# Patient Record
Sex: Female | Born: 1970 | Race: White | Hispanic: No | Marital: Married | Smoking: Never smoker
Health system: Southern US, Community
[De-identification: ages and names within clinical notes are randomized; demographics above are authoritative.]

## PROBLEM LIST (undated history)

## (undated) DIAGNOSIS — G629 Polyneuropathy, unspecified: Secondary | ICD-10-CM

## (undated) DIAGNOSIS — M719 Bursopathy, unspecified: Secondary | ICD-10-CM

## (undated) DIAGNOSIS — F329 Major depressive disorder, single episode, unspecified: Secondary | ICD-10-CM

## (undated) DIAGNOSIS — H3581 Retinal edema: Secondary | ICD-10-CM

## (undated) DIAGNOSIS — M479 Spondylosis, unspecified: Secondary | ICD-10-CM

## (undated) DIAGNOSIS — Z9109 Other allergy status, other than to drugs and biological substances: Secondary | ICD-10-CM

## (undated) DIAGNOSIS — R569 Unspecified convulsions: Secondary | ICD-10-CM

## (undated) DIAGNOSIS — M1990 Unspecified osteoarthritis, unspecified site: Secondary | ICD-10-CM

## (undated) DIAGNOSIS — M779 Enthesopathy, unspecified: Secondary | ICD-10-CM

## (undated) DIAGNOSIS — M51369 Other intervertebral disc degeneration, lumbar region without mention of lumbar back pain or lower extremity pain: Secondary | ICD-10-CM

## (undated) DIAGNOSIS — E7800 Pure hypercholesterolemia, unspecified: Secondary | ICD-10-CM

## (undated) DIAGNOSIS — E119 Type 2 diabetes mellitus without complications: Secondary | ICD-10-CM

## (undated) DIAGNOSIS — Z86018 Personal history of other benign neoplasm: Secondary | ICD-10-CM

## (undated) DIAGNOSIS — M5136 Other intervertebral disc degeneration, lumbar region: Secondary | ICD-10-CM

## (undated) DIAGNOSIS — J40 Bronchitis, not specified as acute or chronic: Secondary | ICD-10-CM

## (undated) DIAGNOSIS — M7580 Other shoulder lesions, unspecified shoulder: Secondary | ICD-10-CM

## (undated) DIAGNOSIS — G43909 Migraine, unspecified, not intractable, without status migrainosus: Secondary | ICD-10-CM

## (undated) DIAGNOSIS — I10 Essential (primary) hypertension: Secondary | ICD-10-CM

## (undated) DIAGNOSIS — M797 Fibromyalgia: Secondary | ICD-10-CM

## (undated) DIAGNOSIS — M5126 Other intervertebral disc displacement, lumbar region: Secondary | ICD-10-CM

## (undated) DIAGNOSIS — R000 Tachycardia, unspecified: Secondary | ICD-10-CM

## (undated) DIAGNOSIS — Z8742 Personal history of other diseases of the female genital tract: Secondary | ICD-10-CM

## (undated) DIAGNOSIS — H539 Unspecified visual disturbance: Secondary | ICD-10-CM

## (undated) DIAGNOSIS — F32A Depression, unspecified: Secondary | ICD-10-CM

## (undated) DIAGNOSIS — F419 Anxiety disorder, unspecified: Secondary | ICD-10-CM

## (undated) DIAGNOSIS — T8859XA Other complications of anesthesia, initial encounter: Secondary | ICD-10-CM

## (undated) DIAGNOSIS — I509 Heart failure, unspecified: Secondary | ICD-10-CM

## (undated) DIAGNOSIS — G589 Mononeuropathy, unspecified: Secondary | ICD-10-CM

## (undated) DIAGNOSIS — J189 Pneumonia, unspecified organism: Secondary | ICD-10-CM

## (undated) DIAGNOSIS — D649 Anemia, unspecified: Secondary | ICD-10-CM

## (undated) HISTORY — DX: Major depressive disorder, single episode, unspecified: F32.9

## (undated) HISTORY — PX: TUBAL LIGATION: SHX77

## (undated) HISTORY — DX: Migraine, unspecified, not intractable, without status migrainosus: G43.909

## (undated) HISTORY — DX: Anxiety disorder, unspecified: F41.9

## (undated) HISTORY — PX: NASAL SINUS SURGERY: SHX719

## (undated) HISTORY — DX: Retinal edema: H35.81

## (undated) HISTORY — DX: Essential (primary) hypertension: I10

## (undated) HISTORY — DX: Polyneuropathy, unspecified: G62.9

## (undated) HISTORY — PX: VAGINAL HYSTERECTOMY: SUR661

## (undated) HISTORY — DX: Other shoulder lesions, unspecified shoulder: M75.80

## (undated) HISTORY — DX: Type 2 diabetes mellitus without complications: E11.9

## (undated) HISTORY — DX: Heart failure, unspecified: I50.9

## (undated) HISTORY — DX: Depression, unspecified: F32.A

## (undated) HISTORY — DX: Pure hypercholesterolemia, unspecified: E78.00

---

## 1999-01-16 ENCOUNTER — Emergency Department (HOSPITAL_COMMUNITY): Admission: EM | Admit: 1999-01-16 | Discharge: 1999-01-16 | Payer: Self-pay | Admitting: Emergency Medicine

## 1999-01-16 ENCOUNTER — Encounter: Payer: Self-pay | Admitting: Emergency Medicine

## 2001-06-18 ENCOUNTER — Encounter: Payer: Self-pay | Admitting: Emergency Medicine

## 2001-06-18 ENCOUNTER — Emergency Department (HOSPITAL_COMMUNITY): Admission: EM | Admit: 2001-06-18 | Discharge: 2001-06-18 | Payer: Self-pay

## 2001-07-05 ENCOUNTER — Emergency Department (HOSPITAL_COMMUNITY): Admission: EM | Admit: 2001-07-05 | Discharge: 2001-07-05 | Payer: Self-pay | Admitting: Emergency Medicine

## 2001-07-05 ENCOUNTER — Encounter: Payer: Self-pay | Admitting: Emergency Medicine

## 2001-07-06 ENCOUNTER — Encounter: Payer: Self-pay | Admitting: Emergency Medicine

## 2001-11-19 ENCOUNTER — Emergency Department (HOSPITAL_COMMUNITY): Admission: EM | Admit: 2001-11-19 | Discharge: 2001-11-19 | Payer: Self-pay | Admitting: Emergency Medicine

## 2001-11-19 ENCOUNTER — Encounter: Payer: Self-pay | Admitting: Emergency Medicine

## 2002-02-24 ENCOUNTER — Emergency Department (HOSPITAL_COMMUNITY): Admission: EM | Admit: 2002-02-24 | Discharge: 2002-02-24 | Payer: Self-pay | Admitting: Emergency Medicine

## 2002-09-24 ENCOUNTER — Encounter: Payer: Self-pay | Admitting: Emergency Medicine

## 2002-09-24 ENCOUNTER — Emergency Department (HOSPITAL_COMMUNITY): Admission: EM | Admit: 2002-09-24 | Discharge: 2002-09-24 | Payer: Self-pay | Admitting: Emergency Medicine

## 2003-01-10 ENCOUNTER — Encounter
Admission: RE | Admit: 2003-01-10 | Discharge: 2003-04-10 | Payer: Self-pay | Admitting: Physical Medicine & Rehabilitation

## 2003-04-05 ENCOUNTER — Encounter
Admission: RE | Admit: 2003-04-05 | Discharge: 2003-07-04 | Payer: Self-pay | Admitting: Physical Medicine & Rehabilitation

## 2003-08-08 ENCOUNTER — Encounter
Admission: RE | Admit: 2003-08-08 | Discharge: 2003-11-06 | Payer: Self-pay | Admitting: Physical Medicine & Rehabilitation

## 2013-03-21 ENCOUNTER — Ambulatory Visit (INDEPENDENT_AMBULATORY_CARE_PROVIDER_SITE_OTHER): Payer: BC Managed Care – PPO | Admitting: Endocrinology

## 2013-03-21 ENCOUNTER — Ambulatory Visit: Payer: BC Managed Care – PPO | Admitting: Endocrinology

## 2013-03-21 VITALS — BP 130/70 | HR 78 | Ht 65.0 in | Wt 157.0 lb

## 2013-03-21 DIAGNOSIS — M545 Low back pain, unspecified: Secondary | ICD-10-CM

## 2013-03-21 DIAGNOSIS — G609 Hereditary and idiopathic neuropathy, unspecified: Secondary | ICD-10-CM

## 2013-03-21 DIAGNOSIS — E109 Type 1 diabetes mellitus without complications: Secondary | ICD-10-CM | POA: Insufficient documentation

## 2013-03-21 DIAGNOSIS — E785 Hyperlipidemia, unspecified: Secondary | ICD-10-CM

## 2013-03-21 DIAGNOSIS — M159 Polyosteoarthritis, unspecified: Secondary | ICD-10-CM

## 2013-03-21 DIAGNOSIS — D751 Secondary polycythemia: Secondary | ICD-10-CM

## 2013-03-21 DIAGNOSIS — E1039 Type 1 diabetes mellitus with other diabetic ophthalmic complication: Secondary | ICD-10-CM

## 2013-03-21 DIAGNOSIS — I10 Essential (primary) hypertension: Secondary | ICD-10-CM

## 2013-03-21 DIAGNOSIS — R809 Proteinuria, unspecified: Secondary | ICD-10-CM

## 2013-03-21 DIAGNOSIS — H3552 Pigmentary retinal dystrophy: Secondary | ICD-10-CM

## 2013-03-21 MED ORDER — INSULIN ASPART 100 UNIT/ML FLEXPEN: PEN_INJECTOR | SUBCUTANEOUS | Status: DC

## 2013-03-21 MED ORDER — INSULIN DETEMIR 100 UNIT/ML FLEXPEN: 40.0000 [IU] | PEN_INJECTOR | Freq: Every day | SUBCUTANEOUS | Status: DC

## 2013-03-21 NOTE — Patient Instructions (Addendum)
good diet and exercise habits significanly improve the control of your diabetes.  please let me know if you wish to be referred to a dietician.  high blood sugar is very risky to your health.  you should see an eye doctor every year.  You are at higher than average risk for pneumonia and hepatitis-B.  You should be vaccinated against both.   controlling your blood pressure and cholesterol drastically reduces the damage diabetes does to your body.  this also applies to quitting smoking.  please discuss these with your doctor.  check your blood sugar twice a day.  vary the time of day when you check, between before the 3 meals, and at bedtime.  also check if you have symptoms of your blood sugar being too high or too low.  please keep a record of the readings and bring it to your next appointment here.  please call us sooner if your blood sugar goes below 70, or if you have a lot of readings over 200.   Please stop the victoza. Please take the insulin numbers noted below.   Please come back for a follow-up appointment in 2 weeks.

## 2013-03-21 NOTE — Progress Notes (Signed)
  Subjective:    Patient ID: Amanda Frederick, female    DOB: [birth year], 42 y.o.   MRN: [id number]  HPI pt states 26 years h/o dm.  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  she has been on insulin x 22 years.  pt says her diet is good, but exercise is poor.  She developed a rash after starting lantus and humalog.  It resolved when she stopped these.  However, these are preferred on her insurance.  She says she has intermittent mild hypoglycemia after taking novolog 15 units with breakfast or lunch.  It is highest at hs.  No past medical history on file.  No past surgical history on file.  History   Social History  . Marital Status: Divorced    Spouse Name: N/A    Number of Children: N/A  . Years of Education: N/A   Occupational History  . Not on file.   Social History Main Topics  . Smoking status: Not on file  . Smokeless tobacco: Not on file  . Alcohol Use: Not on file  . Drug Use: Not on file  . Sexual Activity: Not on file   Other Topics Concern  . Not on file   Social History Narrative  . No narrative on file    No current outpatient prescriptions on file prior to visit.   No current facility-administered medications on file prior to visit.    Not on File  No family history on file. DM: mother BP 130/70  Pulse 78  Ht 5\' 5"  (1.651 m)  Wt 157 lb (71.215 kg)  BMI 26.13 kg/m2  SpO2 97%  Review of Systems denies weight loss, sob, n/v, memory loss, menopausal sxs, rhinorrhea, and easy bruising.  Her vision is poor.  She has headache, excessive diaphoresis, muscle cramps, depression, and urinary frequency.  Dr Tomasa Blase recently evaluated her for chest pain, and determined it to be noncardiogenic.  She has decreased appetite since on victoza (last week).    Objective:   Physical Exam VS: see vs page GEN: no distress HEAD: head: no deformity eyes: no periorbital swelling, no proptosis external nose and ears are  normal mouth: no lesion seen NECK: supple, thyroid is not enlarged CHEST WALL: no deformity LUNGS:  Clear to auscultation CV: reg rate and rhythm, no murmur ABD: abdomen is soft, nontender.  no hepatosplenomegaly.  not distended.  no hernia MUSCULOSKELETAL: muscle bulk and strength are grossly normal.  no obvious joint swelling.  gait is normal and steady PULSES: no carotid bruit NEURO:  cn 2-12 grossly intact.   readily moves all 4's.   SKIN:  Normal texture and temperature.  No rash or suspicious lesion is visible.   NODES:  None palpable at the neck.  PSYCH: alert, oriented x3.  Does not appear anxious nor depressed.   outside test results are reviewed: A1c=12%    Assessment & Plan:  DM: i agree with dr Tomasa Blase that she has a very high risk to her health from this a1c. Rash: this is usually a coincidence, as it is a rare side effect of insulin.  We discussed retrial of lantus+humalog, but pt says she wants to stay with levemir+novolog for now.  Decreased appetite.  Pt says she wants to go off victoza, so that is ok with me.   Visual loss.  This limits the rx of DM.

## 2013-03-24 ENCOUNTER — Telehealth: Payer: Self-pay | Admitting: Endocrinology

## 2013-03-24 MED ORDER — INSULIN LISPRO 100 UNIT/ML ~~LOC~~ SOLN: SUBCUTANEOUS | Status: DC

## 2013-03-24 NOTE — Telephone Encounter (Signed)
i have changed novolog pens to humalog vials, per insurance. i need prior auth form for levemir.

## 2013-03-25 ENCOUNTER — Other Ambulatory Visit: Payer: Self-pay

## 2013-03-25 NOTE — Telephone Encounter (Signed)
Form on your desk  

## 2013-04-04 ENCOUNTER — Ambulatory Visit (INDEPENDENT_AMBULATORY_CARE_PROVIDER_SITE_OTHER): Payer: BC Managed Care – PPO | Admitting: Endocrinology

## 2013-04-04 VITALS — BP 134/80 | HR 110 | Ht 65.0 in | Wt 160.0 lb

## 2013-04-04 DIAGNOSIS — G619 Inflammatory polyneuropathy, unspecified: Secondary | ICD-10-CM | POA: Insufficient documentation

## 2013-04-04 DIAGNOSIS — E1039 Type 1 diabetes mellitus with other diabetic ophthalmic complication: Secondary | ICD-10-CM

## 2013-04-04 DIAGNOSIS — G622 Polyneuropathy due to other toxic agents: Secondary | ICD-10-CM | POA: Insufficient documentation

## 2013-04-04 NOTE — Patient Instructions (Addendum)
check your blood sugar 4 times a day.  vary the time of day when you check, between before the 3 meals, and at bedtime.  also check if you have symptoms of your blood sugar being too high or too low.  please keep a record of the readings and bring it to your next appointment here.  please call us sooner if your blood sugar goes below 70, or if you have a lot of readings over 200.   Please come back for a follow-up appointment in 2-3 weeks.   Please reduce the levemir to 35 units at bedtime, and: Change the novolog to 3 times a day (just before each meal) 15-15-20 units.   Dr Tomasa Blase or i would be happy to request for you a special kind of x-ray, to see if you have "gastroparesis."   Refer to a neurology specialist.  you will receive a phone call, about a day and time for an appointment.

## 2013-04-04 NOTE — Progress Notes (Signed)
  Subjective:    Patient ID: Amanda Frederick, female    DOB: Jan 11, 1971, 42 y.o.   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  she has been on insulin since 1992.  She developed a rash after starting lantus and humalog.  It resolved when she stopped these.  However, these are preferred on her insurance. she brings a record of her cbg's which i have reviewed today.  It varies from 70-300.   It is in general lowest at hs, and in am.  pt states she feels well in general. No past medical history on file.  No past surgical history on file.  History   Social History  . Marital Status: Divorced    Spouse Name: N/A    Number of Children: N/A  . Years of Education: N/A   Occupational History  . Not on file.   Social History Main Topics  . Smoking status: Not on file  . Smokeless tobacco: Not on file  . Alcohol Use: Not on file  . Drug Use: Not on file  . Sexual Activity: Not on file   Other Topics Concern  . Not on file   Social History Narrative  . No narrative on file    Current Outpatient Prescriptions on File Prior to Visit  Medication Sig Dispense Refill  . fish oil-omega-3 fatty acids 1000 MG capsule Take 2 g by mouth daily.      Marland Kitchen lisinopril (PRINIVIL,ZESTRIL) 10 MG tablet Take 10 mg by mouth daily.       No current facility-administered medications on file prior to visit.   Not on File  No family history on file.  BP 134/80  Pulse 110  Ht 5\' 5"  (1.651 m)  Wt 160 lb (72.576 kg)  BMI 26.63 kg/m2  SpO2 98%  Review of Systems Denies LOC and weight change.     Objective:   Physical Exam VITAL SIGNS:  See vs page GENERAL: no distress SKIN:  Insulin injection sites at the anterior abdomen are normal, except for a few ecchymoses.       Assessment & Plan:  DM: Based on the pattern of her cbg's, she needs some adjustment in her therapy Nausea: possibly due  to gastroparesis Peripheral painful neuropathy: this limits exercise of DM.

## 2013-04-18 ENCOUNTER — Ambulatory Visit (INDEPENDENT_AMBULATORY_CARE_PROVIDER_SITE_OTHER): Payer: BC Managed Care – PPO | Admitting: Endocrinology

## 2013-04-18 VITALS — BP 126/74 | HR 78 | Wt 163.0 lb

## 2013-04-18 DIAGNOSIS — E1039 Type 1 diabetes mellitus with other diabetic ophthalmic complication: Secondary | ICD-10-CM

## 2013-04-18 NOTE — Patient Instructions (Addendum)
check your blood sugar 4 times a day.  vary the time of day when you check, between before the 3 meals, and at bedtime.  also check if you have symptoms of your blood sugar being too high or too low.  please keep a record of the readings and bring it to your next appointment here.  please call us sooner if your blood sugar goes below 70, or if you have a lot of readings over 200.   Please come back for a follow-up appointment in 6 weeks.  Please continue the levemir, 35 units at bedtime, and:  Change the novolog to 3 times a day (just before each meal) 15-20-25 units.  Also, take 5 extra units whenever your blood sugar is over 250.

## 2013-04-18 NOTE — Progress Notes (Signed)
  Subjective:    Patient ID: Amanda Frederick, female    DOB: 09-Sep-1970, 42 y.o.   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 she brings a record of her cbg's which i have reviewed today.  It varies from 180-300.  It is in general higher as the day goes on.  pt states she feels well in general. No past medical history on file.  No past surgical history on file.  History   Social History  . Marital Status: Divorced    Spouse Name: N/A    Number of Children: N/A  . Years of Education: N/A   Occupational History  . Not on file.   Social History Main Topics  . Smoking status: Not on file  . Smokeless tobacco: Not on file  . Alcohol Use: Not on file  . Drug Use: Not on file  . Sexual Activity: Not on file   Other Topics Concern  . Not on file   Social History Narrative  . No narrative on file    Current Outpatient Prescriptions on File Prior to Visit  Medication Sig Dispense Refill  . fenofibrate 160 MG tablet Take 160 mg by mouth daily.      . fish oil-omega-3 fatty acids 1000 MG capsule Take 2 g by mouth daily.      . Insulin Detemir 100 UNIT/ML SOPN Inject 35 Units into the skin at bedtime.      Marland Kitchen lisinopril (PRINIVIL,ZESTRIL) 10 MG tablet Take 10 mg by mouth daily.       No current facility-administered medications on file prior to visit.    Not on File  No family history on file.  BP 126/74  Pulse 78  Wt 163 lb (73.936 kg)  BMI 27.12 kg/m2  SpO2 98%  Review of Systems denies hypoglycemia and weight change.      Objective:   Physical Exam VITAL SIGNS:  See vs page.   GENERAL: no distress. PSYCH: Alert and oriented x 3.  Does not appear anxious nor depressed.      Assessment & Plan:  DM:  Based on the pattern of her cbg's, she needs some adjustment in her therapy.  This insulin regimen was chosen from multiple options, as it best matches her insulin to her changing requirements throughout the day.  The benefits of glycemic control must be weighed against the risks of hypoglycemia.  She may need a simpler insulin regimen.   Rash: this limits her insulin options. Painful neuropathy: this limits exercise rx of DM.

## 2013-04-21 ENCOUNTER — Other Ambulatory Visit: Payer: Self-pay | Admitting: Endocrinology

## 2013-04-21 DIAGNOSIS — E1039 Type 1 diabetes mellitus with other diabetic ophthalmic complication: Secondary | ICD-10-CM

## 2013-04-22 ENCOUNTER — Other Ambulatory Visit: Payer: Self-pay | Admitting: Endocrinology

## 2013-04-22 ENCOUNTER — Telehealth: Payer: Self-pay | Admitting: Endocrinology

## 2013-04-22 ENCOUNTER — Ambulatory Visit (INDEPENDENT_AMBULATORY_CARE_PROVIDER_SITE_OTHER): Payer: BC Managed Care – PPO | Admitting: Neurology

## 2013-04-22 ENCOUNTER — Encounter: Payer: Self-pay | Admitting: Neurology

## 2013-04-22 VITALS — BP 124/84 | HR 106 | Ht 66.0 in | Wt 160.0 lb

## 2013-04-22 DIAGNOSIS — F32A Depression, unspecified: Secondary | ICD-10-CM | POA: Insufficient documentation

## 2013-04-22 DIAGNOSIS — F3289 Other specified depressive episodes: Secondary | ICD-10-CM

## 2013-04-22 DIAGNOSIS — M542 Cervicalgia: Secondary | ICD-10-CM

## 2013-04-22 DIAGNOSIS — G619 Inflammatory polyneuropathy, unspecified: Secondary | ICD-10-CM

## 2013-04-22 DIAGNOSIS — F329 Major depressive disorder, single episode, unspecified: Secondary | ICD-10-CM

## 2013-04-22 DIAGNOSIS — E1039 Type 1 diabetes mellitus with other diabetic ophthalmic complication: Secondary | ICD-10-CM

## 2013-04-22 DIAGNOSIS — E119 Type 2 diabetes mellitus without complications: Secondary | ICD-10-CM

## 2013-04-22 DIAGNOSIS — G609 Hereditary and idiopathic neuropathy, unspecified: Secondary | ICD-10-CM

## 2013-04-22 DIAGNOSIS — I10 Essential (primary) hypertension: Secondary | ICD-10-CM

## 2013-04-22 DIAGNOSIS — E7800 Pure hypercholesterolemia, unspecified: Secondary | ICD-10-CM

## 2013-04-22 DIAGNOSIS — G622 Polyneuropathy due to other toxic agents: Secondary | ICD-10-CM

## 2013-04-22 MED ORDER — DULOXETINE HCL 60 MG PO CPEP: 60.0000 mg | ORAL_CAPSULE | Freq: Every day | ORAL | Status: DC

## 2013-04-22 NOTE — Telephone Encounter (Signed)
please call patient: i have received the form for pump.   We need fasting blood tests for this. Please come in to the office here to have drawn.

## 2013-04-22 NOTE — Progress Notes (Signed)
[facility]  PATIENT: Amanda Frederick DOB: 10-16-1970  HISTORICAL  Amanda Frederick is a 42 years old right-handed Caucasian female, accompanied by her mother, referred by her primary care physician Amanda Frederick for evaluation of peripheral neuropathy, chronic neck pain  She had past medical history of insulin-dependent diabetes since age 14, also had a history of hypertension, gastroparesis, diabetic peripheral neuropathy  She suffered a fall in 2002, fell backwards, landed on her occipital area, with transient loss of consciousness, broke her right hip, ever since the accident, she has constant neck pain, radiating pain to bilateral shoulder, frequent headaches, starting from upper cervical region, spreading forward vortex  In 2011, she had worsening above symptoms, was evaluated by Amanda Frederick neurologic, reported abnormal MRI of cervical, had epidural injections failed to respond, physical therapy ws only minimum helpful, she was offered surgery, but with potential guarded outcome,, and complications, she elected not to have surgery  Over the past 3 years, she has worsening neck pain, radiating pain to bilateral shoulder, upper extremities, especially middle fingers, numbness, difficulty raising left arm overhead, gait difficulty, bilateral feet burning discomfort, difficulty sleeping  She was put on Lyrica 100 mg twice a day about a week ago, with mild improvement of her symptoms, she complains fatigue sleepiness with the medication,   REVIEW OF SYSTEMS: Full 14 system review of systems performed and notable only for  fever, chills, fatigue, chest pain, spinning sensation, blurred vision, double vision, diarrhea, constipation, feeling hot, increased thirst, flushing, joint pain, sweating, achy muscles, energy, frequent infection, headache, numbness, weakness, dizziness, depression, decreased energy, insomnia, sleepiness, restless leg, decreased energy, disinterested in activities,  racing thoughts,  ALLERGIES: No Known Allergies  HOME MEDICATIONS: Outpatient Prescriptions Prior to Visit  Medication Sig Dispense Refill  . cholecalciferol (VITAMIN D) 1000 UNITS tablet Take 4,000 Units by mouth daily.      . fenofibrate 160 MG tablet Take 160 mg by mouth daily.      . fish oil-omega-3 fatty acids 1000 MG capsule Take 2 g by mouth daily.      . Insulin Aspart (NOVOLOG FLEXPEN Amanda Frederick) Inject into the skin. 3 times a day (just before each meal) 15-20-25 units, and syringes 4/day      . Insulin Detemir 100 UNIT/ML SOPN Inject 35 Units into the skin at bedtime.      Marland Kitchen lisinopril (PRINIVIL,ZESTRIL) 10 MG tablet Take 10 mg by mouth daily.      Marland Kitchen lubiprostone (AMITIZA) 8 MCG capsule Take 8 mcg by mouth 2 (two) times daily with a meal.      . pregabalin (LYRICA) 100 MG capsule Take 100 mg by mouth 2 (two) times daily.       No facility-administered medications prior to visit.    PAST MEDICAL HISTORY: Past Medical History  Diagnosis Date  . High blood pressure   . Diabetes   . High cholesterol   . Neuropathy     eyes , hands and feet   . Migraines   . Anxiety   . Depression   . AC (acromioclavicular) joint bone spurs     PAST SURGICAL HISTORY: Past Surgical History  Procedure Laterality Date  . Vaginal hysterectomy    . Tubal ligation    . Nasal sinus surgery      FAMILY HISTORY: Family History  Problem Relation Age of Onset  . Anemia Mother   . High Cholesterol Mother   . Diabetes Mother   . Arthritis Mother     SOCIAL HISTORY:  History   Social History  . Marital Status: Divorced    Spouse Name: Amanda Frederick    Number of Children: 3  . Years of Education: college   Occupational History  .      Home maker   Social History Main Topics  . Smoking status: Never Smoker   . Smokeless tobacco: Never Used  . Alcohol Use: No  . Drug Use: No  . Sexual Activity: Not on file   Other Topics Concern  . Not on file   Social History Narrative   Patient  lives at home with her husband Amanda Frederick. 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 Patient drinks 4-6 caffeine drinks daily.   Right handed.           PHYSICAL EXAM   Filed Vitals:   04/22/13 1451  BP: 124/84  Pulse: 106  Height: 5\' 6"  (1.676 m)  Weight: 160 lb (72.576 kg)     Body mass index is 25.84 kg/(m^2).   Generalized: In no acute distress  Neck: Supple, no carotid bruits   Cardiac: Regular rate rhythm  Pulmonary: Clear to auscultation bilaterally  Musculoskeletal: No deformity  Neurological examination  Mentation: Alert oriented to time, place, history taking, and causual conversation  Cranial nerve II-XII: Pupils were equal round reactive to light extraocular movements were full, visual field were full on confrontational test. facial sensation and strength were normal. hearing was intact to finger rubbing bilaterally. Uvula tongue midline.  head turning and shoulder shrug and were normal and symmetric.Tongue protrusion into cheek strength was normal.  Motor: normal tone, bulk and strength, with exception of mild left shoulder abduction, external rotation weakness.  Sensory: length dependent decreased to fine touch, pinprick at ankle level, preserved vibratory sensation, and proprioception at toes.  Coordination: Normal finger to nose, heel-to-shin bilaterally there was no truncal ataxia  Gait: Rising up from seated position without assistance, cautious mild ataglic   Romberg signs: Negative  Deep tendon reflexes: Brachioradialis 2/2, biceps 2/2, triceps 2/2, patellar 2/2, Achilles 1/1, plantar responses were flexor bilaterally.   DIAGNOSTIC DATA (LABS, IMAGING, TESTING) - I reviewed patient records, labs, notes, testing and imaging myself where available.     ASSESSMENT AND PLAN   42 years old Caucasian female, with a long-standing history of type I insulin-dependent diabetes, previous concussion, chronic neck pain, radiating pain to  bilateral shoulder, upper extremity,  1. differentiation diagnosis, cervical radiculopathy, peripheral neuropathy, need to rule out cervical spondylitic myelopathy 2 complete evaluation with MRI of cervical 3 EMG nerve conduction study.   4. Add on Cymbalta 60 mg every night    Levert Feinstein, M.D. Ph.D.  [facility] [address] [place], [place] [address] [phone]

## 2013-04-22 NOTE — Telephone Encounter (Signed)
Left message

## 2013-04-25 ENCOUNTER — Other Ambulatory Visit: Payer: BC Managed Care – PPO

## 2013-04-25 ENCOUNTER — Telehealth: Payer: Self-pay

## 2013-04-25 NOTE — Telephone Encounter (Signed)
Patient called requesting Xanax for the MRI she has scheduled.  Okay to dispense?  Please advise.  Thank you.

## 2013-04-26 ENCOUNTER — Other Ambulatory Visit: Payer: BC Managed Care – PPO

## 2013-04-26 NOTE — Telephone Encounter (Signed)
Per Andrey Campanile, Dr Lorrin Goodell Xanax.  [facility] dispensed Alprazolam 0.5mg  tabs #3 Lot [id number] Exp 04/2014

## 2013-04-27 ENCOUNTER — Ambulatory Visit (INDEPENDENT_AMBULATORY_CARE_PROVIDER_SITE_OTHER): Payer: BC Managed Care – PPO

## 2013-04-27 DIAGNOSIS — M542 Cervicalgia: Secondary | ICD-10-CM

## 2013-04-27 DIAGNOSIS — E119 Type 2 diabetes mellitus without complications: Secondary | ICD-10-CM

## 2013-04-27 DIAGNOSIS — E1039 Type 1 diabetes mellitus with other diabetic ophthalmic complication: Secondary | ICD-10-CM

## 2013-04-27 DIAGNOSIS — F32A Depression, unspecified: Secondary | ICD-10-CM

## 2013-04-27 DIAGNOSIS — F329 Major depressive disorder, single episode, unspecified: Secondary | ICD-10-CM

## 2013-04-27 DIAGNOSIS — E7800 Pure hypercholesterolemia, unspecified: Secondary | ICD-10-CM

## 2013-04-27 DIAGNOSIS — I10 Essential (primary) hypertension: Secondary | ICD-10-CM

## 2013-04-27 DIAGNOSIS — G609 Hereditary and idiopathic neuropathy, unspecified: Secondary | ICD-10-CM

## 2013-04-28 ENCOUNTER — Telehealth: Payer: Self-pay | Admitting: *Deleted

## 2013-04-28 NOTE — Telephone Encounter (Signed)
Wilkie Aye from [employer] called requesting the [employer] forms that were faxed in for pt. Return call to #[phone], ext. [phone].

## 2013-04-29 NOTE — Progress Notes (Signed)
Quick Note:  MRI cervical showed multiple degenerative disc disease, will review film on her EMG visit in Oct 13th 2014. ______

## 2013-04-29 NOTE — Telephone Encounter (Signed)
Left message to refax form for pt

## 2013-05-05 ENCOUNTER — Telehealth: Payer: Self-pay | Admitting: Endocrinology

## 2013-05-05 ENCOUNTER — Telehealth: Payer: Self-pay | Admitting: Neurology

## 2013-05-05 NOTE — Telephone Encounter (Signed)
Pt advised.

## 2013-05-05 NOTE — Telephone Encounter (Signed)
please call patient: Please come in for fasting labs, so i can complete form

## 2013-05-06 ENCOUNTER — Telehealth: Payer: Self-pay | Admitting: Neurology

## 2013-05-06 NOTE — Telephone Encounter (Signed)
Pt calling for MRI results

## 2013-05-06 NOTE — Telephone Encounter (Signed)
I have called Amanda Frederick,   MRI cervical spine (without) demonstrating:  1. At C5-6: disc bulging with mild spinal stenosis and severe biforaminal foraminal stenosis  2. At C3-4: right uncovertebral joint hypertrophy, right disc bulging, with severe right foraminal stenosis.  3. C5 vertebral body has decreased T1 signal, increased STIR signal, may represent marrow edema, due to degenerative or post-traumatic etiologies. No loss of vertebral body height.   She has EMG/NCS in Oct 13th, will go over test result with her then.

## 2013-05-09 ENCOUNTER — Encounter (INDEPENDENT_AMBULATORY_CARE_PROVIDER_SITE_OTHER): Payer: Self-pay

## 2013-05-09 ENCOUNTER — Ambulatory Visit (INDEPENDENT_AMBULATORY_CARE_PROVIDER_SITE_OTHER): Payer: BC Managed Care – PPO | Admitting: Neurology

## 2013-05-09 DIAGNOSIS — F329 Major depressive disorder, single episode, unspecified: Secondary | ICD-10-CM

## 2013-05-09 DIAGNOSIS — G609 Hereditary and idiopathic neuropathy, unspecified: Secondary | ICD-10-CM

## 2013-05-09 DIAGNOSIS — E1039 Type 1 diabetes mellitus with other diabetic ophthalmic complication: Secondary | ICD-10-CM

## 2013-05-09 DIAGNOSIS — E785 Hyperlipidemia, unspecified: Secondary | ICD-10-CM

## 2013-05-09 DIAGNOSIS — I10 Essential (primary) hypertension: Secondary | ICD-10-CM

## 2013-05-09 DIAGNOSIS — M542 Cervicalgia: Secondary | ICD-10-CM

## 2013-05-09 DIAGNOSIS — E119 Type 2 diabetes mellitus without complications: Secondary | ICD-10-CM

## 2013-05-09 DIAGNOSIS — F32A Depression, unspecified: Secondary | ICD-10-CM

## 2013-05-09 DIAGNOSIS — E7800 Pure hypercholesterolemia, unspecified: Secondary | ICD-10-CM

## 2013-05-09 DIAGNOSIS — G622 Polyneuropathy due to other toxic agents: Secondary | ICD-10-CM

## 2013-05-09 DIAGNOSIS — G619 Inflammatory polyneuropathy, unspecified: Secondary | ICD-10-CM

## 2013-05-09 DIAGNOSIS — Z0289 Encounter for other administrative examinations: Secondary | ICD-10-CM

## 2013-05-09 MED ORDER — MELOXICAM 7.5 MG PO TABS: 7.5000 mg | ORAL_TABLET | Freq: Every day | ORAL | Status: DC

## 2013-05-09 NOTE — Addendum Note (Signed)
Addended by: Levert Feinstein on: 05/09/2013 03:29 PM   Modules accepted: Level of Service

## 2013-05-09 NOTE — Procedures (Signed)
    [facility]  NCS (NERVE CONDUCTION STUDY) WITH EMG (ELECTROMYOGRAPHY) REPORT   STUDY DATE:  05/09/2013 PATIENT NAME: Amanda Frederick DOB: Dec 25, 1970 MRN: [id number]    TECHNOLOGIST: Gearldine Shown ELECTROMYOGRAPHER: Levert Feinstein M.D.  CLINICAL INFORMATION:  42 years old Caucasian female, with neck pain, radiating pain to bilateral shoulder, left worse than right, subjective left-sided weakness,  On examination: She has left shoulder tenderness upon deep palpation, deep tendon reflexes were brisk and symmetric.   FINDINGS: NERVE CONDUCTION STUDY: Bilateral median, ulnar sensory and motor responses were normal  NEEDLE ELECTROMYOGRAPHY: Selected needle examination was performed at left upper extremity muscles,  and the left cervical paraspinal muscles.  Needle examination of left first dorsal interossei, pronator teres, extensor digitorum communis, biceps, triceps, deltoid was normal.  There was no spontaneous activity at the left cervical paraspinal muscles, left C5, C6, C7.  IMPRESSION:   This is a normal study. There is no electrodiagnostic evidence of left upper extremity neuropathy, or left cervical radiculopathy.    INTERPRETING PHYSICIAN:   Levert Feinstein M.D. Ph.D. [facility] [address] [place], [place] [address] [phone]

## 2013-05-09 NOTE — Progress Notes (Addendum)
[facility]  PATIENT: Amanda Frederick DOB: 05-02-1971  HISTORICAL  Arlet is a 42 years old right-handed Caucasian female, accompanied by her mother, referred by her primary care physician Dr.Shults for evaluation of peripheral neuropathy, chronic neck pain  She had past medical history of insulin-dependent diabetes since age 40, also had a history of hypertension, gastroparesis, diabetic peripheral neuropathy  She suffered a fall in 2002, fell backwards, landed on her occipital area, with transient loss of consciousness, broke her right hip, ever since the accident, she has constant neck pain, radiating pain to bilateral shoulder, frequent headaches, starting from upper cervical region, spreading forward vortex  In 2011, she had worsening above symptoms, was evaluated by [facility] neurologic, reported abnormal MRI of cervical, had epidural injections failed to respond, physical therapy ws only minimum helpful, she was offered surgery, but with potential guarded outcome,, and complications, she elected not to have surgery  Over the past 3 years, she has worsening neck pain, radiating pain to bilateral shoulder, upper extremities, especially middle fingers, numbness, difficulty raising left arm overhead, gait difficulty, bilateral feet burning discomfort, difficulty sleeping  She was put on Lyrica 100 mg twice a day about a week ago, with mild improvement of her symptoms, she complains fatigue sleepiness with the medication  UPDATE Oct 13th 2014: Patient's return to electrodiagnostic study today, which was essentially normal, there was no evidence of left upper extremity neuropathy, left cervical radiculopathy I have reviewed MRI cervical spine with her,  MRI cervical spine (without) demonstrating: C5-6: disc bulging with mild spinal stenosis and severe biforaminal foraminal stenosis. C3-4: right uncovertebral joint hypertrophy, right disc bulging, with severe right  foraminal stenosis. C5 vertebral body has decreased T1 signal, increased STIR signal, may represent marrow edema, due to degenerative or post-traumatic etiologies. No loss of vertebral body height.   She continues to complain neck pain, radiating pain to bilateral shoulder, she has no significant upper extremity weakness, she failed to response to Lyrica and Cymbalta treatment,    REVIEW OF SYSTEMS: Full 14 system review of systems performed and notable only for  fever, chills, fatigue, chest pain, spinning sensation, blurred vision, double vision, diarrhea, constipation, feeling hot, increased thirst, flushing, joint pain, sweating, achy muscles, energy, frequent infection, headache, numbness, weakness, dizziness, depression, decreased energy, insomnia, sleepiness, restless leg, decreased energy, disinterested in activities, racing thoughts,  ALLERGIES: No Known Allergies  HOME MEDICATIONS: Outpatient Prescriptions Prior to Visit  Medication Sig Dispense Refill  . cholecalciferol (VITAMIN D) 1000 UNITS tablet Take 4,000 Units by mouth daily.      . DULoxetine (CYMBALTA) 60 MG capsule Take 1 capsule (60 mg total) by mouth daily.  30 capsule  12  . fenofibrate 160 MG tablet Take 160 mg by mouth daily.      . fish oil-omega-3 fatty acids 1000 MG capsule Take 2 g by mouth daily.      . Insulin Aspart (NOVOLOG FLEXPEN [place]) Inject into the skin. 3 times a day (just before each meal) 15-20-25 units, and syringes 4/day      . Insulin Detemir 100 UNIT/ML SOPN Inject 35 Units into the skin at bedtime.      Marland Kitchen lisinopril (PRINIVIL,ZESTRIL) 10 MG tablet Take 10 mg by mouth daily.      Marland Kitchen lubiprostone (AMITIZA) 8 MCG capsule Take 8 mcg by mouth 2 (two) times daily with a meal.      . pregabalin (LYRICA) 100 MG capsule Take 100 mg by mouth 2 (two) times daily.  No facility-administered medications prior to visit.    PAST MEDICAL HISTORY: Past Medical History  Diagnosis Date  . High blood pressure    . Diabetes   . High cholesterol   . Neuropathy     eyes , hands and feet   . Migraines   . Anxiety   . Depression   . AC (acromioclavicular) joint bone spurs     PAST SURGICAL HISTORY: Past Surgical History  Procedure Laterality Date  . Vaginal hysterectomy    . Tubal ligation    . Nasal sinus surgery      FAMILY HISTORY: Family History  Problem Relation Age of Onset  . Anemia Mother   . High Cholesterol Mother   . Diabetes Mother   . Arthritis Mother     SOCIAL HISTORY:  History   Social History  . Marital Status: Divorced    Spouse Name: Leory Plowman    Number of Children: 3  . Years of Education: college   Occupational History  .      Home maker   Social History Main Topics  . Smoking status: Never Smoker   . Smokeless tobacco: Never Used  . Alcohol Use: No  . Drug Use: No  . Sexual Activity: Not on file   Other Topics Concern  . Not on file   Social History Narrative   Patient lives at home with her husband Leory Plowman. 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 Patient drinks 4-6 caffeine drinks daily.   Right handed.           PHYSICAL EXAM  Generalized: In no acute distress  Neck: Supple, no carotid bruits   Cardiac: Regular rate rhythm  Pulmonary: Clear to auscultation bilaterally  Musculoskeletal: No deformity  Neurological examination  Mentation: Alert oriented to time, place, history taking, and causual conversation  Cranial nerve II-XII: Pupils were equal round reactive to light extraocular movements were full, visual field were full on confrontational test. facial sensation and strength were normal. hearing was intact to finger rubbing bilaterally. Uvula tongue midline.  head turning and shoulder shrug and were normal and symmetric.Tongue protrusion into cheek strength was normal.  Motor: normal tone, bulk and strength, with exception of mild left shoulder abduction, external rotation weakness.  Sensory: length dependent  decreased to fine touch, pinprick at ankle level, preserved vibratory sensation, and proprioception at toes.  Coordination: Normal finger to nose, heel-to-shin bilaterally there was no truncal ataxia  Gait: Rising up from seated position without assistance, cautious mild ataglic   Romberg signs: Negative  Deep tendon reflexes: Brachioradialis 2/2, biceps 2/2, triceps 2/2, patellar 2/2, Achilles 1/1, plantar responses were flexor bilaterally.   DIAGNOSTIC DATA (LABS, IMAGING, TESTING) - I reviewed patient records, labs, notes, testing and imaging myself where available.     ASSESSMENT AND PLAN   42 years old Caucasian female, with a long-standing history of type I insulin-dependent diabetes, previous concussion, chronic neck pain, radiating pain to bilateral shoulder, upper extremity,  1. There is no electrodiagnostic evidence of active cervical radiculopathy, 2. However there is evidence of cervical spondylitic disease, we will proceed with pain management at [facility], I will also add on mobic 7.5 mg as needed  3. RTC in 6 months     Levert Feinstein, M.D. Ph.D.  [facility] [address], [address] [place], [place] [address] [phone]

## 2013-05-10 ENCOUNTER — Telehealth: Payer: Self-pay | Admitting: Neurology

## 2013-05-12 ENCOUNTER — Other Ambulatory Visit: Payer: Self-pay

## 2013-05-12 ENCOUNTER — Other Ambulatory Visit (INDEPENDENT_AMBULATORY_CARE_PROVIDER_SITE_OTHER): Payer: BC Managed Care – PPO

## 2013-05-12 DIAGNOSIS — E1059 Type 1 diabetes mellitus with other circulatory complications: Secondary | ICD-10-CM

## 2013-05-12 LAB — BASIC METABOLIC PANEL
BUN: 15 mg/dL (ref 6–23)
CO2: 26 mEq/L (ref 19–32)
Calcium: 9 mg/dL (ref 8.4–10.5)
Chloride: 104 mEq/L (ref 96–112)
Creatinine, Ser: 0.7 mg/dL (ref 0.4–1.2)
GFR: 92.54 mL/min (ref >60.00)
Glucose, Bld: 182 mg/dL — ABNORMAL HIGH (ref 70–99)
Potassium: 4.1 mEq/L (ref 3.5–5.1)
Sodium: 140 mEq/L (ref 135–145)

## 2013-05-13 LAB — C-PEPTIDE: C-Peptide: 1.07 ng/mL (ref 0.80–3.90)

## 2013-05-17 LAB — ANTI-ISLET CELL ANTIBODY: Pancreatic Islet Cell Antibody: <5 JDF Units (ref <5)

## 2013-05-30 ENCOUNTER — Encounter: Payer: Self-pay | Admitting: Nutrition

## 2013-05-30 ENCOUNTER — Encounter: Payer: Self-pay | Admitting: Endocrinology

## 2013-05-30 ENCOUNTER — Ambulatory Visit (INDEPENDENT_AMBULATORY_CARE_PROVIDER_SITE_OTHER): Payer: BC Managed Care – PPO | Admitting: Endocrinology

## 2013-05-30 ENCOUNTER — Encounter: Payer: BC Managed Care – PPO | Attending: Endocrinology | Admitting: Nutrition

## 2013-05-30 VITALS — BP 136/92 | HR 97 | Temp 98.6°F | Resp 12 | Ht 66.0 in | Wt 166.8 lb

## 2013-05-30 DIAGNOSIS — E1039 Type 1 diabetes mellitus with other diabetic ophthalmic complication: Secondary | ICD-10-CM

## 2013-05-30 DIAGNOSIS — E119 Type 2 diabetes mellitus without complications: Secondary | ICD-10-CM

## 2013-05-30 LAB — HEMOGLOBIN A1C: Hgb A1c MFr Bld: 7.2 % — ABNORMAL HIGH (ref 4.6–6.5)

## 2013-05-30 NOTE — Progress Notes (Signed)
Subjective:    Patient ID: Amanda Frederick, female    DOB: July 11, 1971, 42 y.o.   MRN: [id number]  HPI pt returns for f/u of type 1 DM (dx'ed 1988, on a routine blood test; she has moderate sensory neuropathy of the lower extremities, and associated retinopathy; she has been on insulin since 1992; she developed a rash after starting lantus and humalog (preferred on her insurance) ; it resolved when she stopped these).  no cbg record, but states cbg's vary widely (32 to 300).  There is no trend throughout the day.  She has received the pump by mail.  She has an appointment a DM educator today.  Past Medical History  Diagnosis Date  . High blood pressure   . Diabetes   . High cholesterol   . Neuropathy     eyes , hands and feet   . Migraines   . Anxiety   . Depression   . AC (acromioclavicular) joint bone spurs     Past Surgical History  Procedure Laterality Date  . Vaginal hysterectomy    . Tubal ligation    . Nasal sinus surgery      History   Social History  . Marital Status: Divorced    Spouse Name: Leory Plowman    Number of Children: 3  . Years of Education: college   Occupational History  .      Home maker   Social History Main Topics  . Smoking status: Never Smoker   . Smokeless tobacco: Never Used  . Alcohol Use: No  . Drug Use: No  . Sexual Activity: Not on file   Other Topics Concern  . Not on file   Social History Narrative   Patient lives at home with her husband Leory Plowman. 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 Patient drinks 4-6 caffeine drinks daily.   Right handed.          Current Outpatient Prescriptions on File Prior to Visit  Medication Sig Dispense Refill  . cholecalciferol (VITAMIN D) 1000 UNITS tablet Take 4,000 Units by mouth daily.      . DULoxetine (CYMBALTA) 60 MG capsule Take 1 capsule (60 mg total) by mouth daily.  30 capsule  12  . fenofibrate 160 MG tablet Take 160 mg by mouth daily.      . fish oil-omega-3 fatty acids 1000 MG  capsule Take 2 g by mouth daily.      . Insulin Aspart (NOVOLOG FLEXPEN [place]) Inject into the skin. 3 times a day (just before each meal) 15-20-25 units, and syringes 4/day      . Insulin Detemir 100 UNIT/ML SOPN Inject 35 Units into the skin at bedtime.      Marland Kitchen lisinopril (PRINIVIL,ZESTRIL) 10 MG tablet Take 10 mg by mouth daily.      Marland Kitchen lubiprostone (AMITIZA) 8 MCG capsule Take 8 mcg by mouth 2 (two) times daily with a meal.      . meloxicam (MOBIC) 7.5 MG tablet Take 1 tablet (7.5 mg total) by mouth daily.  60 tablet  12  . pregabalin (LYRICA) 100 MG capsule Take 100 mg by mouth 2 (two) times daily.       No current facility-administered medications on file prior to visit.   No Known Allergies  Family History  Problem Relation Age of Onset  . Anemia Mother   . High Cholesterol Mother   . Diabetes Mother   . Arthritis Mother     BP 136/92  Pulse 97  Temp(Src) 98.6 F (37 C)  Resp 12  Ht 5\' 6"  (1.676 m)  Wt 166 lb 12.8 oz (75.66 kg)  BMI 26.94 kg/m2  SpO2 98%  Review of Systems Denies LOC and weight change.      Objective:   Physical Exam VITAL SIGNS:  See vs page. GENERAL: no distress.  Lab Results  Component Value Date   HGBA1C 7.2* 05/30/2013      Assessment & Plan:  DM: Based on the pattern of her cbg's, she needs some adjustment in her therapy.  This insulin pump regimen was chosen from multiple options, as it best matches her insulin to her changing requirements throughout the day.  The benefits of glycemic control must be weighed against the risks of hypoglycemia.  I have discussed with Cristy Folks, CDE.  She says this patient is a good pump candidate.  Painful neuropathy: this limits exercise rx of DM.

## 2013-05-30 NOTE — Progress Notes (Signed)
The patient was here with her mother.  We reviewed how the insulin pump works and the two ways it delivers insulin.  She reported good understanding of this.  She reports having done 1/2 of the education sessions on line.  She had the date and time entered correctly into the pump, and the meter connected to the pump. She put in her basal rates with little assistance from me.  The basal rate was set at 1.8u/hr, but was set to start at 7:30 PM tonight, because she took her Lantus last night at 9:30PM. Her bolus wizard calculations were put in by her with little assistance from me.:  I/C ratio: 5, Sensitivity: 20, Target: 100-120, timing: 5 hours.   She filled her cartridge and inserted her 6mm Quik Set into her abdominal area.  She reported good understanding of how to do this.   We reviewed how to treat a low blood sugar and she reported that she uses 1/2 cup of juice.  We reviewed the need to retest blood sugar in 15 min., and she reported good understanding of this.   She re demonstrated how to give a bolus correctly, using the bolus wizard.  She says that she is seeing a gastroenterologist, because he thinks she has gastroparesis.  If that is the case, I will instruct her on how to give the boluses over two hours.  She promised to let me know this.   We discussed the need to test blood sugars before meals, 2hr. pc meals, hs and 3 AM, for the next 5 days.  She agreed to do this.  She redemonstrated how to use the Contour Next meter correctly, and was shown how to enter blood sugars into her pump if she uses another meter.  She reported good understanding of this with no questions. She re demonstrated how to disconnect her infusion set correctly, and we discussed ways to wear the pump, and sleep with it.  She had no final questions.  I will call her tonight to see how she did giving her pre lunch and supper boluses, and her blood sugar readings.    She has made an appt. To see Dr. Everardo All on Wednesday, and I  will finish training her on high blood sugar protocols, alarms, and temp. basal rates, and duel and square wave boluses at that time.   She was encouraged to go on line to review all topics discussed today.  She agreed to do this.

## 2013-05-30 NOTE — Patient Instructions (Addendum)
check your blood sugar 4 times a day.  vary the time of day when you check, between before the 3 meals, and at bedtime.  also check if you have symptoms of your blood sugar being too high or too low.  please keep a record of the readings and bring it to your next appointment here.  please call us sooner if your blood sugar goes below 70, or if you have a lot of readings over 200.   The next step is for you to see the educator, to start the pump.   Please come back here a few days after starting the pump.    Addendum: take basal rate of 1.8 units/hr. take mealtime bolus of 1 unit/ 5 grams carbohydrate.  take correction bolus (which some people call "sensitivity," or "insulin sensitivity ratio," or just "isr") of 1 unit for each 20 by which your glucose exceeds 120.

## 2013-05-31 MED ORDER — INSULIN ASPART 100 UNIT/ML ~~LOC~~ SOLN: SUBCUTANEOUS | Status: DC

## 2013-05-31 NOTE — Patient Instructions (Signed)
Review work book and on [employer] of topics discussed today. Test blood sugars before meals, 2hr. pc meals, HS, and 3AM Call blood sugar readings in the AM

## 2013-05-31 NOTE — Telephone Encounter (Signed)
sent 

## 2013-05-31 NOTE — Telephone Encounter (Signed)
Left message rx has been sent. 

## 2013-06-01 ENCOUNTER — Encounter: Payer: BC Managed Care – PPO | Admitting: Nutrition

## 2013-06-01 ENCOUNTER — Encounter: Payer: Self-pay | Admitting: Nutrition

## 2013-06-01 ENCOUNTER — Encounter: Payer: Self-pay | Admitting: Endocrinology

## 2013-06-01 ENCOUNTER — Ambulatory Visit (INDEPENDENT_AMBULATORY_CARE_PROVIDER_SITE_OTHER): Payer: BC Managed Care – PPO | Admitting: Endocrinology

## 2013-06-01 VITALS — BP 122/80 | HR 112 | Wt 168.0 lb

## 2013-06-01 DIAGNOSIS — E119 Type 2 diabetes mellitus without complications: Secondary | ICD-10-CM

## 2013-06-01 DIAGNOSIS — E1039 Type 1 diabetes mellitus with other diabetic ophthalmic complication: Secondary | ICD-10-CM

## 2013-06-01 NOTE — Patient Instructions (Signed)
Continue to test blood sugars before meals and at bedtime.   Change cartridge and infusion sets every 3 days Review on line education sessions on pump therapy.

## 2013-06-01 NOTE — Progress Notes (Signed)
Subjective:    Patient ID: Amanda Frederick, female    DOB: [birth year], 42 y.o.   MRN: [id number]  HPI pt returns for f/u of type 1 DM (dx'ed 1988, on a routine blood test; she has moderate sensory neuropathy of the lower extremities, and associated retinopathy; she has been on insulin since 1992; she developed a rash after starting lantus and humalog (preferred on her insurance) ; it resolved when she stopped these).  She started pump rx 2 days ago.  no cbg record, but states cbg's are well-controlled.  pt states she feels well in general. Past Medical History  Diagnosis Date  . High blood pressure   . Diabetes   . High cholesterol   . Neuropathy     eyes , hands and feet   . Migraines   . Anxiety   . Depression   . AC (acromioclavicular) joint bone spurs     Past Surgical History  Procedure Laterality Date  . Vaginal hysterectomy    . Tubal ligation    . Nasal sinus surgery      History   Social History  . Marital Status: Divorced    Spouse Name: Leory Plowman    Number of Children: 3  . Years of Education: college   Occupational History  .      Home maker   Social History Main Topics  . Smoking status: Never Smoker   . Smokeless tobacco: Never Used  . Alcohol Use: No  . Drug Use: No  . Sexual Activity: Not on file   Other Topics Concern  . Not on file   Social History Narrative   Patient lives at home with her husband Leory Plowman. 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 Patient drinks 4-6 caffeine drinks daily.   Right handed.          Current Outpatient Prescriptions on File Prior to Visit  Medication Sig Dispense Refill  . cholecalciferol (VITAMIN D) 1000 UNITS tablet Take 4,000 Units by mouth daily.      Marland Kitchen dicyclomine (BENTYL) 10 MG capsule Take 10 mg by mouth 4 (four) times daily -  before meals and at bedtime.      . DULoxetine (CYMBALTA) 60 MG capsule Take 1 capsule (60 mg total) by mouth daily.  30 capsule  12  . fenofibrate 160 MG tablet Take 160 mg  by mouth daily.      . fish oil-omega-3 fatty acids 1000 MG capsule Take 2 g by mouth daily.      . insulin aspart (NOVOLOG) 100 UNIT/ML injection For use in pump, total of 120 units per day  4 vial  12  . lisinopril (PRINIVIL,ZESTRIL) 10 MG tablet Take 10 mg by mouth daily.      Marland Kitchen lubiprostone (AMITIZA) 8 MCG capsule Take 8 mcg by mouth 2 (two) times daily with a meal.      . meloxicam (MOBIC) 7.5 MG tablet Take 1 tablet (7.5 mg total) by mouth daily.  60 tablet  12  . omeprazole (PRILOSEC) 20 MG capsule Take 20 mg by mouth daily.      . pregabalin (LYRICA) 100 MG capsule Take 100 mg by mouth 2 (two) times daily.       No current facility-administered medications on file prior to visit.   No Known Allergies  Family History  Problem Relation Age of Onset  . Anemia Mother   . High Cholesterol Mother   . Diabetes Mother   . Arthritis Mother  BP 122/80  Pulse 112  Wt 168 lb (76.204 kg)  SpO2 98%  Review of Systems denies hypoglycemia and infusion-site pain.      Objective:   Physical Exam VITAL SIGNS:  See vs page GENERAL: no distress SKIN:  Insulin infusion sites at the anterior abdomen are normal.       Assessment & Plan:  DM: This insulin pump regimen was chosen from multiple options, as it best matches his insulin to his changing requirements throughout the day.  The benefits of glycemic control must be weighed against the risks of hypoglycemia.  Glucose is well-controlled. Neuropathy: this limits exercise rx of DM Depression: this often complicates the rx of DM.

## 2013-06-01 NOTE — Patient Instructions (Signed)
Please continue the same pump settings: basal rate of 1.8 units/hr.  mealtime bolus of 1 unit/ 5 grams carbohydrate.  correction bolus (which some people call "sensitivity," or "insulin sensitivity ratio," or just "isr") of 1 unit for each 20 by which your glucose exceeds 120. Please come back for a follow-up appointment in 1 month.

## 2013-06-01 NOTE — Progress Notes (Signed)
Patient reports good understanding of all topics discussed on Monday.  She reported no difficulty giving self boluses, using the bolus wizard, and using the new meter.  She also reports no difficulties wearing or sleeping with it.  Blood sugars look very good.  Will see Dr. Everardo All for review of this. She did another cartridge and infusion set change with no assistance from me.  We reviewed site rotations and the need to move the site at least 2 inches from the previous one. She agreed to do this.   We also reviewed the high blood sugar protocol, sick day guidelines, alarms, temp.basal rates, and duel and square wave bolusing.  She re demonstarted how to give a duel wave and square wave bolus correctly, and when to do this.   She had no final questions. Pt. Is very please with the blood sugar results, and wishes to continue with this therapy.

## 2013-06-02 ENCOUNTER — Other Ambulatory Visit: Payer: Self-pay

## 2013-06-06 ENCOUNTER — Telehealth: Payer: Self-pay | Admitting: Endocrinology

## 2013-06-06 MED ORDER — GLUCOSE BLOOD VI STRP: ORAL_STRIP | Status: DC

## 2013-06-06 MED ORDER — INSULIN ASPART 100 UNIT/ML ~~LOC~~ SOLN: SUBCUTANEOUS | Status: DC

## 2013-06-06 NOTE — Telephone Encounter (Signed)
Patient needs novalog and test strips. Resent rx

## 2013-06-09 ENCOUNTER — Telehealth: Payer: Self-pay

## 2013-06-09 NOTE — Telephone Encounter (Signed)
The patient called and is hoping to her prior auth approved for her insulin.  She states she has tried multiple times to pick up her insulin from [facility] and they are not releasing her medication. The patient is concerned because she has been having to borrow insulin due to this problem.  Please call the pt back as soon as possible, thanks!!!    Callback - [phone]

## 2013-06-13 ENCOUNTER — Other Ambulatory Visit: Payer: Self-pay | Admitting: *Deleted

## 2013-06-13 MED ORDER — GLUCOSE BLOOD VI STRP: ORAL_STRIP | Status: DC

## 2013-06-13 NOTE — Telephone Encounter (Signed)
Pt notified of prior auth approval and test strip count corrected & sent

## 2013-06-14 ENCOUNTER — Other Ambulatory Visit: Payer: Self-pay | Admitting: *Deleted

## 2013-06-14 MED ORDER — GLUCOSE BLOOD VI STRP: ORAL_STRIP | Status: DC

## 2013-06-14 MED ORDER — INSULIN ASPART 100 UNIT/ML ~~LOC~~ SOLN: SUBCUTANEOUS | Status: DC

## 2013-06-14 NOTE — Telephone Encounter (Signed)
Pt called & notified.  She will call if further assistance needed.

## 2013-07-01 ENCOUNTER — Ambulatory Visit: Payer: BC Managed Care – PPO | Admitting: Endocrinology

## 2013-10-06 ENCOUNTER — Encounter (INDEPENDENT_AMBULATORY_CARE_PROVIDER_SITE_OTHER): Payer: BC Managed Care – PPO | Admitting: Ophthalmology

## 2013-10-06 DIAGNOSIS — H2510 Age-related nuclear cataract, unspecified eye: Secondary | ICD-10-CM

## 2013-10-06 DIAGNOSIS — E1165 Type 2 diabetes mellitus with hyperglycemia: Secondary | ICD-10-CM

## 2013-10-06 DIAGNOSIS — H43819 Vitreous degeneration, unspecified eye: Secondary | ICD-10-CM

## 2013-10-06 DIAGNOSIS — E1139 Type 2 diabetes mellitus with other diabetic ophthalmic complication: Secondary | ICD-10-CM

## 2013-10-06 DIAGNOSIS — I10 Essential (primary) hypertension: Secondary | ICD-10-CM

## 2013-10-06 DIAGNOSIS — H3581 Retinal edema: Secondary | ICD-10-CM

## 2013-10-06 DIAGNOSIS — H35039 Hypertensive retinopathy, unspecified eye: Secondary | ICD-10-CM

## 2013-10-06 DIAGNOSIS — E11319 Type 2 diabetes mellitus with unspecified diabetic retinopathy without macular edema: Secondary | ICD-10-CM

## 2013-11-07 ENCOUNTER — Encounter: Payer: Self-pay | Admitting: Nurse Practitioner

## 2013-11-07 ENCOUNTER — Telehealth: Payer: Self-pay | Admitting: Nurse Practitioner

## 2013-11-07 ENCOUNTER — Ambulatory Visit (INDEPENDENT_AMBULATORY_CARE_PROVIDER_SITE_OTHER): Payer: BC Managed Care – PPO | Admitting: Nurse Practitioner

## 2013-11-07 VITALS — BP 125/85 | HR 102 | Ht 65.0 in | Wt 171.0 lb

## 2013-11-07 DIAGNOSIS — M542 Cervicalgia: Secondary | ICD-10-CM

## 2013-11-07 NOTE — Telephone Encounter (Signed)
Pt called stating she was advised by NP/CM that if she wanted to be referred over for Pain Management Injections and PT. Pt states she does want to be referred over. Please call pt concerning this matter. Thanks

## 2013-11-07 NOTE — Progress Notes (Signed)
[facility]  PATIENT: Amanda Frederick DOB: 10/19/1970   REASON FOR VISIT: Followup for neck pain    HISTORY OF PRESENT ILLNESS: [name], 43 year old female returns for followup. She was initially evaluated by Dr. Terrace ArabiaYan 04/22/2013 for differential diagnosis of cervical radiculopathy versus peripheral neuropathy or cervical myelopathy. Her MRI of the cervical C5-C6 bulging with mild spinal stenosis and severe formal stenosis C3-C4. C5 vertebral body has decreased T1 signal. She has continued to have neck pain. She also had EMG nerve conduction and there was no electrodiagnostic cervical radiculopathy however she did have evidence of cervical spondylitic disease and she was referred to [facility] for  pain management. She is already taking Mobic, Cymbalta and Lyrica. She has not had any physical therapy which may also be beneficial. 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 She reports her most recent A1c was 6 several weeks ago. She has also been placed on a dose unknown of Topamax for migraines and Lunesta for sleep. She returns for reevaluation   HISTORY: evaluation for peripheral neuropathy, chronic neck pain  By Dr. Terrace ArabiaYan 04/22/13.  She had past medical history of insulin-dependent diabetes since age 43, also had a history of hypertension, gastroparesis, diabetic peripheral neuropathy  She suffered a fall in 2002, fell backwards, landed on her occipital area, with transient loss of consciousness, broke her right hip, ever since the accident, she has constant neck pain, radiating pain to bilateral shoulder, frequent headaches, starting from upper cervical region, spreading forward vortex  In 2011, she had worsening above symptoms, [facility] reported abnormal MRI of cervical, had epidural injections failed to respond, physical therapy ws only minimum helpful, she was offered surgery, but with potential guarded outcome,, and  complications, she elected not to have surgery  Over the past 3 years, she has worsening neck pain, radiating pain to bilateral shoulder, upper extremities, especially middle fingers, numbness, difficulty raising left arm overhead, gait difficulty, bilateral feet burning discomfort, difficulty sleeping  She was put on Lyrica 100 mg twice a day about a week ago, with mild improvement of her symptoms, she complains fatigue sleepiness with the medication,  REVIEW OF SYSTEMS: Full 14 system review of systems performed and notable only for those listed, all others are neg:  Constitutional: Fatigue  Cardiovascular: N/A  Ear/Nose/Throat: N/A  Skin: N/A  Eyes: Light sensitivity, blurred vision , recent laser surgery on the left for retinal bleeding Respiratory: N/A  Gastroitestinal: N/A  Hematology/Lymphatic: N/A  Endocrine:  intolerance to Cold and heat Musculoskeletal:Joint pain, neck pain, neck stiffness, achy muscles  Allergy/Immunology: N/A  Neurological:  migraine weakness  Psychiatric: Anxiety    ALLERGIES: No Known Allergies  HOME MEDICATIONS: Outpatient Prescriptions Prior to Visit  Medication Sig Dispense Refill  . cholecalciferol (VITAMIN D) 1000 UNITS tablet Take 4,000 Units by mouth daily.      . Continuous Glucose Monitor Sup (ENLITE GLUCOSE SENSOR) MISC 1 Device by Does not apply route once a week.      . dicyclomine (BENTYL) 10 MG capsule Take 10 mg by mouth 4 (four) times daily -  before meals and at bedtime.      . DULoxetine (CYMBALTA) 60 MG capsule Take 1 capsule (60 mg total) by mouth daily.  30 capsule  12  . fenofibrate 160 MG tablet Take 160 mg by mouth daily.      . fish oil-omega-3 fatty acids 1000 MG capsule Take 2 g by mouth daily.      .Marland Kitchen  glucose blood (BAYER CONTOUR NEXT TEST) test strip Test 8-10 times per day 8-10 lancets/day  300 each  11  . insulin aspart (NOVOLOG) 100 UNIT/ML injection For use in pump, total of 120 units per day  5 vial  12  . Insulin  Infusion Pump Supplies (PARADIGM QUICK-SET 32" ) MISC 1 Device by Does not apply route every 3 (three) days.      . Insulin Infusion Pump Supplies (PARADIGM RESERVOIR ) MISC 1 Device by Does not apply route every 3 (three) days.      Marland Kitchen lisinopril (PRINIVIL,ZESTRIL) 10 MG tablet Take 20 mg by mouth daily.       Marland Kitchen lubiprostone (AMITIZA) 8 MCG capsule Take 8 mcg by mouth 2 (two) times daily with a meal.      . omeprazole (PRILOSEC) 20 MG capsule Take 20 mg by mouth daily.      . pregabalin (LYRICA) 100 MG capsule Take 300 mg by mouth 2 (two) times daily.       . meloxicam (MOBIC) 7.5 MG tablet Take 1 tablet (7.5 mg total) by mouth daily.  60 tablet  12   No facility-administered medications prior to visit.    PAST MEDICAL HISTORY: Past Medical History  Diagnosis Date  . High blood pressure   . Diabetes   . High cholesterol   . Neuropathy     eyes , hands and feet   . Migraines   . Anxiety   . Depression   . AC (acromioclavicular) joint bone spurs     PAST SURGICAL HISTORY: Past Surgical History  Procedure Laterality Date  . Vaginal hysterectomy    . Tubal ligation    . Nasal sinus surgery    Recent laser surgery for retinal bleeding on the left   FAMILY HISTORY: Family History  Problem Relation Age of Onset  . Anemia Mother   . High Cholesterol Mother   . Diabetes Mother   . Arthritis Mother     SOCIAL HISTORY: History   Social History  . Marital Status: Divorced    Spouse Name: Leory Plowman    Number of Children: 3  . Years of Education: college   Occupational History  .      Home maker   Social History Main Topics  . Smoking status: Never Smoker   . Smokeless tobacco: Never Used  . Alcohol Use: No  . Drug Use: No  . Sexual Activity: Not on file   Other Topics Concern  . Not on file   Social History Narrative   Patient lives at home with her husband Leory Plowman. 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 Patient drinks 4-6 caffeine drinks daily.    Right handed.           PHYSICAL EXAM  Filed Vitals:   11/07/13 1109  BP: 125/85  Pulse: 102  Height: 5\' 5"  (1.651 m)  Weight: 171 lb (77.565 kg)   Body mass index is 28.46 kg/(m^2).  Generalized: Well developed, in no acute distress  Head: normocephalic and atraumatic,. Oropharynx benign  Neck: Supple, no carotid bruits, with restriction to range of motion right and left   Cardiac: Regular rate rhythm, no murmur  Musculoskeletal: No deformity   Neurological examination   Mentation: Alert oriented to time, place, history taking. Follows all commands speech and language fluent  Cranial nerve II-XII: Pupils were equal round reactive to light extraocular movements were full, visual field were full on confrontational test. Facial sensation and strength were  normal. hearing was intact to finger rubbing bilaterally. Uvula tongue midline.Shoulder shrug was normal and symmetric.Tongue protrusion into cheek strength was normal. Motor: normal bulk and tone, full strength in the BUE, BLE, with the exception of mild left shoulder abduction external rotation weakness.  Sensory: normal and symmetric to light touch, pinprick, and  vibration  Coordination: finger-nose-finger, heel-to-shin bilaterally, no dysmetria Reflexes: Brachioradialis 2/2, biceps 2/2, triceps 2/2, patellar 2/2, Achilles 1/1, plantar responses were flexor bilaterally. Gait and Station: Rising up from seated position without assistance, normal stance,  moderate stride, good arm swing, smooth turning, able to perform tiptoe, and heel walking without difficulty. Tandem gait is steady  DIAGNOSTIC DATA (LABS, IMAGING, TESTING) - I reviewed patient records, labs, notes, testing and imaging myself where available.      Component Value Date/Time   NA 140 05/12/2013 1104   K 4.1 05/12/2013 1104   CL 104 05/12/2013 1104   CO2 26 05/12/2013 1104   GLUCOSE 182* 05/12/2013 1104   BUN 15 05/12/2013 1104   CREATININE 0.7  05/12/2013 1104   CALCIUM 9.0 05/12/2013 1104    Lab Results  Component Value Date   HGBA1C 7.2* 05/30/2013     EMG nerve conduction the cervical area without cervical radiculopathy or upper extremity neuropathy   ASSESSMENT AND PLAN  43 y.o. year old female  has a past medical history of High blood pressure; Diabetes; High cholesterol; Neuropathy; Migraines; Anxiety; Depression; and AC (acromioclavicular) joint bone spurs. here to followup. She had been referred to [facility] for pain management by Dr. Terrace Arabia however she talked over the phone but never made  an appointment .   Continue Mobic, Lyrica and, Cymbalta for neck pain EMG nerve conduction without evidence of active cervical radiculopathy There is evidence however of this severe cervical degenerative disease Recommend physical therapy in your area as well as pain management injections, patient lives 40 minutes from here and  does not drive. She will ask her primary care about management closer to her area Followup yearly Nilda Riggs, [facility], [facility], APRN  [facility] [address] [place], [place] [address] [phone]

## 2013-11-07 NOTE — Patient Instructions (Signed)
Continue Mobic, Lyrica and, Cymbalta for neck pain EMG nerve conduction without evidence of active cervical radiculopathy There is evidence however of this severe cervical degenerative disease Recommend physical therapy in your area as well as pain management injections Followup yearly

## 2013-11-08 NOTE — Telephone Encounter (Signed)
Pt lives in [place] she was going to call me with a pain management facility and PT provider in [place] or closer to her home. She does not drive so coming to [place] is not going to work. She can get the name of providers from PCP.

## 2013-11-21 ENCOUNTER — Telehealth: Payer: Self-pay | Admitting: *Deleted

## 2013-11-21 DIAGNOSIS — M542 Cervicalgia: Secondary | ICD-10-CM

## 2013-11-21 NOTE — Telephone Encounter (Signed)
Pt calling stating that she would rather have referral for Pain Management injections and PT sent to somewhere in [place], wherever Eber Jones, NP recommends. Pt was calling back base on # note on 11/07/13. Please advise

## 2013-11-21 NOTE — Telephone Encounter (Signed)
Patient stated she would rather have referral for Pain Management injections and PT done here in [place] instead of close to her home.  Wanting to have referred sent to wherever CM recommends.  Please call patient and advise.  Thanks

## 2013-11-22 NOTE — Telephone Encounter (Signed)
Please let patient know I have made referral to neuro rehab and Dr. Thyra Breed for pain management

## 2013-11-22 NOTE — Telephone Encounter (Signed)
Called pt to inform her that Eber Jones, NP has put in a referral to neuro rehab and Dr. Thyra Breed for pain management. I advised the pt the if she has any other problems, questions or concerns to call the office. Pt verbalized understanding.

## 2014-02-06 ENCOUNTER — Ambulatory Visit (INDEPENDENT_AMBULATORY_CARE_PROVIDER_SITE_OTHER): Payer: BC Managed Care – PPO | Admitting: Ophthalmology

## 2014-02-06 DIAGNOSIS — H35039 Hypertensive retinopathy, unspecified eye: Secondary | ICD-10-CM

## 2014-02-06 DIAGNOSIS — E1165 Type 2 diabetes mellitus with hyperglycemia: Secondary | ICD-10-CM

## 2014-02-06 DIAGNOSIS — H538 Other visual disturbances: Secondary | ICD-10-CM

## 2014-02-06 DIAGNOSIS — H2510 Age-related nuclear cataract, unspecified eye: Secondary | ICD-10-CM

## 2014-02-06 DIAGNOSIS — E11319 Type 2 diabetes mellitus with unspecified diabetic retinopathy without macular edema: Secondary | ICD-10-CM

## 2014-02-06 DIAGNOSIS — I10 Essential (primary) hypertension: Secondary | ICD-10-CM

## 2014-02-06 DIAGNOSIS — E1139 Type 2 diabetes mellitus with other diabetic ophthalmic complication: Secondary | ICD-10-CM

## 2014-02-06 DIAGNOSIS — H43819 Vitreous degeneration, unspecified eye: Secondary | ICD-10-CM

## 2014-02-23 ENCOUNTER — Telehealth: Payer: Self-pay

## 2014-02-23 NOTE — Telephone Encounter (Signed)
Diabetic Bundle. Lvom for pt to call back and schedule appointment with Dr. Ellison.  

## 2014-03-02 NOTE — Telephone Encounter (Signed)
Noted  

## 2014-05-30 ENCOUNTER — Telehealth: Payer: Self-pay

## 2014-05-30 NOTE — Telephone Encounter (Signed)
Lvom for pt to call back and schedule office visit with Dr. Everardo All.

## 2014-11-08 ENCOUNTER — Ambulatory Visit: Payer: BC Managed Care – PPO | Admitting: Nurse Practitioner

## 2014-11-13 DIAGNOSIS — Z0289 Encounter for other administrative examinations: Secondary | ICD-10-CM

## 2015-01-18 LAB — HM DIABETES EYE EXAM

## 2015-02-07 ENCOUNTER — Ambulatory Visit (INDEPENDENT_AMBULATORY_CARE_PROVIDER_SITE_OTHER): Payer: BC Managed Care – PPO | Admitting: Ophthalmology

## 2020-01-26 DIAGNOSIS — Z8616 Personal history of COVID-19: Secondary | ICD-10-CM

## 2020-01-26 HISTORY — DX: Personal history of COVID-19: Z86.16

## 2020-03-02 ENCOUNTER — Other Ambulatory Visit: Payer: Self-pay

## 2020-03-02 ENCOUNTER — Emergency Department (HOSPITAL_COMMUNITY): Payer: 59

## 2020-03-02 ENCOUNTER — Inpatient Hospital Stay (HOSPITAL_COMMUNITY)
Admission: EM | Admit: 2020-03-02 | Discharge: 2020-03-07 | DRG: 177 | Disposition: A | Discharge status: Home / Self Care | Payer: 59 | Attending: Internal Medicine | Admitting: Internal Medicine

## 2020-03-02 ENCOUNTER — Ambulatory Visit: Payer: Self-pay

## 2020-03-02 ENCOUNTER — Encounter (HOSPITAL_COMMUNITY): Payer: Self-pay

## 2020-03-02 DIAGNOSIS — E785 Hyperlipidemia, unspecified: Secondary | ICD-10-CM | POA: Diagnosis present

## 2020-03-02 DIAGNOSIS — E1140 Type 2 diabetes mellitus with diabetic neuropathy, unspecified: Secondary | ICD-10-CM | POA: Diagnosis present

## 2020-03-02 DIAGNOSIS — E876 Hypokalemia: Secondary | ICD-10-CM | POA: Diagnosis present

## 2020-03-02 DIAGNOSIS — Z9641 Presence of insulin pump (external) (internal): Secondary | ICD-10-CM | POA: Diagnosis present

## 2020-03-02 DIAGNOSIS — K529 Noninfective gastroenteritis and colitis, unspecified: Secondary | ICD-10-CM | POA: Diagnosis present

## 2020-03-02 DIAGNOSIS — Z83438 Family history of other disorder of lipoprotein metabolism and other lipidemia: Secondary | ICD-10-CM

## 2020-03-02 DIAGNOSIS — Z8261 Family history of arthritis: Secondary | ICD-10-CM

## 2020-03-02 DIAGNOSIS — E7800 Pure hypercholesterolemia, unspecified: Secondary | ICD-10-CM | POA: Diagnosis present

## 2020-03-02 DIAGNOSIS — E139 Other specified diabetes mellitus without complications: Secondary | ICD-10-CM

## 2020-03-02 DIAGNOSIS — I10 Essential (primary) hypertension: Secondary | ICD-10-CM | POA: Diagnosis present

## 2020-03-02 DIAGNOSIS — E119 Type 2 diabetes mellitus without complications: Secondary | ICD-10-CM

## 2020-03-02 DIAGNOSIS — J1282 Pneumonia due to coronavirus disease 2019: Secondary | ICD-10-CM | POA: Diagnosis present

## 2020-03-02 DIAGNOSIS — E1165 Type 2 diabetes mellitus with hyperglycemia: Secondary | ICD-10-CM | POA: Diagnosis present

## 2020-03-02 DIAGNOSIS — Z833 Family history of diabetes mellitus: Secondary | ICD-10-CM

## 2020-03-02 DIAGNOSIS — F32A Depression, unspecified: Secondary | ICD-10-CM | POA: Diagnosis present

## 2020-03-02 DIAGNOSIS — J9601 Acute respiratory failure with hypoxia: Secondary | ICD-10-CM | POA: Diagnosis present

## 2020-03-02 DIAGNOSIS — U071 COVID-19: Secondary | ICD-10-CM | POA: Diagnosis not present

## 2020-03-02 DIAGNOSIS — Z794 Long term (current) use of insulin: Secondary | ICD-10-CM

## 2020-03-02 DIAGNOSIS — IMO0002 Reserved for concepts with insufficient information to code with codable children: Secondary | ICD-10-CM | POA: Diagnosis present

## 2020-03-02 DIAGNOSIS — F419 Anxiety disorder, unspecified: Secondary | ICD-10-CM | POA: Diagnosis present

## 2020-03-02 DIAGNOSIS — F329 Major depressive disorder, single episode, unspecified: Secondary | ICD-10-CM | POA: Diagnosis present

## 2020-03-02 LAB — CBC WITH DIFFERENTIAL/PLATELET
Abs Immature Granulocytes: 0.07 10*3/uL (ref 0.00–0.07)
Basophils Absolute: 0 10*3/uL (ref 0.0–0.1)
Basophils Relative: 0 %
Eosinophils Absolute: 0 10*3/uL (ref 0.0–0.5)
Eosinophils Relative: 0 %
HCT: 44.1 % (ref 36.0–46.0)
Hemoglobin: 14.5 g/dL (ref 12.0–15.0)
Immature Granulocytes: 1 %
Lymphocytes Relative: 15 %
Lymphs Abs: 1.6 10*3/uL (ref 0.7–4.0)
MCH: 27.5 pg (ref 26.0–34.0)
MCHC: 32.9 g/dL (ref 30.0–36.0)
MCV: 83.7 fL (ref 80.0–100.0)
Monocytes Absolute: 0.4 10*3/uL (ref 0.1–1.0)
Monocytes Relative: 4 %
Neutro Abs: 8.4 10*3/uL — ABNORMAL HIGH (ref 1.7–7.7)
Neutrophils Relative %: 80 %
Platelets: 245 10*3/uL (ref 150–400)
RBC: 5.27 MIL/uL — ABNORMAL HIGH (ref 3.87–5.11)
RDW: 12 % (ref 11.5–15.5)
WBC: 10.4 10*3/uL (ref 4.0–10.5)
nRBC: 0 % (ref 0.0–0.2)

## 2020-03-02 LAB — COMPREHENSIVE METABOLIC PANEL
ALT: 18 U/L (ref 0–44)
AST: 23 U/L (ref 15–41)
Albumin: 3.1 g/dL — ABNORMAL LOW (ref 3.5–5.0)
Alkaline Phosphatase: 65 U/L (ref 38–126)
Anion gap: 13 (ref 5–15)
BUN: 12 mg/dL (ref 6–20)
CO2: 23 mmol/L (ref 22–32)
Calcium: 8.5 mg/dL — ABNORMAL LOW (ref 8.9–10.3)
Chloride: 104 mmol/L (ref 98–111)
Creatinine, Ser: 0.7 mg/dL (ref 0.44–1.00)
GFR calc Af Amer: >60 mL/min (ref >60)
GFR calc non Af Amer: >60 mL/min (ref >60)
Glucose, Bld: 153 mg/dL — ABNORMAL HIGH (ref 70–99)
Potassium: 3 mmol/L — ABNORMAL LOW (ref 3.5–5.1)
Sodium: 140 mmol/L (ref 135–145)
Total Bilirubin: 0.9 mg/dL (ref 0.3–1.2)
Total Protein: 6.7 g/dL (ref 6.5–8.1)

## 2020-03-02 LAB — PROCALCITONIN: Procalcitonin: 0.1 ng/mL

## 2020-03-02 LAB — TRIGLYCERIDES: Triglycerides: 297 mg/dL — ABNORMAL HIGH (ref <150)

## 2020-03-02 LAB — SARS CORONAVIRUS 2 BY RT PCR (HOSPITAL ORDER, PERFORMED IN ~~LOC~~ HOSPITAL LAB): SARS Coronavirus 2: POSITIVE — AB

## 2020-03-02 LAB — HEMOGLOBIN A1C
Hgb A1c MFr Bld: 9.7 % — ABNORMAL HIGH (ref 4.8–5.6)
Mean Plasma Glucose: 231.69 mg/dL

## 2020-03-02 LAB — C-REACTIVE PROTEIN: CRP: 4.9 mg/dL — ABNORMAL HIGH (ref <1.0)

## 2020-03-02 LAB — D-DIMER, QUANTITATIVE: D-Dimer, Quant: 0.75 ug/mL-FEU — ABNORMAL HIGH (ref 0.00–0.50)

## 2020-03-02 LAB — ABO/RH: ABO/RH(D): AB POS

## 2020-03-02 LAB — FERRITIN: Ferritin: 434 ng/mL — ABNORMAL HIGH (ref 11–307)

## 2020-03-02 LAB — CBG MONITORING, ED: Glucose-Capillary: 179 mg/dL — ABNORMAL HIGH (ref 70–99)

## 2020-03-02 LAB — LACTATE DEHYDROGENASE: LDH: 324 U/L — ABNORMAL HIGH (ref 98–192)

## 2020-03-02 LAB — LACTIC ACID, PLASMA: Lactic Acid, Venous: 1.5 mmol/L (ref 0.5–1.9)

## 2020-03-02 LAB — I-STAT BETA HCG BLOOD, ED (MC, WL, AP ONLY): I-stat hCG, quantitative: <5 m[IU]/mL (ref <5)

## 2020-03-02 LAB — HIV ANTIBODY (ROUTINE TESTING W REFLEX): HIV Screen 4th Generation wRfx: NONREACTIVE

## 2020-03-02 LAB — FIBRINOGEN: Fibrinogen: 550 mg/dL — ABNORMAL HIGH (ref 210–475)

## 2020-03-02 MED ORDER — ONDANSETRON HCL 4 MG PO TABS
4.0000 mg | ORAL_TABLET | Freq: Four times a day (QID) | ORAL | Status: DC | PRN
  Filled 2020-03-02 (×2): qty 1

## 2020-03-02 MED ORDER — INSULIN ASPART 100 UNIT/ML ~~LOC~~ SOLN
0.0000 [IU] | SUBCUTANEOUS | Status: DC
  Administered 2020-03-02: 2 [IU] via SUBCUTANEOUS
  Administered 2020-03-03: 1 [IU] via SUBCUTANEOUS
  Administered 2020-03-03: 3 [IU] via SUBCUTANEOUS
  Administered 2020-03-04: 2 [IU] via SUBCUTANEOUS
  Administered 2020-03-04: 1 [IU] via SUBCUTANEOUS
  Administered 2020-03-04: 9 [IU] via SUBCUTANEOUS
  Administered 2020-03-04: 2 [IU] via SUBCUTANEOUS
  Administered 2020-03-05 (×2): 3 [IU] via SUBCUTANEOUS
  Administered 2020-03-05: 9 [IU] via SUBCUTANEOUS
  Administered 2020-03-05: 2 [IU] via SUBCUTANEOUS
  Administered 2020-03-05: 7 [IU] via SUBCUTANEOUS
  Administered 2020-03-05: 3 [IU] via SUBCUTANEOUS
  Administered 2020-03-06: 1 [IU] via SUBCUTANEOUS
  Administered 2020-03-06: 3 [IU] via SUBCUTANEOUS
  Administered 2020-03-06: 2 [IU] via SUBCUTANEOUS
  Administered 2020-03-06 (×2): 9 [IU] via SUBCUTANEOUS
  Administered 2020-03-06 – 2020-03-07 (×2): 2 [IU] via SUBCUTANEOUS
  Administered 2020-03-07 (×3): 5 [IU] via SUBCUTANEOUS

## 2020-03-02 MED ORDER — INSULIN ASPART 100 UNIT/ML ~~LOC~~ SOLN: 0.0000 [IU] | SUBCUTANEOUS | Status: DC

## 2020-03-02 MED ORDER — SODIUM CHLORIDE 0.9% FLUSH
3.0000 mL | Freq: Two times a day (BID) | INTRAVENOUS | Status: DC
  Administered 2020-03-02 – 2020-03-07 (×8): 3 mL via INTRAVENOUS

## 2020-03-02 MED ORDER — INSULIN NPH (HUMAN) (ISOPHANE) 100 UNIT/ML ~~LOC~~ SUSP
20.0000 [IU] | Freq: Two times a day (BID) | SUBCUTANEOUS | Status: DC
  Administered 2020-03-02 – 2020-03-03 (×2): 20 [IU] via SUBCUTANEOUS
  Filled 2020-03-02: qty 10

## 2020-03-02 MED ORDER — SODIUM CHLORIDE 0.9 % IV SOLN
200.0000 mg | Freq: Once | INTRAVENOUS | Status: AC
  Administered 2020-03-02: 200 mg via INTRAVENOUS
  Filled 2020-03-02: qty 40

## 2020-03-02 MED ORDER — POTASSIUM CHLORIDE CRYS ER 20 MEQ PO TBCR
60.0000 meq | EXTENDED_RELEASE_TABLET | Freq: Once | ORAL | Status: AC
  Administered 2020-03-02: 60 meq via ORAL
  Filled 2020-03-02: qty 3

## 2020-03-02 MED ORDER — SODIUM CHLORIDE 0.9 % IV SOLN
100.0000 mg | Freq: Every day | INTRAVENOUS | Status: AC
  Administered 2020-03-03 – 2020-03-06 (×4): 100 mg via INTRAVENOUS
  Filled 2020-03-02 (×4): qty 20

## 2020-03-02 MED ORDER — ACETAMINOPHEN 325 MG PO TABS
650.0000 mg | ORAL_TABLET | Freq: Four times a day (QID) | ORAL | Status: DC | PRN
  Administered 2020-03-03 – 2020-03-07 (×6): 650 mg via ORAL
  Filled 2020-03-02 (×6): qty 2

## 2020-03-02 MED ORDER — ONDANSETRON HCL 4 MG/2ML IJ SOLN
4.0000 mg | Freq: Once | INTRAMUSCULAR | Status: AC
  Administered 2020-03-02: 4 mg via INTRAVENOUS
  Filled 2020-03-02: qty 2

## 2020-03-02 MED ORDER — HYDROCOD POLST-CPM POLST ER 10-8 MG/5ML PO SUER
5.0000 mL | Freq: Two times a day (BID) | ORAL | Status: DC | PRN
  Administered 2020-03-03 – 2020-03-07 (×5): 5 mL via ORAL
  Filled 2020-03-02 (×4): qty 5

## 2020-03-02 MED ORDER — ENOXAPARIN SODIUM 40 MG/0.4ML ~~LOC~~ SOLN
40.0000 mg | SUBCUTANEOUS | Status: DC
  Administered 2020-03-03 – 2020-03-07 (×5): 40 mg via SUBCUTANEOUS
  Filled 2020-03-02 (×5): qty 0.4

## 2020-03-02 MED ORDER — GUAIFENESIN-DM 100-10 MG/5ML PO SYRP
10.0000 mL | ORAL_SOLUTION | ORAL | Status: DC | PRN
  Administered 2020-03-03: 10 mL via ORAL

## 2020-03-02 MED ORDER — SODIUM CHLORIDE 0.9 % IV SOLN: 250.0000 mL | INTRAVENOUS | Status: DC | PRN

## 2020-03-02 MED ORDER — SODIUM CHLORIDE 0.9% FLUSH: 3.0000 mL | INTRAVENOUS | Status: DC | PRN

## 2020-03-02 MED ORDER — ACETAMINOPHEN 500 MG PO TABS
1000.0000 mg | ORAL_TABLET | Freq: Once | ORAL | Status: AC
  Administered 2020-03-02: 1000 mg via ORAL
  Filled 2020-03-02: qty 2

## 2020-03-02 MED ORDER — ONDANSETRON HCL 4 MG/2ML IJ SOLN
4.0000 mg | Freq: Four times a day (QID) | INTRAMUSCULAR | Status: DC | PRN
  Administered 2020-03-03: 4 mg via INTRAVENOUS
  Filled 2020-03-02: qty 2

## 2020-03-02 NOTE — ED Triage Notes (Signed)
Pt arrives to ED w/ c/o covid symptoms. Pt tested positive for covid on weds. Pt endorses fever, cough, body aches, vomiting.

## 2020-03-02 NOTE — Telephone Encounter (Signed)
Patient's daughter Hampton Abbot called, patient gave verbal permission to speak to her. She says her mother was released from the hospital on Wednesday, she's COVID positive, and she's been still vomiting. She says she's not able to keep anything down, no medicines, no foods, no liquids. She says she's been sipping on one cup of tea all day. The patient reports abdominal pain and diarrhea, little urination. I advised to take her back to the ED, she verbalized understanding.  Reason for Disposition . [1] SEVERE vomiting (e.g., 6 or more times/day) AND [2] present > 8 hours (Exception: patient sounds well, is drinking liquids, does not sound dehydrated, and vomiting has lasted less than 24 hours)  Answer Assessment - Initial Assessment Questions 1. VOMITING SEVERITY: "How many times have you vomited in the past 24 hours?"     - MILD:  1 - 2 times/day    - MODERATE: 3 - 5 times/day, decreased oral intake without significant weight loss or symptoms of dehydration    - SEVERE: 6 or more times/day, vomits everything or nearly everything, with significant weight loss, symptoms of dehydration      Severe 2. ONSET: "When did the vomiting begin?"      More than a week 3. FLUIDS: "What fluids or food have you vomited up today?" "Have you been able to keep any fluids down?"     Sipping on tea one cup all day; vomited today 4. ABDOMINAL PAIN: "Are your having any abdominal pain?" If yes : "How bad is it and what does it feel like?" (e.g., crampy, dull, intermittent, constant)      Yes 5. DIARRHEA: "Is there any diarrhea?" If Yes, ask: "How many times today?"      Yes 6. CONTACTS: "Is there anyone else in the family with the same symptoms?"      COVID positive 7. CAUSE: "What do you think is causing your vomiting?"     She has COVID 8. HYDRATION STATUS: "Any signs of dehydration?" (e.g., dry mouth [not only dry lips], too weak to stand) "When did you last urinate?"      Yes 9. OTHER SYMPTOMS: "Do you have any  other symptoms?" (e.g., fever, headache, vertigo, vomiting blood or coffee grounds, recent head injury)     Fever 10. PREGNANCY: "Is there any chance you are pregnant?" "When was your last menstrual period?"      N/A  Protocols used: [facility]

## 2020-03-02 NOTE — ED Notes (Signed)
Primary RN and provider made aware of positive COVID

## 2020-03-02 NOTE — ED Provider Notes (Signed)
[facility] EMERGENCY DEPARTMENT Provider Note   CSN: [id number] Arrival date & time: 03/02/20  1813     History Chief Complaint  Patient presents with  . Fever    Amanda Frederick is a 49 y.o. female.  HPI    49 year old female with nausea, vomiting, body aches, fever and dyspnea.  She reports that she began feeling poorly this past weekend.  Tested positive for Covid through [facility] on Wednesday.  Symptoms have progressed since then.  Minimal cough.  Does feel mildly short of breath.  Diabetic but she reports that her glucose has been under pretty good control.  Past Medical History:  Diagnosis Date  . AC (acromioclavicular) joint bone spurs   . Anxiety   . Depression   . Diabetes (HCC)   . High blood pressure   . High cholesterol   . Migraines   . Neuropathy    eyes , hands and feet     Patient Active Problem List   Diagnosis Date Noted  . Neck pain 04/22/2013  . Diabetes (HCC)   . High cholesterol   . Depression   . 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 Unspecified hereditary and idiopathic peripheral neuropathy 03/21/2013  . 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 Unspecified essential hypertension 03/21/2013  . Low back pain 03/21/2013  . Polycythemia, secondary 03/21/2013  . Dyslipidemia 03/21/2013    Past Surgical History:  Procedure Laterality Date  . NASAL SINUS SURGERY    . TUBAL LIGATION    . VAGINAL HYSTERECTOMY       OB History   No obstetric history on file.     Family History  Problem Relation Age of Onset  . Anemia Mother   . High Cholesterol Mother   . Diabetes Mother   . Arthritis Mother     Social History   Tobacco Use  . Smoking status: Never Smoker  . Smokeless tobacco: Never Used  Substance Use Topics  . Alcohol use: No  . Drug  use: No    Home Medications Prior to Admission medications   Medication Sig Start Date End Date Taking? Authorizing Provider  cholecalciferol (VITAMIN D) 1000 UNITS tablet Take 4,000 Units by mouth daily.    [provider]  Continuous Glucose Monitor Sup (ENLITE GLUCOSE SENSOR) MISC 1 Device by Does not apply route once a week.    [provider]  dicyclomine (BENTYL) 10 MG capsule Take 10 mg by mouth 4 (four) times daily -  before meals and at bedtime.    [provider]  DULoxetine (CYMBALTA) 60 MG capsule Take 1 capsule (60 mg total) by mouth daily. 04/22/13   Levert Feinstein, MD  Eszopiclone (ESZOPICLONE) 3 MG TABS Take 3 mg by mouth at bedtime. Take immediately before bedtime    [provider]  fenofibrate 160 MG tablet Take 160 mg by mouth daily.    [provider]  fish oil-omega-3 fatty acids 1000 MG capsule Take 2 g by mouth daily.    [provider]  glucose blood (BAYER CONTOUR NEXT TEST) test strip Test 8-10 times per day 8-10 lancets/day 06/14/13   Romero Belling, MD  insulin aspart (NOVOLOG) 100 UNIT/ML injection For use in pump, total of 120 units per day 06/14/13   Romero Belling, MD  Insulin Infusion Pump Supplies (PARADIGM QUICK-SET 32" ) MISC 1 Device  by Does not apply route every 3 (three) days.    [provider]  Insulin Infusion Pump Supplies (PARADIGM RESERVOIR ) MISC 1 Device by Does not apply route every 3 (three) days.    [provider]  lisinopril (PRINIVIL,ZESTRIL) 10 MG tablet Take 20 mg by mouth daily.     [provider]  lubiprostone (AMITIZA) 8 MCG capsule Take 8 mcg by mouth 2 (two) times daily with a meal.    [provider]  meloxicam (MOBIC) 7.5 MG tablet Take 15 mg by mouth daily. 05/09/13   Levert Feinstein, MD  omeprazole (PRILOSEC) 20 MG capsule Take 20 mg by mouth daily.    [provider]  pregabalin (LYRICA) 100 MG capsule Take 300 mg by mouth 2 (two) times  daily.     [provider]  topiramate (TOPAMAX) 100 MG tablet 100 mg at bedtime. 10/02/13   [provider]    Allergies    Patient has no known allergies.  Review of Systems   Review of Systems All systems reviewed and negative, other than as noted in HPI.  Physical Exam Updated Vital Signs BP 131/83   Pulse 97   Temp 100.1 F (37.8 C) (Oral)   Resp (!) 23   SpO2 94%   Physical Exam Vitals and nursing note reviewed.  Constitutional:      Appearance: She is well-developed.     Comments: Appears tired but not distressed.   HENT:     Head: Normocephalic and atraumatic.  Eyes:     General:        Right eye: No discharge.        Left eye: No discharge.     Conjunctiva/sclera: Conjunctivae normal.  Cardiovascular:     Rate and Rhythm: Regular rhythm. Tachycardia present.     Heart sounds: Normal heart sounds. No murmur heard.  No friction rub. No gallop.   Pulmonary:     Effort: Pulmonary effort is normal. No respiratory distress.     Breath sounds: Normal breath sounds.  Abdominal:     General: There is no distension.     Palpations: Abdomen is soft.     Tenderness: There is no abdominal tenderness.  Musculoskeletal:        General: No tenderness.     Cervical back: Neck supple.  Skin:    General: Skin is warm and dry.  Neurological:     Mental Status: She is alert.  Psychiatric:        Behavior: Behavior normal.        Thought Content: Thought content normal.     ED Results / Procedures / Treatments   Labs (all labs ordered are listed, but only abnormal results are displayed) Labs Reviewed  CBC WITH DIFFERENTIAL/PLATELET - Abnormal; Notable for the following components:      Result Value   RBC 5.27 (*)    Neutro Abs 8.4 (*)    All other components within normal limits  COMPREHENSIVE METABOLIC PANEL - Abnormal; Notable for the following components:   Potassium 3.0 (*)    Glucose, Bld 153 (*)    Calcium 8.5 (*)    Albumin 3.1 (*)     All other components within normal limits  LACTATE DEHYDROGENASE - Abnormal; Notable for the following components:   LDH 324 (*)    All other components within normal limits  TRIGLYCERIDES - Abnormal; Notable for the following components:   Triglycerides 297 (*)    All other  components within normal limits  D-DIMER, QUANTITATIVE (NOT AT [facility]) - Abnormal; Notable for the following components:   D-Dimer, Quant 0.75 (*)    All other components within normal limits  FIBRINOGEN - Abnormal; Notable for the following components:   Fibrinogen 550 (*)    All other components within normal limits  CULTURE, BLOOD (ROUTINE X 2)  CULTURE, BLOOD (ROUTINE X 2)  SARS CORONAVIRUS 2 BY RT PCR (HOSPITAL ORDER, PERFORMED IN [place] [facility])  LACTIC ACID, PLASMA  PROCALCITONIN  C-REACTIVE PROTEIN  FERRITIN  I-STAT BETA HCG BLOOD, ED ([facility], [facility], [facility] ONLY)    EKG EKG Interpretation  Date/Time:  Friday March 02 2020 19:10:55 EDT Ventricular Rate:  104 PR Interval:    QRS Duration: 71 QT Interval:  345 QTC Calculation: 454 R Axis:   70 Text Interpretation: Sinus tachycardia Probable left atrial enlargement Since last tracing Rate faster Confirmed by Susy Frizzle [phone]) on 03/03/2020 10:24:54 AM   Radiology DG Chest Port 1 View  Result Date: 03/02/2020 CLINICAL DATA:  Hypoxemia, COVID positive EXAM: PORTABLE CHEST 1 VIEW COMPARISON:  February 29, 2020. FINDINGS: The heart size and mediastinal contours are within normal limits. Again noted is a hazy patchy airspace opacity at the periphery of the right lung base with slight interval worsening. There is also a hazy airspace opacity at the periphery of the left lung base. IMPRESSION: Slight interval worsening in the airspace opacities which could be due to multifocal pneumonia. Electronically Signed   By: Jonna Clark M.D.   On: 03/02/2020 20:06    Procedures Procedures (including critical care time)  Medications Ordered in ED Medications    sodium chloride flush (NS) 0.9 % injection 3 mL (has no administration in time range)  sodium chloride flush (NS) 0.9 % injection 3 mL (has no administration in time range)  0.9 %  sodium chloride infusion (has no administration in time range)  ondansetron (ZOFRAN) injection 4 mg (has no administration in time range)  acetaminophen (TYLENOL) tablet 1,000 mg (1,000 mg Oral Given 03/02/20 1846)    ED Course  I have reviewed the triage vital signs and the nursing notes.  Pertinent labs & imaging results that were available during my care of the patient were reviewed by me and considered in my medical decision making (see chart for details).    MDM Rules/Calculators/A&P                          48 year old female with Covid.  Persistent nausea/vomiting.  Borderline oxygen saturations.  Admission. Final Clinical Impression(s) / ED Diagnoses Final diagnoses:  COVID-19 virus infection    Rx / DC Orders ED Discharge Orders    None       Raeford Razor, MD 03/05/20 0021

## 2020-03-02 NOTE — H&P (Signed)
History and Physical    Amanda Frederick:[id number] DOB: [birth year] DOA: 03/02/2020  PCP: Paulina Fusi, MD  Patient coming from: Home  I have personally briefly reviewed patient's old medical records in [facility]  Chief Complaint: COVID, N/V  HPI: Amanda Frederick is a 49 y.o. female with medical history significant of IDDM.  Pt did not get COVID vaccine.  Pt began having symptoms of N/V/D, fevers, chills, cough.  Onset 02/19/20.  Symptoms progressively worsened.  Pt diagnosed with COVID-19 at [facility] a couple of days ago.  Symptoms have continued to worsen now with SOB.  Pt presents to ED due to worsening symptoms.    ED Course: COVID positive.  No O2 requirement at this time, satting 95% on RA currently at rest.  CXR: Slight interval worsening in the airspace opacities which could be due to multifocal pneumonia.  WBC nl, procalcitonin neg, CRP 4.9, D.Dimer 0.75.  Hospitalist asked to admit due to worsening COVID symptoms.   Review of Systems: As per HPI, otherwise all review of systems negative.  Past Medical History:  Diagnosis Date  . AC (acromioclavicular) joint bone spurs   . Anxiety   . Depression   . Diabetes (HCC)   . High blood pressure   . High cholesterol   . Migraines   . Neuropathy    eyes , hands and feet     Past Surgical History:  Procedure Laterality Date  . NASAL SINUS SURGERY    . TUBAL LIGATION    . VAGINAL HYSTERECTOMY       reports that she has never smoked. She has never used smokeless tobacco. She reports that she does not drink alcohol and does not use drugs.  Allergies  Allergen Reactions  . Other Hives    Antibiotic    Family History  Problem Relation Age of Onset  . Anemia Mother   . High Cholesterol Mother   . Diabetes Mother   . Arthritis Mother      Prior to Admission medications   Medication Sig Start Date End Date Taking? Authorizing Provider  albuterol (VENTOLIN HFA) 108 (90 Base) MCG/ACT inhaler Inhale 1 puff  into the lungs every 6 (six) hours as needed for wheezing or shortness of breath.   Yes [provider]  cholecalciferol (VITAMIN D) 1000 UNITS tablet Take 4,000 Units by mouth daily.   Yes [provider]  fish oil-omega-3 fatty acids 1000 MG capsule Take 2 g by mouth daily.   Yes [provider]  lisinopril (PRINIVIL,ZESTRIL) 10 MG tablet Take 20 mg by mouth daily.    Yes [provider]  NOVOLIN N RELION 100 UNIT/ML injection Inject 60 Units into the skin in the morning and at bedtime.  02/21/20  Yes [provider]  NOVOLIN R RELION 100 UNIT/ML injection Inject 20 Units into the skin 3 (three) times daily before meals.  12/15/19  Yes [provider]  dicyclomine (BENTYL) 10 MG capsule Take 10 mg by mouth 4 (four) times daily -  before meals and at bedtime. Patient not taking: Reported on 03/02/2020    [provider]  DULoxetine (CYMBALTA) 60 MG capsule Take 1 capsule (60 mg total) by mouth daily. Patient not taking: Reported on 03/02/2020 04/22/13   Levert Feinstein, MD  glucose blood (BAYER CONTOUR NEXT TEST) test strip Test 8-10 times per day 8-10 lancets/day Patient not taking: Reported on 03/02/2020 06/14/13   Romero Belling, MD  insulin aspart (NOVOLOG) 100 UNIT/ML injection For  use in pump, total of 120 units per day Patient not taking: Reported on 03/02/2020 06/14/13   Romero Belling, MD  Insulin Infusion Pump Supplies (PARADIGM QUICK-SET 32" ) MISC 1 Device by Does not apply route every 3 (three) days. Patient not taking: Reported on 03/02/2020    [provider]    Physical Exam: Vitals:   03/02/20 1936 03/02/20 1938 03/02/20 2015 03/02/20 2100  BP:   131/83 (!) 138/93  Pulse: 98  97 97  Resp: (!) 21  (!) 23 (!) 21  Temp:  100.1 F (37.8 C)    TempSrc:  Oral    SpO2: 96%  94% 95%    Constitutional: NAD, calm, comfortable Eyes: PERRL, lids and conjunctivae normal ENMT: Mucous membranes are moist. Posterior pharynx  clear of any exudate or lesions.Normal dentition.  Neck: normal, supple, no masses, no thyromegaly Respiratory: clear to auscultation bilaterally, no wheezing, no crackles. Normal respiratory effort. No accessory muscle use.  Cardiovascular: Regular rate and rhythm, no murmurs / rubs / gallops. No extremity edema. 2+ pedal pulses. No carotid bruits.  Abdomen: no tenderness, no masses palpated. No hepatosplenomegaly. Bowel sounds positive.  Musculoskeletal: no clubbing / cyanosis. No joint deformity upper and lower extremities. Good ROM, no contractures. Normal muscle tone.  Skin: no rashes, lesions, ulcers. No induration Neurologic: CN 2-12 grossly intact. Sensation intact, DTR normal. Strength 5/5 in all 4.  Psychiatric: Normal judgment and insight. Alert and oriented x 3. Normal mood.    Labs on Admission: I have personally reviewed following labs and imaging studies  CBC: Recent Labs  Lab 03/02/20 1855  WBC 10.4  NEUTROABS 8.4*  HGB 14.5  HCT 44.1  MCV 83.7  PLT 245   Basic Metabolic Panel: Recent Labs  Lab 03/02/20 1855  NA 140  K 3.0*  CL 104  CO2 23  GLUCOSE 153*  BUN 12  CREATININE 0.70  CALCIUM 8.5*   GFR: CrCl cannot be calculated (Unknown ideal weight.). Liver Function Tests: Recent Labs  Lab 03/02/20 1855  AST 23  ALT 18  ALKPHOS 65  BILITOT 0.9  PROT 6.7  ALBUMIN 3.1*   No results for input(s): LIPASE, AMYLASE in the last 168 hours. No results for input(s): AMMONIA in the last 168 hours. Coagulation Profile: No results for input(s): INR, PROTIME in the last 168 hours. Cardiac Enzymes: No results for input(s): CKTOTAL, CKMB, CKMBINDEX, TROPONINI in the last 168 hours. BNP (last 3 results) No results for input(s): PROBNP in the last 8760 hours. HbA1C: No results for input(s): HGBA1C in the last 72 hours. CBG: No results for input(s): GLUCAP in the last 168 hours. Lipid Profile: Recent Labs    03/02/20 1855  TRIG 297*   Thyroid Function  Tests: No results for input(s): TSH, T4TOTAL, FREET4, T3FREE, THYROIDAB in the last 72 hours. Anemia Panel: Recent Labs    03/02/20 1933  FERRITIN 434*   Urine analysis: No results found for: COLORURINE, APPEARANCEUR, LABSPEC, PHURINE, GLUCOSEU, HGBUR, BILIRUBINUR, KETONESUR, PROTEINUR, UROBILINOGEN, NITRITE, LEUKOCYTESUR  Radiological Exams on Admission: DG Chest Port 1 View  Result Date: 03/02/2020 CLINICAL DATA:  Hypoxemia, COVID positive EXAM: PORTABLE CHEST 1 VIEW COMPARISON:  February 29, 2020. FINDINGS: The heart size and mediastinal contours are within normal limits. Again noted is a hazy patchy airspace opacity at the periphery of the right lung base with slight interval worsening. There is also a hazy airspace opacity at the periphery of the left lung base. IMPRESSION: Slight interval worsening in the airspace  opacities which could be due to multifocal pneumonia. Electronically Signed   By: Jonna Clark M.D.   On: 03/02/2020 20:06    EKG: Independently reviewed.  Assessment/Plan Principal Problem:   COVID-19 virus infection Active Problems:   Diabetes (HCC)    1. COVID-19 1. Out of window for MAB treatment (12 days of symptoms now). 2. COVID pathway 3. Remdesivir 4. No O2 requirement so will hold off on Decadron for now 5. Cont pulse ox 6. Zofran PRN 7. Cough suppression 8. Clear liquid diet 9. Repeat labs in AM 2. DM - 1. Takes Novolin N 60u BID at baseline, only took 1 dose yesterday she says due to N/V 2. Will put on NPH 20u BID for the moment 3. Sensitive scale SSI Q4H  DVT prophylaxis: Lovenox Code Status: Full Family Communication: No family in room Disposition Plan: Home after N/V controlled Consults called: None Admission status: Place in 66   Meilyn Heindl M. DO [facility]  How to contact the [facility] Attending or Consulting provider 7A - 7P or covering provider during after hours 7P -7A, for this patient?  1. Check the care team in [facility] and  look for a) attending/consulting TR[facility] provider listed and b) the [facility] team listed 2. Log into [email or web address]  Amion Physician Scheduling and messaging for groups and whole hospitals  On call and physician scheduling software for group practices, residents, hospitalists and other medical providers for call, clinic, rotation and shift schedules. OnCall [employer] is a hospital-wide system for scheduling doctors and paging doctors on call. EasyPlot is for scientific plotting and data analysis.  [email or web address]  and use [facility]'s universal password to access. If you do not have the password, please contact the hospital operator.  3. Locate the [facility] provider you are looking for under [facility] and page to a number that you can be directly reached. 4. If you still have difficulty reaching the provider, please page the [facility] (Director on Call) for the Hospitalists listed on amion for assistance.  03/02/2020, 9:18 PM

## 2020-03-03 ENCOUNTER — Inpatient Hospital Stay (HOSPITAL_COMMUNITY): Payer: 59

## 2020-03-03 ENCOUNTER — Other Ambulatory Visit: Payer: Self-pay

## 2020-03-03 DIAGNOSIS — E118 Type 2 diabetes mellitus with unspecified complications: Secondary | ICD-10-CM

## 2020-03-03 DIAGNOSIS — Z8261 Family history of arthritis: Secondary | ICD-10-CM | POA: Diagnosis not present

## 2020-03-03 DIAGNOSIS — F419 Anxiety disorder, unspecified: Secondary | ICD-10-CM | POA: Diagnosis present

## 2020-03-03 DIAGNOSIS — IMO0002 Reserved for concepts with insufficient information to code with codable children: Secondary | ICD-10-CM | POA: Diagnosis present

## 2020-03-03 DIAGNOSIS — E876 Hypokalemia: Secondary | ICD-10-CM | POA: Diagnosis present

## 2020-03-03 DIAGNOSIS — J9601 Acute respiratory failure with hypoxia: Secondary | ICD-10-CM | POA: Diagnosis present

## 2020-03-03 DIAGNOSIS — E1140 Type 2 diabetes mellitus with diabetic neuropathy, unspecified: Secondary | ICD-10-CM | POA: Diagnosis present

## 2020-03-03 DIAGNOSIS — E785 Hyperlipidemia, unspecified: Secondary | ICD-10-CM | POA: Diagnosis present

## 2020-03-03 DIAGNOSIS — F320 Major depressive disorder, single episode, mild: Secondary | ICD-10-CM | POA: Diagnosis not present

## 2020-03-03 DIAGNOSIS — E1165 Type 2 diabetes mellitus with hyperglycemia: Secondary | ICD-10-CM

## 2020-03-03 DIAGNOSIS — Z794 Long term (current) use of insulin: Secondary | ICD-10-CM | POA: Diagnosis not present

## 2020-03-03 DIAGNOSIS — J1282 Pneumonia due to coronavirus disease 2019: Secondary | ICD-10-CM | POA: Diagnosis present

## 2020-03-03 DIAGNOSIS — Z83438 Family history of other disorder of lipoprotein metabolism and other lipidemia: Secondary | ICD-10-CM | POA: Diagnosis not present

## 2020-03-03 DIAGNOSIS — E0841 Diabetes mellitus due to underlying condition with diabetic mononeuropathy: Secondary | ICD-10-CM

## 2020-03-03 DIAGNOSIS — I10 Essential (primary) hypertension: Secondary | ICD-10-CM

## 2020-03-03 DIAGNOSIS — E7800 Pure hypercholesterolemia, unspecified: Secondary | ICD-10-CM

## 2020-03-03 DIAGNOSIS — U071 COVID-19: Secondary | ICD-10-CM | POA: Diagnosis present

## 2020-03-03 DIAGNOSIS — Z9641 Presence of insulin pump (external) (internal): Secondary | ICD-10-CM | POA: Diagnosis present

## 2020-03-03 DIAGNOSIS — F329 Major depressive disorder, single episode, unspecified: Secondary | ICD-10-CM | POA: Diagnosis present

## 2020-03-03 DIAGNOSIS — E139 Other specified diabetes mellitus without complications: Secondary | ICD-10-CM | POA: Diagnosis not present

## 2020-03-03 DIAGNOSIS — K529 Noninfective gastroenteritis and colitis, unspecified: Secondary | ICD-10-CM | POA: Diagnosis present

## 2020-03-03 DIAGNOSIS — Z833 Family history of diabetes mellitus: Secondary | ICD-10-CM | POA: Diagnosis not present

## 2020-03-03 LAB — CBC WITH DIFFERENTIAL/PLATELET
Abs Immature Granulocytes: 0.05 10*3/uL (ref 0.00–0.07)
Basophils Absolute: 0 10*3/uL (ref 0.0–0.1)
Basophils Relative: 0 %
Eosinophils Absolute: 0 10*3/uL (ref 0.0–0.5)
Eosinophils Relative: 0 %
HCT: 42 % (ref 36.0–46.0)
Hemoglobin: 13.7 g/dL (ref 12.0–15.0)
Immature Granulocytes: 1 %
Lymphocytes Relative: 23 %
Lymphs Abs: 1.6 10*3/uL (ref 0.7–4.0)
MCH: 28 pg (ref 26.0–34.0)
MCHC: 32.6 g/dL (ref 30.0–36.0)
MCV: 85.9 fL (ref 80.0–100.0)
Monocytes Absolute: 0.4 10*3/uL (ref 0.1–1.0)
Monocytes Relative: 5 %
Neutro Abs: 4.8 10*3/uL (ref 1.7–7.7)
Neutrophils Relative %: 71 %
Platelets: 199 10*3/uL (ref 150–400)
RBC: 4.89 MIL/uL (ref 3.87–5.11)
RDW: 12.1 % (ref 11.5–15.5)
WBC: 6.8 10*3/uL (ref 4.0–10.5)
nRBC: 0 % (ref 0.0–0.2)

## 2020-03-03 LAB — COMPREHENSIVE METABOLIC PANEL
ALT: 15 U/L (ref 0–44)
AST: 22 U/L (ref 15–41)
Albumin: 2.7 g/dL — ABNORMAL LOW (ref 3.5–5.0)
Alkaline Phosphatase: 60 U/L (ref 38–126)
Anion gap: 8 (ref 5–15)
BUN: 16 mg/dL (ref 6–20)
CO2: 27 mmol/L (ref 22–32)
Calcium: 8.1 mg/dL — ABNORMAL LOW (ref 8.9–10.3)
Chloride: 105 mmol/L (ref 98–111)
Creatinine, Ser: 0.82 mg/dL (ref 0.44–1.00)
GFR calc Af Amer: >60 mL/min (ref >60)
GFR calc non Af Amer: >60 mL/min (ref >60)
Glucose, Bld: 143 mg/dL — ABNORMAL HIGH (ref 70–99)
Potassium: 3.5 mmol/L (ref 3.5–5.1)
Sodium: 140 mmol/L (ref 135–145)
Total Bilirubin: 0.6 mg/dL (ref 0.3–1.2)
Total Protein: 6 g/dL — ABNORMAL LOW (ref 6.5–8.1)

## 2020-03-03 LAB — CBG MONITORING, ED
Glucose-Capillary: 101 mg/dL — ABNORMAL HIGH (ref 70–99)
Glucose-Capillary: 134 mg/dL — ABNORMAL HIGH (ref 70–99)
Glucose-Capillary: 215 mg/dL — ABNORMAL HIGH (ref 70–99)
Glucose-Capillary: 71 mg/dL (ref 70–99)
Glucose-Capillary: 74 mg/dL (ref 70–99)
Glucose-Capillary: 77 mg/dL (ref 70–99)
Glucose-Capillary: 88 mg/dL (ref 70–99)
Glucose-Capillary: 96 mg/dL (ref 70–99)

## 2020-03-03 LAB — C-REACTIVE PROTEIN: CRP: 5.2 mg/dL — ABNORMAL HIGH (ref <1.0)

## 2020-03-03 LAB — D-DIMER, QUANTITATIVE: D-Dimer, Quant: 0.86 ug/mL-FEU — ABNORMAL HIGH (ref 0.00–0.50)

## 2020-03-03 MED ORDER — SODIUM CHLORIDE 0.9 % IV SOLN: INTRAVENOUS | Status: DC

## 2020-03-03 MED ORDER — IOHEXOL 300 MG/ML  SOLN
100.0000 mL | Freq: Once | INTRAMUSCULAR | Status: AC | PRN
  Administered 2020-03-03: 100 mL via INTRAVENOUS

## 2020-03-03 MED ORDER — DULOXETINE HCL 60 MG PO CPEP
60.0000 mg | ORAL_CAPSULE | Freq: Every day | ORAL | Status: DC
  Administered 2020-03-04: 60 mg via ORAL
  Filled 2020-03-03 (×4): qty 1

## 2020-03-03 NOTE — ED Notes (Addendum)
Admitting MD paged x1 @ [phone].

## 2020-03-03 NOTE — Progress Notes (Signed)
PROGRESS NOTE    Amanda Frederick  NWG:[id number] DOB: 04-21-1971 DOA: 03/02/2020 PCP: Nicoletta Dress, MD     Brief Narrative:  49 y.o. WF PMHx Depression, Anxiety, DM type II uncontrolled with complication, DM neuropathy, HTN, HLD,.  Migraines,  Pt began having symptoms of N/V/D, fevers, chills, cough.  Onset 02/19/20.  Symptoms progressively worsened.  Pt diagnosed with COVID-19 at [facility] a couple of days ago.  Symptoms have continued to worsen now with SOB.  Pt presents to ED due to worsening symptoms.    ED Course: COVID positive.  No O2 requirement at this time, satting 95% on RA currently at rest.  CXR: Slight interval worsening in the airspace opacities which could be due to multifocal pneumonia.  WBC nl, procalcitonin neg, CRP 4.9, D.Dimer 0.75.  Hospitalist asked to admit due to worsening COVID symptoms.    Subjective: T-max overnight 39.1 C.  So x4, positive nausea positive vomiting positive abdominal pain positive diarrhea.  Patient balled up in fetal position.   Assessment & Plan: Covid vaccination; no vaccination   Principal Problem:   COVID-19 virus infection Active Problems:   Essential hypertension   Diabetes ([place])   High cholesterol   Depression   Diabetes mellitus type 2, uncontrolled, with complications ([place])   Diabetic neuropathy ([place])   Anxiety   HLD (hyperlipidemia)   Acute respiratory failure with hypoxia (HCC)  SIRS -Upon admission met criteria temp> 30 C, HR> 90,RR> 20 -Lactic acid= 1.5; and procalcitonin= 0.10, therefore does not meet criteria for sepsis -We will monitor closely   Acute respiratory failure with hypoxia/Covid 19 infection/ COVID-19 Labs  Recent Labs    03/02/20 1855 03/02/20 1933 03/03/20 0430  DDIMER  --  0.75* 0.86*  FERRITIN  --  434*  --   LDH 324*  --   --   CRP  --  4.9* 5.2*    Lab Results  Component Value Date   SARSCOV2NAA POSITIVE (A) 03/02/2020  -Remdesivir -8/7 patient with exquisitely tender  abdomen to palpation.  Obtain KUB to rule out SBO,vs ileus. -8/7 also obtain CT abdomen pelvis ordered Covid gut. -Findings on CT consistent with Covid pneumonitis, no acute GI findings.  Severe abdominal pain/CovidGut -Morphine 1 mg q 3 hours PRN -N.p.o., -Hydration normal saline 16m/hr  Essential HTN -Most likely secondary to pain. -See abdominal pain  HLD  Depression/Anxiety -Cymbalta 60 mg daily  DM type II uncontrolled with complication/DM neuropathy -8/6 hemoglobin A1c= 9.7 -Sensitive SSI     DVT prophylaxis: Lovenox Code Status: Full Family Communication:  Status is: Inpatient    Dispo: The patient is from: Home              Anticipated d/c is to: Home              Anticipated d/c date is: 8/9              Patient currently       Consultants:    Procedures/Significant Events:  8/7 CT abdomen and pelvis W contrast;-peripheral ground-glass opacities in the lower lobes consistent with COVID viral pneumonia. -No clear acute findings in the abdomen pelvis. -Small gas collection in the subcutaneous tissue of the RIGHT abdominal wall. Recommend clinical correlation with subcutaneous injection. 8/7 KUB; negative   I have personally reviewed and interpreted all radiology studies and my findings are as above.   VENTILATOR SETTINGS: Room air 8/7 SPO2; 96%   Cultures 8/6 SARS coronavirus positive 8/6 HIV screen negative 8/6 blood  x2  Antimicrobials: Anti-infectives (From admission, onward)   Start     Ordered Stop   03/03/20 1000  remdesivir 100 mg in sodium chloride 0.9 % 100 mL IVPB     Discontinue    "Followed by" Linked Group Details   03/02/20 2101 03/07/20 0959   03/02/20 2200  remdesivir 200 mg in sodium chloride 0.9% 250 mL IVPB       "Followed by" Linked Group Details   03/02/20 2101 03/02/20 2302       Devices    LINES / TUBES:      Continuous Infusions: . sodium chloride    . remdesivir 100 mg in NS 100 mL        Objective: Vitals:   03/03/20 0045 03/03/20 0100 03/03/20 0215 03/03/20 0430  BP: 112/81 119/88 118/84 (!) 143/92  Pulse: 80 81 78 88  Resp: 20 19 18  (!) 21  Temp:      TempSrc:      SpO2: 94% 94% 94% 91%   No intake or output data in the 24 hours ending 03/03/20 0757 There were no vitals filed for this visit.  Examination:  General: A/O x4, No acute respiratory distress Eyes: negative scleral hemorrhage, negative anisocoria, negative icterus ENT: Negative Runny nose, negative gingival bleeding, Neck:  Negative scars, masses, torticollis, lymphadenopathy, JVD Lungs: tachypneic clear to auscultation bilaterally without wheezes or crackles Cardiovascular: Tachycardic without murmur gallop or rub normal S1 and S2 Abdomen: Moderate to severe abdominal pain to palpation RUQ/LUQ>>> Remainder of abdomen, nondistended, decreased, bowel sounds, no rebound, no ascites, no appreciable mass Extremities: No significant cyanosis, clubbing, or edema bilateral lower extremities Skin: Negative rashes, lesions, ulcers Psychiatric:  Negative depression, negative anxiety, negative fatigue, negative mania  Central nervous system:  Cranial nerves II through XII intact, tongue/uvula midline, all extremities muscle strength 5/5, sensation intact throughout, negative dysarthria, negative expressive aphasia, negative receptive aphasia.  .     Data Reviewed: Care during the described time interval was provided by me .  I have reviewed this patient's available data, including medical history, events of note, physical examination, and all test results as part of my evaluation.  CBC: Recent Labs  Lab 03/02/20 1855 03/03/20 0430  WBC 10.4 6.8  NEUTROABS 8.4* 4.8  HGB 14.5 13.7  HCT 44.1 42.0  MCV 83.7 85.9  PLT 245 166   Basic Metabolic Panel: Recent Labs  Lab 03/02/20 1855 03/03/20 0430  NA 140 140  K 3.0* 3.5  CL 104 105  CO2 23 27  GLUCOSE 153* 143*  BUN 12 16  CREATININE 0.70  0.82  CALCIUM 8.5* 8.1*   GFR: CrCl cannot be calculated (Unknown ideal weight.). Liver Function Tests: Recent Labs  Lab 03/02/20 1855 03/03/20 0430  AST 23 22  ALT 18 15  ALKPHOS 65 60  BILITOT 0.9 0.6  PROT 6.7 6.0*  ALBUMIN 3.1* 2.7*   No results for input(s): LIPASE, AMYLASE in the last 168 hours. No results for input(s): AMMONIA in the last 168 hours. Coagulation Profile: No results for input(s): INR, PROTIME in the last 168 hours. Cardiac Enzymes: No results for input(s): CKTOTAL, CKMB, CKMBINDEX, TROPONINI in the last 168 hours. BNP (last 3 results) No results for input(s): PROBNP in the last 8760 hours. HbA1C: Recent Labs    03/02/20 2126  HGBA1C 9.7*   CBG: Recent Labs  Lab 03/02/20 2134 03/03/20 0006 03/03/20 0418  GLUCAP 179* 215* 134*   Lipid Profile: Recent Labs    03/02/20  1855  TRIG 297*   Thyroid Function Tests: No results for input(s): TSH, T4TOTAL, FREET4, T3FREE, THYROIDAB in the last 72 hours. Anemia Panel: Recent Labs    03/02/20 1933  FERRITIN 434*   Sepsis Labs: Recent Labs  Lab 03/02/20 1855 03/02/20 1933  PROCALCITON 0.10  --   LATICACIDVEN  --  1.5    Recent Results (from the past 240 hour(s))  SARS Coronavirus 2 by RT PCR (hospital order, performed in [facility] hospital lab) Nasopharyngeal Nasopharyngeal Swab     Status: Abnormal   Collection Time: 03/02/20  7:35 PM   Specimen: Nasopharyngeal Swab  Result Value Ref Range Status   SARS Coronavirus 2 POSITIVE (A) NEGATIVE Final    Comment: RESULT CALLED TO, READ BACK BY AND VERIFIED WITH: RESULT CALLED TO, READ BACK BY AND VERIFIED WITH: J GLOOSTER RN 03/02/20 2048 JDW (NOTE) SARS-CoV-2 target nucleic acids are DETECTED  SARS-CoV-2 RNA is generally detectable in upper respiratory specimens  during the acute phase of infection.  Positive results are indicative  of the presence of the identified virus, but do not rule out bacterial infection or co-infection with other  pathogens not detected by the test.  Clinical correlation with patient history and  other diagnostic information is necessary to determine patient infection status.  The expected result is negative.  Fact Sheet for Patients:   StrictlyIdeas.no   Fact Sheet for Healthcare Providers:   BankingDealers.co.za    This test is not yet approved or cleared by the [place] FDA and  has been authorized for detection and/or diagnosis of SARS-CoV-2 by FDA under an Emergency Use Authorization (EUA).  Th is EUA will remain in effect (meaning this test can be used) for the duration of  the COVID-19 declaration under Section 564(b)(1) of the Act, 21 U.S.C. section 360-bbb-3(b)(1), unless the authorization is terminated or revoked sooner.  Performed at [facility], [place] [address] [place], [place] [address]          Radiology Studies: DG Chest Port 1 View  Result Date: 03/02/2020 CLINICAL DATA:  Hypoxemia, COVID positive EXAM: PORTABLE CHEST 1 VIEW COMPARISON:  February 29, 2020. FINDINGS: The heart size and mediastinal contours are within normal limits. Again noted is a hazy patchy airspace opacity at the periphery of the right lung base with slight interval worsening. There is also a hazy airspace opacity at the periphery of the left lung base. IMPRESSION: Slight interval worsening in the airspace opacities which could be due to multifocal pneumonia. Electronically Signed   By: Prudencio Pair M.D.   On: 03/02/2020 20:06        Scheduled Meds: . enoxaparin (LOVENOX) injection  40 mg Subcutaneous Q24H  . insulin aspart  0-9 Units Subcutaneous Q4H  . insulin NPH Human  20 Units Subcutaneous BID AC & HS  . sodium chloride flush  3 mL Intravenous Q12H   Continuous Infusions: . sodium chloride    . remdesivir 100 mg in NS 100 mL       LOS: 0 days    Time spent:40 min   Kinze Labo, Geraldo Docker, MD [facility] Pager  [phone]  If 7PM-7AM, please contact night-coverage [email or web address] Password [facility] 03/03/2020, 7:57 AM

## 2020-03-03 NOTE — ED Notes (Signed)
Pt given OJ for low cbg.

## 2020-03-04 ENCOUNTER — Encounter (HOSPITAL_COMMUNITY): Payer: Self-pay | Admitting: Internal Medicine

## 2020-03-04 LAB — COMPREHENSIVE METABOLIC PANEL
ALT: 14 U/L (ref 0–44)
AST: 19 U/L (ref 15–41)
Albumin: 2.5 g/dL — ABNORMAL LOW (ref 3.5–5.0)
Alkaline Phosphatase: 56 U/L (ref 38–126)
Anion gap: 11 (ref 5–15)
BUN: 12 mg/dL (ref 6–20)
CO2: 24 mmol/L (ref 22–32)
Calcium: 7.7 mg/dL — ABNORMAL LOW (ref 8.9–10.3)
Chloride: 106 mmol/L (ref 98–111)
Creatinine, Ser: 0.58 mg/dL (ref 0.44–1.00)
GFR calc Af Amer: >60 mL/min (ref >60)
GFR calc non Af Amer: >60 mL/min (ref >60)
Glucose, Bld: 115 mg/dL — ABNORMAL HIGH (ref 70–99)
Potassium: 3.2 mmol/L — ABNORMAL LOW (ref 3.5–5.1)
Sodium: 141 mmol/L (ref 135–145)
Total Bilirubin: 0.5 mg/dL (ref 0.3–1.2)
Total Protein: 5.6 g/dL — ABNORMAL LOW (ref 6.5–8.1)

## 2020-03-04 LAB — MAGNESIUM: Magnesium: 1.6 mg/dL — ABNORMAL LOW (ref 1.7–2.4)

## 2020-03-04 LAB — FERRITIN: Ferritin: 351 ng/mL — ABNORMAL HIGH (ref 11–307)

## 2020-03-04 LAB — CBC WITH DIFFERENTIAL/PLATELET
Abs Immature Granulocytes: 0.06 10*3/uL (ref 0.00–0.07)
Basophils Absolute: 0 10*3/uL (ref 0.0–0.1)
Basophils Relative: 0 %
Eosinophils Absolute: 0 10*3/uL (ref 0.0–0.5)
Eosinophils Relative: 0 %
HCT: 42.7 % (ref 36.0–46.0)
Hemoglobin: 14.1 g/dL (ref 12.0–15.0)
Immature Granulocytes: 1 %
Lymphocytes Relative: 13 %
Lymphs Abs: 1.1 10*3/uL (ref 0.7–4.0)
MCH: 27.6 pg (ref 26.0–34.0)
MCHC: 33 g/dL (ref 30.0–36.0)
MCV: 83.7 fL (ref 80.0–100.0)
Monocytes Absolute: 0.5 10*3/uL (ref 0.1–1.0)
Monocytes Relative: 6 %
Neutro Abs: 6.7 10*3/uL (ref 1.7–7.7)
Neutrophils Relative %: 80 %
Platelets: 249 10*3/uL (ref 150–400)
RBC: 5.1 MIL/uL (ref 3.87–5.11)
RDW: 11.8 % (ref 11.5–15.5)
WBC: 8.4 10*3/uL (ref 4.0–10.5)
nRBC: 0 % (ref 0.0–0.2)

## 2020-03-04 LAB — CBG MONITORING, ED
Glucose-Capillary: 102 mg/dL — ABNORMAL HIGH (ref 70–99)
Glucose-Capillary: 113 mg/dL — ABNORMAL HIGH (ref 70–99)
Glucose-Capillary: 152 mg/dL — ABNORMAL HIGH (ref 70–99)
Glucose-Capillary: 188 mg/dL — ABNORMAL HIGH (ref 70–99)

## 2020-03-04 LAB — GLUCOSE, CAPILLARY
Glucose-Capillary: 190 mg/dL — ABNORMAL HIGH (ref 70–99)
Glucose-Capillary: 250 mg/dL — ABNORMAL HIGH (ref 70–99)
Glucose-Capillary: 351 mg/dL — ABNORMAL HIGH (ref 70–99)

## 2020-03-04 LAB — D-DIMER, QUANTITATIVE: D-Dimer, Quant: 0.56 ug/mL-FEU — ABNORMAL HIGH (ref 0.00–0.50)

## 2020-03-04 LAB — LACTATE DEHYDROGENASE: LDH: 298 U/L — ABNORMAL HIGH (ref 98–192)

## 2020-03-04 LAB — PHOSPHORUS: Phosphorus: 2.7 mg/dL (ref 2.5–4.6)

## 2020-03-04 LAB — C-REACTIVE PROTEIN: CRP: 5.4 mg/dL — ABNORMAL HIGH (ref <1.0)

## 2020-03-04 MED ORDER — BUTALBITAL-APAP-CAFFEINE 50-325-40 MG PO TABS
1.0000 | ORAL_TABLET | Freq: Once | ORAL | Status: AC
  Administered 2020-03-04: 1 via ORAL
  Filled 2020-03-04: qty 1

## 2020-03-04 MED ORDER — METHYLPREDNISOLONE SODIUM SUCC 125 MG IJ SOLR
60.0000 mg | INTRAMUSCULAR | Status: DC
  Administered 2020-03-04 – 2020-03-07 (×4): 60 mg via INTRAVENOUS
  Filled 2020-03-04 (×4): qty 2

## 2020-03-04 MED ORDER — PNEUMOCOCCAL VAC POLYVALENT 25 MCG/0.5ML IJ INJ: 0.5000 mL | INJECTION | INTRAMUSCULAR | Status: DC

## 2020-03-04 MED ORDER — ORAL CARE MOUTH RINSE
15.0000 mL | Freq: Two times a day (BID) | OROMUCOSAL | Status: DC
  Administered 2020-03-05 – 2020-03-07 (×5): 15 mL via OROMUCOSAL

## 2020-03-04 MED ORDER — DEXTROSE 50 % IV SOLN: 1.0000 | Freq: Once | INTRAVENOUS | Status: DC | PRN

## 2020-03-04 NOTE — Progress Notes (Signed)
PROGRESS NOTE    Amanda Frederick  HYI:[id number] DOB: [birth year] DOA: 03/02/2020 PCP: Nicoletta Dress, MD     Brief Narrative:  49 y.o. WF PMHx Depression, Anxiety, DM type II uncontrolled with complication, DM neuropathy, HTN, HLD,.  Migraines,  Pt began having symptoms of N/V/D, fevers, chills, cough.  Onset 02/19/20.  Symptoms progressively worsened.  Pt diagnosed with COVID-19 at [facility] a couple of days ago.  Symptoms have continued to worsen now with SOB.  Pt presents to ED due to worsening symptoms.    ED Course: COVID positive.  No O2 requirement at this time, satting 95% on RA currently at rest.  CXR: Slight interval worsening in the airspace opacities which could be due to multifocal pneumonia.  WBC nl, procalcitonin neg, CRP 4.9, D.Dimer 0.75.  Hospitalist asked to admit due to worsening COVID symptoms.    Subjective: 8/8 afebrile overnight A/O x4, states nausea has eased but still some mild abdominal pain.  Negative diarrhea.  Patient still appears uncomfortable but no longer balled up in fetal position.   Assessment & Plan: Covid vaccination; no vaccination   Principal Problem:   COVID-19 virus infection Active Problems:   Essential hypertension   Diabetes ([place])   High cholesterol   Depression   Diabetes mellitus type 2, uncontrolled, with complications ([place])   Diabetic neuropathy ([place])   Anxiety   HLD (hyperlipidemia)   Acute respiratory failure with hypoxia (HCC)  SIRS -Upon admission met criteria temp> 30 C, HR> 90,RR> 20 -Lactic acid= 1.5; and procalcitonin= 0.10, therefore does not meet criteria for sepsis -We will monitor closely   Acute respiratory failure with hypoxia/Covid 19 infection/ COVID-19 Labs  Recent Labs    03/02/20 1855 03/02/20 1933 03/03/20 0430 03/04/20 0747  DDIMER  --  0.75* 0.86* 0.56*  FERRITIN  --  434*  --  351*  LDH 324*  --   --  298*  CRP  --  4.9* 5.2* 5.4*    Lab Results  Component Value Date    SARSCOV2NAA POSITIVE (A) 03/02/2020  -Remdesivir -8/7 Solu-Medrol 60 mg daily -8/7 also obtain CT abdomen pelvis ordered Covid gut. -Findings on CT consistent with Covid pneumonitis, no acute GI findings.  Severe abdominal pain/CovidGut -Morphine 1 mg q 3 hours PRN -Hydration normal saline 39m/hr -8/7 we will try clear liquid diet  Essential HTN -Most likely secondary to pain. -See abdominal pain  HLD  Depression/Anxiety -Cymbalta 60 mg daily  DM type II uncontrolled with complication/DM neuropathy -8/6 hemoglobin A1c= 9.7 -Sensitive SSI     DVT prophylaxis: Lovenox Code Status: Full Family Communication:  Status is: Inpatient    Dispo: The patient is from: Home              Anticipated d/c is to: Home              Anticipated d/c date is: 8/9              Patient currently       Consultants:    Procedures/Significant Events:  8/7 CT abdomen and pelvis W contrast;-peripheral ground-glass opacities in the lower lobes consistent with COVID viral pneumonia. -No clear acute findings in the abdomen pelvis. -Small gas collection in the subcutaneous tissue of the RIGHT abdominal wall. Recommend clinical correlation with subcutaneous injection. 8/7 KUB; negative   I have personally reviewed and interpreted all radiology studies and my findings are as above.   VENTILATOR SETTINGS: Room air 8/8 SPO2; 94%   Cultures 8/6  SARS coronavirus positive 8/6 HIV screen negative 8/6 blood x2  Antimicrobials: Anti-infectives (From admission, onward)   Start     Ordered Stop   03/03/20 1000  remdesivir 100 mg in sodium chloride 0.9 % 100 mL IVPB     Discontinue    "Followed by" Linked Group Details   03/02/20 2101 03/07/20 0959   03/02/20 2200  remdesivir 200 mg in sodium chloride 0.9% 250 mL IVPB       "Followed by" Linked Group Details   03/02/20 2101 03/02/20 2302       Devices    LINES / TUBES:      Continuous Infusions: . sodium chloride    .  sodium chloride 75 mL/hr at 03/03/20 1910  . remdesivir 100 mg in NS 100 mL 100 mg (03/04/20 0925)     Objective: Vitals:   03/03/20 2203 03/03/20 2252 03/03/20 2302 03/04/20 0745  BP:   (!) 156/93 (!) 154/95  Pulse:   96 91  Resp: 20 14  (!) 21  Temp:      TempSrc:      SpO2:   90% 92%  Weight:      Height:        Intake/Output Summary (Last 24 hours) at 03/04/2020 0932 Last data filed at 03/03/2020 1147 Gross per 24 hour  Intake 99.89 ml  Output --  Net 99.89 ml   Filed Weights   03/03/20 2015  Weight: 77.1 kg   Physical Exam:  General: A/O x4, No acute respiratory distress Eyes: negative scleral hemorrhage, negative anisocoria, negative icterus ENT: Negative Runny nose, negative gingival bleeding, Neck:  Negative scars, masses, torticollis, lymphadenopathy, JVD Lungs: Clear to auscultation bilaterally without wheezes or crackles Cardiovascular: Regular rate and rhythm without murmur gallop or rub normal S1 and S2 Abdomen: Positive mild generalized abdominal pain, nondistended, hypoactive bowel sounds, no rebound, no ascites, no appreciable mass Extremities: No significant cyanosis, clubbing, or edema bilateral lower extremities Skin: Negative rashes, lesions, ulcers Psychiatric:  Negative depression, negative anxiety, negative fatigue, negative mania  Central nervous system:  Cranial nerves II through XII intact, tongue/uvula midline, all extremities muscle strength 5/5, sensation intact throughout, negative dysarthria, negative expressive aphasia, negative receptive aphasia.   .     Data Reviewed: Care during the described time interval was provided by me .  I have reviewed this patient's available data, including medical history, events of note, physical examination, and all test results as part of my evaluation.  CBC: Recent Labs  Lab 03/02/20 1855 03/03/20 0430 03/04/20 0747  WBC 10.4 6.8 8.4  NEUTROABS 8.4* 4.8 6.7  HGB 14.5 13.7 14.1  HCT 44.1 42.0 42.7   MCV 83.7 85.9 83.7  PLT 245 199 038   Basic Metabolic Panel: Recent Labs  Lab 03/02/20 1855 03/03/20 0430 03/04/20 0747  NA 140 140 141  K 3.0* 3.5 3.2*  CL 104 105 106  CO2 23 27 24   GLUCOSE 153* 143* 115*  BUN 12 16 12   CREATININE 0.70 0.82 0.58  CALCIUM 8.5* 8.1* 7.7*  MG  --   --  1.6*  PHOS  --   --  2.7   GFR: Estimated Creatinine Clearance: 89.2 mL/min (by C-G formula based on SCr of 0.58 mg/dL). Liver Function Tests: Recent Labs  Lab 03/02/20 1855 03/03/20 0430 03/04/20 0747  AST 23 22 19   ALT 18 15 14   ALKPHOS 65 60 56  BILITOT 0.9 0.6 0.5  PROT 6.7 6.0* 5.6*  ALBUMIN 3.1* 2.7*  2.5*   No results for input(s): LIPASE, AMYLASE in the last 168 hours. No results for input(s): AMMONIA in the last 168 hours. Coagulation Profile: No results for input(s): INR, PROTIME in the last 168 hours. Cardiac Enzymes: No results for input(s): CKTOTAL, CKMB, CKMBINDEX, TROPONINI in the last 168 hours. BNP (last 3 results) No results for input(s): PROBNP in the last 8760 hours. HbA1C: Recent Labs    03/02/20 2126  HGBA1C 9.7*   CBG: Recent Labs  Lab 03/03/20 1833 03/03/20 2045 03/03/20 2231 03/04/20 0154 03/04/20 0717  GLUCAP 96 88 77 102* 113*   Lipid Profile: Recent Labs    03/02/20 1855  TRIG 297*   Thyroid Function Tests: No results for input(s): TSH, T4TOTAL, FREET4, T3FREE, THYROIDAB in the last 72 hours. Anemia Panel: Recent Labs    03/02/20 1933 03/04/20 0747  FERRITIN 434* 351*   Sepsis Labs: Recent Labs  Lab 03/02/20 1855 03/02/20 1933  PROCALCITON 0.10  --   LATICACIDVEN  --  1.5    Recent Results (from the past 240 hour(s))  Blood Culture (routine x 2)     Status: None (Preliminary result)   Collection Time: 03/02/20  7:32 PM   Specimen: BLOOD  Result Value Ref Range Status   Specimen Description BLOOD RIGHT ANTECUBITAL  Final   Special Requests   Final    BOTTLES DRAWN AEROBIC AND ANAEROBIC Blood Culture adequate volume    Culture   Final    NO GROWTH < 12 HOURS Performed at [facility], [place] [address], [place], [place] [address]    Report Status PENDING  Incomplete  Blood Culture (routine x 2)     Status: None (Preliminary result)   Collection Time: 03/02/20  7:35 PM   Specimen: BLOOD  Result Value Ref Range Status   Specimen Description BLOOD LEFT ANTECUBITAL  Final   Special Requests   Final    BOTTLES DRAWN AEROBIC AND ANAEROBIC Blood Culture adequate volume   Culture   Final    NO GROWTH < 12 HOURS Performed at [facility], [place] [address] [place], [place] [address]    Report Status PENDING  Incomplete  SARS Coronavirus 2 by RT PCR (hospital order, performed in [facility] hospital lab) Nasopharyngeal Nasopharyngeal Swab     Status: Abnormal   Collection Time: 03/02/20  7:35 PM   Specimen: Nasopharyngeal Swab  Result Value Ref Range Status   SARS Coronavirus 2 POSITIVE (A) NEGATIVE Final    Comment: RESULT CALLED TO, READ BACK BY AND VERIFIED WITH: RESULT CALLED TO, READ BACK BY AND VERIFIED WITH: J GLOOSTER RN 03/02/20 2048 JDW (NOTE) SARS-CoV-2 target nucleic acids are DETECTED  SARS-CoV-2 RNA is generally detectable in upper respiratory specimens  during the acute phase of infection.  Positive results are indicative  of the presence of the identified virus, but do not rule out bacterial infection or co-infection with other pathogens not detected by the test.  Clinical correlation with patient history and  other diagnostic information is necessary to determine patient infection status.  The expected result is negative.  Fact Sheet for Patients:   StrictlyIdeas.no   Fact Sheet for Healthcare Providers:   BankingDealers.co.za    This test is not yet approved or cleared by the [place] FDA and  has been authorized for detection and/or diagnosis of SARS-CoV-2 by FDA under an Emergency Use Authorization (EUA).  Th is EUA  will remain in effect (meaning this test can be used)  for the duration of  the COVID-19 declaration under Section 564(b)(1) of the Act, 21 U.S.C. section 360-bbb-3(b)(1), unless the authorization is terminated or revoked sooner.  Performed at [facility], [place] [address] [place], [place] [address]          Radiology Studies: DG Abd 1 View  Result Date: 03/03/2020 CLINICAL DATA:  Nausea, vomiting, fevers, chills, cough. New diagnosis of [facility] a couple days ago. Worsening shortness of breath. EXAM: ABDOMEN - 1 VIEW COMPARISON:  None. FINDINGS: The bowel gas pattern is normal. No radio-opaque calculi or other significant radiographic abnormality are seen. IMPRESSION: Negative. Electronically Signed   By: Nolon Nations M.D.   On: 03/03/2020 13:15   CT ABDOMEN PELVIS W CONTRAST  Result Date: 03/03/2020 CLINICAL DATA:  Intra-abdominal peritonitis.  Positive COVID test. EXAM: CT ABDOMEN AND PELVIS WITH CONTRAST TECHNIQUE: Multidetector CT imaging of the abdomen and pelvis was performed using the standard protocol following bolus administration of intravenous contrast. CONTRAST:  145m OMNIPAQUE IOHEXOL 300 MG/ML  SOLN COMPARISON:  Chest radiograph 03/02/2020 FINDINGS: Lower chest: Peripheral ground-glass opacities in the LEFT RIGHT lower lobe consistent with COVID viral pneumonia. Hepatobiliary: No focal hepatic lesion. No biliary duct dilatation. Common bile duct is normal. Pancreas: Pancreas is normal. No ductal dilatation. No pancreatic inflammation. Spleen: Normal spleen Adrenals/urinary tract: Adrenal glands, kidneys, ureters and bladder normal. Stomach/Bowel: Stomach, small bowel, appendix, and cecum are normal. The colon and rectosigmoid colon are normal. Vascular/Lymphatic: Abdominal aorta is normal caliber. No periportal or retroperitoneal adenopathy. No pelvic adenopathy. Reproductive: Post hysterectomy.  Adnexa unremarkable Other: Small gas collection in  the RIGHT abdominal wall above the umbilicus measuring 1.4 by 1.5 cm on image 43/4. Musculoskeletal: No aggressive osseous lesion. IMPRESSION: 1. Peripheral ground-glass opacities in the lower lobes consistent with COVID viral pneumonia. 2. No clear acute findings in the abdomen pelvis. 3. Small gas collection in the subcutaneous tissue of the RIGHT abdominal wall. Recommend clinical correlation with subcutaneous injection. Electronically Signed   By: SSuzy BouchardM.D.   On: 03/03/2020 14:01   DG Chest Port 1 View  Result Date: 03/02/2020 CLINICAL DATA:  Hypoxemia, COVID positive EXAM: PORTABLE CHEST 1 VIEW COMPARISON:  February 29, 2020. FINDINGS: The heart size and mediastinal contours are within normal limits. Again noted is a hazy patchy airspace opacity at the periphery of the right lung base with slight interval worsening. There is also a hazy airspace opacity at the periphery of the left lung base. IMPRESSION: Slight interval worsening in the airspace opacities which could be due to multifocal pneumonia. Electronically Signed   By: BPrudencio PairM.D.   On: 03/02/2020 20:06        Scheduled Meds: . DULoxetine  60 mg Oral Daily  . enoxaparin (LOVENOX) injection  40 mg Subcutaneous Q24H  . insulin aspart  0-9 Units Subcutaneous Q4H  . sodium chloride flush  3 mL Intravenous Q12H   Continuous Infusions: . sodium chloride    . sodium chloride 75 mL/hr at 03/03/20 1910  . remdesivir 100 mg in NS 100 mL 100 mg (03/04/20 0925)     LOS: 1 day    Time spent:40 min   Herta Hink, CGeraldo Docker MD [facility] Pager [phone] If 7PM-7AM, please contact night-coverage [email or web address] Password [facility]8/02/2020, 9:32 AM

## 2020-03-04 NOTE — ED Notes (Addendum)
Pt ambulated in exam room on pulse ox. O2 saturation was at 93% upon standing, dropping to 86% while ambulating. Gait slow and steady. Pt complained of experiencing waves of dizziness that comes and goes. O2 saturation went back to 92% when pt got back in stretcher. RN, Raynelle Fanning, notified.

## 2020-03-04 NOTE — ED Notes (Signed)
Pt placed on 2L [place] due to oxygen saturations remaining in the upper 80's.

## 2020-03-04 NOTE — ED Notes (Signed)
Lunch tray delivered.

## 2020-03-04 NOTE — ED Notes (Addendum)
This tech went into pt's room to see if she was willing to get out of bed to walk around the room for a moment. Pt refused. RN, Raynelle Fanning, notified.

## 2020-03-05 LAB — CBC WITH DIFFERENTIAL/PLATELET
Abs Immature Granulocytes: 0.05 10*3/uL (ref 0.00–0.07)
Basophils Absolute: 0 10*3/uL (ref 0.0–0.1)
Basophils Relative: 0 %
Eosinophils Absolute: 0 10*3/uL (ref 0.0–0.5)
Eosinophils Relative: 0 %
HCT: 40.4 % (ref 36.0–46.0)
Hemoglobin: 13.7 g/dL (ref 12.0–15.0)
Immature Granulocytes: 1 %
Lymphocytes Relative: 6 %
Lymphs Abs: 0.4 10*3/uL — ABNORMAL LOW (ref 0.7–4.0)
MCH: 27.8 pg (ref 26.0–34.0)
MCHC: 33.9 g/dL (ref 30.0–36.0)
MCV: 81.9 fL (ref 80.0–100.0)
Monocytes Absolute: 0.2 10*3/uL (ref 0.1–1.0)
Monocytes Relative: 4 %
Neutro Abs: 5.8 10*3/uL (ref 1.7–7.7)
Neutrophils Relative %: 89 %
Platelets: 271 10*3/uL (ref 150–400)
RBC: 4.93 MIL/uL (ref 3.87–5.11)
RDW: 11.6 % (ref 11.5–15.5)
WBC: 6.4 10*3/uL (ref 4.0–10.5)
nRBC: 0 % (ref 0.0–0.2)

## 2020-03-05 LAB — GLUCOSE, CAPILLARY
Glucose-Capillary: 238 mg/dL — ABNORMAL HIGH (ref 70–99)
Glucose-Capillary: 200 mg/dL — ABNORMAL HIGH (ref 70–99)
Glucose-Capillary: 215 mg/dL — ABNORMAL HIGH (ref 70–99)
Glucose-Capillary: 321 mg/dL — ABNORMAL HIGH (ref 70–99)
Glucose-Capillary: 375 mg/dL — ABNORMAL HIGH (ref 70–99)

## 2020-03-05 LAB — COMPREHENSIVE METABOLIC PANEL
ALT: 13 U/L (ref 0–44)
AST: 13 U/L — ABNORMAL LOW (ref 15–41)
Albumin: 2.4 g/dL — ABNORMAL LOW (ref 3.5–5.0)
Alkaline Phosphatase: 59 U/L (ref 38–126)
Anion gap: 11 (ref 5–15)
BUN: 13 mg/dL (ref 6–20)
CO2: 23 mmol/L (ref 22–32)
Calcium: 7.9 mg/dL — ABNORMAL LOW (ref 8.9–10.3)
Chloride: 104 mmol/L (ref 98–111)
Creatinine, Ser: 0.52 mg/dL (ref 0.44–1.00)
GFR calc Af Amer: >60 mL/min (ref >60)
GFR calc non Af Amer: >60 mL/min (ref >60)
Glucose, Bld: 275 mg/dL — ABNORMAL HIGH (ref 70–99)
Potassium: 3.4 mmol/L — ABNORMAL LOW (ref 3.5–5.1)
Sodium: 138 mmol/L (ref 135–145)
Total Bilirubin: 0.4 mg/dL (ref 0.3–1.2)
Total Protein: 5.4 g/dL — ABNORMAL LOW (ref 6.5–8.1)

## 2020-03-05 LAB — FERRITIN: Ferritin: 418 ng/mL — ABNORMAL HIGH (ref 11–307)

## 2020-03-05 LAB — C-REACTIVE PROTEIN: CRP: 6.8 mg/dL — ABNORMAL HIGH (ref <1.0)

## 2020-03-05 LAB — PHOSPHORUS: Phosphorus: 2.6 mg/dL (ref 2.5–4.6)

## 2020-03-05 LAB — LACTATE DEHYDROGENASE: LDH: 273 U/L — ABNORMAL HIGH (ref 98–192)

## 2020-03-05 LAB — D-DIMER, QUANTITATIVE: D-Dimer, Quant: 0.58 ug/mL-FEU — ABNORMAL HIGH (ref 0.00–0.50)

## 2020-03-05 LAB — MAGNESIUM: Magnesium: 1.9 mg/dL (ref 1.7–2.4)

## 2020-03-05 MED ORDER — POTASSIUM CHLORIDE 10 MEQ/100ML IV SOLN
10.0000 meq | INTRAVENOUS | Status: AC
  Administered 2020-03-05 (×5): 10 meq via INTRAVENOUS
  Filled 2020-03-05 (×5): qty 100

## 2020-03-05 NOTE — Plan of Care (Signed)

## 2020-03-05 NOTE — Progress Notes (Signed)
PROGRESS NOTE    Amanda Frederick  ZOX:[id number] DOB: December 29, 1970 DOA: 03/02/2020 PCP: Nicoletta Dress, MD     Brief Narrative:  49 y.o. WF PMHx Depression, Anxiety, DM type II uncontrolled with complication, DM neuropathy, HTN, HLD,.  Migraines,  Pt began having symptoms of N/V/D, fevers, chills, cough.  Onset 02/19/20.  Symptoms progressively worsened.  Pt diagnosed with COVID-19 at [facility] a couple of days ago.  Symptoms have continued to worsen now with SOB.  Pt presents to ED due to worsening symptoms.    ED Course: COVID positive.  No O2 requirement at this time, satting 95% on RA currently at rest.  CXR: Slight interval worsening in the airspace opacities which could be due to multifocal pneumonia.  WBC nl, procalcitonin neg, CRP 4.9, D.Dimer 0.75.  Hospitalist asked to admit due to worsening COVID symptoms.    Subjective: 8/9 afebrile overnight, A/O x4, abdominal pain has decreased.  Abdominal pain mostly RUQ.  Negative nausea, negative vomiting.  Would like to try clear liquid diet.    Assessment & Plan: Covid vaccination; no vaccination   Principal Problem:   COVID-19 virus infection Active Problems:   Essential hypertension   Diabetes ([place])   High cholesterol   Depression   Diabetes mellitus type 2, uncontrolled, with complications ([place])   Diabetic neuropathy ([place])   Anxiety   HLD (hyperlipidemia)   Acute respiratory failure with hypoxia (HCC)  SIRS -Upon admission met criteria temp> 30 C, HR> 90,RR> 20 -Lactic acid= 1.5; and procalcitonin= 0.10, therefore does not meet criteria for sepsis -We will monitor closely   Acute respiratory failure with hypoxia/Covid 19 infection/ COVID-19 Labs  Recent Labs    03/02/20 1855 03/02/20 1933 03/02/20 1933 03/03/20 0430 03/04/20 0747 03/05/20 0248  DDIMER  --  0.75*   < > 0.86* 0.56* 0.58*  FERRITIN  --  434*  --   --  351* 418*  LDH 324*  --   --   --  298* 273*  CRP  --  4.9*   < > 5.2* 5.4* 6.8*    < > = values in this interval not displayed.    Lab Results  Component Value Date   SARSCOV2NAA POSITIVE (A) 03/02/2020  -Remdesivir -8/7 Solu-Medrol 60 mg daily -8/7 also obtain CT abdomen pelvis ordered Covid gut. -Findings on CT consistent with Covid pneumonitis, no acute GI findings.  Severe abdominal pain/Covid Gastroenteritis  -Morphine 1 mg q 3 hours PRN -Hydration normal saline 57m/hr -8/9 we will try clear liquid diet  Essential HTN -Most likely secondary to pain. -See abdominal pain  HLD  Depression/Anxiety -Cymbalta 60 mg daily  DM type II uncontrolled with complication/DM neuropathy -8/6 hemoglobin A1c= 9.7 -Sensitive SSI  Hypokalemia -Potassium goal> 4 -Potassium IV 50 mEq   DVT prophylaxis: Lovenox Code Status: Full Family Communication:  Status is: Inpatient    Dispo: The patient is from: Home              Anticipated d/c is to: Home              Anticipated d/c date is: 8/9              Patient currently       Consultants:    Procedures/Significant Events:  8/7 CT abdomen and pelvis W contrast;-peripheral ground-glass opacities in the lower lobes consistent with COVID viral pneumonia. -No clear acute findings in the abdomen pelvis. -Small gas collection in the subcutaneous tissue of the RIGHT abdominal wall.  Recommend clinical correlation with subcutaneous injection. 8/7 KUB; negative   I have personally reviewed and interpreted all radiology studies and my findings are as above.   VENTILATOR SETTINGS: Room air 8/9 SPO2; 94%   Cultures 8/6 SARS coronavirus positive 8/6 HIV screen negative 8/6 blood x2  Antimicrobials: Anti-infectives (From admission, onward)   Start     Ordered Stop   03/03/20 1000  remdesivir 100 mg in sodium chloride 0.9 % 100 mL IVPB     Discontinue    "Followed by" Linked Group Details   03/02/20 2101 03/07/20 0959   03/02/20 2200  remdesivir 200 mg in sodium chloride 0.9% 250 mL IVPB       "Followed  by" Linked Group Details   03/02/20 2101 03/02/20 2302       Devices    LINES / TUBES:      Continuous Infusions: . sodium chloride    . sodium chloride 75 mL/hr at 03/05/20 0624  . remdesivir 100 mg in NS 100 mL Stopped (03/04/20 1046)     Objective: Vitals:   03/04/20 1700 03/04/20 1951 03/05/20 0047 03/05/20 0433  BP: (!) 146/93 134/89 119/66 140/90  Pulse: 89 87  92  Resp: 20 (!) 22  18  Temp: 98.9 F (37.2 C) 99 F (37.2 C) 99.2 F (37.3 C) 97.7 F (36.5 C)  TempSrc: Oral Oral Oral Oral  SpO2: 94% 94%  95%  Weight:      Height:        Intake/Output Summary (Last 24 hours) at 03/05/2020 0703 Last data filed at 03/04/2020 1046 Gross per 24 hour  Intake 100.11 ml  Output --  Net 100.11 ml   Filed Weights   03/03/20 2015  Weight: 77.1 kg   Physical Exam:  General: A/O x4, No acute respiratory distress Eyes: negative scleral hemorrhage, negative anisocoria, negative icterus ENT: Negative Runny nose, negative gingival bleeding, Neck:  Negative scars, masses, torticollis, lymphadenopathy, JVD Lungs: Clear to auscultation bilaterally without wheezes or crackles Cardiovascular: Regular rate and rhythm without murmur gallop or rub normal S1 and S2 Abdomen: Positive mild generalized abdominal pain, nondistended, hypoactive bowel sounds, no rebound, no ascites, no appreciable mass Extremities: No significant cyanosis, clubbing, or edema bilateral lower extremities Skin: Negative rashes, lesions, ulcers Psychiatric:  Negative depression, negative anxiety, negative fatigue, negative mania  Central nervous system:  Cranial nerves II through XII intact, tongue/uvula midline, all extremities muscle strength 5/5, sensation intact throughout, negative dysarthria, negative expressive aphasia, negative receptive aphasia.   .     Data Reviewed: Care during the described time interval was provided by me .  I have reviewed this patient's available data, including medical  history, events of note, physical examination, and all test results as part of my evaluation.  CBC: Recent Labs  Lab 03/02/20 1855 03/03/20 0430 03/04/20 0747 03/05/20 0248  WBC 10.4 6.8 8.4 6.4  NEUTROABS 8.4* 4.8 6.7 5.8  HGB 14.5 13.7 14.1 13.7  HCT 44.1 42.0 42.7 40.4  MCV 83.7 85.9 83.7 81.9  PLT 245 199 249 010   Basic Metabolic Panel: Recent Labs  Lab 03/02/20 1855 03/03/20 0430 03/04/20 0747 03/05/20 0248  NA 140 140 141 138  K 3.0* 3.5 3.2* 3.4*  CL 104 105 106 104  CO2 _0 GLUCOSE 153* 143* 115* 275*  BUN _1 CREATININE 0.70 0.82 0.58 0.52  CALCIUM 8.5* 8.1* 7.7* 7.9*  MG  --   --  1.6* 1.9  PHOS  --   --  2.7 2.6   GFR: Estimated Creatinine Clearance: 89.2 mL/min (by C-G formula based on SCr of 0.52 mg/dL). Liver Function Tests: Recent Labs  Lab 03/02/20 1855 03/03/20 0430 03/04/20 0747 03/05/20 0248  AST _0 13*  ALT _1 ALKPHOS 65 60 56 59  BILITOT 0.9 0.6 0.5 0.4  PROT 6.7 6.0* 5.6* 5.4*  ALBUMIN 3.1* 2.7* 2.5* 2.4*   No results for input(s): LIPASE, AMYLASE in the last 168 hours. No results for input(s): AMMONIA in the last 168 hours. Coagulation Profile: No results for input(s): INR, PROTIME in the last 168 hours. Cardiac Enzymes: No results for input(s): CKTOTAL, CKMB, CKMBINDEX, TROPONINI in the last 168 hours. BNP (last 3 results) No results for input(s): PROBNP in the last 8760 hours. HbA1C: Recent Labs    03/02/20 2126  HGBA1C 9.7*   CBG: Recent Labs  Lab 03/04/20 1619 03/04/20 1716 03/04/20 1953 03/04/20 2356 03/05/20 0424  GLUCAP 188* 190* 351* 250* 238*   Lipid Profile: Recent Labs    03/02/20 1855  TRIG 297*   Thyroid Function Tests: No results for input(s): TSH, T4TOTAL, FREET4, T3FREE, THYROIDAB in the last 72 hours. Anemia Panel: Recent Labs    03/04/20 0747 03/05/20 0248  FERRITIN 351* 418*   Sepsis Labs: Recent Labs  Lab 03/02/20 1855 03/02/20 1933  PROCALCITON  0.10  --   LATICACIDVEN  --  1.5    Recent Results (from the past 240 hour(s))  Blood Culture (routine x 2)     Status: None (Preliminary result)   Collection Time: 03/02/20  7:32 PM   Specimen: BLOOD  Result Value Ref Range Status   Specimen Description BLOOD RIGHT ANTECUBITAL  Final   Special Requests   Final    BOTTLES DRAWN AEROBIC AND ANAEROBIC Blood Culture adequate volume   Culture   Final    NO GROWTH 2 DAYS Performed at [facility], [place] [address], [place], [place] [address]    Report Status PENDING  Incomplete  Blood Culture (routine x 2)     Status: None (Preliminary result)   Collection Time: 03/02/20  7:35 PM   Specimen: BLOOD  Result Value Ref Range Status   Specimen Description BLOOD LEFT ANTECUBITAL  Final   Special Requests   Final    BOTTLES DRAWN AEROBIC AND ANAEROBIC Blood Culture adequate volume   Culture   Final    NO GROWTH 2 DAYS Performed at [facility], [place] [address] [place], [place] [address]    Report Status PENDING  Incomplete  SARS Coronavirus 2 by RT PCR (hospital order, performed in [facility] hospital lab) Nasopharyngeal Nasopharyngeal Swab     Status: Abnormal   Collection Time: 03/02/20  7:35 PM   Specimen: Nasopharyngeal Swab  Result Value Ref Range Status   SARS Coronavirus 2 POSITIVE (A) NEGATIVE Final    Comment: RESULT CALLED TO, READ BACK BY AND VERIFIED WITH: RESULT CALLED TO, READ BACK BY AND VERIFIED WITH: J GLOOSTER RN 03/02/20 2048 JDW (NOTE) SARS-CoV-2 target nucleic acids are DETECTED  SARS-CoV-2 RNA is generally detectable in upper respiratory specimens  during the acute phase of infection.  Positive results are indicative  of the presence of the identified virus, but do not rule out bacterial infection or co-infection with other pathogens not detected by the test.  Clinical correlation with patient history and  other diagnostic information is necessary to determine patient infection status.  The expected  result is negative.  Fact Sheet for Patients:   StrictlyIdeas.no   Fact Sheet for Healthcare Providers:   BankingDealers.co.za    This test is not yet approved or cleared by the [place] FDA and  has been authorized for detection and/or diagnosis of SARS-CoV-2 by FDA under an Emergency Use Authorization (EUA).  Th is EUA will remain in effect (meaning this test can be used) for the duration of  the COVID-19 declaration under Section 564(b)(1) of the Act, 21 U.S.C. section 360-bbb-3(b)(1), unless the authorization is terminated or revoked sooner.  Performed at [facility], [place] [address] [place], [place] [address]          Radiology Studies: DG Abd 1 View  Result Date: 03/03/2020 CLINICAL DATA:  Nausea, vomiting, fevers, chills, cough. New diagnosis of [facility] a couple days ago. Worsening shortness of breath. EXAM: ABDOMEN - 1 VIEW COMPARISON:  None. FINDINGS: The bowel gas pattern is normal. No radio-opaque calculi or other significant radiographic abnormality are seen. IMPRESSION: Negative. Electronically Signed   By: Nolon Nations M.D.   On: 03/03/2020 13:15   CT ABDOMEN PELVIS W CONTRAST  Result Date: 03/03/2020 CLINICAL DATA:  Intra-abdominal peritonitis.  Positive COVID test. EXAM: CT ABDOMEN AND PELVIS WITH CONTRAST TECHNIQUE: Multidetector CT imaging of the abdomen and pelvis was performed using the standard protocol following bolus administration of intravenous contrast. CONTRAST:  150m OMNIPAQUE IOHEXOL 300 MG/ML  SOLN COMPARISON:  Chest radiograph 03/02/2020 FINDINGS: Lower chest: Peripheral ground-glass opacities in the LEFT RIGHT lower lobe consistent with COVID viral pneumonia. Hepatobiliary: No focal hepatic lesion. No biliary duct dilatation. Common bile duct is normal. Pancreas: Pancreas is normal. No ductal dilatation. No pancreatic inflammation. Spleen: Normal spleen Adrenals/urinary  tract: Adrenal glands, kidneys, ureters and bladder normal. Stomach/Bowel: Stomach, small bowel, appendix, and cecum are normal. The colon and rectosigmoid colon are normal. Vascular/Lymphatic: Abdominal aorta is normal caliber. No periportal or retroperitoneal adenopathy. No pelvic adenopathy. Reproductive: Post hysterectomy.  Adnexa unremarkable Other: Small gas collection in the RIGHT abdominal wall above the umbilicus measuring 1.4 by 1.5 cm on image 43/4. Musculoskeletal: No aggressive osseous lesion. IMPRESSION: 1. Peripheral ground-glass opacities in the lower lobes consistent with COVID viral pneumonia. 2. No clear acute findings in the abdomen pelvis. 3. Small gas collection in the subcutaneous tissue of the RIGHT abdominal wall. Recommend clinical correlation with subcutaneous injection. Electronically Signed   By: SSuzy BouchardM.D.   On: 03/03/2020 14:01        Scheduled Meds: . DULoxetine  60 mg Oral Daily  . enoxaparin (LOVENOX) injection  40 mg Subcutaneous Q24H  . insulin aspart  0-9 Units Subcutaneous Q4H  . mouth rinse  15 mL Mouth Rinse BID  . methylPREDNISolone (SOLU-MEDROL) injection  60 mg Intravenous Q24H  . sodium chloride flush  3 mL Intravenous Q12H   Continuous Infusions: . sodium chloride    . sodium chloride 75 mL/hr at 03/05/20 0624  . remdesivir 100 mg in NS 100 mL Stopped (03/04/20 1046)     LOS: 2 days    Time spent:40 min   WOODS, CGeraldo Docker MD [facility] Pager [phone] If 7PM-7AM, please contact night-coverage [email or web address] Password [email or web address] 03/05/2020, 7:03 AM

## 2020-03-05 NOTE — Progress Notes (Signed)
Inpatient Diabetes Program Recommendations  AACE/ADA: New Consensus Statement on Inpatient Glycemic Control (2015)  Target Ranges:  Prepandial:   less than 140 mg/dL      Peak postprandial:   less than 180 mg/dL (1-2 hours)      Critically ill patients:  140 - 180 mg/dL   Lab Results  Component Value Date   GLUCAP 215 (H) 03/05/2020   HGBA1C 9.7 (H) 03/02/2020    Review of Glycemic Control Results for Amanda Frederick, Amanda Frederick (MRN [id number]) as of 03/05/2020 13:00  Ref. Range 03/04/2020 11:39 03/04/2020 16:19 03/04/2020 17:16 03/04/2020 19:53 03/04/2020 23:56 03/05/2020 04:24 03/05/2020 08:00 03/05/2020 11:57  Glucose-Capillary Latest Ref Range: 70 - 99 mg/dL 177 (H) 116 (H) 579 (H) 351 (H) 250 (H) 238 (H) 200 (H) 215 (H)   Diabetes history: DM2 Outpatient Diabetes medications: Novolin RN 20 units tid + Novolin N 60 units bid Current orders for Inpatient glycemic control: Novolog sensitive correction q 4 hrs.  Inpatient Diabetes Program Recommendations:   -Levemir 5-7 units bid (0.2 units/kg x 77.1 kg = 15 units)  Will follow during hospitalization. Thank you, Amanda Frederick. Amanda Stakes, RN, MSN, CDE  Diabetes Coordinator Inpatient Glycemic Control Team Team Pager [phone] (8am-5pm) 03/05/2020 1:03 PM

## 2020-03-05 NOTE — Progress Notes (Signed)
   03/05/20 2023  Assess: MEWS Score  Temp 98.3 F (36.8 C)  BP 130/76  Pulse Rate 94  ECG Heart Rate 92  Resp 15  Level of Consciousness Alert  SpO2 92 %  O2 Device Room Air  Patient Activity (if Appropriate) In bed  Assess: MEWS Score  MEWS Temp 0  MEWS Systolic 0  MEWS Pulse 0  MEWS RR 0  MEWS LOC 0  MEWS Score 0  MEWS Score Color Green  Assess: if the MEWS score is Yellow or Red  Were vital signs taken at a resting state? Yes  Focused Assessment No change from prior assessment  Early Detection of Sepsis Score *See Row Information* Low  MEWS guidelines implemented *See Row Information* No, other (Comment) (no acute changes)  Treat  Pain Scale 0-10  Pain Score 4  Pain Type Acute pain  Pain Location Head  Pain Descriptors / Indicators Headache  Pain Frequency Occasional  Pain Onset Gradual  Patients Stated Pain Goal 0  Pain Intervention(s) Medication (See eMAR)  Multiple Pain Sites Yes  2nd Pain Site  Pain Score 4  Pain Type Acute pain  Pain Location Back  Pain Orientation Lower  Pain Descriptors / Indicators Aching;Discomfort  Pain Frequency Occasional  Pain Onset Gradual  Patient's Stated Pain Goal 0  Pain Intervention(s) Medication (See eMAR)  Document  Progress note created (see row info) Yes

## 2020-03-06 LAB — COMPREHENSIVE METABOLIC PANEL
ALT: 12 U/L (ref 0–44)
AST: 13 U/L — ABNORMAL LOW (ref 15–41)
Albumin: 2.2 g/dL — ABNORMAL LOW (ref 3.5–5.0)
Alkaline Phosphatase: 53 U/L (ref 38–126)
Anion gap: 10 (ref 5–15)
BUN: 11 mg/dL (ref 6–20)
CO2: 24 mmol/L (ref 22–32)
Calcium: 8 mg/dL — ABNORMAL LOW (ref 8.9–10.3)
Chloride: 107 mmol/L (ref 98–111)
Creatinine, Ser: 0.46 mg/dL (ref 0.44–1.00)
GFR calc Af Amer: >60 mL/min (ref >60)
GFR calc non Af Amer: >60 mL/min (ref >60)
Glucose, Bld: 152 mg/dL — ABNORMAL HIGH (ref 70–99)
Potassium: 3.4 mmol/L — ABNORMAL LOW (ref 3.5–5.1)
Sodium: 141 mmol/L (ref 135–145)
Total Bilirubin: 0.5 mg/dL (ref 0.3–1.2)
Total Protein: 5 g/dL — ABNORMAL LOW (ref 6.5–8.1)

## 2020-03-06 LAB — CBC WITH DIFFERENTIAL/PLATELET
Abs Immature Granulocytes: 0.08 10*3/uL — ABNORMAL HIGH (ref 0.00–0.07)
Basophils Absolute: 0 10*3/uL (ref 0.0–0.1)
Basophils Relative: 0 %
Eosinophils Absolute: 0 10*3/uL (ref 0.0–0.5)
Eosinophils Relative: 0 %
HCT: 37.3 % (ref 36.0–46.0)
Hemoglobin: 12.8 g/dL (ref 12.0–15.0)
Immature Granulocytes: 1 %
Lymphocytes Relative: 10 %
Lymphs Abs: 0.9 10*3/uL (ref 0.7–4.0)
MCH: 28.4 pg (ref 26.0–34.0)
MCHC: 34.3 g/dL (ref 30.0–36.0)
MCV: 82.7 fL (ref 80.0–100.0)
Monocytes Absolute: 0.6 10*3/uL (ref 0.1–1.0)
Monocytes Relative: 8 %
Neutro Abs: 6.6 10*3/uL (ref 1.7–7.7)
Neutrophils Relative %: 81 %
Platelets: 322 10*3/uL (ref 150–400)
RBC: 4.51 MIL/uL (ref 3.87–5.11)
RDW: 11.6 % (ref 11.5–15.5)
WBC: 8.2 10*3/uL (ref 4.0–10.5)
nRBC: 0 % (ref 0.0–0.2)

## 2020-03-06 LAB — LACTATE DEHYDROGENASE: LDH: 232 U/L — ABNORMAL HIGH (ref 98–192)

## 2020-03-06 LAB — GLUCOSE, CAPILLARY
Glucose-Capillary: 133 mg/dL — ABNORMAL HIGH (ref 70–99)
Glucose-Capillary: 171 mg/dL — ABNORMAL HIGH (ref 70–99)
Glucose-Capillary: 182 mg/dL — ABNORMAL HIGH (ref 70–99)
Glucose-Capillary: 244 mg/dL — ABNORMAL HIGH (ref 70–99)
Glucose-Capillary: 352 mg/dL — ABNORMAL HIGH (ref 70–99)
Glucose-Capillary: 383 mg/dL — ABNORMAL HIGH (ref 70–99)

## 2020-03-06 LAB — C-REACTIVE PROTEIN: CRP: 1.8 mg/dL — ABNORMAL HIGH (ref <1.0)

## 2020-03-06 LAB — FERRITIN: Ferritin: 365 ng/mL — ABNORMAL HIGH (ref 11–307)

## 2020-03-06 LAB — D-DIMER, QUANTITATIVE: D-Dimer, Quant: 0.44 ug/mL-FEU (ref 0.00–0.50)

## 2020-03-06 LAB — PHOSPHORUS: Phosphorus: 2 mg/dL — ABNORMAL LOW (ref 2.5–4.6)

## 2020-03-06 LAB — MAGNESIUM: Magnesium: 1.9 mg/dL (ref 1.7–2.4)

## 2020-03-06 MED ORDER — POTASSIUM PHOSPHATES 15 MMOLE/5ML IV SOLN
30.0000 mmol | Freq: Once | INTRAVENOUS | Status: AC
  Administered 2020-03-06: 30 mmol via INTRAVENOUS
  Filled 2020-03-06: qty 10

## 2020-03-06 NOTE — Progress Notes (Signed)
PROGRESS NOTE    Amanda Frederick  MCE:[id number] DOB: 07-21-1971 DOA: 03/02/2020 PCP: Nicoletta Dress, MD     Brief Narrative:  49 y.o. WF PMHx Depression, Anxiety, DM type II uncontrolled with complication, DM neuropathy, HTN, HLD,.  Migraines,  Pt began having symptoms of N/V/D, fevers, chills, cough.  Onset 02/19/20.  Symptoms progressively worsened.  Pt diagnosed with COVID-19 at [facility] a couple of days ago.  Symptoms have continued to worsen now with SOB.  Pt presents to ED due to worsening symptoms.    ED Course: COVID positive.  No O2 requirement at this time, satting 95% on RA currently at rest.  CXR: Slight interval worsening in the airspace opacities which could be due to multifocal pneumonia.  WBC nl, procalcitonin neg, CRP 4.9, D.Dimer 0.75.  Hospitalist asked to admit due to worsening COVID symptoms.    Subjective: 8/10 afebrile overnight, A/O x4, states tolerated clear liquid diet.  Negative nausea, negative vomiting.  Negative diarrhea.   Assessment & Plan: Covid vaccination; no vaccination   Principal Problem:   COVID-19 virus infection Active Problems:   Essential hypertension   Diabetes ([place])   High cholesterol   Depression   Diabetes mellitus type 2, uncontrolled, with complications ([place])   Diabetic neuropathy ([place])   Anxiety   HLD (hyperlipidemia)   Acute respiratory failure with hypoxia (HCC)  SIRS -Upon admission met criteria temp> 30 C, HR> 90,RR> 20 -Lactic acid= 1.5; and procalcitonin= 0.10, therefore does not meet criteria for sepsis -We will monitor closely   Acute respiratory failure with hypoxia/Covid 19 infection/ COVID-19 Labs  Recent Labs    03/04/20 0747 03/05/20 0248 03/06/20 0500  DDIMER 0.56* 0.58* 0.44  FERRITIN 351* 418* 365*  LDH 298* 273* 232*  CRP 5.4* 6.8* 1.8*    Lab Results  Component Value Date   SARSCOV2NAA POSITIVE (A) 03/02/2020  -Remdesivir -8/7 Solu-Medrol 60 mg daily -8/7 also obtain CT abdomen  pelvis ordered Covid gut. -Findings on CT consistent with Covid pneumonitis, no acute GI findings.  Severe abdominal pain/Covid Gastroenteritis  -Morphine 1 mg q 3 hours PRN -Hydration normal saline 50m/hr -8/10 advance diet full liquid -Advance diet on 8/11 if patient tolerates  Essential HTN -Controlled without medication  HLD  Depression/Anxiety -Cymbalta 60 mg daily  DM type II uncontrolled with complication/DM neuropathy -8/6 hemoglobin A1c= 9.7 -Sensitive SSI  Hypokalemia -Potassium goal> 4 -K-Phos 30 mmol  Hypophosphatemia -See hypokalemia   DVT prophylaxis: Lovenox Code Status: Full Family Communication:  Status is: Inpatient    Dispo: The patient is from: Home              Anticipated d/c is to: Home              Anticipated d/c date is: 8/12              Patient currently       Consultants:    Procedures/Significant Events:  8/7 CT abdomen and pelvis W contrast;-peripheral ground-glass opacities in the lower lobes consistent with COVID viral pneumonia. -No clear acute findings in the abdomen pelvis. -Small gas collection in the subcutaneous tissue of the RIGHT abdominal wall. Recommend clinical correlation with subcutaneous injection. 8/7 KUB; negative   I have personally reviewed and interpreted all radiology studies and my findings are as above.   VENTILATOR SETTINGS: Room air 8/10  SPO2; 93%   Cultures 8/6 SARS coronavirus positive 8/6 HIV screen negative 8/6 blood x2  Antimicrobials: Anti-infectives (From admission, onward)  Start     Ordered Stop   03/03/20 1000  remdesivir 100 mg in sodium chloride 0.9 % 100 mL IVPB     Discontinue    "Followed by" Linked Group Details   03/02/20 2101 03/07/20 0959   03/02/20 2200  remdesivir 200 mg in sodium chloride 0.9% 250 mL IVPB       "Followed by" Linked Group Details   03/02/20 2101 03/02/20 2302       Devices    LINES / TUBES:      Continuous Infusions: . sodium  chloride    . sodium chloride 75 mL/hr at 03/06/20 0636     Objective: Vitals:   03/06/20 0027 03/06/20 0348 03/06/20 0815 03/06/20 1335  BP: 128/77 132/82 135/87 132/82  Pulse: 82 85 85 89  Resp: 16 12 16 17   Temp: (!) 97.5 F (36.4 C) 98.1 F (36.7 C) 98.1 F (36.7 C) 98.4 F (36.9 C)  TempSrc: Oral Oral Oral Oral  SpO2: 93% 94% 92% 92%  Weight:      Height:        Intake/Output Summary (Last 24 hours) at 03/06/2020 1729 Last data filed at 03/06/2020 6433 Gross per 24 hour  Intake 2176.4 ml  Output --  Net 2176.4 ml   Filed Weights   03/03/20 2015  Weight: 77.1 kg   Physical Exam:  General: A/O x4 No acute respiratory distress Eyes: negative scleral hemorrhage, negative anisocoria, negative icterus ENT: Negative Runny nose, negative gingival bleeding, Neck:  Negative scars, masses, torticollis, lymphadenopathy, JVD Lungs: Clear to auscultation bilaterally without wheezes or crackles Cardiovascular: Regular rate and rhythm without murmur gallop or rub normal S1 and S2 Abdomen: negative abdominal pain, nondistended, positive soft, bowel sounds, no rebound, no ascites, no appreciable mass Extremities: No significant cyanosis, clubbing, or edema bilateral lower extremities Skin: Negative rashes, lesions, ulcers Psychiatric:  Negative depression, negative anxiety, negative fatigue, negative mania  Central nervous system:  Cranial nerves II through XII intact, tongue/uvula midline, all extremities muscle strength 5/5, sensation intact throughout,  negative dysarthria, negative expressive aphasia, negative receptive aphasia.  .     Data Reviewed: Care during the described time interval was provided by me .  I have reviewed this patient's available data, including medical history, events of note, physical examination, and all test results as part of my evaluation.  CBC: Recent Labs  Lab 03/02/20 1855 03/03/20 0430 03/04/20 0747 03/05/20 0248 03/06/20 0500  WBC  10.4 6.8 8.4 6.4 8.2  NEUTROABS 8.4* 4.8 6.7 5.8 6.6  HGB 14.5 13.7 14.1 13.7 12.8  HCT 44.1 42.0 42.7 40.4 37.3  MCV 83.7 85.9 83.7 81.9 82.7  PLT 245 199 249 271 295   Basic Metabolic Panel: Recent Labs  Lab 03/02/20 1855 03/03/20 0430 03/04/20 0747 03/05/20 0248 03/06/20 0500  NA 140 140 141 138 141  K 3.0* 3.5 3.2* 3.4* 3.4*  CL 104 105 106 104 107  CO2 23 27 24 23 24   GLUCOSE 153* 143* 115* 275* 152*  BUN 12 16 12 13 11   CREATININE 0.70 0.82 0.58 0.52 0.46  CALCIUM 8.5* 8.1* 7.7* 7.9* 8.0*  MG  --   --  1.6* 1.9 1.9  PHOS  --   --  2.7 2.6 2.0*   GFR: Estimated Creatinine Clearance: 89.2 mL/min (by C-G formula based on SCr of 0.46 mg/dL). Liver Function Tests: Recent Labs  Lab 03/02/20 1855 03/03/20 0430 03/04/20 0747 03/05/20 0248 03/06/20 0500  AST 23 22 19  13* 13*  ALT 18 15 14 13 12   ALKPHOS 65 60 56 59 53  BILITOT 0.9 0.6 0.5 0.4 0.5  PROT 6.7 6.0* 5.6* 5.4* 5.0*  ALBUMIN 3.1* 2.7* 2.5* 2.4* 2.2*   No results for input(s): LIPASE, AMYLASE in the last 168 hours. No results for input(s): AMMONIA in the last 168 hours. Coagulation Profile: No results for input(s): INR, PROTIME in the last 168 hours. Cardiac Enzymes: No results for input(s): CKTOTAL, CKMB, CKMBINDEX, TROPONINI in the last 168 hours. BNP (last 3 results) No results for input(s): PROBNP in the last 8760 hours. HbA1C: No results for input(s): HGBA1C in the last 72 hours. CBG: Recent Labs  Lab 03/06/20 0025 03/06/20 0346 03/06/20 0800 03/06/20 1122 03/06/20 1550  GLUCAP 244* 182* 133* 171* 383*   Lipid Profile: No results for input(s): CHOL, HDL, LDLCALC, TRIG, CHOLHDL, LDLDIRECT in the last 72 hours. Thyroid Function Tests: No results for input(s): TSH, T4TOTAL, FREET4, T3FREE, THYROIDAB in the last 72 hours. Anemia Panel: Recent Labs    03/05/20 0248 03/06/20 0500  FERRITIN 418* 365*   Sepsis Labs: Recent Labs  Lab 03/02/20 1855 03/02/20 1933  PROCALCITON 0.10  --    LATICACIDVEN  --  1.5    Recent Results (from the past 240 hour(s))  Blood Culture (routine x 2)     Status: None (Preliminary result)   Collection Time: 03/02/20  7:32 PM   Specimen: BLOOD  Result Value Ref Range Status   Specimen Description BLOOD RIGHT ANTECUBITAL  Final   Special Requests   Final    BOTTLES DRAWN AEROBIC AND ANAEROBIC Blood Culture adequate volume   Culture   Final    NO GROWTH 4 DAYS Performed at [facility], [place] [address] [place], [place] [address]    Report Status PENDING  Incomplete  Blood Culture (routine x 2)     Status: None (Preliminary result)   Collection Time: 03/02/20  7:35 PM   Specimen: BLOOD  Result Value Ref Range Status   Specimen Description BLOOD LEFT ANTECUBITAL  Final   Special Requests   Final    BOTTLES DRAWN AEROBIC AND ANAEROBIC Blood Culture adequate volume   Culture   Final    NO GROWTH 4 DAYS Performed at [facility], [place] [address], [place], [place] [address]    Report Status PENDING  Incomplete  SARS Coronavirus 2 by RT PCR (hospital order, performed in [facility] hospital lab) Nasopharyngeal Nasopharyngeal Swab     Status: Abnormal   Collection Time: 03/02/20  7:35 PM   Specimen: Nasopharyngeal Swab  Result Value Ref Range Status   SARS Coronavirus 2 POSITIVE (A) NEGATIVE Final    Comment: RESULT CALLED TO, READ BACK BY AND VERIFIED WITH: RESULT CALLED TO, READ BACK BY AND VERIFIED WITH: J GLOOSTER RN 03/02/20 2048 JDW (NOTE) SARS-CoV-2 target nucleic acids are DETECTED  SARS-CoV-2 RNA is generally detectable in upper respiratory specimens  during the acute phase of infection.  Positive results are indicative  of the presence of the identified virus, but do not rule out bacterial infection or co-infection with other pathogens not detected by the test.  Clinical correlation with patient history and  other diagnostic information is necessary to determine patient infection status.  The expected result is  negative.  Fact Sheet for Patients:   StrictlyIdeas.no   Fact Sheet for Healthcare Providers:   BankingDealers.co.za    This test is not yet approved or cleared by the [place] FDA and  has been authorized for detection and/or diagnosis of SARS-CoV-2 by FDA under an Emergency Use Authorization (EUA).  Th is EUA will remain in effect (meaning this test can be used) for the duration of  the COVID-19 declaration under Section 564(b)(1) of the Act, 21 U.S.C. section 360-bbb-3(b)(1), unless the authorization is terminated or revoked sooner.  Performed at [facility], [place] [address], [place], [place] [address]          Radiology Studies: No results found.      Scheduled Meds: . DULoxetine  60 mg Oral Daily  . enoxaparin (LOVENOX) injection  40 mg Subcutaneous Q24H  . insulin aspart  0-9 Units Subcutaneous Q4H  . mouth rinse  15 mL Mouth Rinse BID  . methylPREDNISolone (SOLU-MEDROL) injection  60 mg Intravenous Q24H  . sodium chloride flush  3 mL Intravenous Q12H   Continuous Infusions: . sodium chloride    . sodium chloride 75 mL/hr at 03/06/20 0636     LOS: 3 days    Time spent:40 min   Leona Pressly, Geraldo Docker, MD [facility] Pager [phone]  If 7PM-7AM, please contact night-coverage [email or web address] Password [facility] 03/06/2020, 5:29 PM

## 2020-03-07 ENCOUNTER — Encounter (HOSPITAL_COMMUNITY): Payer: Self-pay | Admitting: Internal Medicine

## 2020-03-07 LAB — COMPREHENSIVE METABOLIC PANEL
ALT: 13 U/L (ref 0–44)
AST: 20 U/L (ref 15–41)
Albumin: 2.4 g/dL — ABNORMAL LOW (ref 3.5–5.0)
Alkaline Phosphatase: 76 U/L (ref 38–126)
Anion gap: 10 (ref 5–15)
BUN: 8 mg/dL (ref 6–20)
CO2: 24 mmol/L (ref 22–32)
Calcium: 8.2 mg/dL — ABNORMAL LOW (ref 8.9–10.3)
Chloride: 103 mmol/L (ref 98–111)
Creatinine, Ser: 0.53 mg/dL (ref 0.44–1.00)
GFR calc Af Amer: >60 mL/min (ref >60)
GFR calc non Af Amer: >60 mL/min (ref >60)
Glucose, Bld: 293 mg/dL — ABNORMAL HIGH (ref 70–99)
Potassium: 4.2 mmol/L (ref 3.5–5.1)
Sodium: 137 mmol/L (ref 135–145)
Total Bilirubin: 0.5 mg/dL (ref 0.3–1.2)
Total Protein: 5.2 g/dL — ABNORMAL LOW (ref 6.5–8.1)

## 2020-03-07 LAB — GLUCOSE, CAPILLARY
Glucose-Capillary: 189 mg/dL — ABNORMAL HIGH (ref 70–99)
Glucose-Capillary: 261 mg/dL — ABNORMAL HIGH (ref 70–99)
Glucose-Capillary: 278 mg/dL — ABNORMAL HIGH (ref 70–99)
Glucose-Capillary: 297 mg/dL — ABNORMAL HIGH (ref 70–99)

## 2020-03-07 LAB — CBC WITH DIFFERENTIAL/PLATELET
Abs Immature Granulocytes: 0.19 10*3/uL — ABNORMAL HIGH (ref 0.00–0.07)
Basophils Absolute: 0 10*3/uL (ref 0.0–0.1)
Basophils Relative: 0 %
Eosinophils Absolute: 0 10*3/uL (ref 0.0–0.5)
Eosinophils Relative: 0 %
HCT: 37.1 % (ref 36.0–46.0)
Hemoglobin: 12.6 g/dL (ref 12.0–15.0)
Immature Granulocytes: 2 %
Lymphocytes Relative: 6 %
Lymphs Abs: 0.6 10*3/uL — ABNORMAL LOW (ref 0.7–4.0)
MCH: 28.2 pg (ref 26.0–34.0)
MCHC: 34 g/dL (ref 30.0–36.0)
MCV: 83 fL (ref 80.0–100.0)
Monocytes Absolute: 0.7 10*3/uL (ref 0.1–1.0)
Monocytes Relative: 7 %
Neutro Abs: 8 10*3/uL — ABNORMAL HIGH (ref 1.7–7.7)
Neutrophils Relative %: 85 %
Platelets: 373 10*3/uL (ref 150–400)
RBC: 4.47 MIL/uL (ref 3.87–5.11)
RDW: 11.7 % (ref 11.5–15.5)
WBC: 9.5 10*3/uL (ref 4.0–10.5)
nRBC: 0 % (ref 0.0–0.2)

## 2020-03-07 LAB — FERRITIN: Ferritin: 355 ng/mL — ABNORMAL HIGH (ref 11–307)

## 2020-03-07 LAB — MAGNESIUM: Magnesium: 1.9 mg/dL (ref 1.7–2.4)

## 2020-03-07 LAB — CULTURE, BLOOD (ROUTINE X 2)
Culture: NO GROWTH
Culture: NO GROWTH
Special Requests: ADEQUATE
Special Requests: ADEQUATE

## 2020-03-07 LAB — LACTATE DEHYDROGENASE: LDH: 330 U/L — ABNORMAL HIGH (ref 98–192)

## 2020-03-07 LAB — C-REACTIVE PROTEIN: CRP: 2.2 mg/dL — ABNORMAL HIGH (ref <1.0)

## 2020-03-07 LAB — D-DIMER, QUANTITATIVE: D-Dimer, Quant: 0.36 ug/mL-FEU (ref 0.00–0.50)

## 2020-03-07 LAB — PHOSPHORUS: Phosphorus: 3.5 mg/dL (ref 2.5–4.6)

## 2020-03-07 MED ORDER — DEXAMETHASONE 6 MG PO TABS: 6.0000 mg | ORAL_TABLET | Freq: Every day | ORAL | 0 refills | Status: DC

## 2020-03-07 MED ORDER — PANTOPRAZOLE SODIUM 40 MG PO TBEC: 40.0000 mg | DELAYED_RELEASE_TABLET | Freq: Every day | ORAL | 0 refills | Status: DC

## 2020-03-07 NOTE — Progress Notes (Signed)
Inpatient Diabetes Program Recommendations  AACE/ADA: New Consensus Statement on Inpatient Glycemic Control (2015)  Target Ranges:  Prepandial:   less than 140 mg/dL      Peak postprandial:   less than 180 mg/dL (1-2 hours)      Critically ill patients:  140 - 180 mg/dL   Lab Results  Component Value Date   GLUCAP 278 (H) 03/07/2020   HGBA1C 9.7 (H) 03/02/2020    Review of Glycemic Control Results for DANAHI, REDDISH (MRN [id number]) as of 03/07/2020 14:03  Ref. Range 03/06/2020 15:50 03/06/2020 21:04 03/07/2020 00:12 03/07/2020 04:09 03/07/2020 07:42 03/07/2020 11:38  Glucose-Capillary Latest Ref Range: 70 - 99 mg/dL 163 (H) 846 (H) 659 (H) 261 (H) 189 (H) 278 (H)   Diabetes history: DM2 Outpatient Diabetes medications: Novolin N Relion 60 units bid Current orders for Inpatient glycemic control: Novolog sensitive correction q 4 hrs.  Inpatient Diabetes Program Recommendations:   -Levemir 7 units bid (0.2 units/kg x 77.1 kg = 15 units) -Novolog 4 units tid meal coverage if eats 50% meals -Change Novolog sensitive correction to tid + hs 0-5 units  Thank you, Darel Hong E. Trig Mcbryar, RN, MSN, CDE  Diabetes Coordinator Inpatient Glycemic Control Team Team Pager [phone] (8am-5pm) 03/07/2020 2:01 PM

## 2020-03-07 NOTE — Discharge Summary (Signed)
Amanda Frederick, is a 49 y.o. female  DOB [birth year]  MRN [id number].  Admission date:  03/02/2020  Admitting Physician  Drema Dallas, MD  Discharge Date:  03/07/2020   Primary MD  Paulina Fusi, MD  Recommendations for primary care physician for things to follow:  -Please check CBC, CMP during next visit.   Admission Diagnosis  COVID-19 virus infection [U07.1]   Discharge Diagnosis  COVID-19 virus infection [U07.1]    Principal Problem:   COVID-19 virus infection Active Problems:   Essential hypertension   Diabetes (HCC)   High cholesterol   Depression   Diabetes mellitus type 2, uncontrolled, with complications (HCC)   Diabetic neuropathy (HCC)   Anxiety   HLD (hyperlipidemia)   Acute respiratory failure with hypoxia (HCC)      Past Medical History:  Diagnosis Date  . AC (acromioclavicular) joint bone spurs   . Anxiety   . Depression   . Diabetes (HCC)   . High blood pressure   . High cholesterol   . Migraines   . Neuropathy    eyes , hands and feet     Past Surgical History:  Procedure Laterality Date  . NASAL SINUS SURGERY    . TUBAL LIGATION    . VAGINAL HYSTERECTOMY         History of present illness and  Hospital Course:     Kindly see H&P for history of present illness and admission details, please review complete Labs, Consult reports and Test reports for all details in brief  HPI  from the history and physical done on the day of admission 03/02/2020   HPI: Amanda Frederick is a 49 y.o. female with medical history significant of IDDM.  Pt did not get COVID vaccine.  Pt began having symptoms of N/V/D, fevers, chills, cough.  Onset 02/19/20.  Symptoms progressively worsened.  Pt diagnosed with COVID-19 at [facility] a couple of days ago.  Symptoms have continued to worsen now with SOB.  Pt presents to ED due to worsening symptoms.    ED Course: COVID positive.  No  O2 requirement at this time, satting 95% on RA currently at rest.  CXR: Slight interval worsening in the airspace opacities which could be due to multifocal pneumonia.  WBC nl, procalcitonin neg, CRP 4.9, D.Dimer 0.75.  Hospitalist asked to admit due to worsening COVID symptoms.    Hospital Course   COVID-19 pneumonia/gastroenteritis -Patient main presentation was due to GI symptoms, will she had evidence of multifocal opacity on chest x-ray, she had no hypoxia, main symptoms where GI symptoms, she was treated with IV fluids, kept initially on full liquid diet, this has been advanced to regular diet today which she has tolerated very well, she finished total 5 days of remdesivir, she was on IV steroids during hospital stay, she will be discharged on oral Decadron to finish another 5 days for total of 10 days therapy. -Patient was instructed to come back to ED if she develops any dyspnea /difficulty breathing.  Hypertension -  Continue home medications  Depression/anxiety -Continue with home dose Cymbalta  Hypophosphatemia/hypokalemia -Repleted  Type 2 diabetes mellitus, uncontrolled with A1c of 9.7 -Resume insulin pump on discharge  Discharge Condition:  stable  Follow UP   Follow-up Information    Paulina Fusi, MD Follow up in 3 week(s).   Specialty: Internal Medicine Contact information: [address] [place] [address] [phone]                 Discharge Instructions  and  Discharge Medications     Discharge Instructions    Discharge instructions   Complete by: As directed    Follow with Primary MD Paulina Fusi, MD   Get CBC, CMP, checked  by Primary MD next visit.    Activity: As tolerated with Full fall precautions use walker/cane & assistance as needed   Disposition Home    Diet: Heart Healthy /Carb modified , with feeding assistance and aspiration precautions.  On your next visit with your primary care  physician please Get Medicines reviewed and adjusted.   Please request your Prim.MD to go over all Hospital Tests and Procedure/Radiological results at the follow up, please get all Hospital records sent to your Prim MD by signing hospital release before you go home.   If you experience worsening of your admission symptoms, develop shortness of breath, life threatening emergency, suicidal or homicidal thoughts you must seek medical attention immediately by calling 911 or calling your MD immediately  if symptoms less severe.  You Must read complete instructions/literature along with all the possible adverse reactions/side effects for all the Medicines you take and that have been prescribed to you. Take any new Medicines after you have completely understood and accpet all the possible adverse reactions/side effects.   Do not drive, operating heavy machinery, perform activities at heights, swimming or participation in water activities or provide baby sitting services if your were admitted for syncope or siezures until you have seen by Primary MD or a Neurologist and advised to do so again.  Do not drive when taking Pain medications.    Do not take more than prescribed Pain, Sleep and Anxiety Medications  Special Instructions: If you have smoked or chewed Tobacco  in the last 2 yrs please stop smoking, stop any regular Alcohol  and or any Recreational drug use.  Wear Seat belts while driving.   Please note  You were cared for by a hospitalist during your hospital stay. If you have any questions about your discharge medications or the care you received while you were in the hospital after you are discharged, you can call the unit and asked to speak with the hospitalist on call if the hospitalist that took care of you is not available. Once you are discharged, your primary care physician will handle any further medical issues. Please note that NO REFILLS for any discharge medications will be  authorized once you are discharged, as it is imperative that you return to your primary care physician (or establish a relationship with a primary care physician if you do not have one) for your aftercare needs so that they can reassess your need for medications and monitor your lab values.   Increase activity slowly   Complete by: As directed      Allergies as of 03/07/2020      Reactions   Other Hives   Antibiotic      Medication List    STOP taking these medications  dicyclomine 10 MG capsule Commonly known as: BENTYL     TAKE these medications   albuterol 108 (90 Base) MCG/ACT inhaler Commonly known as: VENTOLIN HFA Inhale 1 puff into the lungs every 6 (six) hours as needed for wheezing or shortness of breath.   cholecalciferol 1000 units tablet Commonly known as: VITAMIN D Take 4,000 Units by mouth daily.   dexamethasone 6 MG tablet Commonly known as: Decadron Take 1 tablet (6 mg total) by mouth daily. Start taking on: March 08, 2020   DULoxetine 60 MG capsule Commonly known as: Cymbalta Take 1 capsule (60 mg total) by mouth daily.   fish oil-omega-3 fatty acids 1000 MG capsule Take 2 g by mouth daily.   glucose blood test strip Commonly known as: Banker Next Test Test 8-10 times per day 8-10 lancets/day   insulin aspart 100 UNIT/ML injection Commonly known as: NovoLOG For use in pump, total of 120 units per day   lisinopril 20 MG tablet Commonly known as: ZESTRIL Take 20 mg by mouth daily. What changed: Another medication with the same name was removed. Continue taking this medication, and follow the directions you see here.   NovoLIN N ReliOn 100 UNIT/ML injection Generic drug: insulin NPH Human Inject 60 Units into the skin in the morning and at bedtime.   pantoprazole 40 MG tablet Commonly known as: Protonix Take 1 tablet (40 mg total) by mouth daily for 30 doses.   Paradigm Quick-set 32" 56mm Misc 1 Device by Does not apply route every 3  (three) days.   pravastatin 20 MG tablet Commonly known as: PRAVACHOL Take 20 mg by mouth daily.         Diet and Activity recommendation: See Discharge Instructions above   Consults obtained - none   Major procedures and Radiology Reports - PLEASE review detailed and final reports for all details, in brief -     DG Abd 1 View  Result Date: 03/03/2020 CLINICAL DATA:  Nausea, vomiting, fevers, chills, cough. New diagnosis of Covid-19 [facility] a couple days ago. Worsening shortness of breath. EXAM: ABDOMEN - 1 VIEW COMPARISON:  None. FINDINGS: The bowel gas pattern is normal. No radio-opaque calculi or other significant radiographic abnormality are seen. IMPRESSION: Negative. Electronically Signed   By: Norva Pavlov M.D.   On: 03/03/2020 13:15   CT ABDOMEN PELVIS W CONTRAST  Result Date: 03/03/2020 CLINICAL DATA:  Intra-abdominal peritonitis.  Positive COVID test. EXAM: CT ABDOMEN AND PELVIS WITH CONTRAST TECHNIQUE: Multidetector CT imaging of the abdomen and pelvis was performed using the standard protocol following bolus administration of intravenous contrast. CONTRAST:  OMNIPAQUE IOHEXOL 300 MG/ML  SOLN COMPARISON:  Chest radiograph 03/02/2020 FINDINGS: Lower chest: Peripheral ground-glass opacities in the LEFT RIGHT lower lobe consistent with COVID viral pneumonia. Hepatobiliary: No focal hepatic lesion. No biliary duct dilatation. Common bile duct is normal. Pancreas: Pancreas is normal. No ductal dilatation. No pancreatic inflammation. Spleen: Normal spleen Adrenals/urinary tract: Adrenal glands, kidneys, ureters and bladder normal. Stomach/Bowel: Stomach, small bowel, appendix, and cecum are normal. The colon and rectosigmoid colon are normal. Vascular/Lymphatic: Abdominal aorta is normal caliber. No periportal or retroperitoneal adenopathy. No pelvic adenopathy. Reproductive: Post hysterectomy.  Adnexa unremarkable Other: Small gas collection in the RIGHT  abdominal wall above the umbilicus measuring 1.4 by 1.5 cm on image 43/4. Musculoskeletal: No aggressive osseous lesion. IMPRESSION: 1. Peripheral ground-glass opacities in the lower lobes consistent with COVID viral pneumonia. 2. No clear acute findings in the abdomen pelvis.  3. Small gas collection in the subcutaneous tissue of the RIGHT abdominal wall. Recommend clinical correlation with subcutaneous injection. Electronically Signed   By: Genevive Bi M.D.   On: 03/03/2020 14:01   DG Chest Port 1 View  Result Date: 03/02/2020 CLINICAL DATA:  Hypoxemia, COVID positive EXAM: PORTABLE CHEST 1 VIEW COMPARISON:  February 29, 2020. FINDINGS: The heart size and mediastinal contours are within normal limits. Again noted is a hazy patchy airspace opacity at the periphery of the right lung base with slight interval worsening. There is also a hazy airspace opacity at the periphery of the left lung base. IMPRESSION: Slight interval worsening in the airspace opacities which could be due to multifocal pneumonia. Electronically Signed   By: Jonna Clark M.D.   On: 03/02/2020 20:06    Micro Results    Recent Results (from the past 240 hour(s))  Blood Culture (routine x 2)     Status: None   Collection Time: 03/02/20  7:32 PM   Specimen: BLOOD  Result Value Ref Range Status   Specimen Description BLOOD RIGHT ANTECUBITAL  Final   Special Requests   Final    BOTTLES DRAWN AEROBIC AND ANAEROBIC Blood Culture adequate volume   Culture   Final    NO GROWTH 5 DAYS Performed at [facility], [address], [place], [place] [address]    Report Status 03/07/2020 FINAL  Final  Blood Culture (routine x 2)     Status: None   Collection Time: 03/02/20  7:35 PM   Specimen: BLOOD  Result Value Ref Range Status   Specimen Description BLOOD LEFT ANTECUBITAL  Final   Special Requests   Final    BOTTLES DRAWN AEROBIC AND ANAEROBIC Blood Culture adequate volume   Culture   Final    NO GROWTH 5 DAYS Performed at  [facility], [address], [place], [place] [address]    Report Status 03/07/2020 FINAL  Final  SARS Coronavirus 2 by RT PCR (hospital order, performed in [facility] hospital lab) Nasopharyngeal Nasopharyngeal Swab     Status: Abnormal   Collection Time: 03/02/20  7:35 PM   Specimen: Nasopharyngeal Swab  Result Value Ref Range Status   SARS Coronavirus 2 POSITIVE (A) NEGATIVE Final    Comment: RESULT CALLED TO, READ BACK BY AND VERIFIED WITH: RESULT CALLED TO, READ BACK BY AND VERIFIED WITH: J GLOOSTER RN 03/02/20 2048 JDW (NOTE) SARS-CoV-2 target nucleic acids are DETECTED  SARS-CoV-2 RNA is generally detectable in upper respiratory specimens  during the acute phase of infection.  Positive results are indicative  of the presence of the identified virus, but do not rule out bacterial infection or co-infection with other pathogens not detected by the test.  Clinical correlation with patient history and  other diagnostic information is necessary to determine patient infection status.  The expected result is negative.  Fact Sheet for Patients:   BoilerBrush.com.cy   Fact Sheet for Healthcare Providers:   https://pope.com/    This test is not yet approved or cleared by the [place] FDA and  has been authorized for detection and/or diagnosis of SARS-CoV-2 by FDA under an Emergency Use Authorization (EUA).  Th is EUA will remain in effect (meaning this test can be used) for the duration of  the COVID-19 declaration under Section 564(b)(1) of the Act, 21 U.S.C. section 360-bbb-3(b)(1), unless the authorization is terminated or revoked sooner.  Performed at [facility], [address] [place],  [place] [address]        Today   Subjective:   Thailyn Khalid today has no headache,no chest abdominal pain,no new weakness tingling or numbness, feels much better today, she did tolerate good breakfast and lunch as  discussed with staff, as well she did ambulate in the hallway with no hypoxia on room air.  Objective:   Blood pressure (!) 143/101, pulse 90, temperature 97.6 F (36.4 C), temperature source Oral, resp. rate 13, height  (1.676 m), weight 77.1 kg, SpO2 94 %.   Intake/Output Summary (Last 24 hours) at 03/07/2020 1546 Last data filed at 03/07/2020 1349 Gross per 24 hour  Intake 2058.17 ml  Output --  Net 2058.17 ml    Exam Awake Alert, Oriented x 3, No new F.N deficits, Normal affect Symmetrical Chest wall movement, Good air movement bilaterally, CTAB RRR,No Gallops,Rubs or new Murmurs, No Parasternal Heave +ve B.Sounds, Abd Soft, Non tender, No organomegaly appriciated, No rebound -guarding or rigidity. No Cyanosis, Clubbing or edema, No new Rash or bruise  Data Review   CBC w Diff:  Lab Results  Component Value Date   WBC 9.5 03/07/2020   HGB 12.6 03/07/2020   HCT 37.1 03/07/2020   PLT 373 03/07/2020   LYMPHOPCT 6 03/07/2020   MONOPCT 7 03/07/2020   EOSPCT 0 03/07/2020   BASOPCT 0 03/07/2020    CMP:  Lab Results  Component Value Date   NA 137 03/07/2020   K 4.2 03/07/2020   CL 103 03/07/2020   CO2 24 03/07/2020   BUN 8 03/07/2020   CREATININE 0.53 03/07/2020   PROT 5.2 (L) 03/07/2020   ALBUMIN 2.4 (L) 03/07/2020   BILITOT 0.5 03/07/2020   ALKPHOS 76 03/07/2020   AST 20 03/07/2020   ALT 13 03/07/2020  .   Total Time in preparing paper work, data evaluation and todays exam - 35 minutes  Huey Bienenstock M.D on 03/07/2020 at 3:46 PM  [facility]   Office  [phone]

## 2020-03-07 NOTE — Discharge Instructions (Signed)
Person Under Monitoring Name: Amanda Frederick  Location: [address] [place] [address]   Infection Prevention Recommendations for Individuals Confirmed to have, or Being Evaluated for, 2019 Novel Coronavirus (COVID-19) Infection Who Receive Care at Home  Individuals who are confirmed to have, or are being evaluated for, COVID-19 should follow the prevention steps below until a healthcare provider or local or state health department says they can return to normal activities.  Stay home except to get medical care You should restrict activities outside your home, except for getting medical care. Do not go to work, school, or public areas, and do not use public transportation or taxis.  Call ahead before visiting your doctor Before your medical appointment, call the healthcare provider and tell them that you have, or are being evaluated for, COVID-19 infection. This will help the healthcare provider's office take steps to keep other people from getting infected. Ask your healthcare provider to call the local or state health department.  Monitor your symptoms Seek prompt medical attention if your illness is worsening (e.g., difficulty breathing). Before going to your medical appointment, call the healthcare provider and tell them that you have, or are being evaluated for, COVID-19 infection. Ask your healthcare provider to call the local or state health department.  Wear a facemask You should wear a facemask that covers your nose and mouth when you are in the same room with other people and when you visit a healthcare provider. People who live with or visit you should also wear a facemask while they are in the same room with you.  Separate yourself from other people in your home As much as possible, you should stay in a different room from other people in your home. Also, you should use a separate bathroom, if available.  Avoid sharing household items You should not  share dishes, drinking glasses, cups, eating utensils, towels, bedding, or other items with other people in your home. After using these items, you should wash them thoroughly with soap and water.  Cover your coughs and sneezes Cover your mouth and nose with a tissue when you cough or sneeze, or you can cough or sneeze into your sleeve. Throw used tissues in a lined trash can, and immediately wash your hands with soap and water for at least 20 seconds or use an alcohol-based hand rub.  Wash your [employer] your hands often and thoroughly with soap and water for at least 20 seconds. You can use an alcohol-based hand sanitizer if soap and water are not available and if your hands are not visibly dirty. Avoid touching your eyes, nose, and mouth with unwashed hands.   Prevention Steps for Caregivers and Household Members of Individuals Confirmed to have, or Being Evaluated for, COVID-19 Infection Being Cared for in the Home  If you live with, or provide care at home for, a person confirmed to have, or being evaluated for, COVID-19 infection please follow these guidelines to prevent infection:  Follow healthcare provider's instructions Make sure that you understand and can help the patient follow any healthcare provider instructions for all care.  Provide for the patient's basic needs You should help the patient with basic needs in the home and provide support for getting groceries, prescriptions, and other personal needs.  Monitor the patient's symptoms If they are getting sicker, call his or her medical provider and tell them that the patient has, or is being evaluated for, COVID-19 infection. This will help the healthcare provider's office  take steps to keep other people from getting infected. Ask the healthcare provider to call the local or state health department.  Limit the number of people who have contact with the patient  If possible, have only one caregiver for the  patient.  Other household members should stay in another home or place of residence. If this is not possible, they should stay  in another room, or be separated from the patient as much as possible. Use a separate bathroom, if available.  Restrict visitors who do not have an essential need to be in the home.  Keep older adults, very young children, and other sick people away from the patient Keep older adults, very young children, and those who have compromised immune systems or chronic health conditions away from the patient. This includes people with chronic heart, lung, or kidney conditions, diabetes, and cancer.  Ensure good ventilation Make sure that shared spaces in the home have good air flow, such as from an air conditioner or an opened window, weather permitting.  Wash your hands often  Wash your hands often and thoroughly with soap and water for at least 20 seconds. You can use an alcohol based hand sanitizer if soap and water are not available and if your hands are not visibly dirty.  Avoid touching your eyes, nose, and mouth with unwashed hands.  Use disposable paper towels to dry your hands. If not available, use dedicated cloth towels and replace them when they become wet.  Wear a facemask and gloves  Wear a disposable facemask at all times in the room and gloves when you touch or have contact with the patient's blood, body fluids, and/or secretions or excretions, such as sweat, saliva, sputum, nasal mucus, vomit, urine, or feces.  Ensure the mask fits over your nose and mouth tightly, and do not touch it during use.  Throw out disposable facemasks and gloves after using them. Do not reuse.  Wash your hands immediately after removing your facemask and gloves.  If your personal clothing becomes contaminated, carefully remove clothing and launder. Wash your hands after handling contaminated clothing.  Place all used disposable facemasks, gloves, and other waste in a lined  container before disposing them with other household waste.  Remove gloves and wash your hands immediately after handling these items.  Do not share dishes, glasses, or other household items with the patient  Avoid sharing household items. You should not share dishes, drinking glasses, cups, eating utensils, towels, bedding, or other items with a patient who is confirmed to have, or being evaluated for, COVID-19 infection.  After the person uses these items, you should wash them thoroughly with soap and water.  Wash laundry thoroughly  Immediately remove and wash clothes or bedding that have blood, body fluids, and/or secretions or excretions, such as sweat, saliva, sputum, nasal mucus, vomit, urine, or feces, on them.  Wear gloves when handling laundry from the patient.  Read and follow directions on labels of laundry or clothing items and detergent. In general, wash and dry with the warmest temperatures recommended on the label.  Clean all areas the individual has used often  Clean all touchable surfaces, such as counters, tabletops, doorknobs, bathroom fixtures, toilets, phones, keyboards, tablets, and bedside tables, every day. Also, clean any surfaces that may have blood, body fluids, and/or secretions or excretions on them.  Wear gloves when cleaning surfaces the patient has come in contact with.  Use a diluted bleach solution (e.g., dilute bleach with 1 part  bleach and 10 parts water) or a household disinfectant with a label that says EPA-registered for coronaviruses. To make a bleach solution at home, add 1 tablespoon of bleach to 1 quart (4 cups) of water. For a larger supply, add  cup of bleach to 1 gallon (16 cups) of water.  Read labels of cleaning products and follow recommendations provided on product labels. Labels contain instructions for safe and effective use of the cleaning product including precautions you should take when applying the product, such as wearing gloves or  eye protection and making sure you have good ventilation during use of the product.  Remove gloves and wash hands immediately after cleaning.  Monitor yourself for signs and symptoms of illness Caregivers and household members are considered close contacts, should monitor their health, and will be asked to limit movement outside of the home to the extent possible. Follow the monitoring steps for close contacts listed on the symptom monitoring form.   ? If you have additional questions, contact your local health department or call the epidemiologist on call at [phone] (available 24/7). ? This guidance is subject to change. For the most up-to-date guidance from [facility], please refer to their website: YouBlogs.pl

## 2020-03-07 NOTE — Progress Notes (Signed)
SATURATION QUALIFICATIONS: (This note is used to comply with regulatory documentation for home oxygen)  Patient Saturations on Room Air at Rest = 93%  Patient Saturations on Room Air while Ambulating = 94%  Patient Saturations on 0 Liters of oxygen while Ambulating = 94%  Please briefly explain why patient needs home oxygen: Pt not requiring oxygen at this time

## 2020-03-07 NOTE — Progress Notes (Signed)
Amanda Frederick to be D/C'd Home per MD order.  Discussed with the patient and all questions fully answered.  VSS, Skin clean, dry and intact without evidence of skin break down, no evidence of skin tears noted. IV catheter discontinued intact. Site without signs and symptoms of complications. Dressing and pressure applied.  An After Visit Summary was printed and given to the patient. Patient received prescription.  D/c education completed with patient including follow up instructions, medication list, d/c activities limitations if indicated, with other d/c instructions as indicated by MD - patient able to verbalize understanding, all questions fully answered.   Patient instructed to return to ED, call 911, or call MD for any changes in condition.   Patient escorted via WC, and D/C home via private auto.  Quincy Carnes 03/07/2020 4:28 PM

## 2020-03-08 ENCOUNTER — Other Ambulatory Visit: Payer: Self-pay

## 2020-03-08 DIAGNOSIS — E1169 Type 2 diabetes mellitus with other specified complication: Secondary | ICD-10-CM

## 2020-03-08 DIAGNOSIS — U071 COVID-19: Secondary | ICD-10-CM

## 2020-03-08 NOTE — Consult Note (Signed)
   [facility] CM Inpatient Consult   03/08/2020  Amanda Frederick [birth year] [id number]   Late entry: for 03/07/20  [facility] [[facility]]  Accountable Care Organization [ACO] Patient: [facility]  Patient screened for complex disease management and admitted with COVID -19.  Home to self care.  Diabetes - Hemoglobin A1C 9.7 noted.  Patient transitioned yesterday evening.  Plan:  Assign to [facility] RN Care Management for post hospital follow up and support today.  Charlesetta Shanks, RN BSN CCM [facility]  [phone] business mobile phone Toll free office [phone]  Fax number: [phone] [email or web address]

## 2020-03-09 ENCOUNTER — Other Ambulatory Visit: Payer: 59 | Admitting: *Deleted

## 2020-03-09 NOTE — Patient Outreach (Signed)
[facility]) Care Management  03/09/2020  Bobbijo Holst January 16, 1971 [id number]   Telephone Assessment-Unsuccessful   RN attempted outreach call to pt however unsuccessful. RN able to leave a HIPAA approved voice message requesting a call back.  Pan: Will attempt another outreach call next week.  Elliot Cousin, RN Care Management Coordinator [facility] Main Office [phone]

## 2020-03-15 ENCOUNTER — Other Ambulatory Visit: Payer: Self-pay | Admitting: *Deleted

## 2020-03-15 NOTE — Patient Outreach (Signed)
[facility]) Care Management  03/15/2020  Lynleigh Kovack 05/13/1971 [id number]   Telephone  Assessment-Unsuccessful Outreach #2  RN attempted outreach call to pt today however unsuccessful. RN able to leave a HIPAA approved voice message requesting a call back. Will further engage at that time.  Plan: Will follow up with another outreach call next week.  Elliot Cousin, RN Care Management Coordinator [facility] Main Office [phone]

## 2020-03-21 ENCOUNTER — Other Ambulatory Visit: Payer: Self-pay | Admitting: *Deleted

## 2020-03-21 NOTE — Patient Outreach (Signed)
[facility]) Care Management  03/21/2020  Niasia Lanphear 06/13/1971 [id number]  Telephone Assessment-Unsuccessful  RN unsuccessful on the 3rd outreach attempt and left a HIPAA approved voice message requesting a call back.   Plan: Will continue outreach calls according to protocol and follow up once again next month.  Elliot Cousin, RN Care Management Coordinator [facility] Main Office [phone]

## 2020-04-19 ENCOUNTER — Other Ambulatory Visit: Payer: Self-pay | Admitting: *Deleted

## 2020-04-19 NOTE — Patient Outreach (Signed)
[facility]) Care Management  04/19/2020  Boots Mcglown 04-15-1971 [id number]   Case Closure  Several outreach attempts and outreach letter all unsuccessful with no return calls. RN attempted another outreach today however only able to leave a HIPAA approved voice message requesting a call back for pending services.  Plan: Will close based upon no response from this pt and RN will alert the provider on pt's disposition with [facility]. Note Epic indicates no provider visit since 2015.  Elliot Cousin, RN Care Management Coordinator [facility] Main Office [phone]

## 2020-07-03 ENCOUNTER — Encounter: Payer: Self-pay | Admitting: Internal Medicine

## 2020-10-10 ENCOUNTER — Encounter: Payer: Self-pay | Admitting: Endocrinology

## 2020-12-25 ENCOUNTER — Emergency Department (HOSPITAL_COMMUNITY): Payer: 59

## 2020-12-25 ENCOUNTER — Encounter (HOSPITAL_COMMUNITY): Payer: Self-pay | Admitting: Emergency Medicine

## 2020-12-25 ENCOUNTER — Other Ambulatory Visit: Payer: Self-pay

## 2020-12-25 ENCOUNTER — Emergency Department (HOSPITAL_COMMUNITY)
Admission: EM | Admit: 2020-12-25 | Discharge: 2020-12-26 | Disposition: A | Discharge status: Home / Self Care | Payer: 59 | Attending: Emergency Medicine | Admitting: Emergency Medicine

## 2020-12-25 DIAGNOSIS — M542 Cervicalgia: Secondary | ICD-10-CM | POA: Diagnosis not present

## 2020-12-25 DIAGNOSIS — I10 Essential (primary) hypertension: Secondary | ICD-10-CM | POA: Insufficient documentation

## 2020-12-25 DIAGNOSIS — W010XXA Fall on same level from slipping, tripping and stumbling without subsequent striking against object, initial encounter: Secondary | ICD-10-CM | POA: Diagnosis not present

## 2020-12-25 DIAGNOSIS — E1140 Type 2 diabetes mellitus with diabetic neuropathy, unspecified: Secondary | ICD-10-CM | POA: Insufficient documentation

## 2020-12-25 DIAGNOSIS — S99912A Unspecified injury of left ankle, initial encounter: Secondary | ICD-10-CM | POA: Diagnosis present

## 2020-12-25 DIAGNOSIS — Z8616 Personal history of COVID-19: Secondary | ICD-10-CM | POA: Diagnosis not present

## 2020-12-25 DIAGNOSIS — S82842A Displaced bimalleolar fracture of left lower leg, initial encounter for closed fracture: Secondary | ICD-10-CM | POA: Insufficient documentation

## 2020-12-25 DIAGNOSIS — Z794 Long term (current) use of insulin: Secondary | ICD-10-CM | POA: Insufficient documentation

## 2020-12-25 DIAGNOSIS — Z79899 Other long term (current) drug therapy: Secondary | ICD-10-CM | POA: Diagnosis not present

## 2020-12-25 MED ORDER — HYDROMORPHONE HCL 1 MG/ML IJ SOLN
1.0000 mg | Freq: Once | INTRAMUSCULAR | Status: AC
  Administered 2020-12-25: 1 mg via INTRAVENOUS
  Filled 2020-12-25: qty 1

## 2020-12-25 NOTE — Progress Notes (Signed)
Orthopedic Tech Progress Note Patient Details:  Amanda Frederick [birth year] [id number]  Ortho Devices Type of Ortho Device: Post (short leg) splint,Stirrup splint,Crutches Splint Material: Fiberglass Ortho Device/Splint Location: Left Lower Extremity Ortho Device/Splint Interventions: Ordered,Application   Post Interventions Patient Tolerated: Well Instructions Provided: Care of device,Poper ambulation with device   Ashia Dehner P Harle Stanford 12/25/2020, 11:21 PM

## 2020-12-25 NOTE — ED Provider Notes (Signed)
[facility] EMERGENCY DEPARTMENT Provider Note   CSN: [id number] Arrival date & time: 12/25/20  2131     History Chief Complaint  Patient presents with  . Fall    Amanda Frederick is a 50 y.o. female.  HPI Patient presents after fall.  Tripped leaving her house.  Complaining of pain in her neck in particular left ankle.  Initially had reportedly told some right ankle pain but patient tells me there is no pain.  No loss conscious.  Not on anticoagulation.  States she does have chronic neck pain due to his bulging disks.  No chest or abdominal pain.  No numbness or weakness.    Past Medical History:  Diagnosis Date  . AC (acromioclavicular) joint bone spurs   . Anxiety   . Depression   . Diabetes (HCC)   . High blood pressure   . High cholesterol   . Migraines   . Neuropathy    eyes , hands and feet     Patient Active Problem List   Diagnosis Date Noted  . Diabetes mellitus type 2, uncontrolled, with complications (HCC) 03/03/2020  . Diabetic neuropathy (HCC) 03/03/2020  . Anxiety 03/03/2020  . HLD (hyperlipidemia) 03/03/2020  . Acute respiratory failure with hypoxia (HCC) 03/03/2020  . COVID-19 virus infection 03/02/2020  . Neck pain 04/22/2013  . Diabetes (HCC)   . High cholesterol   . Depression   . 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 Unspecified hereditary and idiopathic peripheral neuropathy 03/21/2013  . 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 Essential hypertension 03/21/2013  . Low back pain 03/21/2013  . Polycythemia, secondary 03/21/2013  . Dyslipidemia 03/21/2013    Past Surgical History:  Procedure Laterality Date  . NASAL SINUS SURGERY    . TUBAL LIGATION    . VAGINAL HYSTERECTOMY       OB History   No obstetric history on file.     Family History   Problem Relation Age of Onset  . Anemia Mother   . High Cholesterol Mother   . Diabetes Mother   . Arthritis Mother     Social History   Tobacco Use  . Smoking status: Never Smoker  . Smokeless tobacco: Never Used  Substance Use Topics  . Alcohol use: No  . Drug use: No    Home Medications Prior to Admission medications   Medication Sig Start Date End Date Taking? Authorizing Provider  methocarbamol (ROBAXIN) 500 MG tablet Take 1 tablet (500 mg total) by mouth every 8 (eight) hours as needed for muscle spasms. 12/26/20  Yes Benjiman Core, MD  oxyCODONE-acetaminophen (PERCOCET/ROXICET) 5-325 MG tablet Take 1-2 tablets by mouth every 8 (eight) hours as needed for severe pain. 12/26/20  Yes Benjiman Core, MD  albuterol (VENTOLIN HFA) 108 (90 Base) MCG/ACT inhaler Inhale 1 puff into the lungs every 6 (six) hours as needed for wheezing or shortness of breath.    [provider]  cholecalciferol (VITAMIN D) 1000 UNITS tablet Take 4,000 Units by mouth daily.    [provider]  dexamethasone (DECADRON) 6 MG tablet Take 1 tablet (6 mg total) by mouth daily. 03/08/20   Elgergawy, Leana Roe, MD  DULoxetine (CYMBALTA) 60 MG capsule Take 1 capsule (60 mg total) by mouth daily. Patient not taking: Reported on 03/02/2020 04/22/13   Levert Feinstein, MD  fish oil-omega-3 fatty acids 1000 MG  capsule Take 2 g by mouth daily.    [provider]  glucose blood (BAYER CONTOUR NEXT TEST) test strip Test 8-10 times per day 8-10 lancets/day Patient not taking: Reported on 03/02/2020 06/14/13   Romero Belling, MD  insulin aspart (NOVOLOG) 100 UNIT/ML injection For use in pump, total of 120 units per day Patient not taking: Reported on 03/02/2020 06/14/13   Romero Belling, MD  Insulin Infusion Pump Supplies (PARADIGM QUICK-SET 32" ) MISC 1 Device by Does not apply route every 3 (three) days. Patient not taking: Reported on 03/02/2020    [provider]  lisinopril (ZESTRIL) 20 MG  tablet Take 20 mg by mouth daily.    [provider]  NOVOLIN N RELION 100 UNIT/ML injection Inject 60 Units into the skin in the morning and at bedtime.  02/21/20   [provider]  pantoprazole (PROTONIX) 40 MG tablet Take 1 tablet (40 mg total) by mouth daily for 30 doses. 03/07/20 04/06/20  Elgergawy, Leana Roe, MD  pravastatin (PRAVACHOL) 20 MG tablet Take 20 mg by mouth daily.    [provider]    Allergies    Other  Review of Systems   Review of Systems  Constitutional: Negative for appetite change.  HENT: Negative for congestion.   Respiratory: Negative for shortness of breath.   Gastrointestinal: Negative for abdominal pain.  Genitourinary: Negative for flank pain.  Musculoskeletal: Positive for neck pain. Negative for back pain.  Skin: Negative for wound.  Neurological: Negative for weakness.  Psychiatric/Behavioral: Negative for confusion.    Physical Exam Updated Vital Signs BP 136/89 (BP Location: Right Arm)   Pulse (!) 103   Temp 98.1 F (36.7 C) (Oral)   Resp 16   SpO2 98%   Physical Exam Vitals reviewed.  HENT:     Head: Atraumatic.  Eyes:     Pupils: Pupils are equal, round, and reactive to light.  Neck:     Comments: Mild pain with movement.  Patient states this is her baseline.  No deformity.  No midline tenderness. Cardiovascular:     Rate and Rhythm: Normal rate.  Chest:     Chest wall: No tenderness.  Abdominal:     Tenderness: There is no abdominal tenderness.  Musculoskeletal:        General: Tenderness present.     Cervical back: Neck supple.     Comments: Tenderness over left ankle and proximal left fibula.  Some swelling in foot is mildly rotated.  Skin intact.  Sensation intact over foot.  Skin:    Capillary Refill: Capillary refill takes less than 2 seconds.  Neurological:     Mental Status: She is alert and oriented to person, place, and time.     ED Results / Procedures / Treatments   Labs (all labs  ordered are listed, but only abnormal results are displayed) Labs Reviewed - No data to display  EKG None  Radiology DG Tibia/Fibula Left  Result Date: 12/25/2020 CLINICAL DATA:  Fall EXAM: LEFT TIBIA AND FIBULA - 2 VIEW COMPARISON:  None. FINDINGS: Acute mildly displaced medial and lateral malleolar fractures. Slight widening of the anterior joint space. Proximal tibia and fibula appear intact. IMPRESSION: Acute mildly displaced medial and lateral malleolar fractures. Electronically Signed   By: Jasmine Pang M.D.   On: 12/25/2020 22:11   DG Ankle Complete Left  Result Date: 12/25/2020 CLINICAL DATA:  Fall EXAM: LEFT ANKLE COMPLETE - 3+ VIEW COMPARISON:  None. FINDINGS: Acute mildly displaced medial  malleolar fracture. Acute distal fibular fracture with about 1/3 shaft diameter lateral displacement of distal fracture fragment. Possible posterior malleolar fracture of the tibia. Ankle mortise grossly symmetric on mortise view. Diffuse soft tissue swelling. No dislocation evident IMPRESSION: Acute mildly displaced bimalleolar fracture with possible mild fracture deformity of the posterior malleolus. Electronically Signed   By: Jasmine Pang M.D.   On: 12/25/2020 22:10   CT Cervical Spine Wo Contrast  Result Date: 12/26/2020 CLINICAL DATA:  Fall with ankle deformity EXAM: CT CERVICAL SPINE WITHOUT CONTRAST TECHNIQUE: Multidetector CT imaging of the cervical spine was performed without intravenous contrast. Multiplanar CT image reconstructions were also generated. COMPARISON:  MRI 11/24/2008 FINDINGS: Alignment: Straightening of the cervical spine. No subluxation. Facet alignment within normal limits Skull base and vertebrae: No acute fracture. No primary bone lesion or focal pathologic process. Soft tissues and spinal canal: No prevertebral fluid or swelling. No visible canal hematoma. Disc levels: Multilevel degenerative change with advanced disease at C5-C6 and C6-C7. Facet degenerative changes at  multiple levels with multiple level foraminal stenosis. Upper chest: Negative. Other: None IMPRESSION: Straightening of the cervical spine with degenerative changes. No definite acute osseous abnormality Electronically Signed   By: Jasmine Pang M.D.   On: 12/26/2020 00:16    Procedures Procedures   Medications Ordered in ED Medications  HYDROmorphone (DILAUDID) injection 1 mg (1 mg Intravenous Given 12/25/20 2151)  oxyCODONE-acetaminophen (PERCOCET/ROXICET) 5-325 MG per tablet 1 tablet (1 tablet Oral Given 12/26/20 0022)  ondansetron (ZOFRAN-ODT) disintegrating tablet 4 mg (4 mg Oral Given 12/26/20 0037)    ED Course  I have reviewed the triage vital signs and the nursing notes.  Pertinent labs & imaging results that were available during my care of the patient were reviewed by me and considered in my medical decision making (see chart for details).    MDM Rules/Calculators/A&P                          Patient with fall.  Pain in left ankle and shows bimalleolar fracture with potential trimalleolar fracture.  Only minimal displacement however.  Immobilized.  Discussed with Dr. Magnus Ivan and can see in follow-up.  Also will get cervical spine imaging due to some mild pain with movement.  Will discharge home with pain medicines.  No other apparent injury.  Not on anticoagulation. Final Clinical Impression(s) / ED Diagnoses Final diagnoses:  Closed bimalleolar fracture of left ankle, initial encounter    Rx / DC Orders ED Discharge Orders         Ordered    oxyCODONE-acetaminophen (PERCOCET/ROXICET) 5-325 MG tablet  Every 8 hours PRN        12/26/20 0013    methocarbamol (ROBAXIN) 500 MG tablet  Every 8 hours PRN        12/26/20 Doreen Beam, MD 12/26/20 2328

## 2020-12-25 NOTE — ED Triage Notes (Signed)
Pt BIB [facility] from home, pt c/o bilateral ankle pain and deformity. Denies hitting her head/LOC. Given fentanyl pta.

## 2020-12-26 MED ORDER — ONDANSETRON 4 MG PO TBDP
4.0000 mg | ORAL_TABLET | Freq: Once | ORAL | Status: AC
  Administered 2020-12-26: 4 mg via ORAL
  Filled 2020-12-26: qty 1

## 2020-12-26 MED ORDER — OXYCODONE-ACETAMINOPHEN 5-325 MG PO TABS
1.0000 | ORAL_TABLET | Freq: Once | ORAL | Status: AC
  Administered 2020-12-26: 1 via ORAL
  Filled 2020-12-26: qty 1

## 2020-12-26 MED ORDER — METHOCARBAMOL 500 MG PO TABS: 500.0000 mg | ORAL_TABLET | Freq: Three times a day (TID) | ORAL | 0 refills | Status: DC | PRN

## 2020-12-26 MED ORDER — OXYCODONE-ACETAMINOPHEN 5-325 MG PO TABS: 1.0000 | ORAL_TABLET | Freq: Three times a day (TID) | ORAL | 0 refills | Status: DC | PRN

## 2021-01-02 ENCOUNTER — Encounter: Payer: Self-pay | Admitting: Orthopaedic Surgery

## 2021-01-02 ENCOUNTER — Other Ambulatory Visit: Payer: Self-pay

## 2021-01-02 ENCOUNTER — Ambulatory Visit (INDEPENDENT_AMBULATORY_CARE_PROVIDER_SITE_OTHER): Payer: 59 | Admitting: Orthopaedic Surgery

## 2021-01-02 DIAGNOSIS — S82842A Displaced bimalleolar fracture of left lower leg, initial encounter for closed fracture: Secondary | ICD-10-CM | POA: Diagnosis not present

## 2021-01-02 MED ORDER — METHOCARBAMOL 500 MG PO TABS: 500.0000 mg | ORAL_TABLET | Freq: Four times a day (QID) | ORAL | 1 refills | Status: DC | PRN

## 2021-01-02 MED ORDER — OXYCODONE-ACETAMINOPHEN 5-325 MG PO TABS: 1.0000 | ORAL_TABLET | Freq: Four times a day (QID) | ORAL | 0 refills | Status: DC | PRN

## 2021-01-02 NOTE — Progress Notes (Signed)
Office Visit Note   Patient: Amanda Frederick           Date of Birth: 1971-07-22           MRN: [id number] Visit Date: 01/02/2021              Requested by: Amanda Fusi, MD [address] [place],  [place] [address] PCP: Amanda Fusi, MD   Assessment & Plan: Visit Diagnoses:  1. Displaced bimalleolar fracture of left lower leg, initial encounter for closed fracture     Plan: Given the nature of this unstable left ankle fracture, I have recommended open reduction/internal fixation.  I explained how the surgery occurs and how it is performed.  I showed her ankle model and went over her x-rays in detail.  The highest risk for this type of surgery is infection as well as posttraumatic arthritis.  I would have her in the hospital overnight for antibiotics as well and potentially would send her out on oral antibiotics after surgery given her poor diabetic control.  She will need to be nonweightbearing for the next 2 to 4 weeks following surgery.  All questions and concerns were answered and addressed.  I will refill her Percocet as well.  We would then see her back in 2 weeks postoperative for suture removal and repeat 3 views of the left operative ankle.  Follow-Up Instructions: Return for 2 weeks post-op.   Orders:  No orders of the defined types were placed in this encounter.  Meds ordered this encounter  Medications  . oxyCODONE-acetaminophen (PERCOCET/ROXICET) 5-325 MG tablet    Sig: Take 1-2 tablets by mouth every 6 (six) hours as needed for severe pain.    Dispense:  30 tablet    Refill:  0      Procedures: No procedures performed   Clinical Data: No additional findings.   Subjective: Chief Complaint  Patient presents with  . Right Ankle - Fracture, Pain  The patient comes in today for evaluation treatment of a left bimalleolar unstable ankle fracture.  This happened after a mechanical fall just over a week ago.  She was referred from the  emergency room.  She was placed appropriate in a splint.  She is having a similar pain without left ankle which is appropriate given the nature of this fracture.  She is a diabetic and has reported high blood glucose levels.  Her husband is with her today as well.  HPI  Review of Systems There is currently listed no headache, chest pain, shortness of breath, fever, chills, nausea, vomiting  Objective: Vital Signs: There were no vitals taken for this visit.  Physical Exam She is alert and oriented x3 and in no acute distress Ortho Exam The left ankle is splinted.  I will have my assistant to adjust the splint and loosening it at least to make it more comfortable.  Her foot is well-perfused. Specialty Comments:  No specialty comments available.  Imaging: No results found. X-rays on the canopy system were reviewed with the patient and independently reviewed.  She has a unstable bimalleolar ankle fracture.  Fortunately the ankle joint is congruent.  PMFS History: Patient Active Problem List   Diagnosis Date Noted  . Displaced bimalleolar fracture of left lower leg, initial encounter for closed fracture 01/02/2021  . Diabetes mellitus type 2, uncontrolled, with complications (HCC) 03/03/2020  . Diabetic neuropathy (HCC) 03/03/2020  . Anxiety 03/03/2020  . HLD (hyperlipidemia) 03/03/2020  . Acute respiratory  failure with hypoxia (HCC) 03/03/2020  . COVID-19 virus infection 03/02/2020  . Neck pain 04/22/2013  . Diabetes (HCC)   . High cholesterol   . Depression   . 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 Unspecified hereditary and idiopathic peripheral neuropathy 03/21/2013  . 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 Essential hypertension 03/21/2013  . Low back pain 03/21/2013  . Polycythemia, secondary 03/21/2013  .  Dyslipidemia 03/21/2013   Past Medical History:  Diagnosis Date  . AC (acromioclavicular) joint bone spurs   . Anxiety   . Depression   . Diabetes (HCC)   . High blood pressure   . High cholesterol   . Migraines   . Neuropathy    eyes , hands and feet     Family History  Problem Relation Age of Onset  . Anemia Mother   . High Cholesterol Mother   . Diabetes Mother   . Arthritis Mother     Past Surgical History:  Procedure Laterality Date  . NASAL SINUS SURGERY    . TUBAL LIGATION    . VAGINAL HYSTERECTOMY     Social History   Occupational History    Comment: Home maker  Tobacco Use  . Smoking status: Never Smoker  . Smokeless tobacco: Never Used  Substance and Sexual Activity  . Alcohol use: No  . Drug use: No  . Sexual activity: Not on file

## 2021-01-02 NOTE — Progress Notes (Signed)
Please enter orders for surgery 01-04-21.

## 2021-01-02 NOTE — Progress Notes (Signed)
COVID Vaccine Completed: Date COVID Vaccine completed: Has received booster: COVID vaccine manufacturer: Pfizer    [employer]   Date of COVID positive in last 90 days:  PCP - Barney Drain, MD Cardiologist -   Chest x-ray - 03-02-20 Epic EKG - 03-05-20 Epic Stress Test -  ECHO -  Cardiac Cath -  Pacemaker/ICD device last checked: Spinal Cord Stimulator:  Sleep Study -  CPAP -   Fasting Blood Sugar -  Checks Blood Sugar _____ times a day  Blood Thinner Instructions: Aspirin Instructions: Last Dose:  Activity level:  Can go up a flight of stairs and perform activities of daily living without stopping and without symptoms of chest pain or shortness of breath.   Able to exercise without symptoms  Unable to go up a flight of stairs without symptoms of      Anesthesia review:   Patient denies shortness of breath, fever, cough and chest pain at PAT appointment   Patient verbalized understanding of instructions that were given to them at the PAT appointment. Patient was also instructed that they will need to review over the PAT instructions again at home before surgery.

## 2021-01-03 ENCOUNTER — Other Ambulatory Visit: Payer: Self-pay

## 2021-01-03 ENCOUNTER — Encounter (HOSPITAL_COMMUNITY): Payer: Self-pay | Admitting: Orthopaedic Surgery

## 2021-01-03 NOTE — Progress Notes (Addendum)
Anesthesia Chart Review:  Amanda Frederick is a same day work up   Case: [id number] Date/Time: 01/04/21 1501   Procedure: OPEN REDUCTION INTERNAL FIXATION (ORIF) LEFT BIMALLEOLAR ANKLE FRACTURE (Left: Ankle)   Anesthesia type: General   Pre-op diagnosis: Left Bimalleolar Ankle Fracture   Location: [facility] ROOM 09 / [facility]   Surgeons: Kathryne Hitch, MD       DISCUSSION: Amanda Frederick is 50 years old with hx HTN, DM, anemia  Hospitalized 8/6-11/21 for COVID-19 virus infection, acute respiratory failure with hypoxia.   Amanda Frederick reported to pre-surgical nurse via telephone she had "died 3 times under anesthesia" during tubal ligation and turbinate surgery. Reports surgeries were at [facility] and [facility] respectively. PCP notes document sinus surgery was in 1990's and tubal ligation was in 1995.  Amanda Frederick does not know further details of anesthesia issues. I will attempt to obtain records from [facility] but given the time since surgery, I suspect I will not be able to get them.    PROVIDERS: - PCP is Paulina Fusi, MD   LABS: Will be obtained day of surgery    IMAGES: CT abd/pelvis 03/03/20:  1. Peripheral ground-glass opacities in the lower lobes consistent with COVID viral pneumonia. 2. No clear acute findings in the abdomen pelvis. 3. Small gas collection in the subcutaneous tissue of the RIGHT abdominal wall. Recommend clinical correlation with subcutaneous injection.  1 view CXR 03/02/20:  - Slight interval worsening in the airspace opacities which could be due to multifocal pneumonia.   EKG 03/02/20: Sinus tachycardia. Probable left atrial enlargement   CV: N/A  Past Medical History:  Diagnosis Date   AC (acromioclavicular) joint bone spurs    Anemia    Anxiety    Arthritis    Bronchitis    Bulging lumbar disc    Bursitis    Complication of anesthesia    "has died 3 separate times under anesthesia"   Depression    Diabetes (HCC)    Environmental allergies    Fibromyalgia    High  blood pressure    High cholesterol    History of COVID-19 01/2020   was hospitalized   History of endometriosis    History of uterine fibroid    Migraines    Neuropathy    eyes , hands and feet    Pinched nerve    Pneumonia    Seizure (HCC)    after eye test only   Spondylosis    Tachycardia    after COVID   Tendonitis    Vision abnormalities     Past Surgical History:  Procedure Laterality Date   NASAL SINUS SURGERY     TUBAL LIGATION     VAGINAL HYSTERECTOMY      MEDICATIONS: No current facility-administered medications for this encounter.    albuterol (VENTOLIN HFA) 108 (90 Base) MCG/ACT inhaler   cholecalciferol (VITAMIN D) 1000 UNITS tablet   fish oil-omega-3 fatty acids 1000 MG capsule   insulin aspart (NOVOLOG) 100 UNIT/ML injection   Insulin Infusion Pump Supplies (PARADIGM QUICK-SET 32" ) MISC   lisinopril (ZESTRIL) 20 MG tablet   methocarbamol (ROBAXIN) 500 MG tablet   oxyCODONE-acetaminophen (PERCOCET/ROXICET) 5-325 MG tablet   dexamethasone (DECADRON) 6 MG tablet   DULoxetine (CYMBALTA) 60 MG capsule   glucose blood (BAYER CONTOUR NEXT TEST) test strip   pantoprazole (PROTONIX) 40 MG tablet   Amanda Frederick will need further evaluation by assigned anesthesiologist day of surgery.   Rica Mast, PhD, FNP-BC [facility]/Anesthesiology Phone: [phone] 01/03/2021 3:27 PM

## 2021-01-03 NOTE — Progress Notes (Addendum)
COVID Vaccine Completed:No Date COVID Vaccine completed: N/A Has received booster: N/A COVID vaccine manufacturer: N/A Date of COVID positive in last 90 days: No   PCP - Barney Drain, MD Cardiologist -N/A Endocrinology-N/A   Chest x-ray - 03-02-20 Epic EKG - 03-05-20 Epic Stress Test -N/A ECHO -N/A Cardiac Cath -N/A Pacemaker/ICD device last checked: N/A Spinal Cord Stimulator: N/A   Sleep Study -N/A CPAP -N/A   Fasting Blood Sugar - 90-120 Checks Blood Sugar continuous monitor   Blood Thinner Instructions: N/A Aspirin Instructions: N/A Last Dose: N/A   Activity level:   perform activities of daily living without stopping and without symptoms of chest pain or shortness of breath.                          Anesthesia review:Patient stated she has died 3 separate times during anesthesia during her tubal ligation and turbinates surgery. Asked for details and that was all she knew. Stated tubal was at [facility] I dont see anesthesia note in epic. Turbinates was at [facility] regional no anesthesia records in epic.   Patient denies shortness of breath, fever, cough and chest pain at PAT appointment     Patient verbalized understanding of instructions that were given to them at the PAT appointment. Patient was also instructed that they will need to review over the PAT instructions again at home before surgery.

## 2021-01-03 NOTE — H&P (Signed)
Amanda Frederick is an 50 y.o. female.   Chief Complaint: Left ankle pain with known unstable bimalleolar ankle fracture HPI: The patient is a 50 year old female who unfortunately sustained a significant mechanical fall recently and was seen in the emergency room.  She was found to have an unstable bimalleolar ankle fracture.  It has been recommended that she undergo open reduction/internal fixation of this unstable fracture.  Past Medical History:  Diagnosis Date   AC (acromioclavicular) joint bone spurs    Anemia    Anxiety    Arthritis    Bronchitis    Bulging lumbar disc    Bursitis    Complication of anesthesia    "has died 3 separate times under anesthesia"   Depression    Diabetes (HCC)    Environmental allergies    Fibromyalgia    High blood pressure    High cholesterol    History of COVID-19 01/2020   was hospitalized   History of endometriosis    History of uterine fibroid    Migraines    Neuropathy    eyes , hands and feet    Pinched nerve    Pneumonia    Seizure (HCC)    after eye test only   Spondylosis    Tachycardia    after COVID   Tendonitis    Vision abnormalities     Past Surgical History:  Procedure Laterality Date   NASAL SINUS SURGERY     TUBAL LIGATION     VAGINAL HYSTERECTOMY      Family History  Problem Relation Age of Onset   Anemia Mother    High Cholesterol Mother    Diabetes Mother    Arthritis Mother    Social History:  reports that she has never smoked. She has never used smokeless tobacco. She reports previous alcohol use. She reports that she does not use drugs.  Allergies:  Allergies  Allergen Reactions   Other Hives    Antibiotic specific one unsure but has tolerated antibiotic recently    No medications prior to admission.    No results found for this or any previous visit (from the past 48 hour(s)). No results found.  Review of Systems  All other systems reviewed and are negative.  Height 5\' 6"  (1.676 m), weight  77.1 kg. Physical Exam Vitals reviewed.  Constitutional:      Appearance: Normal appearance.  HENT:     Head: Normocephalic and atraumatic.  Eyes:     Extraocular Movements: Extraocular movements intact.     Pupils: Pupils are equal, round, and reactive to light.  Cardiovascular:     Rate and Rhythm: Normal rate and regular rhythm.     Pulses: Normal pulses.  Pulmonary:     Effort: Pulmonary effort is normal.     Breath sounds: Normal breath sounds.  Abdominal:     Palpations: Abdomen is soft.  Musculoskeletal:     Cervical back: Normal range of motion and neck supple.     Left ankle: Swelling, deformity and ecchymosis present. Tenderness present over the lateral malleolus and medial malleolus. Decreased range of motion.  Neurological:     Mental Status: She is alert and oriented to person, place, and time.  Psychiatric:        Behavior: Behavior normal.     Assessment/Plan Left unstable bimalleolar ankle fracture  The plan is to proceed to surgery for open reduction/internal fixation of this left unstable ankle fracture.  The risks and benefits of the  surgery have been explained in detail and she understands why it is necessary to recommend and proceed with surgery for this type of ankle injury.  Kathryne Hitch, MD 01/03/2021, 10:03 PM

## 2021-01-03 NOTE — Anesthesia Preprocedure Evaluation (Addendum)
Anesthesia Evaluation  Patient identified by MRN, date of birth, ID band Patient awake    Reviewed: Allergy & Precautions, NPO status , Patient's Chart, lab work & pertinent test results  History of Anesthesia Complications Negative for: history of anesthetic complications  Airway Mallampati: II  TM Distance: >3 FB Neck ROM: Full    Dental no notable dental hx. (+) Dental Advisory Given   Pulmonary neg pulmonary ROS,    Pulmonary exam normal        Cardiovascular hypertension, Pt. on medications Normal cardiovascular exam     Neuro/Psych  Headaches, Seizures -,  PSYCHIATRIC DISORDERS Anxiety Depression    GI/Hepatic negative GI ROS, Neg liver ROS,   Endo/Other  diabetes, Type 1, Insulin Dependent  Renal/GU negative Renal ROS  negative genitourinary   Musculoskeletal negative musculoskeletal ROS (+)   Abdominal   Peds  Hematology negative hematology ROS (+)   Anesthesia Other Findings Day of surgery medications reviewed with patient.  Reproductive/Obstetrics negative OB ROS                           Anesthesia Physical Anesthesia Plan  ASA: 2  Anesthesia Plan: MAC and Regional   Post-op Pain Management:    Induction:   PONV Risk Score and Plan: 3 and Treatment may vary due to age or medical condition, Ondansetron, Dexamethasone and Midazolam  Airway Management Planned: Natural Airway, Simple Face Mask and Nasal Cannula  Additional Equipment:   Intra-op Plan:   Post-operative Plan: Extubation in OR  Informed Consent: I have reviewed the patients History and Physical, chart, labs and discussed the procedure including the risks, benefits and alternatives for the proposed anesthesia with the patient or authorized representative who has indicated his/her understanding and acceptance.     Dental advisory given  Plan Discussed with: Anesthesiologist  Anesthesia Plan Comments:  (See APP note by Durel Salts, FNP )      Anesthesia Quick Evaluation

## 2021-01-04 ENCOUNTER — Ambulatory Visit (HOSPITAL_COMMUNITY): Payer: 59 | Admitting: Emergency Medicine

## 2021-01-04 ENCOUNTER — Observation Stay (HOSPITAL_COMMUNITY)
Admission: RE | Admit: 2021-01-04 | Discharge: 2021-01-05 | Disposition: A | Discharge status: Home / Self Care | Payer: 59 | Attending: Orthopaedic Surgery | Admitting: Orthopaedic Surgery

## 2021-01-04 ENCOUNTER — Encounter (HOSPITAL_COMMUNITY): Payer: Self-pay | Admitting: Orthopaedic Surgery

## 2021-01-04 ENCOUNTER — Encounter (HOSPITAL_COMMUNITY)
Admission: RE | Disposition: A | Discharge status: Home / Self Care | Payer: Self-pay | Source: Home / Self Care | Attending: Orthopaedic Surgery

## 2021-01-04 ENCOUNTER — Ambulatory Visit (HOSPITAL_COMMUNITY): Payer: 59

## 2021-01-04 DIAGNOSIS — E119 Type 2 diabetes mellitus without complications: Secondary | ICD-10-CM | POA: Insufficient documentation

## 2021-01-04 DIAGNOSIS — S82842A Displaced bimalleolar fracture of left lower leg, initial encounter for closed fracture: Secondary | ICD-10-CM | POA: Diagnosis present

## 2021-01-04 DIAGNOSIS — W19XXXA Unspecified fall, initial encounter: Secondary | ICD-10-CM | POA: Insufficient documentation

## 2021-01-04 DIAGNOSIS — Z419 Encounter for procedure for purposes other than remedying health state, unspecified: Secondary | ICD-10-CM

## 2021-01-04 DIAGNOSIS — Z8616 Personal history of COVID-19: Secondary | ICD-10-CM | POA: Diagnosis not present

## 2021-01-04 DIAGNOSIS — Z20822 Contact with and (suspected) exposure to covid-19: Secondary | ICD-10-CM | POA: Insufficient documentation

## 2021-01-04 HISTORY — DX: Anemia, unspecified: D64.9

## 2021-01-04 HISTORY — PX: ORIF ANKLE FRACTURE: SHX5408

## 2021-01-04 HISTORY — DX: Other allergy status, other than to drugs and biological substances: Z91.09

## 2021-01-04 HISTORY — DX: Other complications of anesthesia, initial encounter: T88.59XA

## 2021-01-04 HISTORY — DX: Mononeuropathy, unspecified: G58.9

## 2021-01-04 HISTORY — DX: Spondylosis, unspecified: M47.9

## 2021-01-04 HISTORY — DX: Unspecified osteoarthritis, unspecified site: M19.90

## 2021-01-04 HISTORY — DX: Fibromyalgia: M79.7

## 2021-01-04 HISTORY — DX: Unspecified convulsions: R56.9

## 2021-01-04 HISTORY — DX: Personal history of other benign neoplasm: Z86.018

## 2021-01-04 HISTORY — DX: Pneumonia, unspecified organism: J18.9

## 2021-01-04 HISTORY — DX: Other intervertebral disc displacement, lumbar region: M51.26

## 2021-01-04 HISTORY — DX: Enthesopathy, unspecified: M77.9

## 2021-01-04 HISTORY — DX: Bronchitis, not specified as acute or chronic: J40

## 2021-01-04 HISTORY — DX: Tachycardia, unspecified: R00.0

## 2021-01-04 HISTORY — DX: Other intervertebral disc degeneration, lumbar region: M51.36

## 2021-01-04 HISTORY — DX: Unspecified visual disturbance: H53.9

## 2021-01-04 HISTORY — DX: Bursopathy, unspecified: M71.9

## 2021-01-04 HISTORY — DX: Personal history of other diseases of the female genital tract: Z87.42

## 2021-01-04 HISTORY — DX: Other intervertebral disc degeneration, lumbar region without mention of lumbar back pain or lower extremity pain: M51.369

## 2021-01-04 LAB — CBC
HCT: 41 % (ref 36.0–46.0)
Hemoglobin: 13.5 g/dL (ref 12.0–15.0)
MCH: 28.3 pg (ref 26.0–34.0)
MCHC: 32.9 g/dL (ref 30.0–36.0)
MCV: 86 fL (ref 80.0–100.0)
Platelets: 305 10*3/uL (ref 150–400)
RBC: 4.77 MIL/uL (ref 3.87–5.11)
RDW: 12.4 % (ref 11.5–15.5)
WBC: 6.7 10*3/uL (ref 4.0–10.5)
nRBC: 0 % (ref 0.0–0.2)

## 2021-01-04 LAB — BASIC METABOLIC PANEL
Anion gap: 9 (ref 5–15)
BUN: 15 mg/dL (ref 6–20)
CO2: 21 mmol/L — ABNORMAL LOW (ref 22–32)
Calcium: 8.8 mg/dL — ABNORMAL LOW (ref 8.9–10.3)
Chloride: 109 mmol/L (ref 98–111)
Creatinine, Ser: 0.7 mg/dL (ref 0.44–1.00)
GFR, Estimated: >60 mL/min (ref >60)
Glucose, Bld: 116 mg/dL — ABNORMAL HIGH (ref 70–99)
Potassium: 4.5 mmol/L (ref 3.5–5.1)
Sodium: 139 mmol/L (ref 135–145)

## 2021-01-04 LAB — GLUCOSE, CAPILLARY
Glucose-Capillary: 114 mg/dL — ABNORMAL HIGH (ref 70–99)
Glucose-Capillary: 148 mg/dL — ABNORMAL HIGH (ref 70–99)
Glucose-Capillary: 61 mg/dL — ABNORMAL LOW (ref 70–99)
Glucose-Capillary: 83 mg/dL (ref 70–99)
Glucose-Capillary: 117 mg/dL — ABNORMAL HIGH (ref 70–99)
Glucose-Capillary: 104 mg/dL — ABNORMAL HIGH (ref 70–99)

## 2021-01-04 LAB — SARS CORONAVIRUS 2 BY RT PCR (HOSPITAL ORDER, PERFORMED IN ~~LOC~~ HOSPITAL LAB): SARS Coronavirus 2: NEGATIVE

## 2021-01-04 SURGERY — OPEN REDUCTION INTERNAL FIXATION (ORIF) ANKLE FRACTURE
Anesthesia: Monitor Anesthesia Care | Site: Ankle | Laterality: Left

## 2021-01-04 MED ORDER — PROPOFOL 500 MG/50ML IV EMUL
INTRAVENOUS | Status: DC | PRN
  Administered 2021-01-04 (×2): 20 mg via INTRAVENOUS

## 2021-01-04 MED ORDER — CHLORHEXIDINE GLUCONATE 0.12 % MT SOLN
15.0000 mL | Freq: Once | OROMUCOSAL | Status: AC
  Administered 2021-01-04: 15 mL via OROMUCOSAL

## 2021-01-04 MED ORDER — BUPIVACAINE HCL (PF) 0.25 % IJ SOLN
INTRAMUSCULAR | Status: AC
  Filled 2021-01-04: qty 30

## 2021-01-04 MED ORDER — PROPOFOL 1000 MG/100ML IV EMUL
INTRAVENOUS | Status: AC
  Filled 2021-01-04: qty 100

## 2021-01-04 MED ORDER — MIDAZOLAM HCL 2 MG/2ML IJ SOLN
INTRAMUSCULAR | Status: AC
  Filled 2021-01-04: qty 2

## 2021-01-04 MED ORDER — LABETALOL HCL 5 MG/ML IV SOLN
5.0000 mg | INTRAVENOUS | Status: AC
  Administered 2021-01-04: 5 mg via INTRAVENOUS
  Filled 2021-01-04: qty 4

## 2021-01-04 MED ORDER — METOCLOPRAMIDE HCL 5 MG PO TABS: 5.0000 mg | ORAL_TABLET | Freq: Three times a day (TID) | ORAL | Status: DC | PRN

## 2021-01-04 MED ORDER — METOCLOPRAMIDE HCL 5 MG/ML IJ SOLN: 5.0000 mg | Freq: Three times a day (TID) | INTRAMUSCULAR | Status: DC | PRN

## 2021-01-04 MED ORDER — PROMETHAZINE HCL 25 MG/ML IJ SOLN: 6.2500 mg | INTRAMUSCULAR | Status: DC | PRN

## 2021-01-04 MED ORDER — OXYCODONE HCL 5 MG PO TABS
5.0000 mg | ORAL_TABLET | ORAL | Status: DC | PRN
  Administered 2021-01-05: 10 mg via ORAL
  Administered 2021-01-05: 5 mg via ORAL
  Filled 2021-01-04: qty 2
  Filled 2021-01-04: qty 1

## 2021-01-04 MED ORDER — PANTOPRAZOLE SODIUM 40 MG PO TBEC
40.0000 mg | DELAYED_RELEASE_TABLET | Freq: Every day | ORAL | Status: DC
  Administered 2021-01-05: 40 mg via ORAL
  Filled 2021-01-04: qty 1

## 2021-01-04 MED ORDER — PHENYLEPHRINE HCL-NACL 10-0.9 MG/250ML-% IV SOLN
INTRAVENOUS | Status: DC | PRN
  Administered 2021-01-04: 50 ug/min via INTRAVENOUS

## 2021-01-04 MED ORDER — DEXTROSE 50 % IV SOLN
12.5000 g | INTRAVENOUS | Status: AC
  Administered 2021-01-04: 12.5 g via INTRAVENOUS
  Filled 2021-01-04: qty 50

## 2021-01-04 MED ORDER — HYDROMORPHONE HCL 1 MG/ML IJ SOLN: 0.5000 mg | INTRAMUSCULAR | Status: DC | PRN

## 2021-01-04 MED ORDER — PHENYLEPHRINE 40 MCG/ML (10ML) SYRINGE FOR IV PUSH (FOR BLOOD PRESSURE SUPPORT)
PREFILLED_SYRINGE | INTRAVENOUS | Status: DC | PRN
  Administered 2021-01-04: 80 ug via INTRAVENOUS
  Administered 2021-01-04 (×2): 120 ug via INTRAVENOUS
  Administered 2021-01-04 (×2): 80 ug via INTRAVENOUS
  Administered 2021-01-04: 200 ug via INTRAVENOUS

## 2021-01-04 MED ORDER — ONDANSETRON HCL 4 MG PO TABS: 4.0000 mg | ORAL_TABLET | Freq: Four times a day (QID) | ORAL | Status: DC | PRN

## 2021-01-04 MED ORDER — FENTANYL CITRATE (PF) 100 MCG/2ML IJ SOLN: 25.0000 ug | INTRAMUSCULAR | Status: DC | PRN

## 2021-01-04 MED ORDER — 0.9 % SODIUM CHLORIDE (POUR BTL) OPTIME
TOPICAL | Status: DC | PRN
  Administered 2021-01-04: 1000 mL

## 2021-01-04 MED ORDER — PHENYLEPHRINE 40 MCG/ML (10ML) SYRINGE FOR IV PUSH (FOR BLOOD PRESSURE SUPPORT)
PREFILLED_SYRINGE | INTRAVENOUS | Status: AC
  Filled 2021-01-04: qty 20

## 2021-01-04 MED ORDER — ONDANSETRON HCL 4 MG/2ML IJ SOLN
INTRAMUSCULAR | Status: AC
  Filled 2021-01-04: qty 2

## 2021-01-04 MED ORDER — PROPOFOL 500 MG/50ML IV EMUL
INTRAVENOUS | Status: DC | PRN
  Administered 2021-01-04 (×2): 80 ug/kg/min via INTRAVENOUS

## 2021-01-04 MED ORDER — DOCUSATE SODIUM 100 MG PO CAPS
100.0000 mg | ORAL_CAPSULE | Freq: Two times a day (BID) | ORAL | Status: DC
  Administered 2021-01-05: 100 mg via ORAL
  Filled 2021-01-04: qty 1

## 2021-01-04 MED ORDER — CEFAZOLIN SODIUM-DEXTROSE 1-4 GM/50ML-% IV SOLN
1.0000 g | Freq: Four times a day (QID) | INTRAVENOUS | Status: AC
  Administered 2021-01-05 (×3): 1 g via INTRAVENOUS
  Filled 2021-01-04 (×3): qty 50

## 2021-01-04 MED ORDER — LIDOCAINE 2% (20 MG/ML) 5 ML SYRINGE
INTRAMUSCULAR | Status: AC
  Filled 2021-01-04: qty 5

## 2021-01-04 MED ORDER — MIDAZOLAM HCL 2 MG/2ML IJ SOLN
1.0000 mg | INTRAMUSCULAR | Status: DC
  Administered 2021-01-04: 2 mg via INTRAVENOUS
  Filled 2021-01-04: qty 2

## 2021-01-04 MED ORDER — ACETAMINOPHEN 325 MG PO TABS: 325.0000 mg | ORAL_TABLET | Freq: Four times a day (QID) | ORAL | Status: DC | PRN

## 2021-01-04 MED ORDER — SODIUM CHLORIDE 0.9 % IV SOLN: INTRAVENOUS | Status: AC

## 2021-01-04 MED ORDER — ALBUTEROL SULFATE (2.5 MG/3ML) 0.083% IN NEBU: 2.5000 mg | INHALATION_SOLUTION | Freq: Four times a day (QID) | RESPIRATORY_TRACT | Status: DC | PRN

## 2021-01-04 MED ORDER — ORAL CARE MOUTH RINSE: 15.0000 mL | Freq: Once | OROMUCOSAL | Status: AC

## 2021-01-04 MED ORDER — BUPIVACAINE HCL (PF) 0.5 % IJ SOLN
INTRAMUSCULAR | Status: AC
  Filled 2021-01-04: qty 30

## 2021-01-04 MED ORDER — ONDANSETRON HCL 4 MG/2ML IJ SOLN: 4.0000 mg | Freq: Four times a day (QID) | INTRAMUSCULAR | Status: DC | PRN

## 2021-01-04 MED ORDER — SODIUM CHLORIDE 0.9 % IV SOLN: INTRAVENOUS | Status: DC

## 2021-01-04 MED ORDER — ONDANSETRON HCL 4 MG/2ML IJ SOLN
INTRAMUSCULAR | Status: DC | PRN
  Administered 2021-01-04: 4 mg via INTRAVENOUS

## 2021-01-04 MED ORDER — CEFAZOLIN SODIUM-DEXTROSE 2-4 GM/100ML-% IV SOLN
2.0000 g | Freq: Once | INTRAVENOUS | Status: AC
  Administered 2021-01-04: 2 g via INTRAVENOUS
  Filled 2021-01-04: qty 100

## 2021-01-04 MED ORDER — DEXAMETHASONE SODIUM PHOSPHATE 10 MG/ML IJ SOLN
INTRAMUSCULAR | Status: DC | PRN
  Administered 2021-01-04: 2 mg
  Administered 2021-01-04: 5 mg

## 2021-01-04 MED ORDER — MIDAZOLAM HCL 5 MG/5ML IJ SOLN
INTRAMUSCULAR | Status: DC | PRN
  Administered 2021-01-04: 2 mg via INTRAVENOUS

## 2021-01-04 MED ORDER — VITAMIN D 25 MCG (1000 UNIT) PO TABS
1000.0000 [IU] | ORAL_TABLET | Freq: Every day | ORAL | Status: DC
  Administered 2021-01-05: 1000 [IU] via ORAL
  Filled 2021-01-04: qty 1

## 2021-01-04 MED ORDER — FENTANYL CITRATE (PF) 100 MCG/2ML IJ SOLN
50.0000 ug | INTRAMUSCULAR | Status: DC
Start: 2021-01-04 — End: 2021-01-04
  Filled 2021-01-04: qty 2

## 2021-01-04 MED ORDER — METHOCARBAMOL 500 MG PO TABS: 500.0000 mg | ORAL_TABLET | Freq: Four times a day (QID) | ORAL | Status: DC | PRN

## 2021-01-04 MED ORDER — OXYCODONE HCL 5 MG PO TABS: 10.0000 mg | ORAL_TABLET | ORAL | Status: DC | PRN

## 2021-01-04 MED ORDER — PHENYLEPHRINE HCL (PRESSORS) 10 MG/ML IV SOLN
INTRAVENOUS | Status: AC
  Filled 2021-01-04: qty 1

## 2021-01-04 MED ORDER — LISINOPRIL 20 MG PO TABS
20.0000 mg | ORAL_TABLET | Freq: Every day | ORAL | Status: DC
  Administered 2021-01-05: 20 mg via ORAL
  Filled 2021-01-04: qty 1

## 2021-01-04 MED ORDER — METHOCARBAMOL 500 MG IVPB - SIMPLE MED
500.0000 mg | Freq: Four times a day (QID) | INTRAVENOUS | Status: DC | PRN
  Filled 2021-01-04: qty 50

## 2021-01-04 MED ORDER — INSULIN ASPART 100 UNIT/ML IJ SOLN: 0.0000 [IU] | Freq: Every day | INTRAMUSCULAR | Status: DC

## 2021-01-04 MED ORDER — DEXAMETHASONE SODIUM PHOSPHATE 10 MG/ML IJ SOLN
INTRAMUSCULAR | Status: AC
  Filled 2021-01-04: qty 1

## 2021-01-04 MED ORDER — ROPIVACAINE HCL 5 MG/ML IJ SOLN
INTRAMUSCULAR | Status: DC | PRN
  Administered 2021-01-04: 25 mg via PERINEURAL
  Administered 2021-01-04: 15 mg via PERINEURAL

## 2021-01-04 MED ORDER — DIPHENHYDRAMINE HCL 12.5 MG/5ML PO ELIX: 12.5000 mg | ORAL_SOLUTION | ORAL | Status: DC | PRN

## 2021-01-04 MED ORDER — INSULIN ASPART 100 UNIT/ML IJ SOLN: 0.0000 [IU] | Freq: Three times a day (TID) | INTRAMUSCULAR | Status: DC

## 2021-01-04 MED ORDER — PROPOFOL 10 MG/ML IV BOLUS
INTRAVENOUS | Status: AC
  Filled 2021-01-04: qty 20

## 2021-01-04 MED ORDER — AMISULPRIDE (ANTIEMETIC) 5 MG/2ML IV SOLN: 10.0000 mg | Freq: Once | INTRAVENOUS | Status: DC | PRN

## 2021-01-04 SURGICAL SUPPLY — 64 items
BIT DRILL 1.9 AO (BIT) ×1
BIT DRILL 1.9MM AO (BIT) ×1 IMPLANT
BIT DRILL 135X2XAO FIT SCL (BIT) ×1 IMPLANT
BIT DRILL 2.0 (BIT) ×2
BIT DRILL 2.6 (BIT) ×2
BIT DRILL 2.6X VARIAX 2 (BIT) ×1 IMPLANT
BIT DRILL CANN 2.7 (BIT) ×2
BIT DRILL SRG 2.7XCANN AO CPLG (BIT) ×1 IMPLANT
BIT DRL 135X2XAO FIT SCL (BIT) ×1
BIT DRL 2.6X VARIAX 2 (BIT) ×1
BIT DRL SRG 2.7XCANN AO CPLNG (BIT) ×1
BNDG CMPR MED 10X6 ELC LF (GAUZE/BANDAGES/DRESSINGS) ×1
BNDG ELASTIC 6X10 VLCR STRL LF (GAUZE/BANDAGES/DRESSINGS) ×2 IMPLANT
CLOTH BEACON ORANGE TIMEOUT ST (SAFETY) ×2 IMPLANT
COVER SURGICAL LIGHT HANDLE (MISCELLANEOUS) ×2 IMPLANT
COVER WAND RF STERILE (DRAPES) IMPLANT
CUFF TOURN SGL QUICK 34 (TOURNIQUET CUFF) ×2
CUFF TRNQT CYL 34X4.125X (TOURNIQUET CUFF) ×1 IMPLANT
DECANTER SPIKE VIAL GLASS SM (MISCELLANEOUS) ×2 IMPLANT
DRAPE C-ARM 42X120 X-RAY (DRAPES) ×2 IMPLANT
DRAPE C-ARMOR (DRAPES) ×2 IMPLANT
DRAPE U-SHAPE 47X51 STRL (DRAPES) ×2 IMPLANT
DRILL BIT 1.9MM AO (BIT) ×2
DRSG ADAPTIC 3X8 NADH LF (GAUZE/BANDAGES/DRESSINGS) ×2 IMPLANT
DRSG PAD ABDOMINAL 8X10 ST (GAUZE/BANDAGES/DRESSINGS) ×2 IMPLANT
DURAPREP 26ML APPLICATOR (WOUND CARE) ×2 IMPLANT
ELECT REM PT RETURN 15FT ADLT (MISCELLANEOUS) ×2 IMPLANT
GAUZE SPONGE 4X4 12PLY STRL (GAUZE/BANDAGES/DRESSINGS) IMPLANT
GLOVE SRG 8 PF TXTR STRL LF DI (GLOVE) ×2 IMPLANT
GLOVE SURG ENC MOIS LTX SZ7.5 (GLOVE) ×2 IMPLANT
GLOVE SURG LTX SZ8 (GLOVE) ×2 IMPLANT
GLOVE SURG UNDER POLY LF SZ8 (GLOVE) ×4
GOWN STRL REUS W/TWL XL LVL3 (GOWN DISPOSABLE) ×4 IMPLANT
K-WIRE 1.6X150 (WIRE) ×6
K-WIRE FX150X1.6XKRSH (WIRE) ×3
K-WIRE ORTHOPEDIC 1.4X150L (WIRE) ×4
KIT BASIN OR (CUSTOM PROCEDURE TRAY) ×2 IMPLANT
KIT TURNOVER KIT A (KITS) ×2 IMPLANT
KWIRE FX150X1.6XKRSH (WIRE) ×3 IMPLANT
KWIRE ORTHOPEDIC 1.4X150L (WIRE) ×2 IMPLANT
PACK ORTHO EXTREMITY (CUSTOM PROCEDURE TRAY) ×2 IMPLANT
PAD CAST 4YDX4 CTTN HI CHSV (CAST SUPPLIES) IMPLANT
PADDING CAST COTTON 4X4 STRL (CAST SUPPLIES)
PENCIL SMOKE EVACUATOR (MISCELLANEOUS) IMPLANT
PLATE DISTAL FIBULA 3HOLE (Plate) ×2 IMPLANT
PROTECTOR NERVE ULNAR (MISCELLANEOUS) ×2 IMPLANT
SCREW BN T10 FT 14X3.5XSTRDR (Screw) ×2 IMPLANT
SCREW BONE 3.5X14MM (Screw) ×4 IMPLANT
SCREW BONE THRD T10 3.5X12 (Screw) ×2 IMPLANT
SCREW CANNULATED 46X4.0MM (Screw) ×4 IMPLANT
SCREW LOCK FT 2.7X12 (Screw) ×2 IMPLANT
SCREW LOCK FT 2.7X14 (Screw) ×2 IMPLANT
SCREW LOCK FT 2.7X16 (Screw) ×2 IMPLANT
SCREW LOCK FT ST 2.3X18 (Screw) ×2 IMPLANT
SCREW LOCK FT ST 2.3X20 (Screw) ×2 IMPLANT
SUT ETHILON 2 0 PS N (SUTURE) ×4 IMPLANT
SUT VIC AB 0 CT1 36 (SUTURE) ×2 IMPLANT
SUT VIC AB 1 CT1 27 (SUTURE) ×2
SUT VIC AB 1 CT1 27XBRD ANBCTR (SUTURE) ×1 IMPLANT
SUT VIC AB 2-0 CT1 27 (SUTURE) ×4
SUT VIC AB 2-0 CT1 TAPERPNT 27 (SUTURE) ×2 IMPLANT
SYR CONTROL 10ML LL (SYRINGE) ×2 IMPLANT
TOWEL OR 17X26 10 PK STRL BLUE (TOWEL DISPOSABLE) ×4 IMPLANT
TOWEL OR NON WOVEN STRL DISP B (DISPOSABLE) ×2 IMPLANT

## 2021-01-04 NOTE — Interval H&P Note (Signed)
History and Physical Interval Note: The patient understands that she is here today for surgical fixation of her left unstable bimalleolar ankle fracture.  There is no known interval change in her medical status or acute changes.  See recent H&P.  She understands why we have recommended surgery.  I discussed the risk and benefits involved and informed that is obtained.  01/04/2021 1:45 PM  Amanda Frederick  has presented today for surgery, with the diagnosis of Left Bimalleolar Ankle Fracture.  The various methods of treatment have been discussed with the patient and family. After consideration of risks, benefits and other options for treatment, the patient has consented to  Procedure(s): OPEN REDUCTION INTERNAL FIXATION (ORIF) LEFT BIMALLEOLAR ANKLE FRACTURE (Left) as a surgical intervention.  The patient's history has been reviewed, patient examined, no change in status, stable for surgery.  I have reviewed the patient's chart and labs.  Questions were answered to the patient's satisfaction.     Kathryne Hitch

## 2021-01-04 NOTE — Anesthesia Postprocedure Evaluation (Signed)
Anesthesia Post Note  Patient: Mykaylah Ballman  Procedure(s) Performed: OPEN REDUCTION INTERNAL FIXATION (ORIF) LEFT BIMALLEOLAR ANKLE FRACTURE (Left: Ankle)     Patient location during evaluation: PACU Anesthesia Type: Regional Level of consciousness: awake and alert Pain management: pain level controlled Vital Signs Assessment: post-procedure vital signs reviewed and stable Respiratory status: spontaneous breathing and respiratory function stable Cardiovascular status: stable Postop Assessment: no apparent nausea or vomiting Anesthetic complications: no   No notable events documented.  Last Vitals:  Vitals:   01/04/21 2115 01/04/21 2125  BP: (!) 132/98 (!) 141/97  Pulse: 98 99  Resp: (!) 26 19  Temp: 36.4 C 37.1 C  SpO2: 96% 96%    Last Pain:  Vitals:   01/04/21 2125  TempSrc: Oral  PainSc:                  Nara Paternoster DANIEL

## 2021-01-04 NOTE — Addendum Note (Signed)
Addendum  created 01/04/21 2334 by Sudie Grumbling, CRNA   Charge Capture section accepted, Visit diagnoses modified

## 2021-01-04 NOTE — Transfer of Care (Signed)
Immediate Anesthesia Transfer of Care Note  Patient: Amanda Frederick  Procedure(s) Performed: OPEN REDUCTION INTERNAL FIXATION (ORIF) LEFT BIMALLEOLAR ANKLE FRACTURE (Left: Ankle)  Patient Location: PACU  Anesthesia Type:MAC  Level of Consciousness: awake, alert  and oriented  Airway & Oxygen Therapy: Patient Spontanous Breathing and Patient connected to face mask  Post-op Assessment: Report given to RN and Post -op Vital signs reviewed and stable  Post vital signs: Reviewed and stable  Last Vitals:  Vitals Value Taken Time  BP 107/72 01/04/21 2032  Temp 35.3 C 01/04/21 2029  Pulse 84 01/04/21 2033  Resp 19 01/04/21 2033  SpO2 98 % 01/04/21 2033  Vitals shown include unvalidated device data.  Last Pain:  Vitals:   01/04/21 1249  TempSrc:   PainSc: 5       Patients Stated Pain Goal: 3 (67/54/49 2010)  Complications: No notable events documented.

## 2021-01-04 NOTE — Progress Notes (Signed)
Hypoglycemic Event  CBG: 61  Treatment: D50 25 mL (12.5 gm)  Symptoms: Pale, Sweaty, and Nervous/irritable  Follow-up CBG: Time:1616 CBG Result:148  Possible Reasons for Event: Other: NPO for surgery  Comments/MD notified:Dr Krista Blue made aware.    Beverely Low

## 2021-01-04 NOTE — Progress Notes (Signed)
Assisted Dr. Krista Blue with left, ultrasound guided, ankle block. Side rails up, monitors on throughout procedure. See vital signs in flow sheet. Tolerated Procedure well.

## 2021-01-04 NOTE — Anesthesia Procedure Notes (Signed)
Anesthesia Regional Block: Popliteal block   Pre-Anesthetic Checklist: , timeout performed,  Correct Patient, Correct Site, Correct Laterality,  Correct Procedure, Correct Position, site marked,  Risks and benefits discussed,  Surgical consent,  Pre-op evaluation,  At surgeon's request and post-op pain management  Laterality: Left  Prep: chloraprep       Needles:  Injection technique: Single-shot  Needle Type: Echogenic Stimulator Needle          Additional Needles:   Narrative:  Start time: 01/04/2021 5:13 PM End time: 01/04/2021 5:23 PM Injection made incrementally with aspirations every 5 mL.  Performed by: Personally  Anesthesiologist: Heather Roberts, MD  Additional Notes: A functioning IV was confirmed and monitors were applied.  Sterile prep and drape, hand hygiene and sterile gloves were used.  Negative aspiration and test dose prior to incremental administration of local anesthetic. The patient tolerated the procedure well.Ultrasound  guidance: relevant anatomy identified, needle position confirmed, local anesthetic spread visualized around nerve(s), vascular puncture avoided.  Image printed for medical record. ACB supplementation added.

## 2021-01-04 NOTE — Progress Notes (Signed)
Notified patient's husband of delay in surgery. He verbalizes understanding.

## 2021-01-04 NOTE — Brief Op Note (Signed)
01/04/2021  8:04 PM  PATIENT:  Amanda Frederick  50 y.o. female  PRE-OPERATIVE DIAGNOSIS:  Left Bimalleolar Ankle Fracture  POST-OPERATIVE DIAGNOSIS:  left bimalleolar ankle fracture  PROCEDURE:  Procedure(s): OPEN REDUCTION INTERNAL FIXATION (ORIF) LEFT BIMALLEOLAR ANKLE FRACTURE (Left)  SURGEON:  Surgeon(s) and Role:    Mcarthur Rossetti, MD - Primary  PHYSICIAN ASSISTANT: Benita Stabile, PA-C  ANESTHESIA:   regional, IV sedation, and MAC  EBL:  25 mL   COUNTS:  YES  TOURNIQUET:  * Missing tourniquet times found for documented tourniquets in log: [id number] *  PLAN OF CARE: Admit for overnight observation  PATIENT DISPOSITION:  PACU - hemodynamically stable.   Delay start of Pharmacological VTE agent (>24hrs) due to surgical blood loss or risk of bleeding: no

## 2021-01-05 DIAGNOSIS — S82842A Displaced bimalleolar fracture of left lower leg, initial encounter for closed fracture: Secondary | ICD-10-CM | POA: Diagnosis not present

## 2021-01-05 LAB — HEMOGLOBIN A1C
Hgb A1c MFr Bld: 7.5 % — ABNORMAL HIGH (ref 4.8–5.6)
Mean Plasma Glucose: 169 mg/dL

## 2021-01-05 LAB — GLUCOSE, CAPILLARY
Glucose-Capillary: 215 mg/dL — ABNORMAL HIGH (ref 70–99)
Glucose-Capillary: 294 mg/dL — ABNORMAL HIGH (ref 70–99)

## 2021-01-05 MED ORDER — TIZANIDINE HCL 4 MG PO TABS: 4.0000 mg | ORAL_TABLET | Freq: Three times a day (TID) | ORAL | 0 refills | Status: DC | PRN

## 2021-01-05 MED ORDER — OXYCODONE HCL 5 MG PO TABS: 5.0000 mg | ORAL_TABLET | Freq: Four times a day (QID) | ORAL | 0 refills | Status: DC | PRN

## 2021-01-05 NOTE — Discharge Summary (Signed)
Patient ID: Amanda Frederick MRN: [id number] DOB/AGE: 12-19-1970 50 y.o.  Admit date: 01/04/2021 Discharge date: 01/05/2021  Admission Diagnoses:  Principal Problem:   Displaced bimalleolar fracture of left lower leg, initial encounter for closed fracture Active Problems:   Ankle fracture, bimalleolar, closed, left, initial encounter   Discharge Diagnoses:  Same  Past Medical History:  Diagnosis Date   AC (acromioclavicular) joint bone spurs    Anemia    Anxiety    Arthritis    Bronchitis    Bulging lumbar disc    Bursitis    Complication of anesthesia    "has died 3 separate times under anesthesia"   Depression    Diabetes (HCC)    Environmental allergies    Fibromyalgia    High blood pressure    High cholesterol    History of COVID-19 01/2020   was hospitalized   History of endometriosis    History of uterine fibroid    Migraines    Neuropathy    eyes , hands and feet    Pinched nerve    Pneumonia    Seizure (HCC)    after eye test only   Spondylosis    Tachycardia    after COVID   Tendonitis    Vision abnormalities     Surgeries: Procedure(s): OPEN REDUCTION INTERNAL FIXATION (ORIF) LEFT BIMALLEOLAR ANKLE FRACTURE on 01/04/2021   Consultants:   Discharged Condition: Improved  Hospital Course: Amanda Frederick is an 50 y.o. female who was admitted 01/04/2021 for operative treatment ofDisplaced bimalleolar fracture of left lower leg, initial encounter for closed fracture. Patient has severe unremitting pain that affects sleep, daily activities, and work/hobbies. After pre-op clearance the patient was taken to the operating room on 01/04/2021 and underwent  Procedure(s): OPEN REDUCTION INTERNAL FIXATION (ORIF) LEFT BIMALLEOLAR ANKLE FRACTURE.    Patient was given perioperative antibiotics:  Anti-infectives (From admission, onward)    Start     Dose/Rate Route Frequency Ordered Stop   01/05/21 0030  ceFAZolin (ANCEF) IVPB 1 g/50 mL premix        1 g 100 mL/hr  over 30 Minutes Intravenous Every 6 hours 01/04/21 2141 01/05/21 1829   01/04/21 1415  ceFAZolin (ANCEF) IVPB 2g/100 mL premix        2 g 200 mL/hr over 30 Minutes Intravenous  Once 01/04/21 1404 01/04/21 1833        Patient was given sequential compression devices, early ambulation, and chemoprophylaxis to prevent DVT.  Patient benefited maximally from hospital stay and there were no complications.    Recent vital signs: Patient Vitals for the past 24 hrs:  BP Temp Temp src Pulse Resp SpO2 Height Weight  01/05/21 0936 (!) 151/96 99.1 F (37.3 C) Oral (!) 110 16 -- -- --  01/05/21 0648 133/85 98.5 F (36.9 C) Oral (!) 110 20 96 % -- --  01/05/21 0159 (!) 133/95 97.9 F (36.6 C) Oral (!) 101 20 95 % -- --  01/04/21 2329 (!) 129/95 (!) 97.5 F (36.4 C) Oral (!) 102 20 95 % -- --  01/04/21 2125 (!) 141/97 98.7 F (37.1 C) Oral 99 19 96 % -- --  01/04/21 2115 (!) 132/98 97.6 F (36.4 C) -- 98 (!) 26 96 % -- --  01/04/21 2100 103/70 -- -- 88 20 97 % -- --  01/04/21 2045 100/67 -- -- 87 (!) 21 97 % -- --  01/04/21 2043 103/69 -- -- 83 (!) 37 98 % -- --  01/04/21 2039 Marland Kitchen)  87/63 -- -- 85 (!) 28 97 % -- --  01/04/21 2033 107/72 -- -- 84 (!) 22 98 % -- --  01/04/21 2030 (!) 82/54 -- -- 86 (!) 23 95 % -- --  01/04/21 2029 (!) 84/56 (!) 95.5 F (35.3 C) -- 86 16 96 % -- --  01/04/21 1826 (!) 140/98 -- -- (!) 102 (!) 21 98 % -- --  01/04/21 1821 135/88 -- -- (!) 102 17 98 % -- --  01/04/21 1816 (!) 132/96 -- -- (!) 102 (!) 22 99 % -- --  01/04/21 1811 (!) 127/95 -- -- (!) 101 19 98 % -- --  01/04/21 1806 (!) 130/93 -- -- (!) 101 (!) 22 99 % -- --  01/04/21 1801 (!) 136/99 -- -- (!) 102 (!) 21 99 % -- --  01/04/21 1756 (!) 130/96 -- -- (!) 102 20 98 % -- --  01/04/21 1751 (!) 128/96 -- -- (!) 102 (!) 22 98 % -- --  01/04/21 1746 121/90 -- -- (!) 101 (!) 21 98 % -- --  01/04/21 1741 (!) 126/96 -- -- (!) 102 (!) 21 97 % -- --  01/04/21 1736 126/87 -- -- (!) 109 (!) 27 98 % -- --   01/04/21 1731 (!) 150/102 -- -- (!) 113 (!) 22 96 % -- --  01/04/21 1726 (!) 147/102 -- -- (!) 118 17 96 % -- --  01/04/21 1721 (!) 153/108 -- -- (!) 115 (!) 28 97 % -- --  01/04/21 1716 (!) 148/111 -- -- (!) 116 (!) 28 95 % -- --  01/04/21 1711 (!) 168/109 -- -- (!) 118 (!) 28 95 % -- --  01/04/21 1706 (!) 157/111 -- -- (!) 112 (!) 25 97 % -- --  01/04/21 1636 (!) 160/104 -- -- (!) 115 (!) 25 97 % -- --  01/04/21 1356 -- -- -- (!) 106 18 96 % -- --  01/04/21 1353 -- -- -- (!) 108 (!) 23 97 % -- --  01/04/21 1352 (!) 147/106 -- -- (!) 106 16 96 % -- --  01/04/21 1249 -- -- -- -- -- -- 5\' 6"  (1.676 m) 77 kg  01/04/21 1246 (!) 160/112 98.4 F (36.9 C) Oral (!) 111 18 95 % -- --     Recent laboratory studies:  Recent Labs    01/04/21 1304  WBC 6.7  HGB 13.5  HCT 41.0  PLT 305  NA 139  K 4.5  CL 109  CO2 21*  BUN 15  CREATININE 0.70  GLUCOSE 116*  CALCIUM 8.8*     Discharge Medications:   Allergies as of 01/05/2021       Reactions   Other Hives   Antibiotic specific one unsure but has tolerated antibiotic recently. Reaction was about 20 years ago.        Medication List     STOP taking these medications    methocarbamol 500 MG tablet Commonly known as: ROBAXIN   oxyCODONE-acetaminophen 5-325 MG tablet Commonly known as: PERCOCET/ROXICET       TAKE these medications    albuterol 108 (90 Base) MCG/ACT inhaler Commonly known as: VENTOLIN HFA Inhale 1 puff into the lungs every 6 (six) hours as needed for wheezing or shortness of breath.   cholecalciferol 1000 units tablet Commonly known as: VITAMIN D Take 1,000 Units by mouth daily.   fish oil-omega-3 fatty acids 1000 MG capsule Take 2 g by mouth 2 (two) times daily.   insulin aspart  100 UNIT/ML injection Commonly known as: NovoLOG For use in pump, total of 120 units per day   lisinopril 20 MG tablet Commonly known as: ZESTRIL Take 20 mg by mouth daily.   oxyCODONE 5 MG immediate release  tablet Commonly known as: Oxy IR/ROXICODONE Take 1-2 tablets (5-10 mg total) by mouth every 6 (six) hours as needed for moderate pain (pain score 4-6).   pantoprazole 40 MG tablet Commonly known as: Protonix Take 1 tablet (40 mg total) by mouth daily for 30 doses.   Paradigm Quick-set 32" 34mm Misc 1 Device by Does not apply route continuous. Vovolog   tiZANidine 4 MG tablet Commonly known as: Zanaflex Take 1 tablet (4 mg total) by mouth every 8 (eight) hours as needed for muscle spasms.       ASK your doctor about these medications    glucose blood test strip Commonly known as: Banker Next Test Test 8-10 times per day 8-10 lancets/day               Durable Medical Equipment  (From admission, onward)           Start     Ordered   01/04/21 2141  DME 3 n 1  Once        01/04/21 2141   01/04/21 2141  DME Walker rolling  Once       Question Answer Comment  Walker: With 5 Inch Wheels   Patient needs a walker to treat with the following condition Ankle fracture, bimalleolar, closed, left, initial encounter      01/04/21 2141            Diagnostic Studies: DG Tibia/Fibula Left  Result Date: 12/25/2020 CLINICAL DATA:  Fall EXAM: LEFT TIBIA AND FIBULA - 2 VIEW COMPARISON:  None. FINDINGS: Acute mildly displaced medial and lateral malleolar fractures. Slight widening of the anterior joint space. Proximal tibia and fibula appear intact. IMPRESSION: Acute mildly displaced medial and lateral malleolar fractures. Electronically Signed   By: Jasmine Pang M.D.   On: 12/25/2020 22:11   DG Ankle Complete Left  Result Date: 01/04/2021 CLINICAL DATA:  ORIF left ankle fracture EXAM: LEFT ANKLE COMPLETE - 3+ VIEW COMPARISON:  12/25/2020 FINDINGS: Plate and screw fixation in the distal fibula and screw fixation across the medial malleolar fracture. Anatomic alignment. No hardware bony complicating feature. IMPRESSION: Internal fixation across the medial malleolar and  distal fibular fractures. No visible complicating feature. Electronically Signed   By: Charlett Nose M.D.   On: 01/04/2021 20:21   DG Ankle Complete Left  Result Date: 12/25/2020 CLINICAL DATA:  Fall EXAM: LEFT ANKLE COMPLETE - 3+ VIEW COMPARISON:  None. FINDINGS: Acute mildly displaced medial malleolar fracture. Acute distal fibular fracture with about 1/3 shaft diameter lateral displacement of distal fracture fragment. Possible posterior malleolar fracture of the tibia. Ankle mortise grossly symmetric on mortise view. Diffuse soft tissue swelling. No dislocation evident IMPRESSION: Acute mildly displaced bimalleolar fracture with possible mild fracture deformity of the posterior malleolus. Electronically Signed   By: Jasmine Pang M.D.   On: 12/25/2020 22:10   CT Cervical Spine Wo Contrast  Result Date: 12/26/2020 CLINICAL DATA:  Fall with ankle deformity EXAM: CT CERVICAL SPINE WITHOUT CONTRAST TECHNIQUE: Multidetector CT imaging of the cervical spine was performed without intravenous contrast. Multiplanar CT image reconstructions were also generated. COMPARISON:  MRI 11/24/2008 FINDINGS: Alignment: Straightening of the cervical spine. No subluxation. Facet alignment within normal limits Skull base and vertebrae: No acute fracture. No  primary bone lesion or focal pathologic process. Soft tissues and spinal canal: No prevertebral fluid or swelling. No visible canal hematoma. Disc levels: Multilevel degenerative change with advanced disease at C5-C6 and C6-C7. Facet degenerative changes at multiple levels with multiple level foraminal stenosis. Upper chest: Negative. Other: None IMPRESSION: Straightening of the cervical spine with degenerative changes. No definite acute osseous abnormality Electronically Signed   By: Jasmine Pang M.D.   On: 12/26/2020 00:16   DG C-Arm 1-60 Min-No Report  Result Date: 01/04/2021 Fluoroscopy was utilized by the requesting physician.  No radiographic interpretation.     Disposition: Discharge disposition: 01-Home or Self Care          Follow-up Information     Kathryne Hitch, MD. Schedule an appointment as soon as possible for a visit in 2 week(s).   Specialty: Orthopedic Surgery Contact information: [address] [place] [address] [phone]                  Signed: Kathryne Hitch 01/05/2021, 10:46 AM

## 2021-01-05 NOTE — Op Note (Signed)
NAMEYARITZI, CRAUN MEDICAL RECORD NO: [id number] ACCOUNT NO: [id number] DATE OF BIRTH: 01-05-1971 FACILITY: [facility] LOCATION: [id number] PHYSICIAN: Vanita Panda. Magnus Ivan, MD  Operative Report   DATE OF PROCEDURE: 01/04/2021   PREOPERATIVE DIAGNOSIS:  Left ankle unstable bimalleolar fracture.  POSTOPERATIVE DIAGNOSIS:  Left ankle unstable bimalleolar fracture.  PROCEDURE:  Open reduction and internal fixation of unstable left bimalleolar ankle fracture with fixation of the lateral and medial malleolus.  IMPLANTS:  [employer]ryker VariAx 6-hole distal fibular periarticular plate and 2 partially threaded cancellous screws medially.  SURGEON:  Dr. Vanita Panda. Magnus Ivan.  ASSISTANT:  Richardean Canal, PA-C.  ANESTHESIA:   1.  Left lower extremity regional block. 2.  Mask ventilation, IV sedation.  TOURNIQUET TIME:  Less than an hour and a half.  COMPLICATIONS:  None.  ANTIBIOTICS:  2 grams IV Ancef.  INDICATIONS:  The patient is a 50 year old female diabetic who sustained an unfortunate significant mechanical fall last week.  She sustained a bimalleolar fracture, it was quite unstable.  She was placed appropriately in a splint and given time to  further ankle swelling to subside.  She is now presenting for definitive fixation of this unstable left ankle fracture.  I had a long and thorough discussion about the rationale behind having surgery for this ankle and she agrees to proceed given the  nature of the fracture.  DESCRIPTION OF PROCEDURE:  After informed consent was obtained, and appropriate left ankle was marked, anesthesia obtained regional block of the left lower extremity in the holding room.  She was then brought to the operating room and placed supine on  the operating table.  Mask ventilation, IV sedation was obtained, a nonsterile tourniquet was placed around her upper left thigh.  Her left knee, leg, ankle and foot were prepped and draped with DuraPrep and sterile drapes.  A timeout  was called.  She  was identified, correct patient, correct left ankle.  We then used an Esmarch to wrap that leg and tourniquet was inflated to 250 mm of pressure.  I then made a lateral incision over the ankle and carried this proximally and distally.  This is over the  lateral malleolus.  I dissected down to the fracture site.  We found a highly comminuted distal fibular fracture at the level of the ankle mortise.  We were able to use reduction forceps and temporary K-wires to hold this in place and get the fibula out  to length.  We then chose our periarticular fibular locking plate and secured this with bicortical screws proximally and locking screws distally.  We had to put a small screw in the anterior aspect of the fibula due to a large piece.  We then made an  incision over the medial malleolus and we were able to dissect down to the medial malleolus piece.  We reduced it.  Before reducing it we then irrigated out the ankle joint.  Once we felt we had anatomic reduction, we placed two temporary guide pins and  then overdrilled these for placing two 4.0 partially threaded screws measuring 46 mm in length.  This did hold the medial malleolus in place, congruently reduced and in good position.  We stressed the ankle mortise and the syndesmosis and it was stable  afterwards.  We then irrigated all wounds with normal saline solution.  We closed deep tissue with #1 Vicryl over the plate followed by 0 Vicryl in the deep tissue on both incisions, 2-0 Vicryl subcutaneous tissue and interrupted 2-0 nylon in the  skin to  close both incisions.  We decided to put her in a well-padded plaster splint, given the soft nature of her bone and the comminuted nature of her fracture.  The tourniquet was let down.  Toes pinked nicely.  She was taken to recovery room in stable  condition with all final counts being correct.  No complications noted.  Postoperatively, we will admit her overnight for pain control and IV  antibiotics with also getting up with therapy tomorrow and hopefully discharge tomorrow.  Of note, Rexene Edison, PA-C assisted the entire case.  His assistance was crucial for facilitating all aspects of this case.   SUJ D: 01/04/2021 8:03:11 pm T: 01/05/2021 12:02:00 am  JOB: [id number]/ [id number]

## 2021-01-05 NOTE — Plan of Care (Signed)
  Problem: Pain Managment: Goal: General experience of comfort will improve Outcome: Progressing   Problem: Safety: Goal: Ability to remain free from injury will improve Outcome: Progressing   

## 2021-01-05 NOTE — Evaluation (Signed)
Physical Therapy Evaluation Patient Details Name: Amanda Frederick MRN: [id number] DOB: 1971/01/02 Today's Date: 01/05/2021   History of Present Illness  50 year old female who unfortunately sustained a significant mechanical fall recently and was seen in the emergency room.  She was found to have an unstable bimalleolar ankle fracture. Open reduction and internal fixation of unstable left bimalleolar ankle fracture with fixation of the lateral and medial malleolus. PMH: DM, anxiety, anemia   Clinical Impression  Patient evaluated by Physical Therapy with no further acute PT needs identified. All education has been completed and the patient has no further questions.  Pt able to transfer with min/guard assist and maintain NWB with cues. Feel pt could benefit from HHPT. Discussed possibility of using knee scooter--recommend  practice with HHPT, husband states he can access a knee scooter.  Pt having difficulty with grasping RW with R hand d/t pain and swelling R UE at this time.Ok to d/c with family assist from PT standpoint once DME arrives  See below for any follow-up Physical Therapy or equipment needs. PT is signing off. Thank you for this referral.     Follow Up Recommendations Home health PT    Equipment Recommendations  Rolling walker with 5" wheels;3in1 (PT)    Recommendations for Other Services       Precautions / Restrictions Precautions Precautions: Fall Restrictions LLE Weight Bearing: Non weight bearing      Mobility  Bed Mobility Overal bed mobility: Needs Assistance Bed Mobility: Supine to Sit     Supine to sit: Min assist     General bed mobility comments: assist with LLE    Transfers Overall transfer level: Needs assistance Equipment used: Rolling walker (2 wheeled) Transfers: Sit to/from [facility] Sit to Stand: Min guard;Min assist Stand pivot transfers: Min guard;Min assist       General transfer comment: light assist to rise on  initial stand. repeated for activity tol and instruction. cues for hand placement and NWB. pt able to maintain NWB with cues  Ambulation/Gait                Stairs            Wheelchair Mobility    Modified Rankin (Stroke Patients Only)       Balance Overall balance assessment: Needs assistance;History of Falls   Sitting balance-Leahy Scale: Good       Standing balance-Leahy Scale: Poor Standing balance comment: reliant on at least unilateral UE support to maintain static stand and keep NWB LLE                             Pertinent Vitals/Pain Pain Assessment: No/denies pain    Home Living Family/patient expects to be discharged to:: Private residence Living Arrangements: Spouse/significant other Available Help at Discharge: Family Type of Home: House Home Access: Ramped entrance     Home Layout: One level Home Equipment: Crutches;Wheelchair - manual Additional Comments: husband states he can take a few more days off to assist pt as needed.    Prior Function Level of Independence: Independent               Hand Dominance        Extremity/Trunk Assessment   Upper Extremity Assessment Upper Extremity Assessment: RUE deficits/detail;LUE deficits/detail RUE Deficits / Details: edema d/t IV infiltrate. difficulty with AROM d/t pain LUE Deficits / Details: grossly WFL    Lower Extremity Assessment Lower Extremity Assessment: LLE  deficits/detail LLE Deficits / Details: nerve block still in effect, sensation absent to light touch over toes, unable to move toes LLE: Unable to fully assess due to immobilization       Communication   Communication: No difficulties  Cognition Arousal/Alertness: Awake/alert Behavior During Therapy: WFL for tasks assessed/performed Overall Cognitive Status: Within Functional Limits for tasks assessed                                        General Comments      Exercises      Assessment/Plan    PT Assessment All further PT needs can be met in the next venue of care  PT Problem List         PT Treatment Interventions      PT Goals (Current goals can be found in the Care Plan section)  Acute Rehab PT Goals Patient Stated Goal: home today PT Goal Formulation: All assessment and education complete, DC therapy    Frequency     Barriers to discharge        Co-evaluation               AM-PAC PT "6 Clicks" Mobility  Outcome Measure Help needed turning from your back to your side while in a flat bed without using bedrails?: A Little Help needed moving from lying on your back to sitting on the side of a flat bed without using bedrails?: A Little   Help needed standing up from a chair using your arms (e.g., wheelchair or bedside chair)?: A Little Help needed to walk in hospital room?: A Lot Help needed climbing 3-5 steps with a railing? : Total 6 Click Score: 12    End of Session Equipment Utilized During Treatment: Gait belt Activity Tolerance: Patient tolerated treatment well Patient left: with call bell/phone within reach;in chair;with family/visitor present   PT Visit Diagnosis: Other abnormalities of gait and mobility (R26.89)    Time: 1430-1453 PT Time Calculation (min) (ACUTE ONLY): 23 min   Charges:   PT Evaluation $PT Eval Low Complexity: 1 Low PT Treatments $Therapeutic Activity: 8-22 mins        Baxter Flattery, PT  Acute Rehab Dept ([place]) [phone] Pager [phone]  01/05/2021   [facility] 01/05/2021, 3:51 PM

## 2021-01-05 NOTE — TOC Transition Note (Signed)
Transition of Care [facility]) - CM/SW Discharge Note   Patient Details  Name: Amanda Frederick MRN: [id number] Date of Birth: October 12, 1970  Transition of Care [facility]) CM/SW Contact:  Golda Acre, RN Phone Number: 01/05/2021, 2:56 PM   Clinical Narrative:    Pt being dcd to home P.T. will be done through [facility]     Barriers to Discharge: No Barriers Identified   Patient Goals and CMS Choice        Discharge Placement                       Discharge Plan and Services   Discharge Planning Services: CM Consult            DME Arranged: Walker rolling, 3-N-1 DME Agency: [employer] Date DME Agency Contacted: 01/05/21 Time DME Agency Contacted: 1455 Representative spoke with at DME Agency: zack HH Arranged: PT HH Agency: [facility] Date [facility] Agency Contacted: 01/05/21 Time HH Agency Contacted: 1455 Representative spoke with at [facility] Agency: Engineering geologist  Social Determinants of Health (SDOH) Interventions     Readmission Risk Interventions No flowsheet data found.

## 2021-01-05 NOTE — Plan of Care (Signed)
Patient dc'd  

## 2021-01-05 NOTE — Discharge Instructions (Signed)
Remain nonweightbearing on your left ankle until your first follow-up appointment in 2 weeks. Elevation as needed for swelling. Keep your splint clean and dry.

## 2021-01-05 NOTE — Plan of Care (Signed)
  Problem: Education: Goal: Knowledge of General Education information will improve Description: Including pain rating scale, medication(s)/side effects and non-pharmacologic comfort measures Outcome: Progressing   Problem: Pain Managment: Goal: General experience of comfort will improve Outcome: Progressing   Problem: Safety: Goal: Ability to remain free from injury will improve Outcome: Progressing   

## 2021-01-05 NOTE — Progress Notes (Signed)
Subjective: 1 Day Post-Op Procedure(s) (LRB): OPEN REDUCTION INTERNAL FIXATION (ORIF) LEFT BIMALLEOLAR ANKLE FRACTURE (Left) Patient reports pain as moderate.  She states that her left ankle is doing okay but everything else besides the ankle is hurting her.  She still is motivated to want to go home later today.  Objective: Vital signs in last 24 hours: Temp:  [95.5 F (35.3 C)-99.1 F (37.3 C)] 99.1 F (37.3 C) (06/11 0936) Pulse Rate:  [83-118] 110 (06/11 0936) Resp:  [16-37] 16 (06/11 0936) BP: (82-168)/(54-112) 151/96 (06/11 0936) SpO2:  [95 %-99 %] 96 % (06/11 0648) Weight:  [77 kg] 77 kg (06/10 1249)  Intake/Output from previous day: 06/10 0701 - 06/11 0700 In: 1690 [P.O.:240; I.V.:1250; IV Piggyback:50] Out: 25 [Blood:25] Intake/Output this shift: Total I/O In: 360 [P.O.:360] Out: -   Recent Labs    01/04/21 1304  HGB 13.5   Recent Labs    01/04/21 1304  WBC 6.7  RBC 4.77  HCT 41.0  PLT 305   Recent Labs    01/04/21 1304  NA 139  K 4.5  CL 109  CO2 21*  BUN 15  CREATININE 0.70  GLUCOSE 116*  CALCIUM 8.8*   No results for input(s): LABPT, INR in the last 72 hours.  Compartment soft Her left lower extremity splint is clean, dry and intact. Her foot is well-perfused.  Assessment/Plan: 1 Day Post-Op Procedure(s) (LRB): OPEN REDUCTION INTERNAL FIXATION (ORIF) LEFT BIMALLEOLAR ANKLE FRACTURE (Left) Up with therapy -nonweightbearing on the left lower extremity. Discharge to home later this afternoon.      Kathryne Hitch 01/05/2021, 10:41 AM

## 2021-01-07 ENCOUNTER — Telehealth: Payer: Self-pay

## 2021-01-07 ENCOUNTER — Other Ambulatory Visit: Payer: Self-pay | Admitting: Orthopaedic Surgery

## 2021-01-07 MED ORDER — DOXYCYCLINE HYCLATE 100 MG PO TABS: 100.0000 mg | ORAL_TABLET | Freq: Two times a day (BID) | ORAL | 0 refills | Status: DC

## 2021-01-07 NOTE — Telephone Encounter (Signed)
Patients spouse Leory Plowman called he stated the patient was supposed to be prescribed a antibiotic but they didn't receive one,patients spouse is requesting a call back:[phone]

## 2021-01-07 NOTE — Telephone Encounter (Signed)
Husband aware this was sent in

## 2021-01-09 ENCOUNTER — Encounter (HOSPITAL_COMMUNITY): Payer: Self-pay | Admitting: Orthopaedic Surgery

## 2021-01-11 ENCOUNTER — Telehealth: Payer: Self-pay

## 2021-01-11 NOTE — Telephone Encounter (Signed)
Husband lvm on triage phone and  wanted to know if ace wrap can be rewrapped around the calf..says it is too tight. [phone]   I called back and advised that this was ok

## 2021-01-17 ENCOUNTER — Ambulatory Visit (INDEPENDENT_AMBULATORY_CARE_PROVIDER_SITE_OTHER): Payer: 59 | Admitting: Orthopaedic Surgery

## 2021-01-17 ENCOUNTER — Ambulatory Visit (INDEPENDENT_AMBULATORY_CARE_PROVIDER_SITE_OTHER): Payer: 59

## 2021-01-17 ENCOUNTER — Encounter: Payer: Self-pay | Admitting: Orthopaedic Surgery

## 2021-01-17 DIAGNOSIS — Z9889 Other specified postprocedural states: Secondary | ICD-10-CM | POA: Diagnosis not present

## 2021-01-17 DIAGNOSIS — Z8781 Personal history of (healed) traumatic fracture: Secondary | ICD-10-CM | POA: Diagnosis not present

## 2021-01-17 DIAGNOSIS — S82842A Displaced bimalleolar fracture of left lower leg, initial encounter for closed fracture: Secondary | ICD-10-CM

## 2021-01-17 MED ORDER — OXYCODONE HCL 5 MG PO TABS: 5.0000 mg | ORAL_TABLET | Freq: Four times a day (QID) | ORAL | 0 refills | Status: DC | PRN

## 2021-01-17 MED ORDER — GABAPENTIN 100 MG PO CAPS: 100.0000 mg | ORAL_CAPSULE | Freq: Three times a day (TID) | ORAL | 0 refills | Status: DC

## 2021-01-17 NOTE — Progress Notes (Signed)
The patient is 2 weeks tomorrow status post open reduction/internal fixation of a complex left ankle fracture.  We plated the lateral side and placed screws on the medial side.  She is a type I diabetic.  She does report her blood sugars have been under good control.  She is having appropriate pain postoperative but some burning as well.  Examination of her left operative ankle shows appropriate swelling.  There is no redness or drainage.  The incisions look good so remove the sutures in place Steri-Strips today and place her in a cam walking boot.  Her foot is well-perfused but I am concerned about showing some weakness in the foot with dorsiflexion.  She is having a burning sensation as well.  I want to have her work on passive motion of her ankle and her significant other knows how to do this demonstrating it.  I will refill her oxycodone which I think is appropriate and started on Neurontin 100 mg up to 3 times a day as needed for her burning pain and electrical shock type of pain.  We will see her back in 4 weeks with a repeat 3 views of the left ankle.  Hopefully by then we can initiate partial weightbearing.

## 2021-02-04 ENCOUNTER — Telehealth: Payer: Self-pay

## 2021-02-04 ENCOUNTER — Other Ambulatory Visit: Payer: Self-pay | Admitting: Orthopaedic Surgery

## 2021-02-04 MED ORDER — OXYCODONE HCL 5 MG PO TABS: 5.0000 mg | ORAL_TABLET | Freq: Four times a day (QID) | ORAL | 0 refills | Status: DC | PRN

## 2021-02-04 NOTE — Telephone Encounter (Signed)
Pt called and would like a refill on her oxycodone  

## 2021-02-04 NOTE — Telephone Encounter (Signed)
Please advise 

## 2021-02-07 ENCOUNTER — Telehealth: Payer: Self-pay | Admitting: Orthopaedic Surgery

## 2021-02-07 NOTE — Telephone Encounter (Signed)
Pt husband called and states that he would like to know if he could get a prescription for a scooter for his wife.   CB [phone]

## 2021-02-07 NOTE — Telephone Encounter (Signed)
Patient husband states he is actually going to just get one from [employer]

## 2021-02-14 ENCOUNTER — Encounter: Payer: Self-pay | Admitting: Orthopaedic Surgery

## 2021-02-14 ENCOUNTER — Emergency Department (HOSPITAL_COMMUNITY): Admit: 2021-02-14 | Discharge: 2021-02-14 | Disposition: A | Discharge status: Home / Self Care | Payer: 59

## 2021-02-14 ENCOUNTER — Encounter (HOSPITAL_COMMUNITY): Payer: Self-pay | Admitting: Pharmacy Technician

## 2021-02-14 ENCOUNTER — Inpatient Hospital Stay (HOSPITAL_COMMUNITY)
Admission: EM | Admit: 2021-02-14 | Discharge: 2021-02-21 | DRG: 246 | Disposition: A | Discharge status: Home Health | Payer: 59 | Attending: Internal Medicine | Admitting: Internal Medicine

## 2021-02-14 ENCOUNTER — Ambulatory Visit (INDEPENDENT_AMBULATORY_CARE_PROVIDER_SITE_OTHER): Payer: 59

## 2021-02-14 ENCOUNTER — Ambulatory Visit (INDEPENDENT_AMBULATORY_CARE_PROVIDER_SITE_OTHER): Payer: 59 | Admitting: Orthopaedic Surgery

## 2021-02-14 ENCOUNTER — Emergency Department (HOSPITAL_COMMUNITY): Payer: 59

## 2021-02-14 ENCOUNTER — Other Ambulatory Visit: Payer: Self-pay

## 2021-02-14 VITALS — Ht 66.0 in | Wt 169.0 lb

## 2021-02-14 DIAGNOSIS — IMO0002 Reserved for concepts with insufficient information to code with codable children: Secondary | ICD-10-CM

## 2021-02-14 DIAGNOSIS — I5022 Chronic systolic (congestive) heart failure: Secondary | ICD-10-CM | POA: Diagnosis present

## 2021-02-14 DIAGNOSIS — Z9851 Tubal ligation status: Secondary | ICD-10-CM

## 2021-02-14 DIAGNOSIS — Z8781 Personal history of (healed) traumatic fracture: Secondary | ICD-10-CM

## 2021-02-14 DIAGNOSIS — Z20822 Contact with and (suspected) exposure to covid-19: Secondary | ICD-10-CM | POA: Diagnosis present

## 2021-02-14 DIAGNOSIS — R0603 Acute respiratory distress: Secondary | ICD-10-CM

## 2021-02-14 DIAGNOSIS — F32A Depression, unspecified: Secondary | ICD-10-CM | POA: Diagnosis present

## 2021-02-14 DIAGNOSIS — I2510 Atherosclerotic heart disease of native coronary artery without angina pectoris: Secondary | ICD-10-CM | POA: Diagnosis present

## 2021-02-14 DIAGNOSIS — R0602 Shortness of breath: Secondary | ICD-10-CM

## 2021-02-14 DIAGNOSIS — I428 Other cardiomyopathies: Secondary | ICD-10-CM | POA: Diagnosis present

## 2021-02-14 DIAGNOSIS — I5041 Acute combined systolic (congestive) and diastolic (congestive) heart failure: Secondary | ICD-10-CM | POA: Diagnosis present

## 2021-02-14 DIAGNOSIS — Z8701 Personal history of pneumonia (recurrent): Secondary | ICD-10-CM

## 2021-02-14 DIAGNOSIS — I951 Orthostatic hypotension: Secondary | ICD-10-CM | POA: Diagnosis not present

## 2021-02-14 DIAGNOSIS — E109 Type 1 diabetes mellitus without complications: Secondary | ICD-10-CM | POA: Diagnosis present

## 2021-02-14 DIAGNOSIS — Z9889 Other specified postprocedural states: Secondary | ICD-10-CM

## 2021-02-14 DIAGNOSIS — Z8249 Family history of ischemic heart disease and other diseases of the circulatory system: Secondary | ICD-10-CM

## 2021-02-14 DIAGNOSIS — M797 Fibromyalgia: Secondary | ICD-10-CM | POA: Diagnosis present

## 2021-02-14 DIAGNOSIS — Z90710 Acquired absence of both cervix and uterus: Secondary | ICD-10-CM

## 2021-02-14 DIAGNOSIS — E1069 Type 1 diabetes mellitus with other specified complication: Secondary | ICD-10-CM | POA: Diagnosis present

## 2021-02-14 DIAGNOSIS — Z83438 Family history of other disorder of lipoprotein metabolism and other lipidemia: Secondary | ICD-10-CM

## 2021-02-14 DIAGNOSIS — Z8616 Personal history of COVID-19: Secondary | ICD-10-CM

## 2021-02-14 DIAGNOSIS — Z833 Family history of diabetes mellitus: Secondary | ICD-10-CM

## 2021-02-14 DIAGNOSIS — J90 Pleural effusion, not elsewhere classified: Secondary | ICD-10-CM

## 2021-02-14 DIAGNOSIS — I10 Essential (primary) hypertension: Secondary | ICD-10-CM | POA: Diagnosis present

## 2021-02-14 DIAGNOSIS — R911 Solitary pulmonary nodule: Secondary | ICD-10-CM | POA: Diagnosis present

## 2021-02-14 DIAGNOSIS — S82842A Displaced bimalleolar fracture of left lower leg, initial encounter for closed fracture: Secondary | ICD-10-CM

## 2021-02-14 DIAGNOSIS — E785 Hyperlipidemia, unspecified: Secondary | ICD-10-CM | POA: Diagnosis present

## 2021-02-14 DIAGNOSIS — E782 Mixed hyperlipidemia: Secondary | ICD-10-CM | POA: Diagnosis present

## 2021-02-14 DIAGNOSIS — I255 Ischemic cardiomyopathy: Secondary | ICD-10-CM | POA: Diagnosis present

## 2021-02-14 DIAGNOSIS — I110 Hypertensive heart disease with heart failure: Principal | ICD-10-CM | POA: Diagnosis present

## 2021-02-14 DIAGNOSIS — K219 Gastro-esophageal reflux disease without esophagitis: Secondary | ICD-10-CM | POA: Diagnosis present

## 2021-02-14 DIAGNOSIS — J918 Pleural effusion in other conditions classified elsewhere: Secondary | ICD-10-CM | POA: Diagnosis present

## 2021-02-14 DIAGNOSIS — Z955 Presence of coronary angioplasty implant and graft: Secondary | ICD-10-CM

## 2021-02-14 DIAGNOSIS — R000 Tachycardia, unspecified: Secondary | ICD-10-CM | POA: Diagnosis present

## 2021-02-14 DIAGNOSIS — Z794 Long term (current) use of insulin: Secondary | ICD-10-CM

## 2021-02-14 DIAGNOSIS — I5021 Acute systolic (congestive) heart failure: Secondary | ICD-10-CM | POA: Diagnosis present

## 2021-02-14 DIAGNOSIS — I509 Heart failure, unspecified: Secondary | ICD-10-CM

## 2021-02-14 DIAGNOSIS — J9601 Acute respiratory failure with hypoxia: Secondary | ICD-10-CM | POA: Diagnosis present

## 2021-02-14 DIAGNOSIS — F419 Anxiety disorder, unspecified: Secondary | ICD-10-CM | POA: Diagnosis present

## 2021-02-14 DIAGNOSIS — E1165 Type 2 diabetes mellitus with hyperglycemia: Secondary | ICD-10-CM

## 2021-02-14 DIAGNOSIS — Z9641 Presence of insulin pump (external) (internal): Secondary | ICD-10-CM | POA: Diagnosis present

## 2021-02-14 DIAGNOSIS — I248 Other forms of acute ischemic heart disease: Secondary | ICD-10-CM | POA: Diagnosis present

## 2021-02-14 DIAGNOSIS — E1042 Type 1 diabetes mellitus with diabetic polyneuropathy: Secondary | ICD-10-CM | POA: Diagnosis present

## 2021-02-14 LAB — BASIC METABOLIC PANEL
Anion gap: 8 (ref 5–15)
BUN: 18 mg/dL (ref 6–20)
CO2: 24 mmol/L (ref 22–32)
Calcium: 9.2 mg/dL (ref 8.9–10.3)
Chloride: 107 mmol/L (ref 98–111)
Creatinine, Ser: 0.93 mg/dL (ref 0.44–1.00)
GFR, Estimated: >60 mL/min (ref >60)
Glucose, Bld: 235 mg/dL — ABNORMAL HIGH (ref 70–99)
Potassium: 4.3 mmol/L (ref 3.5–5.1)
Sodium: 139 mmol/L (ref 135–145)

## 2021-02-14 LAB — CBC WITH DIFFERENTIAL/PLATELET
Abs Immature Granulocytes: 0.06 10*3/uL (ref 0.00–0.07)
Basophils Absolute: 0 10*3/uL (ref 0.0–0.1)
Basophils Relative: 0 %
Eosinophils Absolute: 0 10*3/uL (ref 0.0–0.5)
Eosinophils Relative: 0 %
HCT: 48.3 % — ABNORMAL HIGH (ref 36.0–46.0)
Hemoglobin: 15.1 g/dL — ABNORMAL HIGH (ref 12.0–15.0)
Immature Granulocytes: 1 %
Lymphocytes Relative: 15 %
Lymphs Abs: 1.4 10*3/uL (ref 0.7–4.0)
MCH: 27 pg (ref 26.0–34.0)
MCHC: 31.3 g/dL (ref 30.0–36.0)
MCV: 86.3 fL (ref 80.0–100.0)
Monocytes Absolute: 0.7 10*3/uL (ref 0.1–1.0)
Monocytes Relative: 7 %
Neutro Abs: 7.1 10*3/uL (ref 1.7–7.7)
Neutrophils Relative %: 77 %
Platelets: 296 10*3/uL (ref 150–400)
RBC: 5.6 MIL/uL — ABNORMAL HIGH (ref 3.87–5.11)
RDW: 13.6 % (ref 11.5–15.5)
WBC: 9.3 10*3/uL (ref 4.0–10.5)
nRBC: 0 % (ref 0.0–0.2)

## 2021-02-14 LAB — PROTIME-INR
INR: 1.1 (ref 0.8–1.2)
Prothrombin Time: 14 seconds (ref 11.4–15.2)

## 2021-02-14 LAB — BRAIN NATRIURETIC PEPTIDE: B Natriuretic Peptide: 624.2 pg/mL — ABNORMAL HIGH (ref 0.0–100.0)

## 2021-02-14 LAB — CBG MONITORING, ED: Glucose-Capillary: 152 mg/dL — ABNORMAL HIGH (ref 70–99)

## 2021-02-14 LAB — TROPONIN I (HIGH SENSITIVITY)
Troponin I (High Sensitivity): 27 ng/L — ABNORMAL HIGH (ref <18)
Troponin I (High Sensitivity): 24 ng/L — ABNORMAL HIGH (ref <18)

## 2021-02-14 MED ORDER — MORPHINE SULFATE (PF) 4 MG/ML IV SOLN
4.0000 mg | Freq: Once | INTRAVENOUS | Status: AC
  Administered 2021-02-14: 4 mg via INTRAVENOUS
  Filled 2021-02-14: qty 1

## 2021-02-14 MED ORDER — NITROGLYCERIN 2 % TD OINT
1.0000 [in_us] | TOPICAL_OINTMENT | Freq: Once | TRANSDERMAL | Status: AC
  Administered 2021-02-14: 1 [in_us] via TOPICAL
  Filled 2021-02-14: qty 1

## 2021-02-14 MED ORDER — HYDROCODONE-ACETAMINOPHEN 5-325 MG PO TABS: 1.0000 | ORAL_TABLET | Freq: Four times a day (QID) | ORAL | 0 refills | Status: DC | PRN

## 2021-02-14 MED ORDER — FUROSEMIDE 10 MG/ML IJ SOLN
20.0000 mg | Freq: Once | INTRAMUSCULAR | Status: AC
  Administered 2021-02-15: 20 mg via INTRAVENOUS
  Filled 2021-02-14: qty 2

## 2021-02-14 MED ORDER — ENOXAPARIN SODIUM 80 MG/0.8ML IJ SOSY
80.0000 mg | PREFILLED_SYRINGE | Freq: Once | INTRAMUSCULAR | Status: AC
  Administered 2021-02-14: 80 mg via SUBCUTANEOUS
  Filled 2021-02-14: qty 0.8

## 2021-02-14 NOTE — Progress Notes (Signed)
Attempted lower extremity venous duplex, however patient has left.  02/14/2021 7:19 PM Eula Fried., MHA, RVT, RDCS, RDMS

## 2021-02-14 NOTE — ED Triage Notes (Signed)
Pt here with shob and bil lower ext edema with pain for approx 2 weeks. Pt with recent surgery.

## 2021-02-14 NOTE — ED Notes (Signed)
Pt unable to complete CT - pt unable to lay flat even after placing O2 on patient. CT said that pt turned blue when she was lying flat. MD notified.

## 2021-02-14 NOTE — ED Provider Notes (Signed)
Emergency Medicine Provider Triage Evaluation Note  Amanda Frederick , a 50 y.o. female  was evaluated in triage.  Pt complains of shortness of breath, bilateral leg swelling, recently had surgery 6 weeks ago, she had an open reduction of the left ankle, she started to feel short of breath and worsening leg swelling which started 2 weeks ago, shortness of breath is consistent, no chest pain,  she has  pain in the back of her left calf.  As well as worsening swelling.  She was seen by Dr. Magnus Ivan today with concern for a DVT and possible PE.Marland Kitchen  Review of Systems  Positive: SOB, bilateral leg swelling Negative: Chest pain, fevers  Physical Exam  BP (!) 159/109   Pulse (!) 116   Temp 98.1 F (36.7 C) (Oral)   Resp 20   SpO2 93%  Gen:   Awake, no distress   Resp:  Normal effort  MSK:   Moves extremities without difficulty  Other:  Bilateral pedal edema 2+, worse on the left versus the right.  Neurovascular fully intact.  Medical Decision Making  Medically screening exam initiated at 7:00 PM.  Appropriate orders placed.  Trudee Chirino was informed that the remainder of the evaluation will be completed by another provider, this initial triage assessment does not replace that evaluation, and the importance of remaining in the ED until their evaluation is complete.  Presents with shortness of breath, leg swelling, lab work and imaging have been ordered, patient will need further work-up here.   Carroll Sage, PA-C 02/14/21 Julian Reil    Bethann Berkshire, MD 02/18/21 1049

## 2021-02-14 NOTE — ED Provider Notes (Signed)
[facility] EMERGENCY DEPARTMENT Provider Note   CSN: [id number] Arrival date & time: 02/14/21  1658     History Chief Complaint  Patient presents with   Leg Swelling   Shortness of Breath    Amanda Frederick is a 50 y.o. female.  She had a bimalleolar ankle fracture repaired 6/10 by Dr. Ninfa Linden.  She was doing well up until about 2 weeks ago when she started having more leg pain and swelling, increased shortness of breath and chest pain.  She saw Dr. Ninfa Linden today and he sent her here for evaluation of a possible DVT and PE.  No prior history of PE.  No cough.  Moderate shortness of breath.  Difficulty lying down flat.  Leg pain extends from foot up to the thigh.  The history is provided by the patient.  Shortness of Breath Severity:  Moderate Onset quality:  Gradual Duration:  2 weeks Timing:  Constant Progression:  Worsening Chronicity:  New Relieved by:  Nothing Worsened by:  Activity Ineffective treatments:  Rest Associated symptoms: chest pain   Associated symptoms: no abdominal pain, no cough, no fever, no headaches, no hemoptysis, no neck pain, no rash, no sore throat, no sputum production and no vomiting   Risk factors: prolonged immobilization   Risk factors: no hx of PE/DVT       Past Medical History:  Diagnosis Date   AC (acromioclavicular) joint bone spurs    Anemia    Anxiety    Arthritis    Bronchitis    Bulging lumbar disc    Bursitis    Complication of anesthesia    "has died 3 separate times under anesthesia"   Depression    Diabetes ([place])    Environmental allergies    Fibromyalgia    High blood pressure    High cholesterol    History of COVID-19 01/2020   was hospitalized   History of endometriosis    History of uterine fibroid    Migraines    Neuropathy    eyes , hands and feet    Pinched nerve    Pneumonia    Seizure (HCC)    after eye test only   Spondylosis    Tachycardia    after COVID   Tendonitis    Vision  abnormalities     Patient Active Problem List   Diagnosis Date Noted   Ankle fracture, bimalleolar, closed, left, initial encounter 01/04/2021   Displaced bimalleolar fracture of left lower leg, initial encounter for closed fracture 01/02/2021   Diabetes mellitus type 2, uncontrolled, with complications ([place]) 34/19/6222   Diabetic neuropathy ([place]) 03/03/2020   Anxiety 03/03/2020   HLD (hyperlipidemia) 03/03/2020   Acute respiratory failure with hypoxia ([place]) 03/03/2020   COVID-19 virus infection 03/02/2020   Neck pain 04/22/2013   Diabetes ([place])    High cholesterol    Depression    Inflammatory and toxic neuropathy, unspecified 04/04/2013   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, not stated as uncontrolled(250.51) 03/21/2013   Unspecified hereditary and idiopathic peripheral neuropathy 03/21/2013   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     Family History  Problem Relation Age of Onset   Anemia Mother    High Cholesterol Mother    Diabetes Mother    Arthritis Mother     Social History   Tobacco Use   Smoking status: Never   Smokeless tobacco: Never  Substance Use Topics   Alcohol use: Not Currently    Comment: special occassions   Drug use: No    Home Medications Prior to Admission medications   Medication Sig Start Date End Date Taking? Authorizing Provider  albuterol (VENTOLIN HFA) 108 (90  Base) MCG/ACT inhaler Inhale 1 puff into the lungs every 6 (six) hours as needed for wheezing or shortness of breath.    [provider]  cholecalciferol (VITAMIN D) 1000 UNITS tablet Take 1,000 Units by mouth daily.    [provider]  doxycycline (VIBRA-TABS) 100 MG tablet Take 1 tablet (100 mg total) by mouth 2 (two) times daily. 01/07/21   Mcarthur Rossetti, MD  fish oil-omega-3 fatty acids 1000 MG capsule Take 2 g by mouth 2 (two) times daily.    [provider]  gabapentin (NEURONTIN) 100 MG capsule Take 1 capsule (100 mg total) by mouth 3 (three) times daily. 01/17/21   Mcarthur Rossetti, MD  glucose blood (BAYER CONTOUR NEXT TEST) test strip Test 8-10 times per day 8-10 lancets/day 06/14/13   Renato Shin, MD  HYDROcodone-acetaminophen (NORCO/VICODIN) 5-325 MG tablet Take 1-2 tablets by mouth every 6 (six) hours as needed for moderate pain. 02/14/21   Mcarthur Rossetti, MD  insulin aspart (NOVOLOG) 100 UNIT/ML injection For use in pump, total of 120 units per day 06/14/13   Renato Shin, MD  Insulin Infusion Pump Supplies (PARADIGM QUICK-SET 32" 6MM) MISC 1 Device by Does not apply route continuous. Vovolog    [provider]  lisinopril (ZESTRIL) 20 MG tablet Take 20 mg by mouth daily.    [provider]  oxyCODONE (OXY IR/ROXICODONE) 5 MG immediate release tablet Take 1 tablet (5 mg total) by mouth every 6 (six) hours as needed for moderate pain (pain score 4-6). 02/04/21   Mcarthur Rossetti, MD  pantoprazole (PROTONIX) 40 MG tablet Take 1 tablet (40 mg total) by mouth daily for 30 doses. Patient not taking: Reported on 01/03/2021 03/07/20 04/06/20  Elgergawy, Silver Huguenin, MD  tiZANidine (ZANAFLEX) 4 MG tablet Take 1 tablet (4 mg total) by mouth every 8 (eight) hours as needed for muscle spasms. 01/05/21   Mcarthur Rossetti, MD  DULoxetine (CYMBALTA) 60 MG capsule Take 1 capsule (60 mg total) by mouth daily. Patient not taking:  No sig reported 04/22/13 01/04/21  Marcial Pacas, MD    Allergies    Other  Review of Systems   Review of Systems  Constitutional:  Negative for fever.  HENT:  Negative for sore throat.   Eyes:  Negative for visual disturbance.  Respiratory:  Positive for shortness of breath. Negative for cough, hemoptysis and sputum production.   Cardiovascular:  Positive for chest pain and leg swelling.  Gastrointestinal:  Negative for abdominal pain and vomiting.  Genitourinary:  Negative for dysuria.  Musculoskeletal:  Negative for neck pain.  Skin:  Negative for rash.  Neurological:  Negative for headaches.   Physical Exam Updated Vital Signs BP Marland Kitchen)  156/115 (BP Location: Right Arm)   Pulse (!) 115   Temp 98.4 F (36.9 C) (Oral)   Resp 18   SpO2 97%   Physical Exam Vitals and nursing note reviewed.  Constitutional:      General: She is in acute distress.     Appearance: She is well-developed.  HENT:     Head: Normocephalic and atraumatic.  Eyes:     Conjunctiva/sclera: Conjunctivae normal.  Cardiovascular:     Rate and Rhythm: Regular rhythm. Tachycardia present.     Heart sounds: No murmur heard. Pulmonary:     Effort: Tachypnea and accessory muscle usage present. No respiratory distress.     Breath sounds: Normal breath sounds.  Abdominal:     Palpations: Abdomen is soft.     Tenderness: There is no abdominal tenderness.  Musculoskeletal:     Cervical back: Neck supple.     Right lower leg: Edema present.     Left lower leg: Tenderness present. Edema present.  Skin:    General: Skin is warm and dry.     Capillary Refill: Capillary refill takes less than 2 seconds.  Neurological:     General: No focal deficit present.     Mental Status: She is alert.    ED Results / Procedures / Treatments   Labs (all labs ordered are listed, but only abnormal results are displayed) Labs Reviewed  BASIC METABOLIC PANEL - Abnormal; Notable for the following components:      Result Value    Glucose, Bld 235 (*)    All other components within normal limits  CBC WITH DIFFERENTIAL/PLATELET - Abnormal; Notable for the following components:   RBC 5.60 (*)    Hemoglobin 15.1 (*)    HCT 48.3 (*)    All other components within normal limits  BRAIN NATRIURETIC PEPTIDE - Abnormal; Notable for the following components:   B Natriuretic Peptide 624.2 (*)    All other components within normal limits  HEPATIC FUNCTION PANEL - Abnormal; Notable for the following components:   Total Protein 6.1 (*)    Albumin 3.3 (*)    Alkaline Phosphatase 199 (*)    Bilirubin, Direct 0.3 (*)    All other components within normal limits  HEMOGLOBIN A1C - Abnormal; Notable for the following components:   Hgb A1c MFr Bld 6.8 (*)    All other components within normal limits  URINALYSIS, ROUTINE W REFLEX MICROSCOPIC - Abnormal; Notable for the following components:   Color, Urine COLORLESS (*)    Specific Gravity, Urine 1.004 (*)    All other components within normal limits  T4, FREE - Abnormal; Notable for the following components:   Free T4 1.44 (*)    All other components within normal limits  CBC - Abnormal; Notable for the following components:   RBC 5.51 (*)    HCT 48.0 (*)    All other components within normal limits  CK - Abnormal; Notable for the following components:   Total CK 337 (*)    All other components within normal limits  D-DIMER, QUANTITATIVE (NOT AT [facility]) - Abnormal; Notable for the following components:   D-Dimer, Quant 0.90 (*)    All other components within normal limits  CBC WITH DIFFERENTIAL/PLATELET - Abnormal; Notable for the following components:   RBC 5.26 (*)    All other components within normal limits  COMPREHENSIVE METABOLIC PANEL - Abnormal; Notable for the following components:   Glucose, Bld 143 (*)    Calcium 8.7 (*)  Total Protein 5.7 (*)    Albumin 3.2 (*)    Alkaline Phosphatase 183 (*)    All other components within normal limits  CBG MONITORING,  ED - Abnormal; Notable for the following components:   Glucose-Capillary 152 (*)    All other components within normal limits  CBG MONITORING, ED - Abnormal; Notable for the following components:   Glucose-Capillary 156 (*)    All other components within normal limits  CBG MONITORING, ED - Abnormal; Notable for the following components:   Glucose-Capillary 129 (*)    All other components within normal limits  CBG MONITORING, ED - Abnormal; Notable for the following components:   Glucose-Capillary 150 (*)    All other components within normal limits  CBG MONITORING, ED - Abnormal; Notable for the following components:   Glucose-Capillary 181 (*)    All other components within normal limits  TROPONIN I (HIGH SENSITIVITY) - Abnormal; Notable for the following components:   Troponin I (High Sensitivity) 27 (*)    All other components within normal limits  TROPONIN I (HIGH SENSITIVITY) - Abnormal; Notable for the following components:   Troponin I (High Sensitivity) 24 (*)    All other components within normal limits  TROPONIN I (HIGH SENSITIVITY) - Abnormal; Notable for the following components:   Troponin I (High Sensitivity) 23 (*)    All other components within normal limits  TROPONIN I (HIGH SENSITIVITY) - Abnormal; Notable for the following components:   Troponin I (High Sensitivity) 23 (*)    All other components within normal limits  RESP PANEL BY RT-PCR (FLU A&B, COVID) ARPGX2  PROTIME-INR  MAGNESIUM  LIPID PANEL  TSH  SODIUM, URINE, RANDOM  CREATININE, URINE, RANDOM  PHOSPHORUS  URINE DRUGS OF ABUSE SCREEN W ALC, ROUTINE (REF LAB)  T3  CBG MONITORING, ED  CBG MONITORING, ED  CBG MONITORING, ED    EKG EKG Interpretation  Date/Time:  Thursday February 14 2021 18:53:14 EDT Ventricular Rate:  118 PR Interval:  136 QRS Duration: 60 QT Interval:  286 QTC Calculation: 400 R Axis:   129 Text Interpretation: Sinus tachycardia Possible Left atrial enlargement Low voltage  QRS Septal infarct , age undetermined Lateral infarct , age undetermined Abnormal ECG since last tracing rate faster (8/21) Confirmed by Aletta Edouard [id number]) on 02/14/2021 8:56:48 PM  Radiology DG Chest 2 View  Result Date: 02/14/2021 CLINICAL DATA:  Shortness of breath, bilateral lower extremity edema EXAM: CHEST - 2 VIEW COMPARISON:  03/02/2020 FINDINGS: The heart is top normal in size. Pulmonary vascular congestion and suspected mild interstitial edema. Moderate left and small right pleural effusions. Associated lower lobe opacities, likely atelectasis. No pneumothorax. IMPRESSION: Suspected mild interstitial edema. Moderate left and small right pleural effusions. Associated lower lobe opacities, likely atelectasis. Electronically Signed   By: Julian Hy M.D.   On: 02/14/2021 19:41   XR Ankle Complete Left  Result Date: 02/14/2021 3 views left ankle show a healing bimalleolar ankle fracture status post open reduction/internal fixation.  The mortise is congruent.   Procedures .Critical Care  Date/Time: 02/15/2021 5:21 PM Performed by: Hayden Rasmussen, MD Authorized by: Hayden Rasmussen, MD   Critical care provider statement:    Critical care time (minutes):  45   Critical care time was exclusive of:  Separately billable procedures and treating other patients   Critical care was necessary to treat or prevent imminent or life-threatening deterioration of the following conditions:  Cardiac failure and respiratory failure   Critical care  was time spent personally by me on the following activities:  Discussions with consultants, evaluation of patient's response to treatment, examination of patient, ordering and performing treatments and interventions, ordering and review of laboratory studies, ordering and review of radiographic studies, pulse oximetry, re-evaluation of patient's condition, obtaining history from patient or surrogate, review of old charts and development of treatment  plan with patient or surrogate   Medications Ordered in ED Medications  lisinopril (ZESTRIL) tablet 20 mg (20 mg Oral Given 02/15/21 1039)  oxyCODONE (Oxy IR/ROXICODONE) immediate release tablet 5 mg (5 mg Oral Given 02/15/21 1546)  pantoprazole (PROTONIX) EC tablet 40 mg (40 mg Oral Given 02/15/21 1039)  tiZANidine (ZANAFLEX) tablet 4 mg (4 mg Oral Given 02/15/21 1546)  acetaminophen (TYLENOL) tablet 650 mg (has no administration in time range)    Or  acetaminophen (TYLENOL) suppository 650 mg (has no administration in time range)  HYDROcodone-acetaminophen (NORCO/VICODIN) 5-325 MG per tablet 1-2 tablet (has no administration in time range)  furosemide (LASIX) injection 40 mg (40 mg Intravenous Given 02/15/21 0528)  sodium chloride flush (NS) 0.9 % injection 3 mL (3 mLs Intravenous Given 02/15/21 0349)  sodium chloride flush (NS) 0.9 % injection 3 mL (has no administration in time range)  0.9 %  sodium chloride infusion (has no administration in time range)  enoxaparin (LOVENOX) injection 80 mg (80 mg Subcutaneous Given 02/15/21 1040)  insulin pump ( Subcutaneous Not Given 02/15/21 1712)  aspirin EC tablet 81 mg (81 mg Oral Given 02/15/21 1039)  aspirin EC tablet 81 mg ( Oral Canceled Entry 02/15/21 0321)  enoxaparin (LOVENOX) injection 80 mg (80 mg Subcutaneous Given 02/14/21 2206)  morphine 4 MG/ML injection 4 mg (4 mg Intravenous Given 02/14/21 2121)  furosemide (LASIX) injection 20 mg (20 mg Intravenous Given 02/15/21 0056)  nitroGLYCERIN (NITROGLYN) 2 % ointment 1 inch (1 inch Topical Given 02/14/21 2326)    ED Course  I have reviewed the triage vital signs and the nursing notes.  Pertinent labs & imaging results that were available during my care of the patient were reviewed by me and considered in my medical decision making (see chart for details).  Clinical Course as of 02/15/21 1717  Thu Feb 14, 2021  2015 VAS [place] LOWER EXTREMITY VENOUS (DVT) (ONLY [facility] & [facility]) [WF]  2246 Was informed by the  nurse that the patient could not tolerate laying down to do the CT angio chest.  Reportedly she started turning blue. [MB]    Clinical Course User Index [MB] Hayden Rasmussen, MD [WF] Marcello Fennel, PA-C   MDM Rules/Calculators/A&P                          This patient complains of shortness of breath left leg swelling and pain; this involves an extensive number of treatment Options and is a complaint that carries with it a high risk of complications and Morbidity. The differential includes PE, DVT, hypertensive emergency, CHF, cardiomyopathy, anemia, sepsis  I ordered, reviewed and interpreted labs, which included CBC with normal white count normal hemoglobin, chemistries fairly normal other than elevated blood glucose, elevated alk phos, troponins elevated will need to be trended, BNP moderately elevated no priors to compare with I ordered medication IV Lasix topical nitroglycerin, subcu Lovenox, IV pain medication I ordered imaging studies which included chest x-ray and I independently    visualized and interpreted imaging which showed probable CHF and bilateral effusions.  Patient could not tolerate CT  angio chest and vascular ultrasound unavailable at this time Additional history obtained from patient's husband Previous records obtained and reviewed in epic including prior operative note   Critical Interventions: Work-up and management of patient's acute shortness of breath/respiratory distress  After the interventions stated above, I reevaluated the patient and found patient still to be clinically symptomatic tachycardic hypertensive and tachypneic.  She will need to be admitted to the hospital for further evaluation.  Her care is signed out to Dr. Christy Gentles to help assist with getting the patient admitted.  Feels she will need cardiology input and further diuresis and hopefully we can obtain CT angio chest when more stable.   Final Clinical Impression(s) / ED Diagnoses Final  diagnoses:  Acute respiratory distress  Congestive heart failure, unspecified HF chronicity, unspecified heart failure type [facility])    Rx / DC Orders ED Discharge Orders     None        Hayden Rasmussen, MD 02/15/21 1724

## 2021-02-14 NOTE — Progress Notes (Signed)
Patient is 6 weeks status post open reduction/intra fixation of a complex left ankle fracture.  She has been doing well until the last week she developed more swelling in her foot and ankle.  She feels like she is having some shortness of breath as well.  Her incisions look good over her left ankle but her foot is swollen as well as her calf.  I am certainly concerned that she may have a DVT and potentially even a PE.  We need to send her for an urgent ultrasound and possibly send her to the emergency room for a CTA to rule out a PE.  Her blood pressure in the office right now is 136/95.  Her O2 sats are 97% on room air.  She is tachycardic with a heart rate of 114.  She states she still feels short of breath and this is new to her.  Given the fact that she had ankle surgery 6 weeks ago and has been having limited mobility and now acute onset of shortness of breath combined with worsening left foot and ankle swelling, this is concerning for a DVT and potentially an acute PE.  I have told her husband to take her urgently over to the [facility] emergency room for further work-up which would likely need a chest CTA to rule out a PE and I ultrasound to rule out DVT of the left lower extremity.

## 2021-02-15 ENCOUNTER — Observation Stay (HOSPITAL_COMMUNITY): Payer: 59

## 2021-02-15 DIAGNOSIS — I255 Ischemic cardiomyopathy: Secondary | ICD-10-CM | POA: Diagnosis present

## 2021-02-15 DIAGNOSIS — E1029 Type 1 diabetes mellitus with other diabetic kidney complication: Secondary | ICD-10-CM

## 2021-02-15 DIAGNOSIS — E1042 Type 1 diabetes mellitus with diabetic polyneuropathy: Secondary | ICD-10-CM | POA: Diagnosis present

## 2021-02-15 DIAGNOSIS — E1069 Type 1 diabetes mellitus with other specified complication: Secondary | ICD-10-CM | POA: Diagnosis present

## 2021-02-15 DIAGNOSIS — I509 Heart failure, unspecified: Secondary | ICD-10-CM

## 2021-02-15 DIAGNOSIS — R0603 Acute respiratory distress: Secondary | ICD-10-CM

## 2021-02-15 DIAGNOSIS — R000 Tachycardia, unspecified: Secondary | ICD-10-CM | POA: Diagnosis present

## 2021-02-15 DIAGNOSIS — J9601 Acute respiratory failure with hypoxia: Secondary | ICD-10-CM

## 2021-02-15 DIAGNOSIS — F32A Depression, unspecified: Secondary | ICD-10-CM | POA: Diagnosis present

## 2021-02-15 DIAGNOSIS — E109 Type 1 diabetes mellitus without complications: Secondary | ICD-10-CM | POA: Diagnosis not present

## 2021-02-15 DIAGNOSIS — I2510 Atherosclerotic heart disease of native coronary artery without angina pectoris: Secondary | ICD-10-CM | POA: Diagnosis present

## 2021-02-15 DIAGNOSIS — Z9641 Presence of insulin pump (external) (internal): Secondary | ICD-10-CM | POA: Diagnosis present

## 2021-02-15 DIAGNOSIS — R609 Edema, unspecified: Secondary | ICD-10-CM | POA: Diagnosis not present

## 2021-02-15 DIAGNOSIS — Z9851 Tubal ligation status: Secondary | ICD-10-CM | POA: Diagnosis not present

## 2021-02-15 DIAGNOSIS — F419 Anxiety disorder, unspecified: Secondary | ICD-10-CM | POA: Diagnosis present

## 2021-02-15 DIAGNOSIS — E785 Hyperlipidemia, unspecified: Secondary | ICD-10-CM

## 2021-02-15 DIAGNOSIS — I10 Essential (primary) hypertension: Secondary | ICD-10-CM | POA: Diagnosis not present

## 2021-02-15 DIAGNOSIS — Z90710 Acquired absence of both cervix and uterus: Secondary | ICD-10-CM | POA: Diagnosis not present

## 2021-02-15 DIAGNOSIS — M797 Fibromyalgia: Secondary | ICD-10-CM | POA: Diagnosis present

## 2021-02-15 DIAGNOSIS — Z8701 Personal history of pneumonia (recurrent): Secondary | ICD-10-CM | POA: Diagnosis not present

## 2021-02-15 DIAGNOSIS — E782 Mixed hyperlipidemia: Secondary | ICD-10-CM | POA: Diagnosis present

## 2021-02-15 DIAGNOSIS — E7800 Pure hypercholesterolemia, unspecified: Secondary | ICD-10-CM | POA: Diagnosis not present

## 2021-02-15 DIAGNOSIS — I5021 Acute systolic (congestive) heart failure: Secondary | ICD-10-CM | POA: Diagnosis not present

## 2021-02-15 DIAGNOSIS — I428 Other cardiomyopathies: Secondary | ICD-10-CM | POA: Diagnosis present

## 2021-02-15 DIAGNOSIS — K219 Gastro-esophageal reflux disease without esophagitis: Secondary | ICD-10-CM | POA: Diagnosis present

## 2021-02-15 DIAGNOSIS — I5022 Chronic systolic (congestive) heart failure: Secondary | ICD-10-CM | POA: Diagnosis present

## 2021-02-15 DIAGNOSIS — Z794 Long term (current) use of insulin: Secondary | ICD-10-CM | POA: Diagnosis not present

## 2021-02-15 DIAGNOSIS — Z20822 Contact with and (suspected) exposure to covid-19: Secondary | ICD-10-CM | POA: Diagnosis present

## 2021-02-15 DIAGNOSIS — J918 Pleural effusion in other conditions classified elsewhere: Secondary | ICD-10-CM | POA: Diagnosis present

## 2021-02-15 DIAGNOSIS — I951 Orthostatic hypotension: Secondary | ICD-10-CM | POA: Diagnosis not present

## 2021-02-15 DIAGNOSIS — I5041 Acute combined systolic (congestive) and diastolic (congestive) heart failure: Secondary | ICD-10-CM | POA: Diagnosis present

## 2021-02-15 DIAGNOSIS — Z8616 Personal history of COVID-19: Secondary | ICD-10-CM | POA: Diagnosis not present

## 2021-02-15 DIAGNOSIS — I110 Hypertensive heart disease with heart failure: Secondary | ICD-10-CM | POA: Diagnosis present

## 2021-02-15 DIAGNOSIS — I248 Other forms of acute ischemic heart disease: Secondary | ICD-10-CM | POA: Diagnosis present

## 2021-02-15 DIAGNOSIS — R809 Proteinuria, unspecified: Secondary | ICD-10-CM

## 2021-02-15 DIAGNOSIS — R911 Solitary pulmonary nodule: Secondary | ICD-10-CM | POA: Diagnosis present

## 2021-02-15 LAB — COMPREHENSIVE METABOLIC PANEL
ALT: 34 U/L (ref 0–44)
AST: 26 U/L (ref 15–41)
Albumin: 3.2 g/dL — ABNORMAL LOW (ref 3.5–5.0)
Alkaline Phosphatase: 183 U/L — ABNORMAL HIGH (ref 38–126)
Anion gap: 11 (ref 5–15)
BUN: 15 mg/dL (ref 6–20)
CO2: 24 mmol/L (ref 22–32)
Calcium: 8.7 mg/dL — ABNORMAL LOW (ref 8.9–10.3)
Chloride: 107 mmol/L (ref 98–111)
Creatinine, Ser: 0.73 mg/dL (ref 0.44–1.00)
GFR, Estimated: >60 mL/min (ref >60)
Glucose, Bld: 143 mg/dL — ABNORMAL HIGH (ref 70–99)
Potassium: 3.6 mmol/L (ref 3.5–5.1)
Sodium: 142 mmol/L (ref 135–145)
Total Bilirubin: 1 mg/dL (ref 0.3–1.2)
Total Protein: 5.7 g/dL — ABNORMAL LOW (ref 6.5–8.1)

## 2021-02-15 LAB — URINALYSIS, ROUTINE W REFLEX MICROSCOPIC
Bilirubin Urine: NEGATIVE
Glucose, UA: NEGATIVE mg/dL
Hgb urine dipstick: NEGATIVE
Ketones, ur: NEGATIVE mg/dL
Leukocytes,Ua: NEGATIVE
Nitrite: NEGATIVE
Protein, ur: NEGATIVE mg/dL
Specific Gravity, Urine: 1.004 — ABNORMAL LOW (ref 1.005–1.030)
pH: 6 (ref 5.0–8.0)

## 2021-02-15 LAB — CBC WITH DIFFERENTIAL/PLATELET
Abs Immature Granulocytes: 0.02 10*3/uL (ref 0.00–0.07)
Basophils Absolute: 0 10*3/uL (ref 0.0–0.1)
Basophils Relative: 0 %
Eosinophils Absolute: 0 10*3/uL (ref 0.0–0.5)
Eosinophils Relative: 1 %
HCT: 45.2 % (ref 36.0–46.0)
Hemoglobin: 14.3 g/dL (ref 12.0–15.0)
Immature Granulocytes: 0 %
Lymphocytes Relative: 16 %
Lymphs Abs: 1.2 10*3/uL (ref 0.7–4.0)
MCH: 27.2 pg (ref 26.0–34.0)
MCHC: 31.6 g/dL (ref 30.0–36.0)
MCV: 85.9 fL (ref 80.0–100.0)
Monocytes Absolute: 0.7 10*3/uL (ref 0.1–1.0)
Monocytes Relative: 9 %
Neutro Abs: 5.5 10*3/uL (ref 1.7–7.7)
Neutrophils Relative %: 74 %
Platelets: 260 10*3/uL (ref 150–400)
RBC: 5.26 MIL/uL — ABNORMAL HIGH (ref 3.87–5.11)
RDW: 13.7 % (ref 11.5–15.5)
WBC: 7.4 10*3/uL (ref 4.0–10.5)
nRBC: 0 % (ref 0.0–0.2)

## 2021-02-15 LAB — CBC
HCT: 48 % — ABNORMAL HIGH (ref 36.0–46.0)
Hemoglobin: 14.5 g/dL (ref 12.0–15.0)
MCH: 26.3 pg (ref 26.0–34.0)
MCHC: 30.2 g/dL (ref 30.0–36.0)
MCV: 87.1 fL (ref 80.0–100.0)
Platelets: 286 10*3/uL (ref 150–400)
RBC: 5.51 MIL/uL — ABNORMAL HIGH (ref 3.87–5.11)
RDW: 13.7 % (ref 11.5–15.5)
WBC: 7.3 10*3/uL (ref 4.0–10.5)
nRBC: 0 % (ref 0.0–0.2)

## 2021-02-15 LAB — PHOSPHORUS: Phosphorus: 4.6 mg/dL (ref 2.5–4.6)

## 2021-02-15 LAB — T4, FREE: Free T4: 1.44 ng/dL — ABNORMAL HIGH (ref 0.61–1.12)

## 2021-02-15 LAB — D-DIMER, QUANTITATIVE: D-Dimer, Quant: 0.9 ug/mL-FEU — ABNORMAL HIGH (ref 0.00–0.50)

## 2021-02-15 LAB — CK: Total CK: 337 U/L — ABNORMAL HIGH (ref 38–234)

## 2021-02-15 LAB — ECHOCARDIOGRAM COMPLETE
Area-P 1/2: 5.54 cm2
Height: 66 in
S' Lateral: 3.4 cm
Weight: 2720 oz

## 2021-02-15 LAB — TSH: TSH: 3.196 u[IU]/mL (ref 0.350–4.500)

## 2021-02-15 LAB — HEMOGLOBIN A1C
Hgb A1c MFr Bld: 6.8 % — ABNORMAL HIGH (ref 4.8–5.6)
Mean Plasma Glucose: 148.46 mg/dL

## 2021-02-15 LAB — SODIUM, URINE, RANDOM: Sodium, Ur: 116 mmol/L

## 2021-02-15 LAB — TROPONIN I (HIGH SENSITIVITY)
Troponin I (High Sensitivity): 23 ng/L — ABNORMAL HIGH (ref <18)
Troponin I (High Sensitivity): 23 ng/L — ABNORMAL HIGH (ref <18)

## 2021-02-15 LAB — LIPID PANEL
Cholesterol: 153 mg/dL (ref 0–200)
HDL: 44 mg/dL (ref >40)
LDL Cholesterol: 97 mg/dL (ref 0–99)
Total CHOL/HDL Ratio: 3.5 RATIO
Triglycerides: 60 mg/dL (ref <150)
VLDL: 12 mg/dL (ref 0–40)

## 2021-02-15 LAB — HEPATIC FUNCTION PANEL
ALT: 36 U/L (ref 0–44)
AST: 30 U/L (ref 15–41)
Albumin: 3.3 g/dL — ABNORMAL LOW (ref 3.5–5.0)
Alkaline Phosphatase: 199 U/L — ABNORMAL HIGH (ref 38–126)
Bilirubin, Direct: 0.3 mg/dL — ABNORMAL HIGH (ref 0.0–0.2)
Indirect Bilirubin: 0.8 mg/dL (ref 0.3–0.9)
Total Bilirubin: 1.1 mg/dL (ref 0.3–1.2)
Total Protein: 6.1 g/dL — ABNORMAL LOW (ref 6.5–8.1)

## 2021-02-15 LAB — RESP PANEL BY RT-PCR (FLU A&B, COVID) ARPGX2
Influenza A by PCR: NEGATIVE
Influenza B by PCR: NEGATIVE
SARS Coronavirus 2 by RT PCR: NEGATIVE

## 2021-02-15 LAB — CBG MONITORING, ED
Glucose-Capillary: 92 mg/dL (ref 70–99)
Glucose-Capillary: 71 mg/dL (ref 70–99)
Glucose-Capillary: 97 mg/dL (ref 70–99)
Glucose-Capillary: 156 mg/dL — ABNORMAL HIGH (ref 70–99)
Glucose-Capillary: 129 mg/dL — ABNORMAL HIGH (ref 70–99)
Glucose-Capillary: 150 mg/dL — ABNORMAL HIGH (ref 70–99)
Glucose-Capillary: 181 mg/dL — ABNORMAL HIGH (ref 70–99)

## 2021-02-15 LAB — CREATININE, URINE, RANDOM: Creatinine, Urine: <0.3 mg/dL

## 2021-02-15 LAB — MAGNESIUM: Magnesium: 2 mg/dL (ref 1.7–2.4)

## 2021-02-15 LAB — GLUCOSE, CAPILLARY: Glucose-Capillary: 232 mg/dL — ABNORMAL HIGH (ref 70–99)

## 2021-02-15 MED ORDER — INSULIN PUMP
SUBCUTANEOUS | Status: DC
  Administered 2021-02-15 – 2021-02-17 (×2): 3 via SUBCUTANEOUS
  Administered 2021-02-18 – 2021-02-19 (×4): 1 via SUBCUTANEOUS
  Administered 2021-02-19: 2.5 via SUBCUTANEOUS
  Administered 2021-02-20: 5 via SUBCUTANEOUS
  Administered 2021-02-20: 1 via SUBCUTANEOUS
  Administered 2021-02-20: 2 via SUBCUTANEOUS
  Filled 2021-02-15: qty 1

## 2021-02-15 MED ORDER — FUROSEMIDE 10 MG/ML IJ SOLN
40.0000 mg | Freq: Two times a day (BID) | INTRAMUSCULAR | Status: DC
  Administered 2021-02-15 – 2021-02-19 (×9): 40 mg via INTRAVENOUS
  Filled 2021-02-15 (×9): qty 4

## 2021-02-15 MED ORDER — PANTOPRAZOLE SODIUM 40 MG PO TBEC
40.0000 mg | DELAYED_RELEASE_TABLET | Freq: Every day | ORAL | Status: DC
  Administered 2021-02-15 – 2021-02-21 (×7): 40 mg via ORAL
  Filled 2021-02-15 (×7): qty 1

## 2021-02-15 MED ORDER — OXYCODONE HCL 5 MG PO TABS
5.0000 mg | ORAL_TABLET | Freq: Four times a day (QID) | ORAL | Status: DC | PRN
  Administered 2021-02-15 – 2021-02-21 (×17): 5 mg via ORAL
  Filled 2021-02-15 (×19): qty 1

## 2021-02-15 MED ORDER — TIZANIDINE HCL 4 MG PO TABS
4.0000 mg | ORAL_TABLET | Freq: Three times a day (TID) | ORAL | Status: DC | PRN
  Administered 2021-02-15 – 2021-02-21 (×2): 4 mg via ORAL
  Filled 2021-02-15 (×3): qty 1

## 2021-02-15 MED ORDER — HYDROCODONE-ACETAMINOPHEN 5-325 MG PO TABS
1.0000 | ORAL_TABLET | ORAL | Status: DC | PRN
  Administered 2021-02-16 – 2021-02-17 (×2): 1 via ORAL
  Filled 2021-02-15 (×4): qty 1

## 2021-02-15 MED ORDER — ASPIRIN EC 81 MG PO TBEC
81.0000 mg | DELAYED_RELEASE_TABLET | Freq: Every day | ORAL | Status: DC
  Administered 2021-02-15 – 2021-02-18 (×4): 81 mg via ORAL
  Filled 2021-02-15 (×4): qty 1

## 2021-02-15 MED ORDER — ACETAMINOPHEN 325 MG PO TABS
650.0000 mg | ORAL_TABLET | Freq: Four times a day (QID) | ORAL | Status: DC | PRN
  Administered 2021-02-16 – 2021-02-20 (×6): 650 mg via ORAL
  Filled 2021-02-15 (×8): qty 2

## 2021-02-15 MED ORDER — SODIUM CHLORIDE 0.9 % IV SOLN: 250.0000 mL | INTRAVENOUS | Status: DC | PRN

## 2021-02-15 MED ORDER — ENOXAPARIN SODIUM 80 MG/0.8ML IJ SOSY
80.0000 mg | PREFILLED_SYRINGE | Freq: Two times a day (BID) | INTRAMUSCULAR | Status: DC
  Administered 2021-02-15 – 2021-02-16 (×3): 80 mg via SUBCUTANEOUS
  Filled 2021-02-15 (×4): qty 0.8

## 2021-02-15 MED ORDER — INSULIN ASPART 100 UNIT/ML IJ SOLN
0.0000 [IU] | INTRAMUSCULAR | Status: DC
  Administered 2021-02-15: 2 [IU] via SUBCUTANEOUS

## 2021-02-15 MED ORDER — SODIUM CHLORIDE 0.9% FLUSH
3.0000 mL | Freq: Two times a day (BID) | INTRAVENOUS | Status: DC
  Administered 2021-02-15 – 2021-02-21 (×12): 3 mL via INTRAVENOUS

## 2021-02-15 MED ORDER — SODIUM CHLORIDE 0.9% FLUSH: 3.0000 mL | INTRAVENOUS | Status: DC | PRN

## 2021-02-15 MED ORDER — LISINOPRIL 20 MG PO TABS
20.0000 mg | ORAL_TABLET | Freq: Every day | ORAL | Status: DC
  Administered 2021-02-15 – 2021-02-16 (×2): 20 mg via ORAL
  Filled 2021-02-15 (×2): qty 1

## 2021-02-15 MED ORDER — ACETAMINOPHEN 650 MG RE SUPP: 650.0000 mg | Freq: Four times a day (QID) | RECTAL | Status: DC | PRN

## 2021-02-15 MED ORDER — ASPIRIN EC 81 MG PO TBEC
81.0000 mg | DELAYED_RELEASE_TABLET | Freq: Once | ORAL | Status: DC
  Filled 2021-02-15: qty 1

## 2021-02-15 NOTE — Progress Notes (Signed)
  Echocardiogram 2D Echocardiogram has been attempted. Patient receiving personal care. Nurse request return at later time.  Dorena Dew Issiah Huffaker 02/15/2021, 8:41 AM

## 2021-02-15 NOTE — ED Provider Notes (Signed)
I assumed care in signout to follow-up and admit patient.  Patient with recent bimalleolar fracture required surgery.  Now having peripheral edema and shortness of breath.  Patient also reports weight gain.  She had difficulty undergoing CT angio chest, therefore this will be deferred.  Patient has  already been empirically given Lovenox.  On my  assessment patient appears to be volume overloaded.  Plan will be to give nitroglycerin for blood pressure support.  We will consider giving Lasix for gentle diuresis later in the night. After admission, patient may need to undergo CT angio or echocardiogram. She is awake and alert, in no distress while sitting up.  Distal pulses intact but she does have peripheral edema Discussed with Dr. Adela Glimpse for admission   Zadie Rhine, MD 02/15/21 [phone]

## 2021-02-15 NOTE — H&P (Addendum)
Amanda Frederick MCN:[id number] DOB: 1971-03-24 DOA: 02/14/2021     PCP: Paulina Fusi, MD   Outpatient Specialists:   Orthopedics [facility] Endocrinology  Dr. Jeri Lager Patient arrived to ER on 02/14/21 at 1658 Referred by Attending Zadie Rhine, MD   Patient coming from: home Lives  With family    Chief Complaint:   Chief Complaint  Patient presents with   Leg Swelling   Shortness of Breath    HPI: Amanda Frederick is a 50 y.o. female with medical history significant of Fibromyalgia  Dm1, HTN, HLD, neuropathy, sp ORIF 6 wks ago  Presented with    lower extremity swelling and worsening shortness of breath 6 weeks ago patient undergone ankle fracture repair. She initially did well but afterwards her legs started to swell and she started to get more short of breath , reports for the past 2 wks had severe bilateral leg swelling abdominal girth increase, weight gain, she has been unable to lay flat for past 2 wks and has to sit up vertically to breathe. Has been checking her pulse ox at home and sometimes desat to 89% with exertion, Notes occasional chest pain worse with exertion and has been coming on occasion, no CP now Her left leg that was operated on also has been swollen a bit more than her right, but both are edematous  she went to see her orthopedics doctor today and was told to come to emergency department to get evaluation for DVT/PE. 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 She is type I diabetic and her blood sugars been under good control.  She uses a pump .   Notes her urine has been occasionally dark and frothy She has been eating well   Infectious risk factors:  Reports  shortness of breath,         Initial COVID TEST  in house  PCR testing  Pending  Lab Results  Component Value Date   SARSCOV2NAA NEGATIVE 01/04/2021   SARSCOV2NAA POSITIVE (A) 03/02/2020     Regarding pertinent Chronic problems:       HTN on lisinopril, was mainly taking it for proteinuria     DM 1 -  Lab Results  Component Value Date   HGBA1C 7.5 (H) 01/04/2021   on insulin pump   While in ER: Attempted to get Doppler study but was unable to can repeat in the morning. Attempted to obtain CTA when patient lie down flat her shortness of breath became severe and she 'turned blue" Patient was started on full dose Lovenox and brought back Presentation more consistent fluid overload although patient has no history of CHF in the past.  No echogram in the system.  Was given a dose of lasix   Noted mildly elevated troponins and BNP. Patient is persistently hypertensive and tachycardic   ED Triage Vitals  Enc Vitals Group     BP 02/14/21 1852 (!) 159/109     Pulse Rate 02/14/21 1852 (!) 116     Resp 02/14/21 1852 20     Temp 02/14/21 1852 98.1 F (36.7 C)     Temp Source 02/14/21 1852 Oral     SpO2 02/14/21 1852 93 %     Weight 02/14/21 2334 170 lb (77.1 kg)     Height 02/14/21 2334 5\' 6"  (1.676 m)     Head Circumference --      Peak Flow --      Pain Score 02/14/21  1858 6     Pain Loc --      Pain Edu? --      Excl. in GC? --   TMAX(24)@     _________________________________________ Significant initial  Findings: Abnormal Labs Reviewed  BASIC METABOLIC PANEL - Abnormal; Notable for the following components:      Result Value   Glucose, Bld 235 (*)    All other components within normal limits  CBC WITH DIFFERENTIAL/PLATELET - Abnormal; Notable for the following components:   RBC 5.60 (*)    Hemoglobin 15.1 (*)    HCT 48.3 (*)    All other components within normal limits  BRAIN NATRIURETIC PEPTIDE - Abnormal; Notable for the following components:   B Natriuretic Peptide 624.2 (*)    All other components within normal limits  CBG MONITORING, ED - Abnormal; Notable for the following components:   Glucose-Capillary 152 (*)    All other components within normal limits  TROPONIN I (HIGH SENSITIVITY) -  Abnormal; Notable for the following components:   Troponin I (High Sensitivity) 27 (*)    All other components within normal limits  TROPONIN I (HIGH SENSITIVITY) - Abnormal; Notable for the following components:   Troponin I (High Sensitivity) 24 (*)    All other components within normal limits   ____________________________________________ Ordered    CXR - fluid overload    _________________________ Troponin 24-25 ECG: Ordered Personally reviewed by me showing: HR : 117 Rhythm:  Sinus tachycardia    , no evidence of ischemic changes QTC 400   The recent clinical data is shown below. Vitals:   02/14/21 2300 02/14/21 2315 02/14/21 2334 02/14/21 2345  BP: (!) 157/117 (!) 159/112  (!) 157/114  Pulse: (!) 110 (!) 110  (!) 114  Resp: 20 18  20   Temp:      TempSrc:      SpO2: 100% 97%  99%  Weight:   77.1 kg   Height:   5\' 6"  (1.676 m)     WBC     Component Value Date/Time   WBC 9.3 02/14/2021 1859   LYMPHSABS 1.4 02/14/2021 1859   MONOABS 0.7 02/14/2021 1859   EOSABS 0.0 02/14/2021 1859   BASOSABS 0.0 02/14/2021 1859    Lactic Acid, Venous    Component Value Date/Time   LATICACIDVEN 1.5 03/02/2020 1933       UA   ordered       _______________________________________________ Hospitalist was called for admission for fluid overload   The following Work up has been ordered so far:  Orders Placed This Encounter  Procedures   Resp Panel by RT-PCR (Flu A&B, Covid) Nasopharyngeal Swab   DG Chest 2 View   CT Angio Chest PE W and/or Wo Contrast   Basic metabolic panel   CBC with Differential   Protime-INR   Brain natriuretic peptide   Consult for [facility] Admission   Airborne and Contact precautions   CBG monitoring, ED   EKG 12-Lead   VAS [place] LOWER EXTREMITY VENOUS (DVT) (ONLY [facility] & [facility])     Following Medications were ordered in ER: Medications  furosemide (LASIX) injection 20 mg (0 mg Intravenous Hold 02/14/21 2325)  enoxaparin (LOVENOX)  injection 80 mg (80 mg Subcutaneous Given 02/14/21 2206)  morphine 4 MG/ML injection 4 mg (4 mg Intravenous Given 02/14/21 2121)  nitroGLYCERIN (NITROGLYN) 2 % ointment 1 inch (1 inch Topical Given 02/14/21 2326)    OTHER Significant initial  Findings:  labs showing:  Recent Labs  Lab 02/14/21 1859  NA 139  K 4.3  CO2 24  GLUCOSE 235*  BUN 18  CREATININE 0.93  CALCIUM 9.2    Cr   stable,    Lab Results  Component Value Date   CREATININE 0.93 02/14/2021   CREATININE 0.70 01/04/2021   CREATININE 0.53 03/07/2020    No results for input(s): AST, ALT, ALKPHOS, BILITOT, PROT, ALBUMIN in the last 168 hours. Lab Results  Component Value Date   CALCIUM 9.2 02/14/2021   PHOS 3.5 03/07/2020       Plt: Lab Results  Component Value Date   PLT 296 02/14/2021     COVID-19 Labs  No results for input(s): DDIMER, FERRITIN, LDH, CRP in the last 72 hours.  Lab Results  Component Value Date   SARSCOV2NAA NEGATIVE 01/04/2021   SARSCOV2NAA POSITIVE (A) 03/02/2020    Recent Labs  Lab 02/14/21 1859  WBC 9.3  NEUTROABS 7.1  HGB 15.1*  HCT 48.3*  MCV 86.3  PLT 296    HG/HCT Up from baseline see below    Component Value Date/Time   HGB 15.1 (H) 02/14/2021 1859   HCT 48.3 (H) 02/14/2021 1859   MCV 86.3 02/14/2021 1859       Cardiac Panel (last 3 results) No results for input(s): CKTOTAL, CKMB, TROPONINI, RELINDX in the last 72 hours.   BNP (last 3 results) Recent Labs    02/14/21 2121  BNP 624.2*     DM  labs:  HbA1C: Recent Labs    03/02/20 2126 01/04/21 1304  HGBA1C 9.7* 7.5*       CBG (last 3)  Recent Labs    02/14/21 2120  GLUCAP 152*    Cultures:    Component Value Date/Time   SDES BLOOD LEFT ANTECUBITAL 03/02/2020 1935   SPECREQUEST  03/02/2020 1935    BOTTLES DRAWN AEROBIC AND ANAEROBIC Blood Culture adequate volume   CULT  03/02/2020 1935    NO GROWTH 5 DAYS Performed at [facility], [address], [place], [place] [address]     REPTSTATUS 03/07/2020 FINAL 03/02/2020 1935     Radiological Exams on Admission: DG Chest 2 View  Result Date: 02/14/2021 CLINICAL DATA:  Shortness of breath, bilateral lower extremity edema EXAM: CHEST - 2 VIEW COMPARISON:  03/02/2020 FINDINGS: The heart is top normal in size. Pulmonary vascular congestion and suspected mild interstitial edema. Moderate left and small right pleural effusions. Associated lower lobe opacities, likely atelectasis. No pneumothorax. IMPRESSION: Suspected mild interstitial edema. Moderate left and small right pleural effusions. Associated lower lobe opacities, likely atelectasis. Electronically Signed   By: Charline Bills M.D.   On: 02/14/2021 19:41   XR Ankle Complete Left  Result Date: 02/14/2021 3 views left ankle show a healing bimalleolar ankle fracture status post open reduction/internal fixation.  The mortise is congruent.  _______________________________________________________________________________________________________ Latest  Blood pressure (!) 157/114, pulse (!) 114, temperature 98.4 F (36.9 C), temperature source Oral, resp. rate 20, height 5\' 6"  (1.676 m), weight 77.1 kg, SpO2 99 %.   Review of Systems:    Pertinent positives include:   fatigue,   shortness of breath at rest. dyspnea on exertion  Bilateral lower extremity swelling  Constitutional:  No weight loss, night sweats, Fevers, chills,weight loss  HEENT:  No headaches, Difficulty swallowing,Tooth/dental problems,Sore throat,  No sneezing, itching, ear ache, nasal congestion, post nasal drip,  Cardio-vascular:  No chest pain, Orthopnea, PND, anasarca, dizziness, palpitations.no  GI:  No heartburn, indigestion,  abdominal pain, nausea, vomiting, diarrhea, change in bowel habits, loss of appetite, melena, blood in stool, hematemesis Resp:   No excess mucus, no productive cough, No non-productive cough, No coughing up of blood.No change in color of mucus.No wheezing. Skin:  no  rash or lesions. No jaundice GU:  no dysuria, change in color of urine, no urgency or frequency. No straining to urinate.  No flank pain.  Musculoskeletal:  No joint pain or no joint swelling. No decreased range of motion. No back pain.  Psych:  No change in mood or affect. No depression or anxiety. No memory loss.  Neuro: no localizing neurological complaints, no tingling, no weakness, no double vision, no gait abnormality, no slurred speech, no confusion  All systems reviewed and apart from HOPI all are negative _______________________________________________________________________________________________ Past Medical History:   Past Medical History:  Diagnosis Date   AC (acromioclavicular) joint bone spurs    Anemia    Anxiety    Arthritis    Bronchitis    Bulging lumbar disc    Bursitis    Complication of anesthesia    "has died 3 separate times under anesthesia"   Depression    Diabetes (HCC)    Environmental allergies    Fibromyalgia    High blood pressure    High cholesterol    History of COVID-19 01/2020   was hospitalized   History of endometriosis    History of uterine fibroid    Migraines    Neuropathy    eyes , hands and feet    Pinched nerve    Pneumonia    Seizure (HCC)    after eye test only   Spondylosis    Tachycardia    after COVID   Tendonitis    Vision abnormalities      Past Surgical History:  Procedure Laterality Date   NASAL SINUS SURGERY     ORIF ANKLE FRACTURE Left 01/04/2021   Procedure: OPEN REDUCTION INTERNAL FIXATION (ORIF) LEFT BIMALLEOLAR ANKLE FRACTURE;  Surgeon: Kathryne Hitch, MD;  Location: [facility];  Service: Orthopedics;  Laterality: Left;   TUBAL LIGATION     VAGINAL HYSTERECTOMY      Social History:  Ambulatory  fracture boot     reports that she has never smoked. She has never used smokeless tobacco. She reports previous alcohol use. She reports that she does not use drugs.     Family History:   Family  History  Problem Relation Age of Onset   Anemia Mother    High Cholesterol Mother    Diabetes Mother    Arthritis Mother    ______________________________________________________________________________________________ Allergies: Allergies  Allergen Reactions   Other Hives    Antibiotic specific one unsure but has tolerated antibiotic recently. Reaction was about 20 years ago.     Prior to Admission medications   Medication Sig Start Date End Date Taking? Authorizing Provider  albuterol (VENTOLIN HFA) 108 (90 Base) MCG/ACT inhaler Inhale 1 puff into the lungs every 6 (six) hours as needed for wheezing or shortness of breath.    [provider]  cholecalciferol (VITAMIN D) 1000 UNITS tablet Take 1,000 Units by mouth daily.    [provider]  doxycycline (VIBRA-TABS) 100 MG tablet Take 1 tablet (100 mg total) by mouth 2 (two) times daily. 01/07/21   Kathryne Hitch, MD  fish oil-omega-3 fatty acids 1000 MG capsule Take 2 g by mouth 2 (two) times daily.    [provider]  gabapentin (NEURONTIN)  100 MG capsule Take 1 capsule (100 mg total) by mouth 3 (three) times daily. 01/17/21   Kathryne Hitch, MD  glucose blood (BAYER CONTOUR NEXT TEST) test strip Test 8-10 times per day 8-10 lancets/day 06/14/13   Romero Belling, MD  HYDROcodone-acetaminophen (NORCO/VICODIN) 5-325 MG tablet Take 1-2 tablets by mouth every 6 (six) hours as needed for moderate pain. 02/14/21   Kathryne Hitch, MD  insulin aspart (NOVOLOG) 100 UNIT/ML injection For use in pump, total of 120 units per day 06/14/13   Romero Belling, MD  Insulin Infusion Pump Supplies (PARADIGM QUICK-SET 32" ) MISC 1 Device by Does not apply route continuous. Vovolog    [provider]  lisinopril (ZESTRIL) 20 MG tablet Take 20 mg by mouth daily.    [provider]  oxyCODONE (OXY IR/ROXICODONE) 5 MG immediate release tablet Take 1 tablet (5 mg total) by mouth every 6 (six)  hours as needed for moderate pain (pain score 4-6). 02/04/21   Kathryne Hitch, MD  pantoprazole (PROTONIX) 40 MG tablet Take 1 tablet (40 mg total) by mouth daily for 30 doses. Patient not taking: Reported on 01/03/2021 03/07/20 04/06/20  Elgergawy, Leana Roe, MD  tiZANidine (ZANAFLEX) 4 MG tablet Take 1 tablet (4 mg total) by mouth every 8 (eight) hours as needed for muscle spasms. 01/05/21   Kathryne Hitch, MD  DULoxetine (CYMBALTA) 60 MG capsule Take 1 capsule (60 mg total) by mouth daily. Patient not taking: No sig reported 04/22/13 01/04/21  Levert Feinstein, MD    ___________________________________________________________________________________________________ Physical Exam: Vitals with BMI 02/14/2021 02/14/2021 02/14/2021  Height - 5\' 6"  -  Weight - 170 lbs -  BMI - 27.45 -  Systolic 157 - 159  Diastolic - 112  Pulse 114 - 110     1. General:  in No  Acute distress   Chronically ill -appearing 2. Psychological: Alert and   Oriented 3. Head/ENT:   Moist   Mucous Membranes                          Head Non traumatic, neck supple                          Normal   Dentition                           Positive JVD 4. SKIN: normal  Skin turgor,  Skin clean Dry and intact no rash 5. Heart: Regular rate and rhythm no Murmur, no Rub or gallop 6. Lungs:  no wheezes some crackles   7. Abdomen: Soft,  non-tender, Non distended   obese  bowel sounds present 8. Lower extremities: no clubbing, cyanosis,  edema LEft >right Left leg wrapped post op 9. Neurologically Grossly intact, moving all 4 extremities equally   10. MSK: Normal range of motion    Chart has been reviewed  ______________________________________________________________________________________________  Assessment/Plan 50 y.o. female with medical history significant of Fibromyalgia  Dm1, HTN, HLD, neuropathy, sp ORIF 6 wks ago   Admitted for Dyspnea possible new CHF  Present on Admission:  Accelerated  hypertension - resume home meds, Lisinopril may need to add betablocker  Tachycardia - post covid -given known association of COVID and fibroid disease.  Will check TSH T4 and T3 check for possible hyperthyroidism obtain echogram As per records this been ongoing for over a month  HLD (hyperlipidemia) - check lipid panel   Fluid overload /dyspnea - obtain echo, continue with diuresis Cardiology consult PE less likely but contiue lovenox for now and obtain CTA when fluid status improves vs VQ scan  Acute respiratory failure with hypoxia - this patient has acute respiratory failure with Hypoxia and as documented by the presence of following: O2 saturatio< 90% on RA   Likely due to:  CHF exacerbation,  Provide O2 therapy and titrate as needed  Continuous pulse ox   check Pulse ox with ambulation prior to discharge   may need  TC consult for home O2 set up    CP - currently resolved troponin stable was given lovenox by ER will continue Obtain echo Given hx of Dm1 would benefit from ischemic work up, consult cardiology make sure p on aspirin  Dm1- continue pump Order sliding scale and diabetes coordinator consult Pt may administer insulin through her pump Other plan as per orders.  Dark urine and frothy - obtain UA, will need to check for protein uria, check albumin level  DVT prophylaxis:   Lovenox       Code Status:    Code Status: Prior FULL CODE  as per patient   I had personally discussed CODE STATUS with patient      Family Communication:   Family   at  Bedside Discussed with Husband     Disposition Plan:        To home once workup is complete and patient is stable   Following barriers for discharge:                                                   Will likely need home health, home O2, set up                           Will need consultants to evaluate patient prior to discharge                        Would benefit from PT/OT eval prior to DC  Ordered                                        Consults called:   email Cardiology  Admission status:  ED Disposition     ED Disposition  Admit   Condition  --   Comment  [facility] [[id number]  Level of Care: Progressive [102]  Admit to Progressive based on following criteria: CARDIOVASCULAR & THORACIC of moderate stability with acute coronary syndrome symptoms/low risk myocardial infarction/hypertensive urgency/arrhythmias/heart failure potentially compromising stability and stable post cardiovascular intervention patients.  [facility] if equivalent level of care is available:: No  Covid Evaluation: Asymptomatic Screening Protocol (No Symptoms)  Diagnosis: Accelerated hypertension [161096][[id number]]  Admitting Physician: Therisa DoyneUTOVA, Mekenzie Modeste [[id number]]  Attending Physician: Therisa DoyneUTOVA, Aeson Sawyers [[id number]]           Obs     Level of care   progressive  tele indefinitely please discontinue once patient no longer qualifies COVID-19 Labs    Precautions: admitted as  asymptomatic screening protocol     PPE:  Used by the provider:   N95  eye Goggles,  Gloves     Therisa Doyne 02/15/2021, 1:14 AM    [facility]     after 2 AM please page floor coverage PA If 7AM-7PM, please contact the day team taking care of the patient using [email or web address]   Patient was evaluated in the context of the global COVID-19 pandemic, which necessitated consideration that the patient might be at risk for infection with the SARS-CoV-2 virus that causes COVID-19. Institutional protocols and algorithms that pertain to the evaluation of patients at risk for COVID-19 are in a state of rapid change based on information released by regulatory bodies including the CDC and federal and state organizations. These policies and algorithms were followed during the patient's care.

## 2021-02-15 NOTE — ED Notes (Signed)
Attempted to call report

## 2021-02-15 NOTE — Progress Notes (Signed)
Inpatient Diabetes Program Recommendations  AACE/ADA: New Consensus Statement on Inpatient Glycemic Control (2015)  Target Ranges:  Prepandial:   less than 140 mg/dL      Peak postprandial:   less than 180 mg/dL (1-2 hours)      Critically ill patients:  140 - 180 mg/dL   Lab Results  Component Value Date   GLUCAP 129 (H) 02/15/2021   HGBA1C 6.8 (H) 02/15/2021    Review of Glycemic Control  Diabetes history: DM type 9 since 50 years old Outpatient Diabetes medications: T Slim insulin pump with PCP management Pump settings Basal rate 12A-12A      1.75 Correction/Sensitivity 1:10     1 unit drops glucose 10 points Carbohydrate coverage 1:10     1 unit for every 10 grams of carbs Target glucose 100  Current orders for Inpatient glycemic control: insulin pump order set  Insulin pump insertion site right upper quadrant/pump on timer to switch sites Husband brining extra pump supplies to bedside.  -   Pt to keep pump on and bolus for correction and meals -   Nursing staff to use hospital glucometer for CBG checks in addition to pts Dexcom CGM per current policy -   Nursing staff document in insulin pump flowsheet Qshift -   Nursing staff to sign and place at bedside the insulin pump contract and flowsheet   Thanks,  Christena Deem RN, MSN, BC-ADM Inpatient Diabetes Coordinator Team Pager [phone] (8a-5p)

## 2021-02-15 NOTE — Progress Notes (Signed)
BLE venous duplex has been completed.  Preliminary results given to Zoe, RN.  Results can be found under chart review under CV PROC. 02/15/2021 11:14 AM Berdine Rasmusson RVT, RDMS

## 2021-02-15 NOTE — Progress Notes (Signed)
Amanda Frederick is a 50 y.o. female with medical history significant of Fibromyalgia  Dm1, HTN, HLD, neuropathy, sp ORIF 6 wks ago   Presented with lower extremity swelling and worsening shortness of breath 6 weeks ago patient undergone ankle fracture repair. She initially did well but afterwards her legs started to swell and she started to get more short of breath , reports for the past 2 wks had severe bilateral leg swelling abdominal girth increase, weight gain, she has been unable to lay flat for past 2 wks and has to sit up vertically to breathe. Has been checking her pulse ox at home and sometimes desat to 89% with exertion, Notes occasional chest pain worse with exertion and has been coming on occasion, no CP now. Her left leg that was operated on also has been swollen a bit more than her right, but both are edematous  she went to see her orthopedics doctor today and was told to come to emergency department to get evaluation for DVT/PE. 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 She is type I diabetic and her blood sugars been under good control.  She uses a pump.  Patient was admitted by Dr. Adela Glimpse due to concern for acute CHF.  An echocardiogram has been ordered and it is pending.  She has received IV Lasix.  Please refer to H&P dictated by my partner Dr. Adela Glimpse on 02/15/21 for further details of the assessment and plan.

## 2021-02-15 NOTE — Progress Notes (Addendum)
  Echocardiogram 2D Echocardiogram has been performed. [place] Cardiologist notified.  Amanda Frederick 02/15/2021, 1:48 PM

## 2021-02-15 NOTE — ED Notes (Signed)
Got patient cleaned up placed new sheets on the bed placed a brief and a external cath patient is resting with call bell in reach and family at bedside

## 2021-02-15 NOTE — Progress Notes (Signed)
ANTICOAGULATION CONSULT NOTE - Initial Consult  Pharmacy Consult for Lovenox Indication:  r/o VTE  Allergies  Allergen Reactions   Other Hives    Antibiotic specific one unsure but has tolerated antibiotic recently. Reaction was about 20 years ago.    Patient Measurements: Height: 5\' 6"  (167.6 cm) Weight: 77.1 kg (170 lb) IBW/kg (Calculated) : 59.3  Vital Signs: Temp: 98.4 F (36.9 C) (07/21 2037) Temp Source: Oral (07/21 2037) BP: 144/108 (07/22 0000) Pulse Rate: 108 (07/22 0000)  Labs: Recent Labs    02/14/21 1859 02/14/21 2121  HGB 15.1*  --   HCT 48.3*  --   PLT 296  --   LABPROT 14.0  --   INR 1.1  --   CREATININE 0.93  --   TROPONINIHS 27* 24*    Estimated Creatinine Clearance: 75.9 mL/min (by C-G formula based on SCr of 0.93 mg/dL).   Medical History: Past Medical History:  Diagnosis Date   AC (acromioclavicular) joint bone spurs    Anemia    Anxiety    Arthritis    Bronchitis    Bulging lumbar disc    Bursitis    Complication of anesthesia    "has died 3 separate times under anesthesia"   Depression    Diabetes (HCC)    Environmental allergies    Fibromyalgia    High blood pressure    High cholesterol    History of COVID-19 01/2020   was hospitalized   History of endometriosis    History of uterine fibroid    Migraines    Neuropathy    eyes , hands and feet    Pinched nerve    Pneumonia    Seizure (HCC)    after eye test only   Spondylosis    Tachycardia    after COVID   Tendonitis    Vision abnormalities     Assessment: 50yo female c/o SOB and BLE edema/pain x2wk, awaiting imaging to r/o VTE, to begin Lovenox.  Goal of Therapy:  Anti-Xa level 0.6-1 units/ml 4hrs after LMWH dose given Monitor platelets by anticoagulation protocol: Yes   Plan:  Lovenox 80mg  SQ Q12H. Monitor CBC.  50yo, PharmD, BCPS  02/15/2021,12:48 AM

## 2021-02-15 NOTE — Consult Note (Addendum)
Cardiology Consultation:   Patient ID: Amanda Frederick; [id number]; October 26, 1970   Admit date: 02/14/2021 Date of Consult: 02/15/2021  Primary Care Provider: Paulina Fusi, MD Primary Cardiologist: New to [facility]  Patient Profile:   Amanda Frederick is a 50 y.o. female with a hx of fibromyalgia, DM1, HTN, HLD, neuropathy, s/p recent ORIF 6 weeks ago for ankle fracture repair who is being seen today for the evaluation of CHF at the request of Dr. Margo Aye.   History of Present Illness:   Amanda Frederick is a 50yo F with a hx as stated above who presented to [facility] 02/14/21 with worsening LE edema and SOB. She reports that she underwent ankle fracture repair approximately 6 weeks ago and was initially doing well after discharge however slowly developed LE edema,  progressive SOB, abdominal swelling and orthopnea. She states that she has attempted to sleep in her bed however is unable to lie flat therefore she has been sleeping in a recliner in the living room. She has notice worsening abdominal girth as well. She states that she can longer wear her own underwent as they are now too tight. She does not know a baseline, good weight for herself. She is normally able to ambulate without SOB however since her surgery, this too has become more difficult. She states intermittent chest pressure, mostly anterior with radiation to the left. She has no prior hx of CAD. She denies tobacco use. Family hx of MI with death in her grandfather.    She was seen by her ortho team OP yesterday wit concern for DVT/PE therefore it was recommended that she proceed to the ED for urgent evaluation. On ED arrival, she was found to be tachycardic. She states that her baseline HR runs in the 100's. Cr normal at 0.73. Albumin noted to be low at 3.2. HsT at 27>>24>>23>>23. Not felt to be consistent with ACS due to flat trend. Hb stable. CXR with suspected mild interstitial edema, moderate left and small right pleural effusions. She was given IV  Lasix ans started on 40mg  BID. Per chart review, CTA was apparently ordered however she was unable to lie flat for study. Echocardiogram has been ordered.    Past Medical History:  Diagnosis Date   AC (acromioclavicular) joint bone spurs    Anemia    Anxiety    Arthritis    Bronchitis    Bulging lumbar disc    Bursitis    Complication of anesthesia    "has died 3 separate times under anesthesia"   Depression    Diabetes (HCC)    Environmental allergies    Fibromyalgia    High blood pressure    High cholesterol    History of COVID-19 01/2020   was hospitalized   History of endometriosis    History of uterine fibroid    Migraines    Neuropathy    eyes , hands and feet    Pinched nerve    Pneumonia    Seizure (HCC)    after eye test only   Spondylosis    Tachycardia    after COVID   Tendonitis    Vision abnormalities     Past Surgical History:  Procedure Laterality Date   NASAL SINUS SURGERY     ORIF ANKLE FRACTURE Left 01/04/2021   Procedure: OPEN REDUCTION INTERNAL FIXATION (ORIF) LEFT BIMALLEOLAR ANKLE FRACTURE;  Surgeon: 03/06/2021, MD;  Location: [facility];  Service: Orthopedics;  Laterality: Left;   TUBAL LIGATION  VAGINAL HYSTERECTOMY       Prior to Admission medications   Medication Sig Start Date End Date Taking? Authorizing Provider  albuterol (VENTOLIN HFA) 108 (90 Base) MCG/ACT inhaler Inhale 1 puff into the lungs every 6 (six) hours as needed for wheezing or shortness of breath.    [provider]  cholecalciferol (VITAMIN D) 1000 UNITS tablet Take 1,000 Units by mouth daily.    [provider]  doxycycline (VIBRA-TABS) 100 MG tablet Take 1 tablet (100 mg total) by mouth 2 (two) times daily. 01/07/21   Kathryne HitchBlackman, Christopher Y, MD  fish oil-omega-3 fatty acids 1000 MG capsule Take 2 g by mouth 2 (two) times daily.    [provider]  gabapentin (NEURONTIN) 100 MG capsule Take 1 capsule (100 mg total) by mouth 3 (three)  times daily. 01/17/21   Kathryne HitchBlackman, Christopher Y, MD  glucose blood (BAYER CONTOUR NEXT TEST) test strip Test 8-10 times per day 8-10 lancets/day 06/14/13   Romero BellingEllison, Sean, MD  HYDROcodone-acetaminophen (NORCO/VICODIN) 5-325 MG tablet Take 1-2 tablets by mouth every 6 (six) hours as needed for moderate pain. 02/14/21   Kathryne HitchBlackman, Christopher Y, MD  insulin aspart (NOVOLOG) 100 UNIT/ML injection For use in pump, total of 120 units per day 06/14/13   Romero BellingEllison, Sean, MD  Insulin Infusion Pump Supplies (PARADIGM QUICK-SET 32" 6MM) MISC 1 Device by Does not apply route continuous. Vovolog    [provider]  lisinopril (ZESTRIL) 20 MG tablet Take 20 mg by mouth daily.    [provider]  oxyCODONE (OXY IR/ROXICODONE) 5 MG immediate release tablet Take 1 tablet (5 mg total) by mouth every 6 (six) hours as needed for moderate pain (pain score 4-6). 02/04/21   Kathryne HitchBlackman, Christopher Y, MD  pantoprazole (PROTONIX) 40 MG tablet Take 1 tablet (40 mg total) by mouth daily for 30 doses. Patient not taking: Reported on 01/03/2021 03/07/20 04/06/20  Elgergawy, Leana Roeawood S, MD  tiZANidine (ZANAFLEX) 4 MG tablet Take 1 tablet (4 mg total) by mouth every 8 (eight) hours as needed for muscle spasms. 01/05/21   Kathryne HitchBlackman, Christopher Y, MD  DULoxetine (CYMBALTA) 60 MG capsule Take 1 capsule (60 mg total) by mouth daily. Patient not taking: No sig reported 04/22/13 01/04/21  Levert FeinsteinYan, Yijun, MD    Inpatient Medications: Scheduled Meds:  aspirin EC  81 mg Oral Daily   aspirin EC  81 mg Oral Once   enoxaparin (LOVENOX) injection  80 mg Subcutaneous Q12H   furosemide  40 mg Intravenous Q12H   insulin aspart  0-9 Units Subcutaneous Q4H   insulin pump   Subcutaneous Q4H   lisinopril  20 mg Oral Daily   pantoprazole  40 mg Oral Daily   sodium chloride flush  3 mL Intravenous Q12H   Continuous Infusions:  sodium chloride     PRN Meds: sodium chloride, acetaminophen **OR** acetaminophen, HYDROcodone-acetaminophen,  oxyCODONE, sodium chloride flush, tiZANidine  Allergies:    Allergies  Allergen Reactions   Other Hives    Antibiotic specific one unsure but has tolerated antibiotic recently. Reaction was about 20 years ago.    Social History:   Social History   Socioeconomic History   Marital status: Divorced    Spouse name: Leory Plowmanlton   Number of children: 3   Years of education: college   Highest education level: Not on file  Occupational History    Comment: Home maker  Tobacco Use   Smoking status: Never   Smokeless tobacco: Never  Vaping Use  Vaping Use: Not on file  Substance and Sexual Activity   Alcohol use: Not Currently    Comment: special occassions   Drug use: No   Sexual activity: Not on file  Other Topics Concern   Not on file  Social History Narrative   Patient lives at home with her husband Leory Plowman. 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 Patient drinks 4-6 caffeine drinks daily.   Right handed.         Social Determinants of Health   Financial Resource Strain: Not on file  Food Insecurity: Not on file  Transportation Needs: Not on file  Physical Activity: Not on file  Stress: Not on file  Social Connections: Not on file  Intimate Partner Violence: Not on file    Family History:   Family History  Problem Relation Age of Onset   Anemia Mother    High Cholesterol Mother    Diabetes Mother    Arthritis Mother    Family Status:  Family Status  Relation Name Status   Mother Pierce Crane Alive   Father  Deceased at age 59    ROS:  Please see the history of present illness.  All other ROS reviewed and negative.     Physical Exam/Data:   Vitals:   02/15/21 0830 02/15/21 0900 02/15/21 0930 02/15/21 1039  BP: (!) 138/99 (!) 131/94 123/87 (!) 128/101  Pulse: (!) 115 (!) 113 (!) 114   Resp: (!) 22 (!) 32 (!) 23   Temp:      TempSrc:      SpO2: 98% 98% 97%   Weight:      Height:        Intake/Output Summary (Last 24 hours) at 02/15/2021  1154 Last data filed at 02/15/2021 0331 Gross per 24 hour  Intake 120 ml  Output 1000 ml  Net -880 ml   Filed Weights   02/14/21 2334  Weight: 77.1 kg   Body mass index is 27.44 kg/m.   General: Well developed, well nourished, NAD Neck: Negative for carotid bruits. No JVD Lungs: Diminished in bilateral lower lobes. Breathing is unlabored. Cardiovascular: RRR with S1 S2. No murmurs Abdomen: Soft, non-tender, non-distended. No obvious abdominal masses. Extremities: No edema. No clubbing or cyanosis. DP/PT pulses 2+ bilaterally Neuro: Alert and oriented. No focal deficits. No facial asymmetry. MAE spontaneously. Psych: Responds to questions appropriately with normal affect.    EKG:  The EKG was personally reviewed and demonstrates:  02/14/21 ST with evidence of old anterior MI Telemetry:  Telemetry was personally reviewed and demonstrates:  02/15/21 ST with HR 100's   Relevant CV Studies:  Echocardiogram: Pending   Laboratory Data:  Chemistry Recent Labs  Lab 02/14/21 1859 02/15/21 0409  NA 139 142  K 4.3 3.6  CL 107 107  CO2 24 24  GLUCOSE 235* 143*  BUN 18 15  CREATININE 0.93 0.73  CALCIUM 9.2 8.7*  GFRNONAA >60 >60  ANIONGAP 8 11    Total Protein  Date Value Ref Range Status  02/15/2021 5.7 (L) 6.5 - 8.1 g/dL Final   Albumin  Date Value Ref Range Status  02/15/2021 3.2 (L) 3.5 - 5.0 g/dL Final   AST  Date Value Ref Range Status  02/15/2021 26 15 - 41 U/L Final   ALT  Date Value Ref Range Status  02/15/2021 34 0 - 44 U/L Final   Alkaline Phosphatase  Date Value Ref Range Status  02/15/2021 183 (H) 38 - 126 U/L Final  Total Bilirubin  Date Value Ref Range Status  02/15/2021 1.0 0.3 - 1.2 mg/dL Final   Hematology Recent Labs  Lab 02/14/21 1859 02/15/21 0249 02/15/21 0409  WBC 9.3 7.3 7.4  RBC 5.60* 5.51* 5.26*  HGB 15.1* 14.5 14.3  HCT 48.3* 48.0* 45.2  MCV 86.3 87.1 85.9  MCH 27.0 26.3 27.2  MCHC 31.3 30.2 31.6  RDW 13.6 13.7 13.7  PLT  296 286 260   Cardiac EnzymesNo results for input(s): TROPONINI in the last 168 hours. No results for input(s): TROPIPOC in the last 168 hours.  BNP Recent Labs  Lab 02/14/21 2121  BNP 624.2*    DDimer  Recent Labs  Lab 02/15/21 0409  DDIMER 0.90*   TSH:  Lab Results  Component Value Date   TSH 3.196 02/15/2021   Lipids: Lab Results  Component Value Date   CHOL 153 02/15/2021   HDL 44 02/15/2021   LDLCALC 97 02/15/2021   TRIG 60 02/15/2021   CHOLHDL 3.5 02/15/2021   HgbA1c: Lab Results  Component Value Date   HGBA1C 6.8 (H) 02/15/2021    Radiology/Studies:  DG Chest 2 View  Result Date: 02/14/2021 CLINICAL DATA:  Shortness of breath, bilateral lower extremity edema EXAM: CHEST - 2 VIEW COMPARISON:  03/02/2020 FINDINGS: The heart is top normal in size. Pulmonary vascular congestion and suspected mild interstitial edema. Moderate left and small right pleural effusions. Associated lower lobe opacities, likely atelectasis. No pneumothorax. IMPRESSION: Suspected mild interstitial edema. Moderate left and small right pleural effusions. Associated lower lobe opacities, likely atelectasis. Electronically Signed   By: Charline Bills M.D.   On: 02/14/2021 19:41   XR Ankle Complete Left  Result Date: 02/14/2021 3 views left ankle show a healing bimalleolar ankle fracture status post open reduction/internal fixation.  The mortise is congruent.  VAS [place] LOWER EXTREMITY VENOUS (DVT) (ONLY [facility] & [facility])  Result Date: 02/15/2021  Lower Venous DVT Study Patient Name:  Amanda Frederick  Date of Exam:   02/15/2021 Medical Rec #: [id number]      Accession #:    [id number] Date of Birth: March 14, 1971      Patient Gender: F Patient Age:   49Y Exam [facility]  [facility] Procedure:      VAS [place] LOWER EXTREMITY VENOUS (DVT) Referring Phys: [id number] ANASTASSIA DOUTOVA --------------------------------------------------------------------------------  Indications: Edema. Other Indications: LLE bimalleolar  fracture 6 weeks post surgery. Comparison Study: No previous exams Performing Technologist: Jody Hill RVT, RDMS  Examination Guidelines: A complete evaluation includes B-mode imaging, spectral Doppler, color Doppler, and power Doppler as needed of all accessible portions of each vessel. Bilateral testing is considered an integral part of a complete examination. Limited examinations for reoccurring indications may be performed as noted. The reflux portion of the exam is performed with the patient in reverse Trendelenburg.  +---------+---------------+---------+-----------+----------+--------------+ RIGHT    CompressibilityPhasicitySpontaneityPropertiesThrombus Aging +---------+---------------+---------+-----------+----------+--------------+ CFV      Full           Yes      Yes                                 +---------+---------------+---------+-----------+----------+--------------+ SFJ      Full                                                        +---------+---------------+---------+-----------+----------+--------------+  FV Prox  Full           Yes      Yes                                 +---------+---------------+---------+-----------+----------+--------------+ FV Mid   Full           Yes      Yes                                 +---------+---------------+---------+-----------+----------+--------------+ FV DistalFull           Yes      Yes                                 +---------+---------------+---------+-----------+----------+--------------+ PFV      Full                                                        +---------+---------------+---------+-----------+----------+--------------+ POP      Full           Yes      Yes                                 +---------+---------------+---------+-----------+----------+--------------+ PTV      Full                                                         +---------+---------------+---------+-----------+----------+--------------+ PERO     Full                                                        +---------+---------------+---------+-----------+----------+--------------+   +---------+---------------+---------+-----------+----------+--------------+ LEFT     CompressibilityPhasicitySpontaneityPropertiesThrombus Aging +---------+---------------+---------+-----------+----------+--------------+ CFV      Full           Yes      Yes                                 +---------+---------------+---------+-----------+----------+--------------+ SFJ      Full                                                        +---------+---------------+---------+-----------+----------+--------------+ FV Prox  Full           Yes      Yes                                 +---------+---------------+---------+-----------+----------+--------------+ FV Mid   Full  Yes      Yes                                 +---------+---------------+---------+-----------+----------+--------------+ FV DistalFull           Yes      Yes                                 +---------+---------------+---------+-----------+----------+--------------+ PFV      Full                                                        +---------+---------------+---------+-----------+----------+--------------+ POP      Full           Yes      Yes                                 +---------+---------------+---------+-----------+----------+--------------+ PTV      Full                                                        +---------+---------------+---------+-----------+----------+--------------+ PERO     Full                                                        +---------+---------------+---------+-----------+----------+--------------+     Summary: BILATERAL: - No evidence of deep vein thrombosis seen in the lower extremities, bilaterally. - No evidence of  superficial venous thrombosis in the lower extremities, bilaterally. -No evidence of popliteal cyst, bilaterally.   *See table(s) above for measurements and observations.    Preliminary     Assessment and Plan:   1. SOB/LE edema: -Presented from ortho office with a several week hx of LE edema, orthopnea, tachycardia, increased abdominal girth after ankle fracture repair. HsT at 27>>24>>23>>23. Not felt to be consistent with ACS due to flat trend. Hb stable. CXR with suspected mild interstitial edema, moderate left and small right pleural effusions. She was given IV Lasix and started on 40mg  BID. Per chart review, CTA was apparently ordered however she was unable to lie flat for study. Echocardiogram has been ordered. D Dimer 0.90. High suspicion for PE given the above risk factors and symptoms. Will be able to gather more information about the etiology of her symptoms with echocardiogram results. For now, would continue with IV Lasix and re-attempt CTA for more definitive evaluation.   2. Chest pain: -Reports intermittent chest pressure, more with increased WOB. HsT found to be flat, not consistent with ACS. She has no prior hx of CAD and reports no chest pain symptoms prior to surgery. No atherosclerosis noted on prior CT scans.  3. HLD: -LDL this admission noted at 76 -Reports that she has been following this with her PCP and is on Omega 3  4. HTN: -Elevated, 128/101>>123/87>>131/94 -Not currently  on antihypertensives  -Hold off on BP medications right now for BP and HR until CTA and echo result    For questions or updates, please contact [facility] Please consult [email or web address] for contact info under Cardiology/STEMI.   SignedGeorgie Chard NP-C [facility] Pager: [phone] 02/15/2021 11:54 AM  ---------------------------------------------------------------------------------------------   History and all data above reviewed.  Patient examined.  I agree with the findings as  above.  Amanda Frederick is a 50 year old female with a history of diabetes mellitus type 1, hypertension, hyperlipidemia, recent ORIF of the left ankle 6 weeks ago for ankle fracture.  Cardiology consulted for heart failure.  She has had type 1 diabetes for many years, but denies any known cardiovascular complications thus far.  6 weeks ago she underwent ankle surgery for left ankle fracture, and shortly after discharge developed worsening bilateral lower extremity edema, progressive shortness of breath to the point where she cannot sleep in her bed and has to sleep in a recliner, abdominal swelling, and now has developed some central chest pressure.  She feels as if she is smothering.  She notes having COVID 19 infection approximately 1 year ago and at that time had no cardiovascular evaluation.  She does not recall being short of breath or volume overloaded at that time.  She was seen by outpatient orthopedic surgery team and due to lower extremity swelling and shortness of breath concern was raised for DVT/PE.  In the ED she was noted to be tachycardic more than her baseline which she notes a baseline heart rate of approximately 100 bpm.  High-sensitivity troponin in the 20s and flat x4.  ECG shows sinus tachycardia and Q waves suggestive of septal infarct, telemetry shows sinus tachycardia.  I have independently reviewed her chest x-ray which shows a moderate left pleural effusion, small right pleural effusion, and interstitial edema.  Independently reviewed the images from her echocardiogram which show an ejection fraction of approximately 25% with septal and apical wall motion abnormalities.  The myocardium appears scarred in this distribution.  RV size is grossly normal and RV function is mildly reduced.  Moderate to large left pleural effusion noted.  Trace pericardial fluid.  IVC is dilated and has reduced inspiratory collapse.  Constitutional: No acute distress Eyes: pupils equally round and reactive to  light, sclera non-icteric, normal conjunctiva and lids ENMT: normal dentition, moist mucous membranes Cardiovascular: regular rhythm, tachycardic rate, no murmurs. S1 and S2 normal. Radial pulses normal bilaterally. No jugular venous distention though challenging to assess due to body habitus.  Respiratory: clear to auscultation bilaterally GI : normal bowel sounds, soft and nontender. No distention.   MSK: extremities warm, well perfused.  1-2+ pitting edema to the knee.  NEURO: grossly nonfocal exam, moves all extremities. PSYCH: alert and oriented x 3, normal mood and affect.   All available labs, radiology testing, previous records reviewed. Agree with documented assessment and plan of my colleague as stated above with the following additions or changes:  Active Problems:   DM (diabetes mellitus), type 1 (HCC)   Essential hypertension   HLD (hyperlipidemia)   Accelerated hypertension   Tachycardia    Plan: Acute systolic heart failure-the timing of her onset of heart failure is indeterminate, but based on timing of symptoms may have occurred in the periprocedural or postprocedural timeframe of ankle surgery.  Echocardiogram suggestive of akinesis and possible scar of the ventricular septum as well as anterior and apical septum.  This suggests remote MI.  Findings today do not suggest  current concern for ACS.  Most likely etiology is coronary artery disease in the setting of risk factors of diabetes mellitus type 1, hypertension, hyperlipidemia.  Less likely but possible is myocarditis at the time of her COVID-19 infection last year.  I described to the patient that we will diurese to euvolemia and likely Monday plan for coronary angiography.  If no obstructive CAD is noted or is felt to be noncontributory to acute heart failure presentation, would then pursue cardiac MRI for evidence of prior myocarditis.  She remains volume overloaded and decompensated and heart failure.  Would continue to  diurese and would avoid adding beta-blockade for an additional 1 to 2 days until better compensated.  Primary service can consider CTA to exclude PE, however clinical picture is most consistent with acute decompensated heart failure.  I have described this plan for the patient and her friend in the room Sue Lush, and they are in agreement.  Length of Stay:  LOS: 0 days   Parke Poisson, MD [facility] 2:38 PM  02/15/2021

## 2021-02-16 DIAGNOSIS — I10 Essential (primary) hypertension: Secondary | ICD-10-CM | POA: Diagnosis not present

## 2021-02-16 LAB — BASIC METABOLIC PANEL
Anion gap: 8 (ref 5–15)
BUN: 14 mg/dL (ref 6–20)
CO2: 29 mmol/L (ref 22–32)
Calcium: 8.8 mg/dL — ABNORMAL LOW (ref 8.9–10.3)
Chloride: 101 mmol/L (ref 98–111)
Creatinine, Ser: 0.82 mg/dL (ref 0.44–1.00)
GFR, Estimated: >60 mL/min (ref >60)
Glucose, Bld: 102 mg/dL — ABNORMAL HIGH (ref 70–99)
Potassium: 3.7 mmol/L (ref 3.5–5.1)
Sodium: 138 mmol/L (ref 135–145)

## 2021-02-16 LAB — GLUCOSE, CAPILLARY
Glucose-Capillary: 115 mg/dL — ABNORMAL HIGH (ref 70–99)
Glucose-Capillary: 119 mg/dL — ABNORMAL HIGH (ref 70–99)
Glucose-Capillary: 120 mg/dL — ABNORMAL HIGH (ref 70–99)
Glucose-Capillary: 225 mg/dL — ABNORMAL HIGH (ref 70–99)
Glucose-Capillary: 72 mg/dL (ref 70–99)
Glucose-Capillary: 92 mg/dL (ref 70–99)

## 2021-02-16 LAB — URINE DRUGS OF ABUSE SCREEN W ALC, ROUTINE (REF LAB)
Amphetamines, Urine: NEGATIVE ng/mL
Barbiturate, Ur: NEGATIVE ng/mL
Benzodiazepine Quant, Ur: NEGATIVE ng/mL
Cannabinoid Quant, Ur: NEGATIVE ng/mL
Cocaine (Metab.): NEGATIVE ng/mL
Ethanol U, Quan: NEGATIVE %
Methadone Screen, Urine: NEGATIVE ng/mL
Opiate Quant, Ur: NEGATIVE ng/mL
Phencyclidine, Ur: NEGATIVE ng/mL
Propoxyphene, Urine: NEGATIVE ng/mL

## 2021-02-16 LAB — CBC
HCT: 45.6 % (ref 36.0–46.0)
Hemoglobin: 14.5 g/dL (ref 12.0–15.0)
MCH: 26.7 pg (ref 26.0–34.0)
MCHC: 31.8 g/dL (ref 30.0–36.0)
MCV: 84 fL (ref 80.0–100.0)
Platelets: 263 10*3/uL (ref 150–400)
RBC: 5.43 MIL/uL — ABNORMAL HIGH (ref 3.87–5.11)
RDW: 13.4 % (ref 11.5–15.5)
WBC: 8.4 10*3/uL (ref 4.0–10.5)
nRBC: 0 % (ref 0.0–0.2)

## 2021-02-16 LAB — PHOSPHORUS: Phosphorus: 4.2 mg/dL (ref 2.5–4.6)

## 2021-02-16 LAB — T3: T3, Total: 146 ng/dL (ref 71–180)

## 2021-02-16 LAB — MAGNESIUM: Magnesium: 1.8 mg/dL (ref 1.7–2.4)

## 2021-02-16 LAB — TROPONIN I (HIGH SENSITIVITY): Troponin I (High Sensitivity): 22 ng/L — ABNORMAL HIGH (ref <18)

## 2021-02-16 MED ORDER — ATORVASTATIN CALCIUM 80 MG PO TABS
80.0000 mg | ORAL_TABLET | Freq: Every day | ORAL | Status: DC
  Administered 2021-02-16 – 2021-02-21 (×6): 80 mg via ORAL
  Filled 2021-02-16 (×6): qty 1

## 2021-02-16 MED ORDER — POTASSIUM CHLORIDE CRYS ER 20 MEQ PO TBCR
20.0000 meq | EXTENDED_RELEASE_TABLET | Freq: Every day | ORAL | Status: AC
  Administered 2021-02-16 – 2021-02-19 (×4): 20 meq via ORAL
  Filled 2021-02-16 (×4): qty 1

## 2021-02-16 MED ORDER — MAGNESIUM OXIDE -MG SUPPLEMENT 400 (240 MG) MG PO TABS
200.0000 mg | ORAL_TABLET | Freq: Every day | ORAL | Status: AC
  Administered 2021-02-16 – 2021-02-19 (×4): 200 mg via ORAL
  Filled 2021-02-16 (×4): qty 1

## 2021-02-16 MED ORDER — ENOXAPARIN SODIUM 40 MG/0.4ML IJ SOSY
40.0000 mg | PREFILLED_SYRINGE | INTRAMUSCULAR | Status: DC
  Administered 2021-02-17 – 2021-02-21 (×4): 40 mg via SUBCUTANEOUS
  Filled 2021-02-16 (×5): qty 0.4

## 2021-02-16 NOTE — Progress Notes (Deleted)
   02/16/21 1100  Assess: MEWS Score  Temp 98.1 F (36.7 C)  BP 121/84  Pulse Rate (!) 108  ECG Heart Rate (!) 109  Resp (!) 21  Level of Consciousness Alert  SpO2 94 %  O2 Device Room Air  Assess: MEWS Score  MEWS Temp 0  MEWS Systolic 0  MEWS Pulse 1  MEWS RR 1  MEWS LOC 0  MEWS Score 2  MEWS Score Color Yellow  Treat  MEWS Interventions Administered scheduled meds/treatments  Take Vital Signs  Increase Vital Sign Frequency  Yellow: Q 2hr X 2 then Q 4hr X 2, if remains yellow, continue Q 4hrs  Notify: Charge Nurse/RN  Name of Charge Nurse/RN Notified Jessica, RN  Date Charge Nurse/RN Notified 02/16/21  Notify: Provider  Provider Name/Title Margo Aye, Carole/MD  Date Provider Notified 02/16/21  Time Provider Notified 1249  Document  Patient Outcome Other (Comment) (See new orders)  Progress note created (see row info) Yes

## 2021-02-16 NOTE — Progress Notes (Signed)
  Nursing can NOT Check Pulse Oximetry while ambulating as pt has fracture in her Left ankle and is not safe to be OOB. Pt need  PT consult in order to complete this task.

## 2021-02-16 NOTE — Progress Notes (Signed)
PROGRESS NOTE  Amanda Frederick WYS:[id number] DOB: 09-Feb-1971 DOA: 02/14/2021 PCP: Paulina Fusi, MD  HPI/Recap of past 24 hours:  Amanda Frederick is a 50 y.o. female with medical history significant of Fibromyalgia  Dm1, HTN, HLD, diabetic polyneuropathy, sp bimalleolar ankle fracture repair on 01/04/21 by Dr. Magnus Ivan who presented with lower extremity swelling and worsening shortness of breath x 2 weeks.  Reports for the past 2 wks had severe bilateral leg swelling, abdominal girth increase, weight gain, she has been unable to lay flat and has to sit up vertically to breathe. Has been checking her pulse ox at home and sometimes desat to 89% with exertion.  She endorses occasional chest pain worse with exertion.  She went to see her orthopedics surgeon on the day of presentation and was told to come to emergency department to get evaluation for DVT/PE. 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 She is type I diabetic and her blood sugars has been controlled on insulin pump.    2D echo 02/15/21 revealed:  Global hypokinesis with apical akinesis; overall severe LV  dysfunction.   2. Left ventricular ejection fraction, by estimation, is 25 to 30%. The  left ventricle has severely decreased function. The left ventricle  demonstrates regional wall motion abnormalities (see scoring  diagram/findings for description). Left ventricular  diastolic parameters are consistent with Grade III diastolic dysfunction  (restrictive). Elevated left atrial pressure. Large pleural effusion in the left lateral region   Seen by cardiology.  Plan to diurese and to take to the cath lab possibly on Monday.  Assessment/Plan: Active Problems:   DM (diabetes mellitus), type 1 (HCC)   Essential hypertension   HLD (hyperlipidemia)   Accelerated hypertension   Tachycardia   Acute systolic CHF (congestive heart failure) (HCC)  Acute combined systolic and diastolic  CHF Patient presented with bilateral lower extremity edema, dyspnea with minimal exertion, orthopnea, elevated BNP greater than 600, pulmonary edema and bilateral pleural effusions left greater than right on chest x-ray. 2D echo done on 02/15/2021 revealed LVEF 25 to 30%, restrictive diastolic filling pattern associated with wall motion abnormalities suggesting underlying ischemic heart disease.  Grade 3 diastolic dysfunction, global hypokinesis with apical akinesis, severe left ventricular dysfunction. She was seen by cardiology, appreciate assistance. Ongoing diuresing with plan for left and right heart cath likely next week. Currently on IV Lasix 40 mg twice daily Closely monitor blood pressure and electrolytes Replete electrolytes and adjust diuretics doses if indicated. Maintain MAP greater than 65 Continue strict I's and O's and daily weight. Net I&O -950 cc  Elevated troponin, likely demand ischemia in the setting of sinus tachycardia, pulmonary edema and bilateral pleural effusions. Troponin peaked at 27 and down trended.  Bilateral pleural effusions, left greater than right, likely cardiogenic Left pleural effusion appears moderate to large IR consult for possible left thoracentesis.  Sinus tachycardia, suspect secondary to pulmonary edema/pleural effusions TSH was normal Treat underlying conditions  Transient hypoxia reported by her husband when she sleeps She is currently on room air with O2 saturation of 98% Possible underlying OSA. Reassess after left thoracentesis and diuresing  Hyperlipidemia LDL 97, goal less than 70. Continue Lipitor 80 mg daily, started during this admission.  Type 1 diabetes with hyperlipidemia. Hemoglobin A1c 6.8 on 02/15/2021. Well-controlled Continue insulin pump.  Diabetic polyneuropathy Resume home regimen.  GERD Resume home Protonix  Fibromyalgia/recent left ankle surgery post repair Analgesic as needed along with bowel  regimen.  Code Status: Full code  Family Communication: Husband at bedside  Disposition Plan: Likely will discharge to home once cardiology signs off.   Consultants: Cardiology  Procedures: 2D echo  Antimicrobials: None  DVT prophylaxis: Subcu Lovenox daily  Status is: Inpatient    Dispo: The patient is from: Home.              Anticipated d/c is to: Home when cardiology signs off.               Patient currently not stable for discharge.  Ongoing diuresing.     Difficult to place patient, not applicable.        Objective: Vitals:   02/16/21 1204 02/16/21 1300 02/16/21 1500 02/16/21 1658  BP: (!) 133/98 112/73 110/69 112/83  Pulse:  (!) 112 (!) 105 (!) 104  Resp: 20 19 16 17   Temp: 98.1 F (36.7 C) 98 F (36.7 C) 97.7 F (36.5 C) 97.7 F (36.5 C)  TempSrc: Oral Oral Oral Oral  SpO2: 98% 98% 94% 97%  Weight:      Height:        Intake/Output Summary (Last 24 hours) at 02/16/2021 1704 Last data filed at 02/16/2021 1300 Gross per 24 hour  Intake 2780 ml  Output 2850 ml  Net -70 ml   Filed Weights   02/14/21 2334 02/16/21 0300  Weight: 77.1 kg 90.8 kg    Exam:  General: 50 y.o. year-old female well developed well nourished in no acute distress.  Alert and oriented x3. Cardiovascular: Tachycardic with no rubs or gallops.  No thyromegaly or JVD noted.   Respiratory: Mild rales at bases.  Normal inspiratory effort. Abdomen: Soft nontender nondistended with normal bowel sounds x4 quadrants. Musculoskeletal: 1+ edema in lower extremities bilaterally.  Left ankle in surgical dressing.   Skin: No ulcerative lesions noted or rashes, Psychiatry: Mood is appropriate for condition and setting   Data Reviewed: CBC: Recent Labs  Lab 02/14/21 1859 02/15/21 0249 02/15/21 0409 02/16/21 1225  WBC 9.3 7.3 7.4 8.4  NEUTROABS 7.1  --  5.5  --   HGB 15.1* 14.5 14.3 14.5  HCT 48.3* 48.0* 45.2 45.6  MCV 86.3 87.1 85.9 84.0  PLT 296 286 260 263   Basic  Metabolic Panel: Recent Labs  Lab 02/14/21 1859 02/15/21 0248 02/15/21 0409 02/16/21 1225  NA 139  --  142 138  K 4.3  --  3.6 3.7  CL 107  --  107 101  CO2 24  --  24 29  GLUCOSE 235*  --  143* 102*  BUN 18  --  15 14  CREATININE 0.93  --  0.73 0.82  CALCIUM 9.2  --  8.7* 8.8*  MG  --  2.0  --  1.8  PHOS  --  4.6  --  4.2   GFR: Estimated Creatinine Clearance: 93.2 mL/min (by C-G formula based on SCr of 0.82 mg/dL). Liver Function Tests: Recent Labs  Lab 02/15/21 0248 02/15/21 0409  AST 30 26  ALT 36 34  ALKPHOS 199* 183*  BILITOT 1.1 1.0  PROT 6.1* 5.7*  ALBUMIN 3.3* 3.2*   No results for input(s): LIPASE, AMYLASE in the last 168 hours. No results for input(s): AMMONIA in the last 168 hours. Coagulation Profile: Recent Labs  Lab 02/14/21 1859  INR 1.1   Cardiac Enzymes: Recent Labs  Lab 02/15/21 0248  CKTOTAL 337*   BNP (last 3 results) No results for input(s): PROBNP in the last 8760 hours.  HbA1C: Recent Labs    02/15/21 0013  HGBA1C 6.8*   CBG: Recent Labs  Lab 02/15/21 2015 02/16/21 0007 02/16/21 0439 02/16/21 0759 02/16/21 1201  GLUCAP 232* 72 120* 92 115*   Lipid Profile: Recent Labs    02/15/21 0249  CHOL 153  HDL 44  LDLCALC 97  TRIG 60  CHOLHDL 3.5   Thyroid Function Tests: Recent Labs    02/15/21 0017 02/15/21 0248  TSH 3.196  --   FREET4  --  1.44*   Anemia Panel: No results for input(s): VITAMINB12, FOLATE, FERRITIN, TIBC, IRON, RETICCTPCT in the last 72 hours. Urine analysis:    Component Value Date/Time   COLORURINE COLORLESS (A) 02/15/2021 0301   APPEARANCEUR CLEAR 02/15/2021 0301   LABSPEC 1.004 (L) 02/15/2021 0301   PHURINE 6.0 02/15/2021 0301   GLUCOSEU NEGATIVE 02/15/2021 0301   HGBUR NEGATIVE 02/15/2021 0301   BILIRUBINUR NEGATIVE 02/15/2021 0301   KETONESUR NEGATIVE 02/15/2021 0301   PROTEINUR NEGATIVE 02/15/2021 0301   NITRITE NEGATIVE 02/15/2021 0301   LEUKOCYTESUR NEGATIVE 02/15/2021 0301    Sepsis Labs: @LABRCNTIP (procalcitonin:4,lacticidven:4)  ) Recent Results (from the past 240 hour(s))  Resp Panel by RT-PCR (Flu A&B, Covid) Nasopharyngeal Swab     Status: None   Collection Time: 02/14/21 11:12 PM   Specimen: Nasopharyngeal Swab; Nasopharyngeal(NP) swabs in vial transport medium  Result Value Ref Range Status   SARS Coronavirus 2 by RT PCR NEGATIVE NEGATIVE Final    Comment: (NOTE) SARS-CoV-2 target nucleic acids are NOT DETECTED.  The SARS-CoV-2 RNA is generally detectable in upper respiratory specimens during the acute phase of infection. The lowest concentration of SARS-CoV-2 viral copies this assay can detect is 138 copies/mL. A negative result does not preclude SARS-Cov-2 infection and should not be used as the sole basis for treatment or other patient management decisions. A negative result may occur with  improper specimen collection/handling, submission of specimen other than nasopharyngeal swab, presence of viral mutation(s) within the areas targeted by this assay, and inadequate number of viral copies(<138 copies/mL). A negative result must be combined with clinical observations, patient history, and epidemiological information. The expected result is Negative.  Fact Sheet for Patients:  02/16/21  Fact Sheet for Healthcare Providers:  BloggerCourse.com  This test is no t yet approved or cleared by the [employer] FDA and  has been authorized for detection and/or diagnosis of SARS-CoV-2 by FDA under an Emergency Use Authorization (EUA). This EUA will remain  in effect (meaning this test can be used) for the duration of the COVID-19 declaration under Section 564(b)(1) of the Act, 21 U.S.C.section 360bbb-3(b)(1), unless the authorization is terminated  or revoked sooner.       Influenza A by PCR NEGATIVE NEGATIVE Final   Influenza B by PCR NEGATIVE NEGATIVE Final    Comment: (NOTE) The  Xpert Xpress SARS-CoV-2/FLU/RSV plus assay is intended as an aid in the diagnosis of influenza from Nasopharyngeal swab specimens and should not be used as a sole basis for treatment. Nasal washings and aspirates are unacceptable for Xpert Xpress SARS-CoV-2/FLU/RSV testing.  Fact Sheet for Patients: [place]  Fact Sheet for Healthcare Providers: BloggerCourse.com  This test is not yet approved or cleared by the [employer] FDA and has been authorized for detection and/or diagnosis of SARS-CoV-2 by FDA under an Emergency Use Authorization (EUA). This EUA will remain in effect (meaning this test can be used) for the duration of the COVID-19 declaration under Section 564(b)(1) of the Act, 21 U.S.C. section 360bbb-3(b)(1),  unless the authorization is terminated or revoked.  Performed at [facility], [address], [place], [place] [address]       Studies: No results found.  Scheduled Meds:  aspirin EC  81 mg Oral Daily   aspirin EC  81 mg Oral Once   atorvastatin  80 mg Oral Daily   [START ON 02/17/2021] enoxaparin (LOVENOX) injection  40 mg Subcutaneous Q24H   furosemide  40 mg Intravenous Q12H   insulin pump   Subcutaneous Q4H   magnesium oxide  200 mg Oral Daily   pantoprazole  40 mg Oral Daily   potassium chloride  20 mEq Oral Daily   sodium chloride flush  3 mL Intravenous Q12H    Continuous Infusions:  sodium chloride       LOS: 1 day     Darlin Drop, MD [facility] Pager [phone]  If 7PM-7AM, please contact night-coverage [email or web address] Password [facility] 02/16/2021, 5:04 PM

## 2021-02-16 NOTE — Progress Notes (Signed)
Progress Note  Patient Name: Amanda Frederick Date of Encounter: 02/16/2021  Primary Cardiologist: Parke Poisson, MD  Subjective   No major change in status as yet.  Intermittent shortness of breath, still with leg swelling.  No definite angina.  Inpatient Medications    Scheduled Meds:  aspirin EC  81 mg Oral Daily   aspirin EC  81 mg Oral Once   enoxaparin (LOVENOX) injection  80 mg Subcutaneous Q12H   furosemide  40 mg Intravenous Q12H   insulin pump   Subcutaneous Q4H   lisinopril  20 mg Oral Daily   magnesium oxide  200 mg Oral Daily   pantoprazole  40 mg Oral Daily   potassium chloride  20 mEq Oral Daily   sodium chloride flush  3 mL Intravenous Q12H   Continuous Infusions:  sodium chloride     PRN Meds: sodium chloride, acetaminophen **OR** acetaminophen, HYDROcodone-acetaminophen, oxyCODONE, sodium chloride flush, tiZANidine   Vital Signs    Vitals:   02/16/21 0005 02/16/21 0300 02/16/21 0435 02/16/21 0929  BP: 111/80  (!) 130/98 112/73  Pulse: (!) 108  (!) 106   Resp: 20  18   Temp: 98.5 F (36.9 C)  98.6 F (37 C) 98.3 F (36.8 C)  TempSrc: Oral  Oral Oral  SpO2: 99%  99%   Weight:  90.8 kg    Height:        Intake/Output Summary (Last 24 hours) at 02/16/2021 1045 Last data filed at 02/16/2021 1000 Gross per 24 hour  Intake 2200 ml  Output 2850 ml  Net -650 ml   Filed Weights   02/14/21 2334 02/16/21 0300  Weight: 77.1 kg 90.8 kg    Telemetry    Sinus tachycardia.  Personally reviewed.  ECG    An ECG dated 01/16/2021 was personally reviewed today and demonstrated:  Sinus tachycardia with left atrial enlargement, poor R wave progression rule out old anterior infarct pattern, possible high lateral Q waves as well.  Physical Exam   GEN: No acute distress.   Neck: No JVD. Cardiac: Tachycardic regular rhythm,, no gallop.  Respiratory: Nonlabored.  Decreased breath sounds mid to basal left lower lung. GI: Soft, nontender, bowel sounds  present. MS: Leg edema. Neuro:  Nonfocal. Psych: Alert and oriented x 3. Normal affect.  Labs    Chemistry Recent Labs  Lab 02/14/21 1859 02/15/21 0248 02/15/21 0409  NA 139  --  142  K 4.3  --  3.6  CL 107  --  107  CO2 24  --  24  GLUCOSE 235*  --  143*  BUN 18  --  15  CREATININE 0.93  --  0.73  CALCIUM 9.2  --  8.7*  PROT  --  6.1* 5.7*  ALBUMIN  --  3.3* 3.2*  AST  --  30 26  ALT  --  36 34  ALKPHOS  --  199* 183*  BILITOT  --  1.1 1.0  GFRNONAA >60  --  >60  ANIONGAP 8  --  11     Hematology Recent Labs  Lab 02/14/21 1859 02/15/21 0249 02/15/21 0409  WBC 9.3 7.3 7.4  RBC 5.60* 5.51* 5.26*  HGB 15.1* 14.5 14.3  HCT 48.3* 48.0* 45.2  MCV 86.3 87.1 85.9  MCH 27.0 26.3 27.2  MCHC 31.3 30.2 31.6  RDW 13.6 13.7 13.7  PLT 296 286 260    Cardiac Enzymes Recent Labs  Lab 02/14/21 1859 02/14/21 2121 02/15/21 0248 02/15/21 0409  TROPONINIHS 27* 24* 23* 23*    BNP Recent Labs  Lab 02/14/21 2121  BNP 624.2*     DDimer Recent Labs  Lab 02/15/21 0409  DDIMER 0.90*     Radiology    DG Chest 2 View  Result Date: 02/14/2021 CLINICAL DATA:  Shortness of breath, bilateral lower extremity edema EXAM: CHEST - 2 VIEW COMPARISON:  03/02/2020 FINDINGS: The heart is top normal in size. Pulmonary vascular congestion and suspected mild interstitial edema. Moderate left and small right pleural effusions. Associated lower lobe opacities, likely atelectasis. No pneumothorax. IMPRESSION: Suspected mild interstitial edema. Moderate left and small right pleural effusions. Associated lower lobe opacities, likely atelectasis. Electronically Signed   By: Charline Bills M.D.   On: 02/14/2021 19:41   ECHOCARDIOGRAM COMPLETE  Result Date: 02/15/2021    ECHOCARDIOGRAM REPORT   Patient Name:   Amanda Frederick Date of Exam: 02/15/2021 Medical Rec #:  [id number]     Height:       66.0 in Accession #:    [id number]    Weight:       170.0 lb Date of Birth:  July 16, 1971     BSA:           1.866 m Patient Age:    50 years      BP:           156/117 mmHg Patient Gender: F             HR:           106 bpm. Exam Location:  Inpatient Procedure: 2D Echo, Cardiac Doppler and Color Doppler         REPORT CONTAINS CRITICAL RESULT Reported to: Dr Jens Som on 02/15/2021 1:46:00 PM          Reading Cardiologist notified. Indications:    Acute Respiratory Distress R06.03  History:        Patient has no prior history of Echocardiogram examinations.                 Risk Factors:Hypertension, Diabetes and Dyslipidemia. Seizures.                 COVID-19.  Sonographer:    Elmarie Shiley Dance Referring Phys: [id number] ANASTASSIA DOUTOVA IMPRESSIONS  1. Global hypokinesis with apical akinesis; overall severe LV dysfunction.  2. Left ventricular ejection fraction, by estimation, is 25 to 30%. The left ventricle has severely decreased function. The left ventricle demonstrates regional wall motion abnormalities (see scoring diagram/findings for description). Left ventricular diastolic parameters are consistent with Grade III diastolic dysfunction (restrictive). Elevated left atrial pressure.  3. Right ventricular systolic function is normal. The right ventricular size is normal. There is mildly elevated pulmonary artery systolic pressure.  4. Large pleural effusion in the left lateral region.  5. The mitral valve is normal in structure. Mild mitral valve regurgitation. No evidence of mitral stenosis.  6. The aortic valve is tricuspid. Aortic valve regurgitation is not visualized. No aortic stenosis is present.  7. The inferior vena cava is dilated in size with >50% respiratory variability, suggesting right atrial pressure of 8 mmHg. FINDINGS  Left Ventricle: Left ventricular ejection fraction, by estimation, is 25 to 30%. The left ventricle has severely decreased function. The left ventricle demonstrates regional wall motion abnormalities. The left ventricular internal cavity size was normal  in size. There is no left  ventricular hypertrophy. Left ventricular diastolic parameters are consistent with Grade III diastolic dysfunction (restrictive). Elevated left atrial pressure. Right Ventricle: The  right ventricular size is normal. Right ventricular systolic function is normal. There is mildly elevated pulmonary artery systolic pressure. The tricuspid regurgitant velocity is 2.74 m/s, and with an assumed right atrial pressure of 8 mmHg, the estimated right ventricular systolic pressure is 38.0 mmHg. Left Atrium: Left atrial size was normal in size. Right Atrium: Right atrial size was normal in size. Pericardium: Trivial pericardial effusion is present. Mitral Valve: The mitral valve is normal in structure. Mild mitral valve regurgitation. No evidence of mitral valve stenosis. Tricuspid Valve: The tricuspid valve is normal in structure. Tricuspid valve regurgitation is mild . No evidence of tricuspid stenosis. Aortic Valve: The aortic valve is tricuspid. Aortic valve regurgitation is not visualized. No aortic stenosis is present. Pulmonic Valve: The pulmonic valve was normal in structure. Pulmonic valve regurgitation is trivial. No evidence of pulmonic stenosis. Aorta: The aortic root is normal in size and structure. Venous: The inferior vena cava is dilated in size with greater than 50% respiratory variability, suggesting right atrial pressure of 8 mmHg. IAS/Shunts: No atrial level shunt detected by color flow Doppler. Additional Comments: Global hypokinesis with apical akinesis; overall severe LV dysfunction. There is a large pleural effusion in the left lateral region.  LEFT VENTRICLE PLAX 2D LVIDd:         4.70 cm  Diastology LVIDs:         3.40 cm  LV e' medial:    6.31 cm/s LV PW:         1.10 cm  LV E/e' medial:  19.3 LV IVS:        0.80 cm  LV e' lateral:   7.40 cm/s LVOT diam:     1.90 cm  LV E/e' lateral: 16.5 LV SV:         32 LV SV Index:   17 LVOT Area:     2.84 cm  RIGHT VENTRICLE             IVC RV Basal diam:  3.30  cm     IVC diam: 2.30 cm RV Mid diam:    1.80 cm RV S prime:     11.20 cm/s TAPSE (M-mode): 1.6 cm LEFT ATRIUM           Index       RIGHT ATRIUM           Index LA diam:      4.40 cm 2.36 cm/m  RA Area:     15.80 cm LA Vol (A2C): 52.3 ml 28.02 ml/m RA Volume:   44.00 ml  23.58 ml/m LA Vol (A4C): 36.0 ml 19.29 ml/m  AORTIC VALVE LVOT Vmax:   67.50 cm/s LVOT Vmean:  49.300 cm/s LVOT VTI:    0.112 m  AORTA Ao Root diam: 2.70 cm Ao Asc diam:  2.80 cm MITRAL VALVE                TRICUSPID VALVE MV Area (PHT): 5.54 cm     TR Peak grad:   30.0 mmHg MV Decel Time: 137 msec     TR Vmax:        274.00 cm/s MV E velocity: 122.00 cm/s MV A velocity: 39.20 cm/s   SHUNTS MV E/A ratio:  3.11         Systemic VTI:  0.11 m                             Systemic Diam: 1.90  cm Olga Millers MD Electronically signed by Olga Millers MD Signature Date/Time: 02/15/2021/2:34:54 PM    Final    XR Ankle Complete Left  Result Date: 02/14/2021 3 views left ankle show a healing bimalleolar ankle fracture status post open reduction/internal fixation.  The mortise is congruent.  VAS [place] LOWER EXTREMITY VENOUS (DVT) (ONLY [facility] & [facility])  Result Date: 02/15/2021  Lower Venous DVT Study Patient Name:  KAMBERLYN LECHLER  Date of Exam:   02/15/2021 Medical Rec #: [id number]      Accession #:    [id number] Date of Birth: [birth year]      Patient Gender: F Patient Age:   49Y Exam Location:  [facility] Procedure:      VAS [place] LOWER EXTREMITY VENOUS (DVT) Referring Phys: [id number] ANASTASSIA DOUTOVA --------------------------------------------------------------------------------  Indications: Edema. Other Indications: LLE bimalleolar fracture 6 weeks post surgery. Comparison Study: No previous exams Performing Technologist: Jody Hill RVT, RDMS  Examination Guidelines: A complete evaluation includes B-mode imaging, spectral Doppler, color Doppler, and power Doppler as needed of all accessible portions of each vessel. Bilateral testing is considered an  integral part of a complete examination. Limited examinations for reoccurring indications may be performed as noted. The reflux portion of the exam is performed with the patient in reverse Trendelenburg.  +---------+---------------+---------+-----------+----------+--------------+ RIGHT    CompressibilityPhasicitySpontaneityPropertiesThrombus Aging +---------+---------------+---------+-----------+----------+--------------+ CFV      Full           Yes      Yes                                 +---------+---------------+---------+-----------+----------+--------------+ SFJ      Full                                                        +---------+---------------+---------+-----------+----------+--------------+ FV Prox  Full           Yes      Yes                                 +---------+---------------+---------+-----------+----------+--------------+ FV Mid   Full           Yes      Yes                                 +---------+---------------+---------+-----------+----------+--------------+ FV DistalFull           Yes      Yes                                 +---------+---------------+---------+-----------+----------+--------------+ PFV      Full                                                        +---------+---------------+---------+-----------+----------+--------------+ POP      Full           Yes      Yes                                 +---------+---------------+---------+-----------+----------+--------------+  PTV      Full                                                        +---------+---------------+---------+-----------+----------+--------------+ PERO     Full                                                        +---------+---------------+---------+-----------+----------+--------------+   +---------+---------------+---------+-----------+----------+--------------+ LEFT     CompressibilityPhasicitySpontaneityPropertiesThrombus  Aging +---------+---------------+---------+-----------+----------+--------------+ CFV      Full           Yes      Yes                                 +---------+---------------+---------+-----------+----------+--------------+ SFJ      Full                                                        +---------+---------------+---------+-----------+----------+--------------+ FV Prox  Full           Yes      Yes                                 +---------+---------------+---------+-----------+----------+--------------+ FV Mid   Full           Yes      Yes                                 +---------+---------------+---------+-----------+----------+--------------+ FV DistalFull           Yes      Yes                                 +---------+---------------+---------+-----------+----------+--------------+ PFV      Full                                                        +---------+---------------+---------+-----------+----------+--------------+ POP      Full           Yes      Yes                                 +---------+---------------+---------+-----------+----------+--------------+ PTV      Full                                                        +---------+---------------+---------+-----------+----------+--------------+  PERO     Full                                                        +---------+---------------+---------+-----------+----------+--------------+     Summary: BILATERAL: - No evidence of deep vein thrombosis seen in the lower extremities, bilaterally. - No evidence of superficial venous thrombosis in the lower extremities, bilaterally. -No evidence of popliteal cyst, bilaterally.   *See table(s) above for measurements and observations. Electronically signed by Heath Lark on 02/15/2021 at 4:40:24 PM.    Final     Patient Profile     50 y.o. female with a history of type 1 diabetes mellitus, hypertension, hyperlipidemia, peripheral  neuropathy, fibromyalgia, and ORIF 6 weeks ago for ankle fracture now presenting with newly documented systolic heart failure and cardiomyopathy.  Assessment & Plan    1.  Acute/subacute combined heart failure with LVEF 25 to 30% and restrictive diastolic filling pattern associated with wall motion abnormalities suggesting underlying ischemic heart disease.  Possibility of prior myocarditis with COVID-19 infection last year to be considered as well.  2.  Essential hypertension  3.  Type 1 diabetes mellitus.  4.  Mixed hyperlipidemia.  I reviewed the cardiology consultation note from yesterday, discussed with patient and husband at bedside.  Main focus now is improvement in fluid status, she is on Lasix 40 mg twice daily with potassium supplement.  Continue aspirin, start statin.  Plan to stop lisinopril and after 36-hour washout initiate Entresto.  Aldactone to follow.  Anticipate right and left heart catheterization early this week.  Signed, Nona Dell, MD  02/16/2021, 10:45 AM

## 2021-02-16 NOTE — Progress Notes (Signed)
   02/16/21 1100  Assess: MEWS Score  Temp 98.1 F (36.7 C)  BP 121/84  Pulse Rate (!) 108  ECG Heart Rate (!) 109  Resp (!) 21  Level of Consciousness Alert  SpO2 94 %  O2 Device Room Air  Assess: MEWS Score  MEWS Temp 0  MEWS Systolic 0  MEWS Pulse 1  MEWS RR 1  MEWS LOC 0  MEWS Score 2  MEWS Score Color Yellow  Assess: if the MEWS score is Yellow or Red  Were vital signs taken at a resting state? Yes  Focused Assessment Change from prior assessment (see assessment flowsheet)  Early Detection of Sepsis Score *See Row Information* Low  MEWS guidelines implemented *See Row Information* Yes  Treat  MEWS Interventions Administered scheduled meds/treatments  Take Vital Signs  Increase Vital Sign Frequency  Yellow: Q 2hr X 2 then Q 4hr X 2, if remains yellow, continue Q 4hrs  Notify: Charge Nurse/RN  Name of Charge Nurse/RN Notified Jessica, RN  Date Charge Nurse/RN Notified 02/16/21  Notify: Provider  Provider Name/Title Margo Aye, Carole/MD  Date Provider Notified 02/16/21  Time Provider Notified 1249  Document  Patient Outcome Other (Comment) (See new orders)  Progress note created (see row info) Yes

## 2021-02-17 ENCOUNTER — Inpatient Hospital Stay (HOSPITAL_COMMUNITY): Payer: 59

## 2021-02-17 ENCOUNTER — Encounter (HOSPITAL_COMMUNITY): Payer: Self-pay | Admitting: Internal Medicine

## 2021-02-17 DIAGNOSIS — I10 Essential (primary) hypertension: Secondary | ICD-10-CM | POA: Diagnosis not present

## 2021-02-17 LAB — BODY FLUID CELL COUNT WITH DIFFERENTIAL
Lymphs, Fluid: 65 %
Monocyte-Macrophage-Serous Fluid: 15 % — ABNORMAL LOW (ref 50–90)
Neutrophil Count, Fluid: 20 % (ref 0–25)
Total Nucleated Cell Count, Fluid: 244 cu mm (ref 0–1000)

## 2021-02-17 LAB — LACTATE DEHYDROGENASE, PLEURAL OR PERITONEAL FLUID: LD, Fluid: 61 U/L — ABNORMAL HIGH (ref 3–23)

## 2021-02-17 LAB — BASIC METABOLIC PANEL
Anion gap: 7 (ref 5–15)
BUN: 17 mg/dL (ref 6–20)
CO2: 28 mmol/L (ref 22–32)
Calcium: 8.8 mg/dL — ABNORMAL LOW (ref 8.9–10.3)
Chloride: 103 mmol/L (ref 98–111)
Creatinine, Ser: 0.77 mg/dL (ref 0.44–1.00)
GFR, Estimated: >60 mL/min (ref >60)
Glucose, Bld: 109 mg/dL — ABNORMAL HIGH (ref 70–99)
Potassium: 3.8 mmol/L (ref 3.5–5.1)
Sodium: 138 mmol/L (ref 135–145)

## 2021-02-17 LAB — GLUCOSE, CAPILLARY
Glucose-Capillary: 104 mg/dL — ABNORMAL HIGH (ref 70–99)
Glucose-Capillary: 137 mg/dL — ABNORMAL HIGH (ref 70–99)
Glucose-Capillary: 153 mg/dL — ABNORMAL HIGH (ref 70–99)
Glucose-Capillary: 170 mg/dL — ABNORMAL HIGH (ref 70–99)
Glucose-Capillary: 180 mg/dL — ABNORMAL HIGH (ref 70–99)
Glucose-Capillary: 183 mg/dL — ABNORMAL HIGH (ref 70–99)
Glucose-Capillary: 81 mg/dL (ref 70–99)

## 2021-02-17 LAB — GLUCOSE, PLEURAL OR PERITONEAL FLUID: Glucose, Fluid: 129 mg/dL

## 2021-02-17 LAB — ALBUMIN, PLEURAL OR PERITONEAL FLUID: Albumin, Fluid: 1.2 g/dL

## 2021-02-17 LAB — GRAM STAIN

## 2021-02-17 LAB — ALBUMIN: Albumin: 3 g/dL — ABNORMAL LOW (ref 3.5–5.0)

## 2021-02-17 MED ORDER — SPIRONOLACTONE 12.5 MG HALF TABLET
12.5000 mg | ORAL_TABLET | Freq: Every day | ORAL | Status: DC
  Administered 2021-02-17 – 2021-02-19 (×3): 12.5 mg via ORAL
  Filled 2021-02-17 (×3): qty 1

## 2021-02-17 MED ORDER — ASPIRIN 81 MG PO CHEW
CHEWABLE_TABLET | ORAL | Status: AC
  Filled 2021-02-17: qty 1

## 2021-02-17 MED ORDER — SENNOSIDES-DOCUSATE SODIUM 8.6-50 MG PO TABS
1.0000 | ORAL_TABLET | Freq: Two times a day (BID) | ORAL | Status: DC
  Administered 2021-02-17 – 2021-02-21 (×7): 1 via ORAL
  Filled 2021-02-17 (×9): qty 1

## 2021-02-17 MED ORDER — LIDOCAINE HCL (PF) 1 % IJ SOLN
INTRAMUSCULAR | Status: AC
  Filled 2021-02-17: qty 5

## 2021-02-17 NOTE — Progress Notes (Addendum)
Progress Note  Patient Name: Amanda Frederick Date of Encounter: 02/17/2021  Primary Cardiologist: Parke Poisson, MD  Subjective   Still with orthopnea, intermittent sense of fluttering and chest discomfort.  Inpatient Medications    Scheduled Meds:  aspirin EC  81 mg Oral Daily   aspirin EC  81 mg Oral Once   atorvastatin  80 mg Oral Daily   enoxaparin (LOVENOX) injection  40 mg Subcutaneous Q24H   furosemide  40 mg Intravenous Q12H   insulin pump   Subcutaneous Q4H   magnesium oxide  200 mg Oral Daily   pantoprazole  40 mg Oral Daily   potassium chloride  20 mEq Oral Daily   senna-docusate  1 tablet Oral BID   sodium chloride flush  3 mL Intravenous Q12H   Continuous Infusions:  sodium chloride     PRN Meds: sodium chloride, acetaminophen **OR** acetaminophen, HYDROcodone-acetaminophen, oxyCODONE, sodium chloride flush, tiZANidine   Vital Signs    Vitals:   02/17/21 0010 02/17/21 0308 02/17/21 0436 02/17/21 0754  BP: 118/80 112/78  120/85  Pulse: 99 (!) 103  100  Resp: 17 17  14   Temp: (!) 97.4 F (36.3 C) 97.8 F (36.6 C)  98.5 F (36.9 C)  TempSrc: Oral Oral  Oral  SpO2: 98% 97%  98%  Weight:   88.7 kg   Height:        Intake/Output Summary (Last 24 hours) at 02/17/2021 0847 Last data filed at 02/17/2021 0700 Gross per 24 hour  Intake 240 ml  Output 2750 ml  Net -2510 ml   Filed Weights   02/14/21 2334 02/16/21 0300 02/17/21 0436  Weight: 77.1 kg 90.8 kg 88.7 kg    Telemetry    Sinus tachycardia.  Personally reviewed.  ECG    An ECG dated 02/16/2021 was personally reviewed today and demonstrated:  Sinus tachycardia with left atrial enlargement, poor R wave progression rule out old anterior infarct pattern, possible high lateral Q waves as well.  Physical Exam   GEN: No acute distress.   Neck: No JVD. Cardiac: Tachycardic regular rhythm, no gallop.  Respiratory: Nonlabored.  Decreased breath sounds mid to basal left lung. GI: Soft,  nontender, bowel sounds present. MS: Leg edema. Neuro:  Nonfocal. Psych: Alert and oriented x 3. Normal affect.  Labs    Chemistry Recent Labs  Lab 02/15/21 0248 02/15/21 0409 02/16/21 1225 02/17/21 0230  NA  --  142 138 138  K  --  3.6 3.7 3.8  CL  --  107 101 103  CO2  --  24 29 28   GLUCOSE  --  143* 102* 109*  BUN  --  15 14 17   CREATININE  --  0.73 0.82 0.77  CALCIUM  --  8.7* 8.8* 8.8*  PROT 6.1* 5.7*  --   --   ALBUMIN 3.3* 3.2*  --   --   AST 30 26  --   --   ALT 36 34  --   --   ALKPHOS 199* 183*  --   --   BILITOT 1.1 1.0  --   --   GFRNONAA  --  >60 >60 >60  ANIONGAP  --  11 8 7      Hematology Recent Labs  Lab 02/15/21 0249 02/15/21 0409 02/16/21 1225  WBC 7.3 7.4 8.4  RBC 5.51* 5.26* 5.43*  HGB 14.5 14.3 14.5  HCT 48.0* 45.2 45.6  MCV 87.1 85.9 84.0  MCH 26.3 27.2 26.7  MCHC  30.2 31.6 31.8  RDW 13.7 13.7 13.4  PLT 286 260 263    Cardiac Enzymes Recent Labs  Lab 02/14/21 1859 02/14/21 2121 02/15/21 0248 02/15/21 0409 02/16/21 1225  TROPONINIHS 27* 24* 23* 23* 22*    BNP Recent Labs  Lab 02/14/21 2121  BNP 624.2*     DDimer Recent Labs  Lab 02/15/21 0409  DDIMER 0.90*     Radiology    ECHOCARDIOGRAM COMPLETE  Result Date: 02/15/2021    ECHOCARDIOGRAM REPORT   Patient Name:   Amanda Frederick Date of Exam: 02/15/2021 Medical Rec #:  [id number]     Height:       66.0 in Accession #:    [id number]    Weight:       170.0 lb Date of Birth:  02/11/1971     BSA:          1.866 m Patient Age:    49 years      BP:           156/117 mmHg Patient Gender: F             HR:           106 bpm. Exam Location:  Inpatient Procedure: 2D Echo, Cardiac Doppler and Color Doppler         REPORT CONTAINS CRITICAL RESULT Reported to: Dr Jens Som on 02/15/2021 1:46:00 PM          Reading Cardiologist notified. Indications:    Acute Respiratory Distress R06.03  History:        Patient has no prior history of Echocardiogram examinations.                 Risk  Factors:Hypertension, Diabetes and Dyslipidemia. Seizures.                 COVID-19.  Sonographer:    Elmarie Shiley Dance Referring Phys: [id number] ANASTASSIA DOUTOVA IMPRESSIONS  1. Global hypokinesis with apical akinesis; overall severe LV dysfunction.  2. Left ventricular ejection fraction, by estimation, is 25 to 30%. The left ventricle has severely decreased function. The left ventricle demonstrates regional wall motion abnormalities (see scoring diagram/findings for description). Left ventricular diastolic parameters are consistent with Grade III diastolic dysfunction (restrictive). Elevated left atrial pressure.  3. Right ventricular systolic function is normal. The right ventricular size is normal. There is mildly elevated pulmonary artery systolic pressure.  4. Large pleural effusion in the left lateral region.  5. The mitral valve is normal in structure. Mild mitral valve regurgitation. No evidence of mitral stenosis.  6. The aortic valve is tricuspid. Aortic valve regurgitation is not visualized. No aortic stenosis is present.  7. The inferior vena cava is dilated in size with >50% respiratory variability, suggesting right atrial pressure of 8 mmHg. FINDINGS  Left Ventricle: Left ventricular ejection fraction, by estimation, is 25 to 30%. The left ventricle has severely decreased function. The left ventricle demonstrates regional wall motion abnormalities. The left ventricular internal cavity size was normal  in size. There is no left ventricular hypertrophy. Left ventricular diastolic parameters are consistent with Grade III diastolic dysfunction (restrictive). Elevated left atrial pressure. Right Ventricle: The right ventricular size is normal. Right ventricular systolic function is normal. There is mildly elevated pulmonary artery systolic pressure. The tricuspid regurgitant velocity is 2.74 m/s, and with an assumed right atrial pressure of 8 mmHg, the estimated right ventricular systolic pressure is 38.0 mmHg.  Left Atrium: Left atrial size was normal in size. Right Atrium: Right  atrial size was normal in size. Pericardium: Trivial pericardial effusion is present. Mitral Valve: The mitral valve is normal in structure. Mild mitral valve regurgitation. No evidence of mitral valve stenosis. Tricuspid Valve: The tricuspid valve is normal in structure. Tricuspid valve regurgitation is mild . No evidence of tricuspid stenosis. Aortic Valve: The aortic valve is tricuspid. Aortic valve regurgitation is not visualized. No aortic stenosis is present. Pulmonic Valve: The pulmonic valve was normal in structure. Pulmonic valve regurgitation is trivial. No evidence of pulmonic stenosis. Aorta: The aortic root is normal in size and structure. Venous: The inferior vena cava is dilated in size with greater than 50% respiratory variability, suggesting right atrial pressure of 8 mmHg. IAS/Shunts: No atrial level shunt detected by color flow Doppler. Additional Comments: Global hypokinesis with apical akinesis; overall severe LV dysfunction. There is a large pleural effusion in the left lateral region.  LEFT VENTRICLE PLAX 2D LVIDd:         4.70 cm  Diastology LVIDs:         3.40 cm  LV e' medial:    6.31 cm/s LV PW:         1.10 cm  LV E/e' medial:  19.3 LV IVS:        0.80 cm  LV e' lateral:   7.40 cm/s LVOT diam:     1.90 cm  LV E/e' lateral: 16.5 LV SV:         32 LV SV Index:   17 LVOT Area:     2.84 cm  RIGHT VENTRICLE             IVC RV Basal diam:  3.30 cm     IVC diam: 2.30 cm RV Mid diam:    1.80 cm RV S prime:     11.20 cm/s TAPSE (M-mode): 1.6 cm LEFT ATRIUM           Index       RIGHT ATRIUM           Index LA diam:      4.40 cm 2.36 cm/m  RA Area:     15.80 cm LA Vol (A2C): 52.3 ml 28.02 ml/m RA Volume:   44.00 ml  23.58 ml/m LA Vol (A4C): 36.0 ml 19.29 ml/m  AORTIC VALVE LVOT Vmax:   67.50 cm/s LVOT Vmean:  49.300 cm/s LVOT VTI:    0.112 m  AORTA Ao Root diam: 2.70 cm Ao Asc diam:  2.80 cm MITRAL VALVE                 TRICUSPID VALVE MV Area (PHT): 5.54 cm     TR Peak grad:   30.0 mmHg MV Decel Time: 137 msec     TR Vmax:        274.00 cm/s MV E velocity: 122.00 cm/s MV A velocity: 39.20 cm/s   SHUNTS MV E/A ratio:  3.11         Systemic VTI:  0.11 m                             Systemic Diam: 1.90 cm Olga MillersBrian Crenshaw MD Electronically signed by Olga MillersBrian Crenshaw MD Signature Date/Time: 02/15/2021/2:34:54 PM    Final    VAS US LOWER EXTREMITY VENOUS (DVT) (ONLY [facility] & [facility])  Result Date: 02/15/2021  Lower Venous DVT Study Patient Name:  Amanda Frederick  Date of Exam:   02/15/2021 Medical Rec #: [id number]  Accession #:    [id number] Date of Birth: 1971/06/26      Patient Gender: F Patient Age:   49Y Exam Location:  [facility] Procedure:      VAS [place] LOWER EXTREMITY VENOUS (DVT) Referring Phys: [id number] ANASTASSIA DOUTOVA --------------------------------------------------------------------------------  Indications: Edema. Other Indications: LLE bimalleolar fracture 6 weeks post surgery. Comparison Study: No previous exams Performing Technologist: Jody Hill RVT, RDMS  Examination Guidelines: A complete evaluation includes B-mode imaging, spectral Doppler, color Doppler, and power Doppler as needed of all accessible portions of each vessel. Bilateral testing is considered an integral part of a complete examination. Limited examinations for reoccurring indications may be performed as noted. The reflux portion of the exam is performed with the patient in reverse Trendelenburg.  +---------+---------------+---------+-----------+----------+--------------+ RIGHT    CompressibilityPhasicitySpontaneityPropertiesThrombus Aging +---------+---------------+---------+-----------+----------+--------------+ CFV      Full           Yes      Yes                                 +---------+---------------+---------+-----------+----------+--------------+ SFJ      Full                                                         +---------+---------------+---------+-----------+----------+--------------+ FV Prox  Full           Yes      Yes                                 +---------+---------------+---------+-----------+----------+--------------+ FV Mid   Full           Yes      Yes                                 +---------+---------------+---------+-----------+----------+--------------+ FV DistalFull           Yes      Yes                                 +---------+---------------+---------+-----------+----------+--------------+ PFV      Full                                                        +---------+---------------+---------+-----------+----------+--------------+ POP      Full           Yes      Yes                                 +---------+---------------+---------+-----------+----------+--------------+ PTV      Full                                                        +---------+---------------+---------+-----------+----------+--------------+  PERO     Full                                                        +---------+---------------+---------+-----------+----------+--------------+   +---------+---------------+---------+-----------+----------+--------------+ LEFT     CompressibilityPhasicitySpontaneityPropertiesThrombus Aging +---------+---------------+---------+-----------+----------+--------------+ CFV      Full           Yes      Yes                                 +---------+---------------+---------+-----------+----------+--------------+ SFJ      Full                                                        +---------+---------------+---------+-----------+----------+--------------+ FV Prox  Full           Yes      Yes                                 +---------+---------------+---------+-----------+----------+--------------+ FV Mid   Full           Yes      Yes                                  +---------+---------------+---------+-----------+----------+--------------+ FV DistalFull           Yes      Yes                                 +---------+---------------+---------+-----------+----------+--------------+ PFV      Full                                                        +---------+---------------+---------+-----------+----------+--------------+ POP      Full           Yes      Yes                                 +---------+---------------+---------+-----------+----------+--------------+ PTV      Full                                                        +---------+---------------+---------+-----------+----------+--------------+ PERO     Full                                                        +---------+---------------+---------+-----------+----------+--------------+  Summary: BILATERAL: - No evidence of deep vein thrombosis seen in the lower extremities, bilaterally. - No evidence of superficial venous thrombosis in the lower extremities, bilaterally. -No evidence of popliteal cyst, bilaterally.   *See table(s) above for measurements and observations. Electronically signed by Heath Lark on 02/15/2021 at 4:40:24 PM.    Final     Patient Profile     50 y.o. female with a history of type 1 diabetes mellitus, hypertension, hyperlipidemia, peripheral neuropathy, fibromyalgia, and ORIF 6 weeks ago for ankle fracture now presenting with newly documented systolic heart failure and cardiomyopathy.  Assessment & Plan    1.  Acute/subacute combined heart failure with LVEF 25 to 30% and restrictive diastolic filling pattern associated with wall motion abnormalities suggesting underlying ischemic heart disease.  Possibility of prior myocarditis with COVID-19 infection last year to be considered as well.  She is diuresing well on IV Lasix, approximately 3300 net output last 24 hours.  2.  Essential hypertension  3.  Type 1 diabetes mellitus.  4.  Mixed  hyperlipidemia.  Still need to optimize fluid status with continued diuresis prior to considering right left heart catheterization, she cannot lay flat as yet.  Continue aspirin, Lipitor, and current dose of IV Lasix with potassium supplement.  Lisinopril was discontinued, can initiate [facility].  Starting low-dose Aldactone as well.  Recheck BMET in a.m.  Signed, Nona Dell, MD  02/17/2021, 8:47 AM

## 2021-02-17 NOTE — Procedures (Signed)
PROCEDURE SUMMARY:  Successful image-guided left thoracentesis. Yielded 700 cc  of  clear yellow fluid. Pt tolerated procedure well. No immediate complications. EBL = trace   Specimen was  sent for labs. CXR ordered.  Please see imaging section of Epic for full dictation.  Lynann Bologna Erabella Kuipers PA-C 02/17/2021 12:32 PM

## 2021-02-17 NOTE — Progress Notes (Signed)
PROGRESS NOTE  Analise Domin TGG:[id number] DOB: [birth year] DOA: 02/14/2021 PCP: Paulina Fusi, MD  HPI/Recap of past 24 hours:  Amanda Frederick is a 50 y.o. female with medical history significant of Fibromyalgia  Dm1 on insulin pump, HTN, HLD, diabetic polyneuropathy, sp bimalleolar ankle fracture repair on 01/04/21 by Dr. Magnus Ivan who presented with B/L lower extremity swelling, worsening shortness of breath x 2 weeks, abdominal girth increase, weight gain, inability to lay flat. Occasional chest pain worse with exertion.  Was advised to come to the ED by her orthopedic surgeon to r/o DVT/PE.  Workup revealed acute systolic CHF and moderate to large left pleural effusion.  She is post L thoracentesis with 700cc fluid removed by IR on 7/24.  Seen by cardiology.  Plan to diurese more and to take to the cath lab possibly on Monday or another day next week.  02/17/21:  Seen at bedside with her husband present.  Feels a little better today.  Assessment/Plan: Active Problems:   DM (diabetes mellitus), type 1 (HCC)   Essential hypertension   HLD (hyperlipidemia)   Accelerated hypertension   Tachycardia   Acute systolic CHF (congestive heart failure) (HCC)  Acute combined systolic and diastolic CHF Patient presented with bilateral lower extremity edema, dyspnea with minimal exertion, orthopnea, elevated BNP greater than 600, pulmonary edema and bilateral pleural effusions left greater than right on chest x-ray. 2D echo done on 02/15/2021 revealed LVEF 25 to 30%, restrictive diastolic filling pattern associated with wall motion abnormalities suggesting underlying ischemic heart disease.  Grade 3 diastolic dysfunction, global hypokinesis with apical akinesis, severe left ventricular dysfunction. She was seen by cardiology, appreciate assistance. Ongoing diuresing with plan for left and right heart cath likely next week. Currently on IV Lasix 40 mg twice daily Closely monitor blood pressure and  electrolytes Replete electrolytes and adjust diuretics doses if indicated. Maintain MAP greater than 65 Continue strict I's and O's and daily weight. Net I&O -3.7 L.  Elevated troponin, likely demand ischemia in the setting of sinus tachycardia, pulmonary edema and bilateral pleural effusions. Troponin peaked at 27 and down trended.  Bilateral pleural effusions, left greater than right, likely cardiogenic, post left thoracentesis by IR on 02/17/2021. Left pleural effusion appears moderate to large IR consult for possible left thoracentesis. Postthoracentesis on 02/17/2021 with 700 cc clear yellow fluid removed, sample sent for analysis. Appreciate IR's assistance. Ongoing diuresing directed by cardiology.  Sinus tachycardia, suspect secondary to pulmonary edema/pleural effusions TSH was normal Treat underlying conditions  Transient hypoxia reported by her husband when she sleeps She is currently on room air with O2 saturation of 98% When she sleeps her sats dropped to the 80s. Possible underlying OSA. Reassess after left thoracentesis and diuresing, may consider pulmonary follow-up outpatient for polysomnography.  Hyperlipidemia LDL 97, goal less than 70. Continue Lipitor 80 mg daily, started during this admission.  Type 1 diabetes with hyperlipidemia. Hemoglobin A1c 6.8 on 02/15/2021. Well-controlled Continue insulin pump.  GERD Resume home Protonix  Fibromyalgia/recent left ankle surgery post repair Analgesic as needed along with bowel regimen. Recent left ankle surgery post repair: PT OT to assess, fall precautions.    Code Status: Full code  Family Communication: Updated her husband at bedside  Disposition Plan: Likely will discharge to home once cardiology signs off.   Consultants: Cardiology  Procedures: 2D echo  Antimicrobials: None  DVT prophylaxis: Subcu Lovenox daily  Status is: Inpatient    Dispo: The patient is from: Home.  Anticipated d/c is to: Home when cardiology signs off.               Patient currently not stable for discharge.  Ongoing diuresing.     Difficult to place patient, not applicable.        Objective: Vitals:   02/17/21 0010 02/17/21 0308 02/17/21 0436 02/17/21 0754  BP: 118/80 112/78  120/85  Pulse: 99 (!) 103  100  Resp: 17 17  14   Temp: (!) 97.4 F (36.3 C) 97.8 F (36.6 C)  98.5 F (36.9 C)  TempSrc: Oral Oral  Oral  SpO2: 98% 97%  98%  Weight:   88.7 kg   Height:        Intake/Output Summary (Last 24 hours) at 02/17/2021 1235 Last data filed at 02/17/2021 1100 Gross per 24 hour  Intake 476 ml  Output 3000 ml  Net -2524 ml   Filed Weights   02/14/21 2334 02/16/21 0300 02/17/21 0436  Weight: 77.1 kg 90.8 kg 88.7 kg    Exam:  General: 50 y.o. year-old female well-developed well-nourished in no acute distress.  She is alert and oriented x3.   Cardiovascular: Tachycardic with no rubs or gallops.  No JVD aortomegaly noted.   Respiratory: Mild rales at bases with no wheezing noted.  Good inspiratory effort. Abdomen: Soft nontender normal bowel sounds present.  Nondistended.  Musculoskeletal: 1+ pitting edema lower extremities bilaterally.  Left ankle in surgical dressing.   Skin: No ulcerative lesions noted. Psychiatry: Mood is appropriate for condition and setting.   Data Reviewed: CBC: Recent Labs  Lab 02/14/21 1859 02/15/21 0249 02/15/21 0409 02/16/21 1225  WBC 9.3 7.3 7.4 8.4  NEUTROABS 7.1  --  5.5  --   HGB 15.1* 14.5 14.3 14.5  HCT 48.3* 48.0* 45.2 45.6  MCV 86.3 87.1 85.9 84.0  PLT 296 286 260 263   Basic Metabolic Panel: Recent Labs  Lab 02/14/21 1859 02/15/21 0248 02/15/21 0409 02/16/21 1225 02/17/21 0230  NA 139  --  142 138 138  K 4.3  --  3.6 3.7 3.8  CL 107  --  107 101 103  CO2 24  --  24 29 28   GLUCOSE 235*  --  143* 102* 109*  BUN 18  --  15 14 17   CREATININE 0.93  --  0.73 0.82 0.77  CALCIUM 9.2  --  8.7* 8.8* 8.8*  MG  --   2.0  --  1.8  --   PHOS  --  4.6  --  4.2  --    GFR: Estimated Creatinine Clearance: 94.4 mL/min (by C-G formula based on SCr of 0.77 mg/dL). Liver Function Tests: Recent Labs  Lab 02/15/21 0248 02/15/21 0409  AST 30 26  ALT 36 34  ALKPHOS 199* 183*  BILITOT 1.1 1.0  PROT 6.1* 5.7*  ALBUMIN 3.3* 3.2*   No results for input(s): LIPASE, AMYLASE in the last 168 hours. No results for input(s): AMMONIA in the last 168 hours. Coagulation Profile: Recent Labs  Lab 02/14/21 1859  INR 1.1   Cardiac Enzymes: Recent Labs  Lab 02/15/21 0248  CKTOTAL 337*   BNP (last 3 results) No results for input(s): PROBNP in the last 8760 hours. HbA1C: Recent Labs    02/15/21 0013  HGBA1C 6.8*   CBG: Recent Labs  Lab 02/16/21 1655 02/16/21 2042 02/17/21 0008 02/17/21 0417 02/17/21 0752  GLUCAP 119* 225* 137* 170* 104*   Lipid Profile: Recent Labs    02/15/21 0249  CHOL 153  HDL 44  LDLCALC 97  TRIG 60  CHOLHDL 3.5   Thyroid Function Tests: Recent Labs    02/15/21 0017 02/15/21 0248  TSH 3.196  --   FREET4  --  1.44*   Anemia Panel: No results for input(s): VITAMINB12, FOLATE, FERRITIN, TIBC, IRON, RETICCTPCT in the last 72 hours. Urine analysis:    Component Value Date/Time   COLORURINE COLORLESS (A) 02/15/2021 0301   APPEARANCEUR CLEAR 02/15/2021 0301   LABSPEC 1.004 (L) 02/15/2021 0301   PHURINE 6.0 02/15/2021 0301   GLUCOSEU NEGATIVE 02/15/2021 0301   HGBUR NEGATIVE 02/15/2021 0301   BILIRUBINUR NEGATIVE 02/15/2021 0301   KETONESUR NEGATIVE 02/15/2021 0301   PROTEINUR NEGATIVE 02/15/2021 0301   NITRITE NEGATIVE 02/15/2021 0301   LEUKOCYTESUR NEGATIVE 02/15/2021 0301   Sepsis Labs: @LABRCNTIP (procalcitonin:4,lacticidven:4)  ) Recent Results (from the past 240 hour(s))  Resp Panel by RT-PCR (Flu A&B, Covid) Nasopharyngeal Swab     Status: None   Collection Time: 02/14/21 11:12 PM   Specimen: Nasopharyngeal Swab; Nasopharyngeal(NP) swabs in vial  transport medium  Result Value Ref Range Status   SARS Coronavirus 2 by RT PCR NEGATIVE NEGATIVE Final    Comment: (NOTE) SARS-CoV-2 target nucleic acids are NOT DETECTED.  The SARS-CoV-2 RNA is generally detectable in upper respiratory specimens during the acute phase of infection. The lowest concentration of SARS-CoV-2 viral copies this assay can detect is 138 copies/mL. A negative result does not preclude SARS-Cov-2 infection and should not be used as the sole basis for treatment or other patient management decisions. A negative result may occur with  improper specimen collection/handling, submission of specimen other than nasopharyngeal swab, presence of viral mutation(s) within the areas targeted by this assay, and inadequate number of viral copies(<138 copies/mL). A negative result must be combined with clinical observations, patient history, and epidemiological information. The expected result is Negative.  Fact Sheet for Patients:  02/16/21  Fact Sheet for Healthcare Providers:  BloggerCourse.com  This test is no t yet approved or cleared by the [employer] FDA and  has been authorized for detection and/or diagnosis of SARS-CoV-2 by FDA under an Emergency Use Authorization (EUA). This EUA will remain  in effect (meaning this test can be used) for the duration of the COVID-19 declaration under Section 564(b)(1) of the Act, 21 U.S.C.section 360bbb-3(b)(1), unless the authorization is terminated  or revoked sooner.       Influenza A by PCR NEGATIVE NEGATIVE Final   Influenza B by PCR NEGATIVE NEGATIVE Final    Comment: (NOTE) The Xpert Xpress SARS-CoV-2/FLU/RSV plus assay is intended as an aid in the diagnosis of influenza from Nasopharyngeal swab specimens and should not be used as a sole basis for treatment. Nasal washings and aspirates are unacceptable for Xpert Xpress SARS-CoV-2/FLU/RSV testing.  Fact  Sheet for Patients: [place]  Fact Sheet for Healthcare Providers: BloggerCourse.com  This test is not yet approved or cleared by the [employer].[place] FDA and has been authorized for detection and/or diagnosis of SARS-CoV-2 by FDA under an Emergency Use Authorization (EUA). This EUA will remain in effect (meaning this test can be used) for the duration of the COVID-19 declaration under Section 564(b)(1) of the Act, 21 U.S.C. section 360bbb-3(b)(1), unless the authorization is terminated or revoked.  Performed at [facility], [address], [place], [place]       Studies: No results found.  Scheduled Meds:  aspirin       aspirin EC  81 mg  Oral Daily   aspirin EC  81 mg Oral Once   atorvastatin  80 mg Oral Daily   enoxaparin (LOVENOX) injection  40 mg Subcutaneous Q24H   furosemide  40 mg Intravenous Q12H   insulin pump   Subcutaneous Q4H   lidocaine (PF)       lidocaine (PF)       magnesium oxide  200 mg Oral Daily   pantoprazole  40 mg Oral Daily   potassium chloride  20 mEq Oral Daily   senna-docusate  1 tablet Oral BID   sodium chloride flush  3 mL Intravenous Q12H   spironolactone  12.5 mg Oral Daily    Continuous Infusions:  sodium chloride       LOS: 2 days     Darlin Drop, MD [facility] Pager [phone]  If 7PM-7AM, please contact night-coverage [email or web address] Password [facility] 02/17/2021, 12:35 PM

## 2021-02-18 DIAGNOSIS — E7800 Pure hypercholesterolemia, unspecified: Secondary | ICD-10-CM

## 2021-02-18 DIAGNOSIS — E109 Type 1 diabetes mellitus without complications: Secondary | ICD-10-CM

## 2021-02-18 DIAGNOSIS — I5021 Acute systolic (congestive) heart failure: Secondary | ICD-10-CM | POA: Diagnosis not present

## 2021-02-18 LAB — CBC
HCT: 48.7 % — ABNORMAL HIGH (ref 36.0–46.0)
Hemoglobin: 15.4 g/dL — ABNORMAL HIGH (ref 12.0–15.0)
MCH: 26.4 pg (ref 26.0–34.0)
MCHC: 31.6 g/dL (ref 30.0–36.0)
MCV: 83.5 fL (ref 80.0–100.0)
Platelets: 252 10*3/uL (ref 150–400)
RBC: 5.83 MIL/uL — ABNORMAL HIGH (ref 3.87–5.11)
RDW: 13.4 % (ref 11.5–15.5)
WBC: 6.9 10*3/uL (ref 4.0–10.5)
nRBC: 0 % (ref 0.0–0.2)

## 2021-02-18 LAB — BASIC METABOLIC PANEL
Anion gap: 10 (ref 5–15)
BUN: 12 mg/dL (ref 6–20)
CO2: 27 mmol/L (ref 22–32)
Calcium: 9.2 mg/dL (ref 8.9–10.3)
Chloride: 102 mmol/L (ref 98–111)
Creatinine, Ser: 0.89 mg/dL (ref 0.44–1.00)
GFR, Estimated: >60 mL/min (ref >60)
Glucose, Bld: 115 mg/dL — ABNORMAL HIGH (ref 70–99)
Potassium: 4.1 mmol/L (ref 3.5–5.1)
Sodium: 139 mmol/L (ref 135–145)

## 2021-02-18 LAB — CYTOLOGY - NON PAP

## 2021-02-18 LAB — PHOSPHORUS: Phosphorus: 4.8 mg/dL — ABNORMAL HIGH (ref 2.5–4.6)

## 2021-02-18 LAB — MAGNESIUM: Magnesium: 2 mg/dL (ref 1.7–2.4)

## 2021-02-18 LAB — GLUCOSE, CAPILLARY
Glucose-Capillary: 103 mg/dL — ABNORMAL HIGH (ref 70–99)
Glucose-Capillary: 91 mg/dL (ref 70–99)
Glucose-Capillary: 167 mg/dL — ABNORMAL HIGH (ref 70–99)
Glucose-Capillary: 130 mg/dL — ABNORMAL HIGH (ref 70–99)
Glucose-Capillary: 213 mg/dL — ABNORMAL HIGH (ref 70–99)

## 2021-02-18 MED ORDER — SACUBITRIL-VALSARTAN 24-26 MG PO TABS
1.0000 | ORAL_TABLET | Freq: Two times a day (BID) | ORAL | Status: DC
  Administered 2021-02-18 – 2021-02-21 (×7): 1 via ORAL
  Filled 2021-02-18 (×7): qty 1

## 2021-02-18 NOTE — Progress Notes (Signed)
Occupational Therapy Evaluation Patient Details Name: Amanda Frederick MRN: [id number] DOB: 09-04-1970 Today's Date: 02/18/2021    History of Present Illness Presented with lower extremity swelling and worsening shortness of breath 6 weeks ago patient undergone ankle fracture repair.  She initially did well but afterwards her legs started to swell and she started to get more short of breath , reports for the past 2 wks had severe bilateral leg swelling abdominal girth increase, weight gain, she has been unable to lay flat for past 2 wks and has to sit up vertically to breathe. Has been checking her pulse ox at home and sometimes desat to 89% with exertion,  Notes occasional chest pain worse with exertion and has been coming on occasion, no CP now.  Her left leg that was operated on also has been swollen a bit more than her right, but both are edematous   she went to see her orthopedics doctor today and was told to come to emergency department to get evaluation for DVT/PE.  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  She is type I diabetic and her blood sugars been under good control.  She uses a pump.   Clinical Impression    Amanda Frederick was evaluated s/p the above impairments. PTA pt required assist for all ADLs, and completed functional mobility at a wc level. Pt reported that she spends about 18 hours daily in the bed since her ankle fx, and completes stand pivots to wc and [facility] with her husbands assistance. Upon evaluation, pt was min A for bed mobility and min A for sit<>stand. Pt required encouragement for all functional activity and verbal cues for problem solving and sequencing. Notable tightness in distal LLE. Pt benefits from continued Ot acutely to progress function in all ADLs and mobility. Recommend OP OT services.    Follow Up Recommendations  Outpatient OT;Supervision/Assistance - 24 hour    Equipment Recommendations  Tub/shower seat        Precautions / Restrictions Precautions Precautions: Fall Restrictions Weight Bearing Restrictions: Yes LLE Weight Bearing: Weight bearing as tolerated Other Position/Activity Restrictions: RN documented WBAT, but no orders for WB status in chart      Mobility Bed Mobility Overal bed mobility: Needs Assistance Bed Mobility: Supine to Sit;Sit to Supine     Supine to sit: Min guard Sit to supine: Min assist   General bed mobility comments: patient limited by weakness, pain in left LE    Transfers Overall transfer level: Needs assistance Equipment used: Rolling walker (2 wheeled) Transfers: Sit to/from Stand Sit to Stand: Min guard         General transfer comment: HR to 122 after functional mobility, recovered quickly after brief rest    Balance Overall balance assessment: Needs assistance Sitting-balance support: Feet supported;Single extremity supported Sitting balance-Leahy Scale: Good     Standing balance support: Bilateral upper extremity supported;During functional activity Standing balance-Leahy Scale: Poor Standing balance comment: reliant on B UE support, unable to tolerate much WBing on left LE, cannot get foot flat on floor.             ADL either performed or assessed with clinical judgement   ADL Overall ADL's : Needs assistance/impaired Eating/Feeding: Independent;Sitting   Grooming: Wash/dry hands;Wash/dry face;Oral care;Applying deodorant;Set up;Sitting   Upper Body Bathing: Sitting;Set up   Lower Body Bathing: Moderate assistance;Sit to/from stand   Upper Body Dressing : Set up;Sitting   Lower Body Dressing: Maximal assistance;Sit to/from stand  Toilet Transfer: Moderate assistance;Stand-pivot;BSC   Toileting- Clothing Manipulation and Hygiene: Moderate assistance;Sit to/from stand       Functional mobility during ADLs: Moderate assistance;Cueing for safety;Rolling walker General ADL Comments: vc throughout for sequencing and pain  management, incrased time for HR control     Vision Baseline Vision/History: No visual deficits Patient Visual Report: No change from baseline Vision Assessment?: No apparent visual deficits     Perception     Praxis      Pertinent Vitals/Pain Pain Assessment: Faces Faces Pain Scale: Hurts little more Pain Location: left LE adn lower back Pain Descriptors / Indicators: Discomfort;Tightness Pain Intervention(s): Monitored during session     Hand Dominance Right   Extremity/Trunk Assessment Upper Extremity Assessment Upper Extremity Assessment: Generalized weakness   Lower Extremity Assessment Lower Extremity Assessment: LLE deficits/detail LLE Deficits / Details: left LE is limited with movement due to reported tightness and stiff. Has pain with ankle DF LLE Coordination: decreased gross motor   Cervical / Trunk Assessment Cervical / Trunk Assessment: Normal   Communication Communication Communication: No difficulties   Cognition Arousal/Alertness: Awake/alert Behavior During Therapy: WFL for tasks assessed/performed;Anxious Overall Cognitive Status: Within Functional Limits for tasks assessed                    General Comments: pt anxious for all movement due to pain in lower back and LLE   General Comments  VSS on RA, resting HR 110-115, elevated to 122 after mobility and quickly recoved to 115 after short rest break    Exercises Other Exercises Other Exercises: seated LAQ and marching B x 15 reps each, assisted with DF on left as tolerated   Shoulder Instructions      Home Living Family/patient expects to be discharged to:: Private residence Living Arrangements: Spouse/significant other Available Help at Discharge: Family;Available PRN/intermittently Type of Home: House Home Access: Ramped entrance     Home Layout: One level     Bathroom Shower/Tub: Producer, television/film/video: Standard Bathroom Accessibility: Yes How Accessible:  Accessible via walker Home Equipment: Walker - 4 wheels;Wheelchair - manual;Bedside commode   Additional Comments: husband states he can take a few more days off to assist pt as needed.      Prior Functioning/Environment Level of Independence: Needs assistance  Gait / Transfers Assistance Needed: assist for stand pivot surface transfers ADL's / Homemaking Assistance Needed: husband assists with all ADLs Communication / Swallowing Assistance Needed: WFL Comments: patient has been able to transfer to toilet, otherwise using wheelchair since ankle fracture. Prior to that she was independent        OT Problem List: Decreased range of motion;Decreased strength;Decreased activity tolerance;Impaired balance (sitting and/or standing);Decreased safety awareness;Decreased knowledge of use of DME or AE;Decreased knowledge of precautions;Pain      OT Treatment/Interventions: Self-care/ADL training;Therapeutic exercise;Energy conservation;DME and/or AE instruction;Therapeutic activities;Patient/family education;Balance training    OT Goals(Current goals can be found in the care plan section) Acute Rehab OT Goals Patient Stated Goal: to feel better, return home OT Goal Formulation: With patient Time For Goal Achievement: 03/04/21 Potential to Achieve Goals: Fair ADL Goals Pt Will Perform Grooming: with min guard assist;standing Pt Will Perform Lower Body Bathing: with min guard assist;sit to/from stand Pt Will Perform Lower Body Dressing: with min guard assist;sit to/from stand Pt Will Transfer to Toilet: with min guard assist;stand pivot transfer;bedside commode Pt Will Perform Toileting - Clothing Manipulation and hygiene: with supervision;sit to/from stand  OT Frequency: Min 2X/week  AM-PAC OT "6 Clicks" Daily Activity     Outcome Measure Help from another person eating meals?: None Help from another person taking care of personal grooming?: A Little Help from another person toileting,  which includes using toliet, bedpan, or urinal?: A Lot Help from another person bathing (including washing, rinsing, drying)?: A Lot Help from another person to put on and taking off regular upper body clothing?: A Little Help from another person to put on and taking off regular lower body clothing?: A Lot 6 Click Score: 16   End of Session Equipment Utilized During Treatment: Rolling walker Nurse Communication: Mobility status  Activity Tolerance: Patient limited by pain Patient left: in bed;with call bell/phone within reach;with family/visitor present  OT Visit Diagnosis: Unsteadiness on feet (R26.81);Other abnormalities of gait and mobility (R26.89);Repeated falls (R29.6);Muscle weakness (generalized) (M62.81);Pain                Time: 1400-1420 OT Time Calculation (min): 20 min Charges:  OT General Charges $OT Visit: 1 Visit OT Evaluation $OT Eval Moderate Complexity: 1 Mod   Zaven Klemens A Katheren Jimmerson 02/18/2021, 2:29 PM

## 2021-02-18 NOTE — Progress Notes (Addendum)
Progress Note  Patient Name: Amanda Frederick Date of Encounter: 02/18/2021  Primary Cardiologist: Parke Poisson, MD  Subjective   Breathing better and can lay at 30 degrees without SOB.  Remains tachy  Inpatient Medications    Scheduled Meds:  aspirin EC  81 mg Oral Daily   aspirin EC  81 mg Oral Once   atorvastatin  80 mg Oral Daily   enoxaparin (LOVENOX) injection  40 mg Subcutaneous Q24H   furosemide  40 mg Intravenous Q12H   insulin pump   Subcutaneous Q4H   magnesium oxide  200 mg Oral Daily   pantoprazole  40 mg Oral Daily   potassium chloride  20 mEq Oral Daily   senna-docusate  1 tablet Oral BID   sodium chloride flush  3 mL Intravenous Q12H   spironolactone  12.5 mg Oral Daily   Continuous Infusions:  sodium chloride     PRN Meds: sodium chloride, acetaminophen **OR** acetaminophen, HYDROcodone-acetaminophen, oxyCODONE, sodium chloride flush, tiZANidine   Vital Signs    Vitals:   02/17/21 1307 02/17/21 2058 02/18/21 0346 02/18/21 0700  BP: (!) 132/93 125/85 120/83 (!) 129/91  Pulse: (!) 108   (!) 102  Resp: 18 20 17 14   Temp: 98.4 F (36.9 C) 97.9 F (36.6 C) 98.6 F (37 C) 98.5 F (36.9 C)  TempSrc: Oral Oral Oral Oral  SpO2:    99%  Weight:   88.3 kg   Height:        Intake/Output Summary (Last 24 hours) at 02/18/2021 0825 Last data filed at 02/18/2021 0700 Gross per 24 hour  Intake 836 ml  Output 3400 ml  Net -2564 ml    Filed Weights   02/16/21 0300 02/17/21 0436 02/18/21 0346  Weight: 90.8 kg 88.7 kg 88.3 kg    Telemetry    Sinus tachycardia  Personally reviewed.  ECG    No new EKG to review today Physical Exam   GEN: Well nourished, well developed in no acute distress HEENT: Normal NECK: No JVD; No carotid bruits LYMPHATICS: No lymphadenopathy CARDIAC:regular but tachy, no murmurs, rubs, gallops RESPIRATORY:  decreased BS at right base ABDOMEN: Soft, non-tender, non-distended MUSCULOSKELETAL:  No edema; No deformity   SKIN: Warm and dry NEUROLOGIC:  Alert and oriented x 3 PSYCHIATRIC:  Normal affect   Labs    Chemistry Recent Labs  Lab 02/15/21 0248 02/15/21 0409 02/16/21 1225 02/17/21 0230 02/17/21 1258  NA  --  142 138 138  --   K  --  3.6 3.7 3.8  --   CL  --  107 101 103  --   CO2  --  24 29 28   --   GLUCOSE  --  143* 102* 109*  --   BUN  --  15 14 17   --   CREATININE  --  0.73 0.82 0.77  --   CALCIUM  --  8.7* 8.8* 8.8*  --   PROT 6.1* 5.7*  --   --   --   ALBUMIN 3.3* 3.2*  --   --  3.0*  AST 30 26  --   --   --   ALT 36 34  --   --   --   ALKPHOS 199* 183*  --   --   --   BILITOT 1.1 1.0  --   --   --   GFRNONAA  --  >60 >60 >60  --   ANIONGAP  --  11 8 7   --  Hematology Recent Labs  Lab 02/15/21 0249 02/15/21 0409 02/16/21 1225  WBC 7.3 7.4 8.4  RBC 5.51* 5.26* 5.43*  HGB 14.5 14.3 14.5  HCT 48.0* 45.2 45.6  MCV 87.1 85.9 84.0  MCH 26.3 27.2 26.7  MCHC 30.2 31.6 31.8  RDW 13.7 13.7 13.4  PLT 286 260 263     Cardiac Enzymes Recent Labs  Lab 02/14/21 1859 02/14/21 2121 02/15/21 0248 02/15/21 0409 02/16/21 1225  TROPONINIHS 27* 24* 23* 23* 22*     BNP Recent Labs  Lab 02/14/21 2121  BNP 624.2*      DDimer Recent Labs  Lab 02/15/21 0409  DDIMER 0.90*      Radiology    DG Chest 1 View  Result Date: 02/17/2021 CLINICAL DATA:  Status post thoracentesis EXAM: CHEST  1 VIEW COMPARISON:  02/14/2021 chest radiograph. FINDINGS: Stable cardiomediastinal silhouette with normal heart size. No pneumothorax. Small left pleural effusion, decreased. No significant right pleural effusion. No pulmonary edema. Improved left lung base aeration with decreased left lung base patchy opacity. Similar mild patchy and curvilinear opacities at the peripheral right lung base. IMPRESSION: 1. No pneumothorax. Small left pleural effusion, decreased. 2. Improved left lung base aeration with decreased left lung base patchy opacity, favor atelectasis. 3. Stable mild  patchy and curvilinear opacities at the peripheral right lung base. Follow-up chest radiographs advised. Electronically Signed   By: Delbert Phenix M.D.   On: 02/17/2021 13:14   US THORACENTESIS ASP PLEURAL SPACE W/IMG GUIDE  Result Date: 02/17/2021 INDICATION: Bilateral pleural effusion, left greater than right. Request for therapeutic and diagnostic thoracentesis. EXAM: ULTRASOUND GUIDED LEFT THORACENTESIS MEDICATIONS: 10 mL 1% lidocaine COMPLICATIONS: None immediate. PROCEDURE: An ultrasound guided thoracentesis was thoroughly discussed with the patient and questions answered. The benefits, risks, alternatives and complications were also discussed. The patient understands and wishes to proceed with the procedure. Written consent was obtained. Ultrasound was performed to localize and mark an adequate pocket of fluid in the left chest. The area was then prepped and draped in the normal sterile fashion. 1% Lidocaine was used for local anesthesia. Under ultrasound guidance a 6 Fr Safe-T-Centesis catheter was introduced. Thoracentesis was performed. The catheter was removed and a dressing applied. FINDINGS: A total of approximately 700 cc of clear yellow fluid was removed. Samples were sent to the laboratory as requested by the clinical team. Post procedure chest X-ray reviewed, negative for pneumothorax. IMPRESSION: Successful ultrasound guided left thoracentesis yielding 700 cc of pleural fluid. Read by: Lawernce Ion, PA-C Electronically Signed   By: Irish Lack M.D.   On: 02/17/2021 13:33    Patient Profile     50 y.o. female with a history of type 1 diabetes mellitus, hypertension, hyperlipidemia, peripheral neuropathy, fibromyalgia, and ORIF 6 weeks ago for ankle fracture now presenting with newly documented systolic heart failure and cardiomyopathy.  Assessment & Plan    1.  Acute/subacute combined heart failure  -LVEF 25 to 30% and restrictive diastolic filling pattern associated with wall motion  abnormalities suggesting underlying ischemic heart disease.   -Possibility of prior myocarditis with COVID-19 infection last year to be considered as well.   -she put out 3.4L yesterday and is net neg 5.6L since admit -weight down 6lbs from admit -SCr stable at 0.77 today -still appears volume overloaded on exam -continue Lasix 40mg  IV BID and spiro 12.5mg  daily -she had been on Lisinopril prior to admission and has not had any doses since 7/22>>BP is good so start Entresto 24-26mg  BID  and follow renal function closely -consider addition of Carvedilol once euvolemic -cannot use Farxiga due to Type 1 DM -will plan right and left heart cath tomorrow -Shared Decision Making/Informed Consent The risks [stroke (1 in 1000), death (1 in 1000), kidney failure [usually temporary] (1 in 500), bleeding (1 in 200), allergic reaction [possibly serious] (1 in 200)], benefits (diagnostic support and management of coronary artery disease) and alternatives of a cardiac catheterization were discussed in detail with Ms. Klink and she is willing to proceed.   2.  Essential hypertension -BP stable -starting Entresto 24-26mg  BID  3.  Type 1 diabetes mellitus. -continue Insulin pump  4.  Mixed hyperlipidemia. -LDL 97 and Tag 60 on admit -continue atorvastatin 80mg  daily  I have spent a total of 35 minutes with patient reviewing 2D echo hospital notes , telemetry, EKGs, labs and examining patient as well as establishing an assessment and plan that was discussed with the patient.  > 50% of time was spent in direct patient care.      Signed, , MD  02/18/2021, 8:25 AM

## 2021-02-18 NOTE — Evaluation (Signed)
Physical Therapy Evaluation Patient Details Name: Amanda Frederick MRN: [id number] DOB: 12/06/1970 Today's Date: 02/18/2021   History of Present Illness  Presented with lower extremity swelling and worsening shortness of breath 6 weeks ago patient undergone ankle fracture repair.  She initially did well but afterwards her legs started to swell and she started to get more short of breath , reports for the past 2 wks had severe bilateral leg swelling abdominal girth increase, weight gain, she has been unable to lay flat for past 2 wks and has to sit up vertically to breathe. Has been checking her pulse ox at home and sometimes desat to 89% with exertion,  Notes occasional chest pain worse with exertion and has been coming on occasion, no CP now.  Her left leg that was operated on also has been swollen a bit more than her right, but both are edematous   she went to see her orthopedics doctor today and was told to come to emergency department to get evaluation for DVT/PE.  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  She is type I diabetic and her blood sugars been under good control.  She uses a pump.   Clinical Impression  Patient received in bed, husband at bedside. She is pleasant and agreeable to PT assessment. Patient has not been oob at all. HR at rest in mid 110s. Patient requires min guard for supine to sit. Able to sit unsupported at edge of bed, but cannot put left foot flat on floor due to pain and tightness in left calf. She requires min assist for sit to stand. Cues needed to put left foot on floor and weight shift to her left as tolerated for pre-gait exercise. Patient performed seated LE exercises and returned to supine with min assist. She is encouraged to move in bed and sit up on side of bed as desired for improved healing and mobility. Patient will continue to benefit from skilled PT while here to improve functional independence, strength and safety with  mobility.       Follow Up Recommendations Outpatient PT    Equipment Recommendations  None recommended by PT    Recommendations for Other Services       Precautions / Restrictions Precautions Precautions: Fall Restrictions Weight Bearing Restrictions: Yes LLE Weight Bearing: Weight bearing as tolerated Other Position/Activity Restrictions: RN documented WBAT, but no orders for WB status in chart      Mobility  Bed Mobility Overal bed mobility: Needs Assistance Bed Mobility: Supine to Sit;Sit to Supine     Supine to sit: Min guard Sit to supine: Min assist   General bed mobility comments: patient limited by weakness, pain in left LE    Transfers Overall transfer level: Needs assistance Equipment used: Rolling walker (2 wheeled) Transfers: Sit to/from Stand Sit to Stand: Min guard         General transfer comment: requires cues for left foot placement and to bear weight. Performed weight shifting onto left in standing as tolerated ( minimal). Pain and cramping in left calf and back.  O2 sats remained in 90%s with mobility, HR up to mid 120s.  Ambulation/Gait             General Gait Details: patient unable at this time  Stairs            Wheelchair Mobility    Modified Rankin (Stroke Patients Only)       Balance Overall balance assessment: Needs  assistance Sitting-balance support: Feet supported;Single extremity supported Sitting balance-Leahy Scale: Good     Standing balance support: Bilateral upper extremity supported;During functional activity Standing balance-Leahy Scale: Poor Standing balance comment: reliant on B UE support, unable to tolerate much WBing on left LE, cannot get foot flat on floor.                             Pertinent Vitals/Pain Pain Assessment: Faces Faces Pain Scale: Hurts little more Pain Location: left LE Pain Descriptors / Indicators: Discomfort;Tightness Pain Intervention(s): Monitored during  session;Repositioned    Home Living Family/patient expects to be discharged to:: Private residence Living Arrangements: Spouse/significant other Available Help at Discharge: Family;Available PRN/intermittently Type of Home: House Home Access: Ramped entrance     Home Layout: One level Home Equipment: Walker - 4 wheels;Wheelchair - manual      Prior Function           Comments: patient has been able to transfer to toilet, otherwise using wheelchair since ankle fracture. Prior to that she was independent     Hand Dominance        Extremity/Trunk Assessment   Upper Extremity Assessment Upper Extremity Assessment: Defer to OT evaluation;Overall [facility] for tasks assessed    Lower Extremity Assessment Lower Extremity Assessment: LLE deficits/detail LLE Deficits / Details: left LE is limited with movement due to reported tightness and stiff. Has pain with ankle DF LLE Coordination: decreased gross motor    Cervical / Trunk Assessment Cervical / Trunk Assessment: Normal  Communication   Communication: No difficulties  Cognition Arousal/Alertness: Awake/alert Behavior During Therapy: WFL for tasks assessed/performed Overall Cognitive Status: Within Functional Limits for tasks assessed                                        General Comments      Exercises Other Exercises Other Exercises: seated LAQ and marching B x 15 reps each, assisted with DF on left as tolerated   Assessment/Plan    PT Assessment Patient needs continued PT services  PT Problem List Decreased strength;Decreased mobility;Decreased range of motion;Decreased coordination;Decreased activity tolerance;Decreased balance;Decreased knowledge of use of DME;Pain;Cardiopulmonary status limiting activity       PT Treatment Interventions DME instruction;Therapeutic exercise;Gait training;Functional mobility training;Therapeutic activities;Patient/family education;Manual techniques    PT Goals  (Current goals can be found in the Care Plan section)  Acute Rehab PT Goals Patient Stated Goal: to feel better, return home PT Goal Formulation: With patient/family Time For Goal Achievement: 03/04/21 Potential to Achieve Goals: Good    Frequency Min 3X/week   Barriers to discharge        Co-evaluation               AM-PAC PT "6 Clicks" Mobility  Outcome Measure Help needed turning from your back to your side while in a flat bed without using bedrails?: A Little Help needed moving from lying on your back to sitting on the side of a flat bed without using bedrails?: A Little Help needed moving to and from a bed to a chair (including a wheelchair)?: A Little Help needed standing up from a chair using your arms (e.g., wheelchair or bedside chair)?: A Little Help needed to walk in hospital room?: Total Help needed climbing 3-5 steps with a railing? : Total 6 Click Score: 14  End of Session Equipment Utilized During Treatment: Gait belt;Oxygen Activity Tolerance: Patient limited by pain;Patient limited by fatigue Patient left: in bed;with call bell/phone within reach;with family/visitor present Nurse Communication: Mobility status PT Visit Diagnosis: Unsteadiness on feet (R26.81);Other abnormalities of gait and mobility (R26.89);Muscle weakness (generalized) (M62.81);Difficulty in walking, not elsewhere classified (R26.2);Pain;History of falling (Z91.81) Pain - Right/Left: Left Pain - part of body: Ankle and joints of foot;Leg    Time: 1200-1233 PT Time Calculation (min) (ACUTE ONLY): 33 min   Charges:   PT Evaluation $PT Eval Moderate Complexity: 1 Mod PT Treatments $Therapeutic Exercise: 8-22 mins        Dayshaun Whobrey, PT, GCS 02/18/21,1:00 PM

## 2021-02-18 NOTE — Progress Notes (Signed)
Heart Failure Stewardship Pharmacist Progress Note   PCP: Paulina Fusi, MD PCP-Cardiologist: Parke Poisson, MD    HPI:  50 yo F with PMH of T1DM, HTN, HLD, peripheral neuropathy, fibromyalgia, and recent ORIF s/p ankle fracture. She presented to the ED on 02/14/21 with shortness of breath and bilateral LE edema x 2 weeks. Negative for DVT. CXR showed mild interstitial edema and moderate pleural effusions. An ECHO was done on 02/15/21 and found to have LVEF of 25-30% with G3DD and normal RV. Pending [facility] tomorrow.   Current HF Medications: Furosemide 40 mg IV BID Entresto 24/26 mg BID Spironolactone 12.5 mg daily  Prior to admission HF Medications: Lisinopril 20 mg daily  Pertinent Lab Values: Serum creatinine 0.89, BUN 12, Potassium 4.1, Sodium 139, BNP 624.2, Magnesium 2.0   Vital Signs: Weight: 194 lbs (admission weight: 200 lbs) Blood pressure: 120-130/80s  Heart rate: 110s   Medication Assistance / Insurance Benefits Check: Does the patient have prescription insurance?  Yes Type of insurance plan: [employer]  Outpatient Pharmacy:  Prior to admission outpatient pharmacy: [facility] Is the patient willing to use [facility] TOC pharmacy at discharge? Yes Is the patient willing to transition their outpatient pharmacy to utilize a [facility] outpatient pharmacy?   Pending    Assessment: 1. Acute on chronic systolic CHF (EF 27-74%), pending R/LHC. NYHA class III symptoms. - Continue furosemide 40 mg IV BID - Consider starting carvedilol or metoprolol XL once euvolemic - Continue Entresto 24/26 mg BID - Continue spironolactone 12.5 mg daily. Consider increasing to 25 mg daily - No SGLT2i with T1DM   Plan: 1) Medication changes recommended at this time: - Continue current regimen; pending [employer] tomorrow  2) Patient assistance: - HF TOC appt made for 8/2   3)  Education  - To be completed prior to discharge  Sharen Hones, PharmD, BCPS Heart Failure Stewardship  Pharmacist Phone [phone]

## 2021-02-18 NOTE — Progress Notes (Signed)
PROGRESS NOTE    Amanda Frederick  YQM:[id number] DOB: 1971-06-25 DOA: 02/14/2021 PCP: Paulina Fusi, MD   Chief Complaint  Patient presents with   Leg Swelling   Shortness of Breath   Brief Narrative: 50 year old female with fibromyalgia diabetes mellitus on insulin pump, HTN, HLD, diabetic neuropathy status post bimalleolar ankle fracture repair on 6/10 by Dr. Magnus Ivan presents with bilateral lower extremity swelling, worsening shortness of breath x2 weeks and also abdominal girth increase, weight gain and inability to lay flat, occasional chest pain worse with exertion.  She was seen in the ED and admitted for acute systolic CHF, moderate to large pleural effusion.  Underwent left thoracentesis 700 cc of fluid removed by IR 7/24, seen by cardiology and being diuresed  Subjective:  Can't lay flat.\ lt knee bothering, on 2l [place]. Chest pressure on left "little bit", hr in 113. PASSING GAS. Husband at bedsie  Assessment & Plan:  Acute combined systolic and diastolic CHF: Continue IV diuresis with intake output Daily weight monitoring.  Continue low-salt diet.  Underwent thoracentesis, echo shows EF 25 to 30% restrictive diastolic filling pattern G3 DD severe left ventricular dysfunction.  Ongoing discussion regarding cardiac cath with cardiology.  On Entresto, 24-26,Aldactone 12.5 mg  IV Lasix 40 mg q 12h, his total net negative Net IO Since Admission: -6,334 mL [02/18/21 1413] .  Weight is decreasing as below [facility] Weights   02/16/21 0300 02/17/21 0436 02/18/21 0346  Weight: 90.8 kg 88.7 kg 88.3 kg    Elevated troponin likely demand ischemia with tachycardia CHF.  Per cardiology  Bilateral pleural effusion left greater than right status post thoracentesis likely cardiogenic.  Monitor closely.  Sinus tachycardia TSH normal monitor  DM: Blood sugar is well controlled, A1c stable 6.8 on insulin pump Recent Labs  Lab 02/17/21 1954 02/17/21 2311 02/18/21 0350 02/18/21 0721  02/18/21 1131  GLUCAP 183* 81 103* 91 167*    Essential hypertension: Blood pressure currently stable  ?OSA, needing 2l [place].  Will benefit with outpatient sleep apnea evaluations once acute issue resolved  Fibromyalgia Recent left ankle surgery postrepair PT OT, fall precaution pain control  Diet Order             Diet Carb Modified Fluid consistency: Thin; Room service appropriate? Yes  Diet effective now                   Patient's Body mass index is 31.42 kg/m.  DVT prophylaxis: enoxaparin (LOVENOX) injection 40 mg Start: 02/17/21 1000 Code Status:   Code Status: Full Code  Family Communication: plan of care discussed with patient at bedside. Status is: Inpatient Remains inpatient appropriate because:IV treatments appropriate due to intensity of illness or inability to take PO and Inpatient level of care appropriate due to severity of illness Dispo:  Patient From: Home  Planned Disposition: Home  Medically stable for discharge: No     Unresulted Labs (From admission, onward)     Start     Ordered   02/18/21 0500  Magnesium  Tomorrow morning,   R       Question:  Specimen collection method  Answer:  Lab=Lab collect   02/17/21 0548   02/18/21 0500  Phosphorus  Tomorrow morning,   R       Question:  Specimen collection method  Answer:  Lab=Lab collect   02/17/21 0548   02/17/21 1241  PH, Body Fluid  RELEASE UPON ORDERING,   TIMED  02/17/21 1241   02/17/21 1237  Culture, body fluid w Gram Stain-bottle  Once,   R        02/17/21 1237   02/17/21 0500  Basic metabolic panel  Daily,   R     Question:  Specimen collection method  Answer:  Lab=Lab collect   02/16/21 0819   02/15/21 0500  CBC  Every third day,   R (with TIMED occurrences)      02/15/21 0051   Signed and Held  Magnesium  Tomorrow morning,   R        Signed and Held   Signed and Held  Phosphorus  Tomorrow morning,   R        Signed and Held   Signed and Held  TSH  Tomorrow morning,   R         Signed and Held          Medications reviewed:  Scheduled Meds:  aspirin EC  81 mg Oral Daily   aspirin EC  81 mg Oral Once   atorvastatin  80 mg Oral Daily   enoxaparin (LOVENOX) injection  40 mg Subcutaneous Q24H   furosemide  40 mg Intravenous Q12H   insulin pump   Subcutaneous Q4H   magnesium oxide  200 mg Oral Daily   pantoprazole  40 mg Oral Daily   potassium chloride  20 mEq Oral Daily   sacubitril-valsartan  1 tablet Oral BID   senna-docusate  1 tablet Oral BID   sodium chloride flush  3 mL Intravenous Q12H   spironolactone  12.5 mg Oral Daily   Continuous Infusions:  sodium chloride     Consultants:see note  Procedures:see note Antimicrobials: Anti-infectives (From admission, onward)    None      Culture/Microbiology    Component Value Date/Time   SDES PLEURAL FLUID 02/17/2021 1237   SPECREQUEST NONE 02/17/2021 1237   CULT  03/02/2020 1935    NO GROWTH 5 DAYS Performed at [facility], [address] [place], [place] [address]    REPTSTATUS 02/17/2021 FINAL 02/17/2021 1237    Other culture-see note  Objective: Vitals: Today's Vitals   02/18/21 0112 02/18/21 0346 02/18/21 0459 02/18/21 0700  BP:  120/83  (!) 129/91  Pulse:    (!) 102  Resp:  17  14  Temp:  98.6 F (37 C)  98.5 F (36.9 C)  TempSrc:  Oral  Oral  SpO2:    99%  Weight:  88.3 kg    Height:      PainSc: Asleep  6      Intake/Output Summary (Last 24 hours) at 02/18/2021 0853 Last data filed at 02/18/2021 0700 Gross per 24 hour  Intake 836 ml  Output 3400 ml  Net -2564 ml   Filed Weights   02/16/21 0300 02/17/21 0436 02/18/21 0346  Weight: 90.8 kg 88.7 kg 88.3 kg   Weight change: -0.4 kg  Intake/Output from previous day: 07/24 0701 - 07/25 0700 In: 836 [P.O.:836] Out: 3400 [Urine:3400] Intake/Output this shift: No intake/output data recorded. Filed Weights   02/16/21 0300 02/17/21 0436 02/18/21 0346  Weight: 90.8 kg 88.7 kg 88.3 kg   Examination: General  exam: AAOx3, obese, on 2l [place]. HEENT:Oral mucosa moist, Ear/Nose WNL grossly,dentition normal. Respiratory system: bilaterally diminished, at base, no use of accessory muscle, non tender. Cardiovascular system: S1 & S2 +,No JVD. Gastrointestinal system: Abdomen soft, NT,ND, BS+. Nervous System:Alert, awake, moving extremities Extremities: mild leg, left LLE c/di  wound. edema, distal peripheral pulses palpable.  Skin: No rashes,no icterus. MSK: Normal muscle bulk,tone, power Data Reviewed: I have personally reviewed following labs and imaging studies CBC: Recent Labs  Lab 02/14/21 1859 02/15/21 0249 02/15/21 0409 02/16/21 1225  WBC 9.3 7.3 7.4 8.4  NEUTROABS 7.1  --  5.5  --   HGB 15.1* 14.5 14.3 14.5  HCT 48.3* 48.0* 45.2 45.6  MCV 86.3 87.1 85.9 84.0  PLT 296 286 260 263   Basic Metabolic Panel: Recent Labs  Lab 02/14/21 1859 02/15/21 0248 02/15/21 0409 02/16/21 1225 02/17/21 0230  NA 139  --  142 138 138  K 4.3  --  3.6 3.7 3.8  CL 107  --  107 101 103  CO2 24  --  24 29 28   GLUCOSE 235*  --  143* 102* 109*  BUN 18  --  15 14 17   CREATININE 0.93  --  0.73 0.82 0.77  CALCIUM 9.2  --  8.7* 8.8* 8.8*  MG  --  2.0  --  1.8  --   PHOS  --  4.6  --  4.2  --    GFR: Estimated Creatinine Clearance: 94.2 mL/min (by C-G formula based on SCr of 0.77 mg/dL). Liver Function Tests: Recent Labs  Lab 02/15/21 0248 02/15/21 0409 02/17/21 1258  AST 30 26  --   ALT 36 34  --   ALKPHOS 199* 183*  --   BILITOT 1.1 1.0  --   PROT 6.1* 5.7*  --   ALBUMIN 3.3* 3.2* 3.0*   No results for input(s): LIPASE, AMYLASE in the last 168 hours. No results for input(s): AMMONIA in the last 168 hours. Coagulation Profile: Recent Labs  Lab 02/14/21 1859  INR 1.1   Cardiac Enzymes: Recent Labs  Lab 02/15/21 0248  CKTOTAL 337*   BNP (last 3 results) No results for input(s): PROBNP in the last 8760 hours. HbA1C: No results for input(s): HGBA1C in the last 72 hours. CBG: Recent  Labs  Lab 02/17/21 1603 02/17/21 1954 02/17/21 2311 02/18/21 0350 02/18/21 0721  GLUCAP 180* 183* 81 103* 91   Lipid Profile: No results for input(s): CHOL, HDL, LDLCALC, TRIG, CHOLHDL, LDLDIRECT in the last 72 hours. Thyroid Function Tests: No results for input(s): TSH, T4TOTAL, FREET4, T3FREE, THYROIDAB in the last 72 hours. Anemia Panel: No results for input(s): VITAMINB12, FOLATE, FERRITIN, TIBC, IRON, RETICCTPCT in the last 72 hours. Sepsis Labs: No results for input(s): PROCALCITON, LATICACIDVEN in the last 168 hours.  Recent Results (from the past 240 hour(s))  Resp Panel by RT-PCR (Flu A&B, Covid) Nasopharyngeal Swab     Status: None   Collection Time: 02/14/21 11:12 PM   Specimen: Nasopharyngeal Swab; Nasopharyngeal(NP) swabs in vial transport medium  Result Value Ref Range Status   SARS Coronavirus 2 by RT PCR NEGATIVE NEGATIVE Final    Comment: (NOTE) SARS-CoV-2 target nucleic acids are NOT DETECTED.  The SARS-CoV-2 RNA is generally detectable in upper respiratory specimens during the acute phase of infection. The lowest concentration of SARS-CoV-2 viral copies this assay can detect is 138 copies/mL. A negative result does not preclude SARS-Cov-2 infection and should not be used as the sole basis for treatment or other patient management decisions. A negative result may occur with  improper specimen collection/handling, submission of specimen other than nasopharyngeal swab, presence of viral mutation(s) within the areas targeted by this assay, and inadequate number of viral copies(<138 copies/mL). A negative result must be combined with clinical observations,  patient history, and epidemiological information. The expected result is Negative.  Fact Sheet for Patients:  BloggerCourse.com  Fact Sheet for Healthcare Providers:  SeriousBroker.it  This test is no t yet approved or cleared by the [place] FDA and   has been authorized for detection and/or diagnosis of SARS-CoV-2 by FDA under an Emergency Use Authorization (EUA). This EUA will remain  in effect (meaning this test can be used) for the duration of the COVID-19 declaration under Section 564(b)(1) of the Act, 21 U.S.C.section 360bbb-3(b)(1), unless the authorization is terminated  or revoked sooner.       Influenza A by PCR NEGATIVE NEGATIVE Final   Influenza B by PCR NEGATIVE NEGATIVE Final    Comment: (NOTE) The Xpert Xpress SARS-CoV-2/FLU/RSV plus assay is intended as an aid in the diagnosis of influenza from Nasopharyngeal swab specimens and should not be used as a sole basis for treatment. Nasal washings and aspirates are unacceptable for Xpert Xpress SARS-CoV-2/FLU/RSV testing.  Fact Sheet for Patients: BloggerCourse.com  Fact Sheet for Healthcare Providers: SeriousBroker.it  This test is not yet approved or cleared by the [place] FDA and has been authorized for detection and/or diagnosis of SARS-CoV-2 by FDA under an Emergency Use Authorization (EUA). This EUA will remain in effect (meaning this test can be used) for the duration of the COVID-19 declaration under Section 564(b)(1) of the Act, 21 U.S.C. section 360bbb-3(b)(1), unless the authorization is terminated or revoked.  Performed at [facility] Lab, [address], [place], [place] [address]   Gram stain     Status: None   Collection Time: 02/17/21 12:37 PM   Specimen: Pleura  Result Value Ref Range Status   Specimen Description PLEURAL FLUID  Final   Special Requests NONE  Final   Gram Stain   Final    WBC PRESENT,BOTH PMN AND MONONUCLEAR NO ORGANISMS SEEN CYTOSPIN SMEAR Performed at [facility], [address] [place], [place] [address]    Report Status 02/17/2021 FINAL  Final     Radiology Studies: DG Chest 1 View  Result Date: 02/17/2021 CLINICAL DATA:  Status post thoracentesis  EXAM: CHEST  1 VIEW COMPARISON:  02/14/2021 chest radiograph. FINDINGS: Stable cardiomediastinal silhouette with normal heart size. No pneumothorax. Small left pleural effusion, decreased. No significant right pleural effusion. No pulmonary edema. Improved left lung base aeration with decreased left lung base patchy opacity. Similar mild patchy and curvilinear opacities at the peripheral right lung base. IMPRESSION: 1. No pneumothorax. Small left pleural effusion, decreased. 2. Improved left lung base aeration with decreased left lung base patchy opacity, favor atelectasis. 3. Stable mild patchy and curvilinear opacities at the peripheral right lung base. Follow-up chest radiographs advised. Electronically Signed   By: Delbert Phenix M.D.   On: 02/17/2021 13:14   US THORACENTESIS ASP PLEURAL SPACE W/IMG GUIDE  Result Date: 02/17/2021 INDICATION: Bilateral pleural effusion, left greater than right. Request for therapeutic and diagnostic thoracentesis. EXAM: ULTRASOUND GUIDED LEFT THORACENTESIS MEDICATIONS: 10 mL 1% lidocaine COMPLICATIONS: None immediate. PROCEDURE: An ultrasound guided thoracentesis was thoroughly discussed with the patient and questions answered. The benefits, risks, alternatives and complications were also discussed. The patient understands and wishes to proceed with the procedure. Written consent was obtained. Ultrasound was performed to localize and mark an adequate pocket of fluid in the left chest. The area was then prepped and draped in the normal sterile fashion. 1% Lidocaine was used for local anesthesia. Under ultrasound guidance a 6 Fr Safe-T-Centesis catheter was  introduced. Thoracentesis was performed. The catheter was removed and a dressing applied. FINDINGS: A total of approximately 700 cc of clear yellow fluid was removed. Samples were sent to the laboratory as requested by the clinical team. Post procedure chest X-ray reviewed, negative for pneumothorax. IMPRESSION: Successful  ultrasound guided left thoracentesis yielding 700 cc of pleural fluid. Read by: Lawernce IonAimee Han, PA-C Electronically Signed   By: Irish LackGlenn  Yamagata M.D.   On: 02/17/2021 13:33     LOS: 3 days   Lanae Boastamesh Aniston Christman, MD [facility]  02/18/2021, 8:53 AM

## 2021-02-19 ENCOUNTER — Inpatient Hospital Stay (HOSPITAL_COMMUNITY)
Admission: EM | Disposition: A | Discharge status: Home Health | Payer: Self-pay | Source: Home / Self Care | Attending: Internal Medicine

## 2021-02-19 ENCOUNTER — Inpatient Hospital Stay (HOSPITAL_COMMUNITY): Payer: 59

## 2021-02-19 ENCOUNTER — Encounter (HOSPITAL_COMMUNITY): Payer: Self-pay | Admitting: Interventional Cardiology

## 2021-02-19 DIAGNOSIS — I5021 Acute systolic (congestive) heart failure: Secondary | ICD-10-CM | POA: Diagnosis not present

## 2021-02-19 DIAGNOSIS — I2510 Atherosclerotic heart disease of native coronary artery without angina pectoris: Secondary | ICD-10-CM

## 2021-02-19 HISTORY — PX: RIGHT/LEFT HEART CATH AND CORONARY ANGIOGRAPHY: CATH118266

## 2021-02-19 HISTORY — PX: CORONARY STENT INTERVENTION: CATH118234

## 2021-02-19 LAB — GLUCOSE, CAPILLARY
Glucose-Capillary: 116 mg/dL — ABNORMAL HIGH (ref 70–99)
Glucose-Capillary: 116 mg/dL — ABNORMAL HIGH (ref 70–99)
Glucose-Capillary: 129 mg/dL — ABNORMAL HIGH (ref 70–99)
Glucose-Capillary: 129 mg/dL — ABNORMAL HIGH (ref 70–99)
Glucose-Capillary: 148 mg/dL — ABNORMAL HIGH (ref 70–99)
Glucose-Capillary: 158 mg/dL — ABNORMAL HIGH (ref 70–99)
Glucose-Capillary: 194 mg/dL — ABNORMAL HIGH (ref 70–99)
Glucose-Capillary: 213 mg/dL — ABNORMAL HIGH (ref 70–99)
Glucose-Capillary: 237 mg/dL — ABNORMAL HIGH (ref 70–99)

## 2021-02-19 LAB — MAGNESIUM: Magnesium: 2 mg/dL (ref 1.7–2.4)

## 2021-02-19 LAB — TSH: TSH: 2.108 u[IU]/mL (ref 0.350–4.500)

## 2021-02-19 LAB — POCT I-STAT 7, (LYTES, BLD GAS, ICA,H+H)
Acid-Base Excess: 5 mmol/L — ABNORMAL HIGH (ref 0.0–2.0)
Bicarbonate: 29.9 mmol/L — ABNORMAL HIGH (ref 20.0–28.0)
Calcium, Ion: 1.15 mmol/L (ref 1.15–1.40)
HCT: 50 % — ABNORMAL HIGH (ref 36.0–46.0)
Hemoglobin: 17 g/dL — ABNORMAL HIGH (ref 12.0–15.0)
O2 Saturation: 100 %
Potassium: 4 mmol/L (ref 3.5–5.1)
Sodium: 141 mmol/L (ref 135–145)
TCO2: 31 mmol/L (ref 22–32)
pCO2 arterial: 43.7 mmHg (ref 32.0–48.0)
pH, Arterial: 7.442 (ref 7.350–7.450)
pO2, Arterial: 195 mmHg — ABNORMAL HIGH (ref 83.0–108.0)

## 2021-02-19 LAB — BASIC METABOLIC PANEL
Anion gap: 9 (ref 5–15)
BUN: 17 mg/dL (ref 6–20)
CO2: 28 mmol/L (ref 22–32)
Calcium: 8.9 mg/dL (ref 8.9–10.3)
Chloride: 103 mmol/L (ref 98–111)
Creatinine, Ser: 0.88 mg/dL (ref 0.44–1.00)
GFR, Estimated: >60 mL/min (ref >60)
Glucose, Bld: 110 mg/dL — ABNORMAL HIGH (ref 70–99)
Potassium: 4.1 mmol/L (ref 3.5–5.1)
Sodium: 140 mmol/L (ref 135–145)

## 2021-02-19 LAB — POCT I-STAT EG7
Acid-Base Excess: 4 mmol/L — ABNORMAL HIGH (ref 0.0–2.0)
Acid-Base Excess: 5 mmol/L — ABNORMAL HIGH (ref 0.0–2.0)
Bicarbonate: 29.5 mmol/L — ABNORMAL HIGH (ref 20.0–28.0)
Bicarbonate: 30.5 mmol/L — ABNORMAL HIGH (ref 20.0–28.0)
Calcium, Ion: 1.17 mmol/L (ref 1.15–1.40)
Calcium, Ion: 1.2 mmol/L (ref 1.15–1.40)
HCT: 50 % — ABNORMAL HIGH (ref 36.0–46.0)
HCT: 51 % — ABNORMAL HIGH (ref 36.0–46.0)
Hemoglobin: 17 g/dL — ABNORMAL HIGH (ref 12.0–15.0)
Hemoglobin: 17.3 g/dL — ABNORMAL HIGH (ref 12.0–15.0)
O2 Saturation: 75 %
O2 Saturation: 75 %
Potassium: 4.2 mmol/L (ref 3.5–5.1)
Potassium: 4.2 mmol/L (ref 3.5–5.1)
Sodium: 140 mmol/L (ref 135–145)
Sodium: 140 mmol/L (ref 135–145)
TCO2: 31 mmol/L (ref 22–32)
TCO2: 32 mmol/L (ref 22–32)
pCO2, Ven: 45.1 mmHg (ref 44.0–60.0)
pCO2, Ven: 46.3 mmHg (ref 44.0–60.0)
pH, Ven: 7.424 (ref 7.250–7.430)
pH, Ven: 7.426 (ref 7.250–7.430)
pO2, Ven: 40 mmHg (ref 32.0–45.0)
pO2, Ven: 39 mmHg (ref 32.0–45.0)

## 2021-02-19 LAB — POCT ACTIVATED CLOTTING TIME
Activated Clotting Time: 289 seconds
Activated Clotting Time: 173 seconds

## 2021-02-19 LAB — BRAIN NATRIURETIC PEPTIDE: B Natriuretic Peptide: 259.9 pg/mL — ABNORMAL HIGH (ref 0.0–100.0)

## 2021-02-19 LAB — PHOSPHORUS: Phosphorus: 4.3 mg/dL (ref 2.5–4.6)

## 2021-02-19 SURGERY — RIGHT/LEFT HEART CATH AND CORONARY ANGIOGRAPHY
Anesthesia: LOCAL

## 2021-02-19 MED ORDER — TICAGRELOR 90 MG PO TABS
ORAL_TABLET | ORAL | Status: DC | PRN
  Administered 2021-02-19: 180 mg via ORAL

## 2021-02-19 MED ORDER — IOHEXOL 350 MG/ML SOLN
INTRAVENOUS | Status: DC | PRN
  Administered 2021-02-19: 80 mL

## 2021-02-19 MED ORDER — HEPARIN (PORCINE) IN NACL 1000-0.9 UT/500ML-% IV SOLN
INTRAVENOUS | Status: AC
  Filled 2021-02-19: qty 1000

## 2021-02-19 MED ORDER — VERAPAMIL HCL 2.5 MG/ML IV SOLN
INTRAVENOUS | Status: AC
  Filled 2021-02-19: qty 2

## 2021-02-19 MED ORDER — NITROGLYCERIN 1 MG/10 ML FOR IR/CATH LAB
INTRA_ARTERIAL | Status: DC | PRN
  Administered 2021-02-19: 400 ug via INTRA_ARTERIAL

## 2021-02-19 MED ORDER — LABETALOL HCL 5 MG/ML IV SOLN: 10.0000 mg | INTRAVENOUS | Status: AC | PRN

## 2021-02-19 MED ORDER — MIDAZOLAM HCL 2 MG/2ML IJ SOLN
INTRAMUSCULAR | Status: AC
  Filled 2021-02-19: qty 2

## 2021-02-19 MED ORDER — HYDRALAZINE HCL 20 MG/ML IJ SOLN: 10.0000 mg | INTRAMUSCULAR | Status: AC | PRN

## 2021-02-19 MED ORDER — ASPIRIN 81 MG PO CHEW: 81.0000 mg | CHEWABLE_TABLET | ORAL | Status: DC

## 2021-02-19 MED ORDER — SODIUM CHLORIDE 0.9% FLUSH: 3.0000 mL | INTRAVENOUS | Status: DC | PRN

## 2021-02-19 MED ORDER — HEPARIN SODIUM (PORCINE) 1000 UNIT/ML IJ SOLN
INTRAMUSCULAR | Status: DC | PRN
  Administered 2021-02-19: 6000 [IU] via INTRAVENOUS
  Administered 2021-02-19: 3000 [IU] via INTRAVENOUS
  Administered 2021-02-19: 4500 [IU] via INTRAVENOUS

## 2021-02-19 MED ORDER — MORPHINE SULFATE (PF) 2 MG/ML IV SOLN
INTRAVENOUS | Status: DC | PRN
  Administered 2021-02-19: 1 mg via INTRAVENOUS

## 2021-02-19 MED ORDER — LIDOCAINE HCL (PF) 1 % IJ SOLN
INTRAMUSCULAR | Status: DC | PRN
  Administered 2021-02-19: 1 mL
  Administered 2021-02-19: 2 mL

## 2021-02-19 MED ORDER — MORPHINE SULFATE (PF) 4 MG/ML IV SOLN
INTRAVENOUS | Status: AC
  Filled 2021-02-19: qty 1

## 2021-02-19 MED ORDER — HEPARIN (PORCINE) IN NACL 1000-0.9 UT/500ML-% IV SOLN
INTRAVENOUS | Status: DC | PRN
  Administered 2021-02-19: 500 mL

## 2021-02-19 MED ORDER — SODIUM CHLORIDE 0.9% FLUSH
3.0000 mL | Freq: Two times a day (BID) | INTRAVENOUS | Status: DC
  Administered 2021-02-19 (×2): 3 mL via INTRAVENOUS

## 2021-02-19 MED ORDER — NITROGLYCERIN 1 MG/10 ML FOR IR/CATH LAB
INTRA_ARTERIAL | Status: AC
  Filled 2021-02-19: qty 10

## 2021-02-19 MED ORDER — VERAPAMIL HCL 2.5 MG/ML IV SOLN
INTRAVENOUS | Status: DC | PRN
  Administered 2021-02-19 (×2): 10 mL via INTRA_ARTERIAL

## 2021-02-19 MED ORDER — SODIUM CHLORIDE 0.9% FLUSH
3.0000 mL | Freq: Two times a day (BID) | INTRAVENOUS | Status: DC
  Administered 2021-02-20 – 2021-02-21 (×3): 3 mL via INTRAVENOUS

## 2021-02-19 MED ORDER — TICAGRELOR 90 MG PO TABS
ORAL_TABLET | ORAL | Status: AC
  Filled 2021-02-19: qty 1

## 2021-02-19 MED ORDER — SODIUM CHLORIDE 0.9 % IV SOLN
INTRAVENOUS | Status: DC
Start: 2021-02-19 — End: 2021-02-19

## 2021-02-19 MED ORDER — MIDAZOLAM HCL 2 MG/2ML IJ SOLN
INTRAMUSCULAR | Status: DC | PRN
  Administered 2021-02-19: 1 mg via INTRAVENOUS

## 2021-02-19 MED ORDER — ONDANSETRON HCL 4 MG/2ML IJ SOLN: 4.0000 mg | Freq: Four times a day (QID) | INTRAMUSCULAR | Status: DC | PRN

## 2021-02-19 MED ORDER — SODIUM CHLORIDE 0.9 % IV SOLN: 250.0000 mL | INTRAVENOUS | Status: DC | PRN

## 2021-02-19 MED ORDER — LIDOCAINE HCL (PF) 1 % IJ SOLN
INTRAMUSCULAR | Status: AC
  Filled 2021-02-19: qty 30

## 2021-02-19 MED ORDER — DIAZEPAM 5 MG PO TABS
5.0000 mg | ORAL_TABLET | Freq: Three times a day (TID) | ORAL | Status: DC | PRN
  Administered 2021-02-19 – 2021-02-20 (×2): 5 mg via ORAL
  Filled 2021-02-19 (×2): qty 1

## 2021-02-19 MED ORDER — ASPIRIN 81 MG PO CHEW
81.0000 mg | CHEWABLE_TABLET | ORAL | Status: AC
  Administered 2021-02-19: 81 mg via ORAL
  Filled 2021-02-19: qty 1

## 2021-02-19 MED ORDER — HEPARIN (PORCINE) IN NACL 1000-0.9 UT/500ML-% IV SOLN
INTRAVENOUS | Status: DC | PRN
Start: 2021-02-19 — End: 2021-02-19
  Administered 2021-02-19: 500 mL

## 2021-02-19 MED ORDER — ACETAMINOPHEN 325 MG PO TABS: 650.0000 mg | ORAL_TABLET | ORAL | Status: DC | PRN

## 2021-02-19 MED ORDER — TICAGRELOR 90 MG PO TABS
90.0000 mg | ORAL_TABLET | Freq: Two times a day (BID) | ORAL | Status: DC
  Administered 2021-02-19 – 2021-02-21 (×4): 90 mg via ORAL
  Filled 2021-02-19 (×4): qty 1

## 2021-02-19 MED ORDER — HEPARIN SODIUM (PORCINE) 1000 UNIT/ML IJ SOLN
INTRAMUSCULAR | Status: AC
  Filled 2021-02-19: qty 1

## 2021-02-19 MED ORDER — ASPIRIN 81 MG PO CHEW
81.0000 mg | CHEWABLE_TABLET | Freq: Every day | ORAL | Status: DC
  Administered 2021-02-20 – 2021-02-21 (×2): 81 mg via ORAL
  Filled 2021-02-19 (×2): qty 1

## 2021-02-19 MED ORDER — SODIUM CHLORIDE 0.9 % IV SOLN: INTRAVENOUS | Status: AC

## 2021-02-19 SURGICAL SUPPLY — 19 items
BALLN SAPPHIRE 2.5X15 (BALLOONS) ×2
BALLN SAPPHIRE ~~LOC~~ 3.25X15 (BALLOONS) ×1 IMPLANT
BALLOON SAPPHIRE 2.5X15 (BALLOONS) IMPLANT
CATH 5FR JL3.5 JR4 ANG PIG MP (CATHETERS) ×1 IMPLANT
CATH BALLN WEDGE 5F 110CM (CATHETERS) ×1 IMPLANT
CATH LAUNCHER 6FR EBU 3 (CATHETERS) ×1 IMPLANT
DEVICE RAD COMP TR BAND LRG (VASCULAR PRODUCTS) ×1 IMPLANT
GLIDESHEATH SLEND SS 6F .021 (SHEATH) ×1 IMPLANT
GUIDEWIRE INQWIRE 1.5J.035X260 (WIRE) IMPLANT
INQWIRE 1.5J .035X260CM (WIRE) ×2
KIT ENCORE 26 ADVANTAGE (KITS) ×1 IMPLANT
KIT HEART LEFT (KITS) ×2 IMPLANT
PACK CARDIAC CATHETERIZATION (CUSTOM PROCEDURE TRAY) ×2 IMPLANT
SHEATH GLIDE SLENDER 4/5FR (SHEATH) ×1 IMPLANT
STENT ONYX FRONTIER 2.75X22 (Permanent Stent) ×1 IMPLANT
TRANSDUCER W/STOPCOCK (MISCELLANEOUS) ×2 IMPLANT
TUBING CIL FLEX 10 FLL-RA (TUBING) ×2 IMPLANT
VALVE COPILOT STAT (MISCELLANEOUS) ×1 IMPLANT
WIRE ASAHI PROWATER 180CM (WIRE) ×1 IMPLANT

## 2021-02-19 NOTE — Progress Notes (Signed)
Heart Failure Stewardship Pharmacist Progress Note   PCP: Paulina Fusi, MD PCP-Cardiologist: Parke Poisson, MD    HPI:  50 yo F with PMH of T1DM, HTN, HLD, peripheral neuropathy, fibromyalgia, and recent ORIF s/p ankle fracture. She presented to the ED on 02/14/21 with shortness of breath and bilateral LE edema x 2 weeks. Negative for DVT. CXR showed mild interstitial edema and moderate pleural effusions. An ECHO was done on 02/15/21 and found to have LVEF of 25-30% with G3DD and normal RV. R/LHC done today and found to have 50% stenosis in LAD and 90% stenosis in midCx. S/p DES to mid circ. RA 6, PAP 23, PCWP 9, CO 5.02, CI 2.54.   Current HF Medications: Furosemide 40 mg IV BID Entresto 24/26 mg BID Spironolactone 12.5 mg daily  Prior to admission HF Medications: Lisinopril 20 mg daily  Pertinent Lab Values: Serum creatinine 0.88, BUN 17, Potassium 4.1, Sodium 140, BNP 624.2, Magnesium 2.0   Vital Signs: Weight: 176 lbs (admission weight: 200 lbs) Blood pressure: 130/80s  Heart rate: 110s   Medication Assistance / Insurance Benefits Check: Does the patient have prescription insurance?  Yes Type of insurance plan: [employer]  Outpatient Pharmacy:  Prior to admission outpatient pharmacy: [facility] Is the patient willing to use [facility] TOC pharmacy at discharge? Yes Is the patient willing to transition their outpatient pharmacy to utilize a [facility] outpatient pharmacy?   Pending    Assessment: 1. Acute on chronic systolic CHF (EF 67-20%), due to mixed ischemic and non ischemic cardiomyopathy. NYHA class III symptoms. - Consider transitioning to PO diuretic given RHC results from today - Consider starting carvedilol or metoprolol XL - Continue Entresto 24/26 mg BID - Continue spironolactone 12.5 mg daily. Consider increasing to 25 mg daily - No SGLT2i with T1DM   Plan: 1) Medication changes recommended at this time: - Transition to PO diuretic today - Increase  spironolactone to 25 mg daily  2) Patient assistance: - HF TOC appt made for 8/2   3)  Education  - To be completed prior to discharge  Sharen Hones, PharmD, BCPS Heart Failure Stewardship Pharmacist Phone [phone]

## 2021-02-19 NOTE — Progress Notes (Signed)
PROGRESS NOTE    Amanda Frederick  WNU:[id number] DOB: 19-May-1971 DOA: 02/14/2021 PCP: Paulina Fusi, MD   Chief Complaint  Patient presents with   Leg Swelling   Shortness of Breath   Brief Narrative: 50 year old female with fibromyalgia diabetes mellitus on insulin pump, HTN, HLD, diabetic neuropathy status post bimalleolar ankle fracture repair on 6/10 by Dr. Magnus Ivan presents with bilateral lower extremity swelling, worsening shortness of breath x2 weeks and also abdominal girth increase, weight gain and inability to lay flat, occasional chest pain worse with exertion.  She was seen in the ED and admitted for acute systolic CHF, moderate to large pleural effusion.  Underwent left thoracentesis 700 cc of fluid removed by IR 7/24, seen by cardiology and being diuresed  Subjective: Seen and examined this morning Still unable to lay flat, remains tachycardic Overnight afebrile mildly tachycardic on 1 L nasal cannula oxygen Renal function remained stable,  Assessment & Plan:  Acute combined systolic and diastolic CHF: Patient symptomatic. Underwent thoracentesis, echo shows EF 25 to 30% restrictive diastolic filling pattern G3 DD severe left ventricular dysfunction. Continue IV Lasix, add 40 mg every 12 currently, continue to monitor intake output Daily weight.  Weight significantly down this am. Ongoing discussion regarding cardiac cath with cardiology.  Noted plan for cardiac cath today. Cont on Entresto, 24-26,Aldactone 12.5 mg, Total net negative Net IO Since Admission: -9,254 mL [02/19/21 0807] .  Filed Weights   02/17/21 0436 02/18/21 0346 02/19/21 0538  Weight: 88.7 kg 88.3 kg 80 kg    Elevated troponin likely demand ischemia with tachycardia CHF.  Per cardiology see above  Bilateral pleural effusion left greater than right status post thoracentesis likely cardiogenic.  Continue diuretics as above.  Sinus tachycardia TSH normal monitor.  Not on beta-blocker due to low EF acute  CHF.  DM: Sugar well controlled, A1c stable 6.8 on insulin pump Recent Labs  Lab 02/18/21 1753 02/18/21 2116 02/19/21 0002 02/19/21 0527 02/19/21 0734  GLUCAP 130* 213* 129* 148* 116*   Essential hypertension: Well-controlled continue CHF meds as #1  ?OSA, needing 2l [place].  Will benefit with outpatient sleep apnea evaluations once acute issue resolved  Fibromyalgia Recent left ankle surgery postrepair PT OT, fall precaution pain control.  Diet Order             Diet NPO time specified Except for: Sips with Meds  Diet effective midnight                   Patient's Body mass index is 28.47 kg/m.  DVT prophylaxis: enoxaparin (LOVENOX) injection 40 mg Start: 02/17/21 1000 Code Status:   Code Status: Full Code  Family Communication: plan of care discussed with patient at bedside. Status is: Inpatient Remains inpatient appropriate because:IV treatments appropriate due to intensity of illness or inability to take PO and Inpatient level of care appropriate due to severity of illness Dispo:  Patient From: Home  Planned Disposition: Home  Medically stable for discharge: No     Unresulted Labs (From admission, onward)     Start     Ordered   02/17/21 1241  PH, Body Fluid  RELEASE UPON ORDERING,   TIMED        02/17/21 1241   02/15/21 0500  CBC  Every third day,   R      02/15/21 0051          Medications reviewed:  Scheduled Meds:  aspirin EC  81 mg Oral Daily   aspirin  EC  81 mg Oral Once   atorvastatin  80 mg Oral Daily   enoxaparin (LOVENOX) injection  40 mg Subcutaneous Q24H   furosemide  40 mg Intravenous Q12H   insulin pump   Subcutaneous Q4H   magnesium oxide  200 mg Oral Daily   pantoprazole  40 mg Oral Daily   potassium chloride  20 mEq Oral Daily   sacubitril-valsartan  1 tablet Oral BID   senna-docusate  1 tablet Oral BID   sodium chloride flush  3 mL Intravenous Q12H   sodium chloride flush  3 mL Intravenous Q12H   spironolactone  12.5 mg Oral  Daily   Continuous Infusions:  sodium chloride     sodium chloride     sodium chloride 50 mL/hr at 02/19/21 0631   Consultants:see note  Procedures:see note Antimicrobials: Anti-infectives (From admission, onward)    None      Culture/Microbiology    Component Value Date/Time   SDES PLEURAL FLUID 02/17/2021 1237   SDES PLEURAL FLUID 02/17/2021 1237   SPECREQUEST NONE 02/17/2021 1237   SPECREQUEST NONE 02/17/2021 1237   CULT  02/17/2021 1237    NO GROWTH 1 DAY Performed at [facility], [address], [place], [place] [address]    REPTSTATUS 02/17/2021 FINAL 02/17/2021 1237   REPTSTATUS PENDING 02/17/2021 1237    Other culture-see note  Objective: Vitals: Today's Vitals   02/18/21 2102 02/19/21 0004 02/19/21 0535 02/19/21 0538  BP: 137/89 (!) 134/97 (!) 127/99   Pulse: (!) 112 (!) 108 (!) 106   Resp: 17 20 16    Temp: 98.6 F (37 C) 98.4 F (36.9 C) 98.4 F (36.9 C)   TempSrc: Oral Oral Oral   SpO2: 99% 99% 100%   Weight:    80 kg  Height:      PainSc:        Intake/Output Summary (Last 24 hours) at 02/19/2021 0807 Last data filed at 02/19/2021 0700 Gross per 24 hour  Intake 830 ml  Output 4470 ml  Net -3640 ml    Filed Weights   02/17/21 0436 02/18/21 0346 02/19/21 0538  Weight: 88.7 kg 88.3 kg 80 kg   Weight change: -8.3 kg  Intake/Output from previous day: 07/25 0701 - 07/26 0700 In: 830 [P.O.:830] Out: 4470 [Urine:4470] Intake/Output this shift: No intake/output data recorded. Filed Weights   02/17/21 0436 02/18/21 0346 02/19/21 0538  Weight: 88.7 kg 88.3 kg 80 kg   Examination: General exam: AAOx 3, anxious,older than stated age, weak appearing. HEENT:Oral mucosa moist, Ear/Nose WNL grossly, dentition normal. Respiratory system: bilaterally diminished, unable to take deep breath as it gives her chest pain, no crackles, no use of accessory muscle Cardiovascular system: S1 & S2 +, No JVD,. Gastrointestinal system: Abdomen soft,NT,ND,  BS+ Nervous System:Alert, awake, moving extremities and grossly nonfocal Extremities: no edema, distal peripheral pulses palpable.  Skin: No rashes,no icterus. MSK: Normal muscle bulk,tone, power  Recent surgical wound on the left ankle c/d/i Data Reviewed: I have personally reviewed following labs and imaging studies CBC: Recent Labs  Lab 02/14/21 1859 02/15/21 0249 02/15/21 0409 02/16/21 1225 02/18/21 0841  WBC 9.3 7.3 7.4 8.4 6.9  NEUTROABS 7.1  --  5.5  --   --   HGB 15.1* 14.5 14.3 14.5 15.4*  HCT 48.3* 48.0* 45.2 45.6 48.7*  MCV 86.3 87.1 85.9 84.0 83.5  PLT 296 286 260 263 252    Basic Metabolic Panel: Recent Labs  Lab 02/15/21 0248 02/15/21 0409 02/16/21  1225 02/17/21 0230 02/18/21 0841 02/19/21 0023 02/19/21 0221  NA  --  142 138 138 139 140  --   K  --  3.6 3.7 3.8 4.1 4.1  --   CL  --  107 101 103 102 103  --   CO2  --  24 29 28 27 28   --   GLUCOSE  --  143* 102* 109* 115* 110*  --   BUN  --  15 14 17 12 17   --   CREATININE  --  0.73 0.82 0.77 0.89 0.88  --   CALCIUM  --  8.7* 8.8* 8.8* 9.2 8.9  --   MG 2.0  --  1.8  --  2.0  --  2.0  PHOS 4.6  --  4.2  --  4.8*  --  4.3    GFR: Estimated Creatinine Clearance: 81.6 mL/min (by C-G formula based on SCr of 0.88 mg/dL). Liver Function Tests: Recent Labs  Lab 02/15/21 0248 02/15/21 0409 02/17/21 1258  AST 30 26  --   ALT 36 34  --   ALKPHOS 199* 183*  --   BILITOT 1.1 1.0  --   PROT 6.1* 5.7*  --   ALBUMIN 3.3* 3.2* 3.0*    No results for input(s): LIPASE, AMYLASE in the last 168 hours. No results for input(s): AMMONIA in the last 168 hours. Coagulation Profile: Recent Labs  Lab 02/14/21 1859  INR 1.1    Cardiac Enzymes: Recent Labs  Lab 02/15/21 0248  CKTOTAL 337*    BNP (last 3 results) No results for input(s): PROBNP in the last 8760 hours. HbA1C: No results for input(s): HGBA1C in the last 72 hours. CBG: Recent Labs  Lab 02/18/21 1753 02/18/21 2116 02/19/21 0002  02/19/21 0527 02/19/21 0734  GLUCAP 130* 213* 129* 148* 116*    Lipid Profile: No results for input(s): CHOL, HDL, LDLCALC, TRIG, CHOLHDL, LDLDIRECT in the last 72 hours. Thyroid Function Tests: Recent Labs    02/19/21 0221  TSH 2.108   Anemia Panel: No results for input(s): VITAMINB12, FOLATE, FERRITIN, TIBC, IRON, RETICCTPCT in the last 72 hours. Sepsis Labs: No results for input(s): PROCALCITON, LATICACIDVEN in the last 168 hours.  Recent Results (from the past 240 hour(s))  Resp Panel by RT-PCR (Flu A&B, Covid) Nasopharyngeal Swab     Status: None   Collection Time: 02/14/21 11:12 PM   Specimen: Nasopharyngeal Swab; Nasopharyngeal(NP) swabs in vial transport medium  Result Value Ref Range Status   SARS Coronavirus 2 by RT PCR NEGATIVE NEGATIVE Final    Comment: (NOTE) SARS-CoV-2 target nucleic acids are NOT DETECTED.  The SARS-CoV-2 RNA is generally detectable in upper respiratory specimens during the acute phase of infection. The lowest concentration of SARS-CoV-2 viral copies this assay can detect is 138 copies/mL. A negative result does not preclude SARS-Cov-2 infection and should not be used as the sole basis for treatment or other patient management decisions. A negative result may occur with  improper specimen collection/handling, submission of specimen other than nasopharyngeal swab, presence of viral mutation(s) within the areas targeted by this assay, and inadequate number of viral copies(<138 copies/mL). A negative result must be combined with clinical observations, patient history, and epidemiological information. The expected result is Negative.  Fact Sheet for Patients:  BloggerCourse.comhttps://www.fda.gov/media/152166/download  Fact Sheet for Healthcare Providers:  SeriousBroker.ithttps://www.fda.gov/media/152162/download  This test is no t yet approved or cleared by the [place] FDA and  has been authorized for detection and/or diagnosis of SARS-CoV-2  by FDA under an  Emergency Use Authorization (EUA). This EUA will remain  in effect (meaning this test can be used) for the duration of the COVID-19 declaration under Section 564(b)(1) of the Act, 21 U.S.C.section 360bbb-3(b)(1), unless the authorization is terminated  or revoked sooner.       Influenza A by PCR NEGATIVE NEGATIVE Final   Influenza B by PCR NEGATIVE NEGATIVE Final    Comment: (NOTE) The Xpert Xpress SARS-CoV-2/FLU/RSV plus assay is intended as an aid in the diagnosis of influenza from Nasopharyngeal swab specimens and should not be used as a sole basis for treatment. Nasal washings and aspirates are unacceptable for Xpert Xpress SARS-CoV-2/FLU/RSV testing.  Fact Sheet for Patients: BloggerCourse.com  Fact Sheet for Healthcare Providers: SeriousBroker.it  This test is not yet approved or cleared by the [place] FDA and has been authorized for detection and/or diagnosis of SARS-CoV-2 by FDA under an Emergency Use Authorization (EUA). This EUA will remain in effect (meaning this test can be used) for the duration of the COVID-19 declaration under Section 564(b)(1) of the Act, 21 U.S.C. section 360bbb-3(b)(1), unless the authorization is terminated or revoked.  Performed at [facility], [address], [place], [place] [address]   Gram stain     Status: None   Collection Time: 02/17/21 12:37 PM   Specimen: Pleura  Result Value Ref Range Status   Specimen Description PLEURAL FLUID  Final   Special Requests NONE  Final   Gram Stain   Final    WBC PRESENT,BOTH PMN AND MONONUCLEAR NO ORGANISMS SEEN CYTOSPIN SMEAR Performed at [facility], [address], [place], [place] [address]    Report Status 02/17/2021 FINAL  Final  Culture, body fluid w Gram Stain-bottle     Status: None (Preliminary result)   Collection Time: 02/17/21 12:37 PM   Specimen: Pleura  Result Value Ref Range Status   Specimen Description  PLEURAL FLUID  Final   Special Requests NONE  Final   Culture   Final    NO GROWTH 1 DAY Performed at [facility], [address], [place], [place] [address]    Report Status PENDING  Incomplete      Radiology Studies: DG Chest 1 View  Result Date: 02/17/2021 CLINICAL DATA:  Status post thoracentesis EXAM: CHEST  1 VIEW COMPARISON:  02/14/2021 chest radiograph. FINDINGS: Stable cardiomediastinal silhouette with normal heart size. No pneumothorax. Small left pleural effusion, decreased. No significant right pleural effusion. No pulmonary edema. Improved left lung base aeration with decreased left lung base patchy opacity. Similar mild patchy and curvilinear opacities at the peripheral right lung base. IMPRESSION: 1. No pneumothorax. Small left pleural effusion, decreased. 2. Improved left lung base aeration with decreased left lung base patchy opacity, favor atelectasis. 3. Stable mild patchy and curvilinear opacities at the peripheral right lung base. Follow-up chest radiographs advised. Electronically Signed   By: Delbert Phenix M.D.   On: 02/17/2021 13:14   US THORACENTESIS ASP PLEURAL SPACE W/IMG GUIDE  Result Date: 02/17/2021 INDICATION: Bilateral pleural effusion, left greater than right. Request for therapeutic and diagnostic thoracentesis. EXAM: ULTRASOUND GUIDED LEFT THORACENTESIS MEDICATIONS: 10 mL 1% lidocaine COMPLICATIONS: None immediate. PROCEDURE: An ultrasound guided thoracentesis was thoroughly discussed with the patient and questions answered. The benefits, risks, alternatives and complications were also discussed. The patient understands and wishes to proceed with the procedure. Written consent was obtained. Ultrasound was performed to localize and mark an adequate pocket of fluid in  the left chest. The area was then prepped and draped in the normal sterile fashion. 1% Lidocaine was used for local anesthesia. Under ultrasound guidance a 6 Fr Safe-T-Centesis catheter was  introduced. Thoracentesis was performed. The catheter was removed and a dressing applied. FINDINGS: A total of approximately 700 cc of clear yellow fluid was removed. Samples were sent to the laboratory as requested by the clinical team. Post procedure chest X-ray reviewed, negative for pneumothorax. IMPRESSION: Successful ultrasound guided left thoracentesis yielding 700 cc of pleural fluid. Read by: Lawernce Ion, PA-C Electronically Signed   By: Irish Lack M.D.   On: 02/17/2021 13:33     LOS: 4 days   Lanae Boast, MD [facility]  02/19/2021, 8:07 AM

## 2021-02-19 NOTE — Interval H&P Note (Signed)
Cath Lab Visit (complete for each Cath Lab visit)  Clinical Evaluation Leading to the Procedure:   ACS: Yes.    Non-ACS:    Anginal Classification: CCS IV  Anti-ischemic medical therapy: Minimal Therapy (1 class of medications)  Non-Invasive Test Results: High-risk stress test findings: cardiac mortality >3%/year  Prior CABG: No previous CABG   Low EF by echo  History and Physical Interval Note:  02/19/2021 11:47 AM  Amanda Frederick  has presented today for surgery, with the diagnosis of heart failure.  The various methods of treatment have been discussed with the patient and family. After consideration of risks, benefits and other options for treatment, the patient has consented to  Procedure(s): RIGHT/LEFT HEART CATH AND CORONARY ANGIOGRAPHY (N/A) as a surgical intervention.  The patient's history has been reviewed, patient examined, no change in status, stable for surgery.  I have reviewed the patient's chart and labs.  Questions were answered to the patient's satisfaction.     Lance Muss

## 2021-02-19 NOTE — H&P (View-Only) (Signed)
Progress Note  Patient Name: [place] Date of Encounter: 02/19/2021  Primary Cardiologist: Parke Poisson, MD  Subjective   Still says has chest pressure and SOB when she lies back  Inpatient Medications    Scheduled Meds:  aspirin EC  81 mg Oral Daily   aspirin EC  81 mg Oral Once   atorvastatin  80 mg Oral Daily   enoxaparin (LOVENOX) injection  40 mg Subcutaneous Q24H   furosemide  40 mg Intravenous Q12H   insulin pump   Subcutaneous Q4H   magnesium oxide  200 mg Oral Daily   pantoprazole  40 mg Oral Daily   potassium chloride  20 mEq Oral Daily   sacubitril-valsartan  1 tablet Oral BID   senna-docusate  1 tablet Oral BID   sodium chloride flush  3 mL Intravenous Q12H   sodium chloride flush  3 mL Intravenous Q12H   spironolactone  12.5 mg Oral Daily   Continuous Infusions:  sodium chloride     sodium chloride     sodium chloride 50 mL/hr at 02/19/21 0631   PRN Meds: sodium chloride, sodium chloride, acetaminophen **OR** acetaminophen, HYDROcodone-acetaminophen, oxyCODONE, sodium chloride flush, sodium chloride flush, tiZANidine   Vital Signs    Vitals:   02/18/21 2102 02/19/21 0004 02/19/21 0535 02/19/21 0538  BP: 137/89 (!) 134/97 (!) 127/99   Pulse: (!) 112 (!) 108 (!) 106   Resp: 17 20 16    Temp: 98.6 F (37 C) 98.4 F (36.9 C) 98.4 F (36.9 C)   TempSrc: Oral Oral Oral   SpO2: 99% 99% 100%   Weight:    80 kg  Height:        Intake/Output Summary (Last 24 hours) at 02/19/2021 0750 Last data filed at 02/19/2021 0700 Gross per 24 hour  Intake 830 ml  Output 4470 ml  Net -3640 ml   Filed Weights   02/17/21 0436 02/18/21 0346 02/19/21 0538  Weight: 88.7 kg 88.3 kg 80 kg    Telemetry    SR, ST - Personally reviewed.  ECG    No new EKG to review today Physical Exam   General: Well developed, well nourished, female in no acute distress Head: Eyes PERRLA, Head normocephalic and atraumatic Lungs: clear bilaterally to  auscultation. Heart: HRRR S1 S2, without rub or gallop. No murmur. 4/4 extremity pulses are 2+ & equal. No JVD. Abdomen: Bowel sounds are present, abdomen soft and non-tender without masses or  hernias noted. Msk: Normal strength and tone for age. Extremities: No clubbing, cyanosis or edema.    Skin:  No rashes or lesions noted. Neuro: Alert and oriented X 3. Psych:  Good affect, responds appropriately  Labs    Chemistry Recent Labs  Lab 02/15/21 0248 02/15/21 0409 02/16/21 1225 02/17/21 0230 02/17/21 1258 02/18/21 0841 02/19/21 0023  NA  --  142   < > 138  --  139 140  K  --  3.6   < > 3.8  --  4.1 4.1  CL  --  107   < > 103  --  102 103  CO2  --  24   < > 28  --  27 28  GLUCOSE  --  143*   < > 109*  --  115* 110*  BUN  --  15   < > 17  --  12 17  CREATININE  --  0.73   < > 0.77  --  0.89 0.88  CALCIUM  --  8.7*   < > 8.8*  --  9.2 8.9  PROT 6.1* 5.7*  --   --   --   --   --   ALBUMIN 3.3* 3.2*  --   --  3.0*  --   --   AST 30 26  --   --   --   --   --   ALT 36 34  --   --   --   --   --   ALKPHOS 199* 183*  --   --   --   --   --   BILITOT 1.1 1.0  --   --   --   --   --   GFRNONAA  --  >60   < > >60  --  >60 >60  ANIONGAP  --  11   < > 7  --  10 9   < > = values in this interval not displayed.     Hematology Recent Labs  Lab 02/15/21 0409 02/16/21 1225 02/18/21 0841  WBC 7.4 8.4 6.9  RBC 5.26* 5.43* 5.83*  HGB 14.3 14.5 15.4*  HCT 45.2 45.6 48.7*  MCV 85.9 84.0 83.5  MCH 27.2 26.7 26.4  MCHC 31.6 31.8 31.6  RDW 13.7 13.4 13.4  PLT 260 263 252    Cardiac Enzymes Recent Labs  Lab 02/14/21 1859 02/14/21 2121 02/15/21 0248 02/15/21 0409 02/16/21 1225  TROPONINIHS 27* 24* 23* 23* 22*    BNP Recent Labs  Lab 02/14/21 2121  BNP 624.2*     DDimer Recent Labs  Lab 02/15/21 0409  DDIMER 0.90*   Lab Results  Component Value Date   TSH 2.108 02/19/2021      Radiology    DG Chest 1 View  Result Date: 02/17/2021 CLINICAL DATA:  Status  post thoracentesis EXAM: CHEST  1 VIEW COMPARISON:  02/14/2021 chest radiograph. FINDINGS: Stable cardiomediastinal silhouette with normal heart size. No pneumothorax. Small left pleural effusion, decreased. No significant right pleural effusion. No pulmonary edema. Improved left lung base aeration with decreased left lung base patchy opacity. Similar mild patchy and curvilinear opacities at the peripheral right lung base. IMPRESSION: 1. No pneumothorax. Small left pleural effusion, decreased. 2. Improved left lung base aeration with decreased left lung base patchy opacity, favor atelectasis. 3. Stable mild patchy and curvilinear opacities at the peripheral right lung base. Follow-up chest radiographs advised. Electronically Signed   By: Delbert Phenix M.D.   On: 02/17/2021 13:14   US THORACENTESIS ASP PLEURAL SPACE W/IMG GUIDE  Result Date: 02/17/2021 INDICATION: Bilateral pleural effusion, left greater than right. Request for therapeutic and diagnostic thoracentesis. EXAM: ULTRASOUND GUIDED LEFT THORACENTESIS MEDICATIONS: 10 mL 1% lidocaine COMPLICATIONS: None immediate. PROCEDURE: An ultrasound guided thoracentesis was thoroughly discussed with the patient and questions answered. The benefits, risks, alternatives and complications were also discussed. The patient understands and wishes to proceed with the procedure. Written consent was obtained. Ultrasound was performed to localize and mark an adequate pocket of fluid in the left chest. The area was then prepped and draped in the normal sterile fashion. 1% Lidocaine was used for local anesthesia. Under ultrasound guidance a 6 Fr Safe-T-Centesis catheter was introduced. Thoracentesis was performed. The catheter was removed and a dressing applied. FINDINGS: A total of approximately 700 cc of clear yellow fluid was removed. Samples were sent to the laboratory as requested by the clinical team. Post procedure chest X-ray reviewed, negative for pneumothorax.  IMPRESSION: Successful ultrasound guided left  thoracentesis yielding 700 cc of pleural fluid. Read by: Lawernce Ion, PA-C Electronically Signed   By: Irish Lack M.D.   On: 02/17/2021 13:33    Patient Profile     50 y.o. female with a history of type 1 diabetes mellitus, hypertension, hyperlipidemia, peripheral neuropathy, fibromyalgia, and ORIF 6 weeks ago for ankle fracture now presenting with newly documented systolic heart failure and cardiomyopathy.  Assessment & Plan    1.  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 - BUN/Cr stable  2.  Essential hypertension - tolerating Entresto and aldactone - add BB once more stable  3.  Type 1 diabetes mellitus. - hold insulin pump for cath, monitor CBGs closely - can do D5W if sugars drop  4.  Mixed hyperlipidemia. - on high-dose statin  5. Anxiety - pt admits this is a problem - does not take benzos at home - will add Valium prn, give a dose now, pre-cath  Signed, Theodore Demark, PA-C  02/19/2021, 7:50 AM

## 2021-02-19 NOTE — Progress Notes (Signed)
Progress Note  Patient Name: Amanda Frederick Date of Encounter: 02/19/2021  Primary Cardiologist: Parke Poisson, MD  Subjective   Still says has chest pressure and SOB when she lies back  Inpatient Medications    Scheduled Meds:  aspirin EC  81 mg Oral Daily   aspirin EC  81 mg Oral Once   atorvastatin  80 mg Oral Daily   enoxaparin (LOVENOX) injection  40 mg Subcutaneous Q24H   furosemide  40 mg Intravenous Q12H   insulin pump   Subcutaneous Q4H   magnesium oxide  200 mg Oral Daily   pantoprazole  40 mg Oral Daily   potassium chloride  20 mEq Oral Daily   sacubitril-valsartan  1 tablet Oral BID   senna-docusate  1 tablet Oral BID   sodium chloride flush  3 mL Intravenous Q12H   sodium chloride flush  3 mL Intravenous Q12H   spironolactone  12.5 mg Oral Daily   Continuous Infusions:  sodium chloride     sodium chloride     sodium chloride 50 mL/hr at 02/19/21 0631   PRN Meds: sodium chloride, sodium chloride, acetaminophen **OR** acetaminophen, HYDROcodone-acetaminophen, oxyCODONE, sodium chloride flush, sodium chloride flush, tiZANidine   Vital Signs    Vitals:   02/18/21 2102 02/19/21 0004 02/19/21 0535 02/19/21 0538  BP: 137/89 (!) 134/97 (!) 127/99   Pulse: (!) 112 (!) 108 (!) 106   Resp: 17 20 16    Temp: 98.6 F (37 C) 98.4 F (36.9 C) 98.4 F (36.9 C)   TempSrc: Oral Oral Oral   SpO2: 99% 99% 100%   Weight:    80 kg  Height:        Intake/Output Summary (Last 24 hours) at 02/19/2021 0750 Last data filed at 02/19/2021 0700 Gross per 24 hour  Intake 830 ml  Output 4470 ml  Net -3640 ml   Filed Weights   02/17/21 0436 02/18/21 0346 02/19/21 0538  Weight: 88.7 kg 88.3 kg 80 kg    Telemetry    SR, ST - Personally reviewed.  ECG    No new EKG to review today Physical Exam   General: Well developed, well nourished, female in no acute distress Head: Eyes PERRLA, Head normocephalic and atraumatic Lungs: clear bilaterally to  auscultation. Heart: HRRR S1 S2, without rub or gallop. No murmur. 4/4 extremity pulses are 2+ & equal. No JVD. Abdomen: Bowel sounds are present, abdomen soft and non-tender without masses or  hernias noted. Msk: Normal strength and tone for age. Extremities: No clubbing, cyanosis or edema.    Skin:  No rashes or lesions noted. Neuro: Alert and oriented X 3. Psych:  Good affect, responds appropriately  Labs    Chemistry Recent Labs  Lab 02/15/21 0248 02/15/21 0409 02/16/21 1225 02/17/21 0230 02/17/21 1258 02/18/21 0841 02/19/21 0023  NA  --  142   < > 138  --  139 140  K  --  3.6   < > 3.8  --  4.1 4.1  CL  --  107   < > 103  --  102 103  CO2  --  24   < > 28  --  27 28  GLUCOSE  --  143*   < > 109*  --  115* 110*  BUN  --  15   < > 17  --  12 17  CREATININE  --  0.73   < > 0.77  --  0.89 0.88  CALCIUM  --  8.7*   < > 8.8*  --  9.2 8.9  PROT 6.1* 5.7*  --   --   --   --   --   ALBUMIN 3.3* 3.2*  --   --  3.0*  --   --   AST 30 26  --   --   --   --   --   ALT 36 34  --   --   --   --   --   ALKPHOS 199* 183*  --   --   --   --   --   BILITOT 1.1 1.0  --   --   --   --   --   GFRNONAA  --  >60   < > >60  --  >60 >60  ANIONGAP  --  11   < > 7  --  10 9   < > = values in this interval not displayed.     Hematology Recent Labs  Lab 02/15/21 0409 02/16/21 1225 02/18/21 0841  WBC 7.4 8.4 6.9  RBC 5.26* 5.43* 5.83*  HGB 14.3 14.5 15.4*  HCT 45.2 45.6 48.7*  MCV 85.9 84.0 83.5  MCH 27.2 26.7 26.4  MCHC 31.6 31.8 31.6  RDW 13.7 13.4 13.4  PLT 260 263 252    Cardiac Enzymes Recent Labs  Lab 02/14/21 1859 02/14/21 2121 02/15/21 0248 02/15/21 0409 02/16/21 1225  TROPONINIHS 27* 24* 23* 23* 22*    BNP Recent Labs  Lab 02/14/21 2121  BNP 624.2*     DDimer Recent Labs  Lab 02/15/21 0409  DDIMER 0.90*   Lab Results  Component Value Date   TSH 2.108 02/19/2021      Radiology    DG Chest 1 View  Result Date: 02/17/2021 CLINICAL DATA:  Status  post thoracentesis EXAM: CHEST  1 VIEW COMPARISON:  02/14/2021 chest radiograph. FINDINGS: Stable cardiomediastinal silhouette with normal heart size. No pneumothorax. Small left pleural effusion, decreased. No significant right pleural effusion. No pulmonary edema. Improved left lung base aeration with decreased left lung base patchy opacity. Similar mild patchy and curvilinear opacities at the peripheral right lung base. IMPRESSION: 1. No pneumothorax. Small left pleural effusion, decreased. 2. Improved left lung base aeration with decreased left lung base patchy opacity, favor atelectasis. 3. Stable mild patchy and curvilinear opacities at the peripheral right lung base. Follow-up chest radiographs advised. Electronically Signed   By: Delbert Phenix M.D.   On: 02/17/2021 13:14   US THORACENTESIS ASP PLEURAL SPACE W/IMG GUIDE  Result Date: 02/17/2021 INDICATION: Bilateral pleural effusion, left greater than right. Request for therapeutic and diagnostic thoracentesis. EXAM: ULTRASOUND GUIDED LEFT THORACENTESIS MEDICATIONS: 10 mL 1% lidocaine COMPLICATIONS: None immediate. PROCEDURE: An ultrasound guided thoracentesis was thoroughly discussed with the patient and questions answered. The benefits, risks, alternatives and complications were also discussed. The patient understands and wishes to proceed with the procedure. Written consent was obtained. Ultrasound was performed to localize and mark an adequate pocket of fluid in the left chest. The area was then prepped and draped in the normal sterile fashion. 1% Lidocaine was used for local anesthesia. Under ultrasound guidance a 6 Fr Safe-T-Centesis catheter was introduced. Thoracentesis was performed. The catheter was removed and a dressing applied. FINDINGS: A total of approximately 700 cc of clear yellow fluid was removed. Samples were sent to the laboratory as requested by the clinical team. Post procedure chest X-ray reviewed, negative for pneumothorax.  IMPRESSION: Successful ultrasound guided left  thoracentesis yielding 700 cc of pleural fluid. Read by: Lawernce Ion, PA-C Electronically Signed   By: Irish Lack M.D.   On: 02/17/2021 13:33    Patient Profile     50 y.o. female with a history of type 1 diabetes mellitus, hypertension, hyperlipidemia, peripheral neuropathy, fibromyalgia, and ORIF 6 weeks ago for ankle fracture now presenting with newly documented systolic heart failure and cardiomyopathy.  Assessment & Plan    1.  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 - BUN/Cr stable  2.  Essential hypertension - tolerating Entresto and aldactone - add BB once more stable  3.  Type 1 diabetes mellitus. - hold insulin pump for cath, monitor CBGs closely - can do D5W if sugars drop  4.  Mixed hyperlipidemia. - on high-dose statin  5. Anxiety - pt admits this is a problem - does not take benzos at home - will add Valium prn, give a dose now, pre-cath  Signed, Theodore Demark, PA-C  02/19/2021, 7:50 AM

## 2021-02-20 ENCOUNTER — Inpatient Hospital Stay (HOSPITAL_COMMUNITY): Payer: 59

## 2021-02-20 DIAGNOSIS — I5021 Acute systolic (congestive) heart failure: Secondary | ICD-10-CM | POA: Diagnosis not present

## 2021-02-20 LAB — CBC
HCT: 46.8 % — ABNORMAL HIGH (ref 36.0–46.0)
Hemoglobin: 14.9 g/dL (ref 12.0–15.0)
MCH: 26.8 pg (ref 26.0–34.0)
MCHC: 31.8 g/dL (ref 30.0–36.0)
MCV: 84 fL (ref 80.0–100.0)
Platelets: 232 10*3/uL (ref 150–400)
RBC: 5.57 MIL/uL — ABNORMAL HIGH (ref 3.87–5.11)
RDW: 13.3 % (ref 11.5–15.5)
WBC: 6.1 10*3/uL (ref 4.0–10.5)
nRBC: 0 % (ref 0.0–0.2)

## 2021-02-20 LAB — GLUCOSE, CAPILLARY
Glucose-Capillary: 218 mg/dL — ABNORMAL HIGH (ref 70–99)
Glucose-Capillary: 147 mg/dL — ABNORMAL HIGH (ref 70–99)
Glucose-Capillary: 252 mg/dL — ABNORMAL HIGH (ref 70–99)
Glucose-Capillary: 165 mg/dL — ABNORMAL HIGH (ref 70–99)
Glucose-Capillary: 283 mg/dL — ABNORMAL HIGH (ref 70–99)

## 2021-02-20 LAB — PH, BODY FLUID: pH, Body Fluid: 7.6

## 2021-02-20 LAB — BASIC METABOLIC PANEL
Anion gap: 7 (ref 5–15)
BUN: 20 mg/dL (ref 6–20)
CO2: 26 mmol/L (ref 22–32)
Calcium: 8.8 mg/dL — ABNORMAL LOW (ref 8.9–10.3)
Chloride: 104 mmol/L (ref 98–111)
Creatinine, Ser: 0.88 mg/dL (ref 0.44–1.00)
GFR, Estimated: >60 mL/min (ref >60)
Glucose, Bld: 160 mg/dL — ABNORMAL HIGH (ref 70–99)
Potassium: 4.2 mmol/L (ref 3.5–5.1)
Sodium: 137 mmol/L (ref 135–145)

## 2021-02-20 MED ORDER — IOHEXOL 350 MG/ML SOLN
80.0000 mL | Freq: Once | INTRAVENOUS | Status: AC | PRN
  Administered 2021-02-20: 80 mL via INTRAVENOUS

## 2021-02-20 MED FILL — Verapamil HCl IV Soln 2.5 MG/ML: INTRAVENOUS | Qty: 2 | Status: AC

## 2021-02-20 MED FILL — Morphine Sulfate Inj 4 MG/ML: INTRAMUSCULAR | Qty: 0.25 | Status: AC

## 2021-02-20 NOTE — Progress Notes (Signed)
PROGRESS NOTE    Amanda Frederick  YIF:[id number] DOB: 1970/11/25 DOA: 02/14/2021 PCP: Paulina Fusi, MD   Chief Complaint  Patient presents with   Leg Swelling   Shortness of Breath   Brief Narrative: 50 year old female with fibromyalgia diabetes mellitus on insulin pump, HTN, HLD, diabetic neuropathy status post bimalleolar ankle fracture repair on 6/10 by Dr. Magnus Ivan presents with bilateral lower extremity swelling, worsening shortness of breath x2 weeks and also abdominal girth increase, weight gain and inability to lay flat, occasional chest pain worse with exertion.  She was seen in the ED and admitted for acute systolic CHF, moderate to large pleural effusion.  Underwent left thoracentesis 700 cc of fluid removed by IR 7/24, seen by cardiology and being diuresed Underwent cardiac cath with codominant coronary arteries with diffuse 50% stenosis in the LAD, OM1, OM 2 and D2 and 90% M LCx status post PCI  Subjective: Complains of difficulty in laying shortness of breath on laying down.  Shortness of breath worsening deep inspiration. Afebrile overnight renal function is stable. Saturating well on room air. Husband at the bedside Assessment & Plan:  Acute/subacute combined heart failure: cath showed mixed ischemic/nonischemic cardiomyopathy . OMV:[facility] 25 to 30% restrictive diastolic filling pattern G3 DD severe left ventricular dysfunction.  Continue on diuresis with close monitoring of intake output Daily weight as below with improving weight and net negative balance.  Holding Lasix and Aldactone today per cardiology, continue Entresto, 24-26.  Given patient's degree of shortness of breath, also with pleuritic chest pain shortness of breath cardiology planning for CT angio to rule out PE with-patient requesting Valium prior to CT as she may have difficulty laying flat. Net IO Since Admission: -10,173.69 mL [02/20/21 0814] .  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  Bilateral pleural effusion left greater than right status post thoracentesis likely cardiogenic.  Continue diuretics as above.  Sinus tachycardia TSH normal monitor.  Not on beta-blocker due to low EF acute CHF.  DM: well controlled on insulin pump.HBA1c stable 6.8 . Recent Labs  Lab 02/19/21 1555 02/19/21 2055 02/19/21 2350 02/20/21 0436 02/20/21 0800  GLUCAP 194* 237* 213* 218* 147*   Essential hypertension: Blood pressure well controlled continue CHF meds as #1.    ?OSA, needing 2l [place].  Will benefit with outpatient sleep apnea evaluations once acute issue resolved  Fibromyalgia  Recent left ankle surgery postrepair PT OT, fall precaution pain control.  Diet Order             Diet Heart Room service appropriate? Yes; Fluid consistency: Thin  Diet effective now                   Patient's Body mass index is 28.61 kg/m.  DVT prophylaxis: enoxaparin (LOVENOX) injection 40 mg Start: 02/17/21 1000 Code Status:   Code Status: Full Code  Family Communication: plan of care discussed with patient at bedside. Status is: Inpatient Remains inpatient appropriate because:IV treatments appropriate due to intensity of illness or inability to take PO and Inpatient level of care appropriate due to severity of illness Dispo:  Patient From: Home  Planned Disposition: Home  Medically stable for discharge: No     Unresulted Labs (From admission, onward)     Start     Ordered   02/20/21 0500  Basic metabolic panel  Daily,   R     Question:  Specimen collection method  Answer:  Lab=Lab collect   02/19/21 1340   02/17/21 1241  PH, Body Fluid  RELEASE UPON ORDERING,   TIMED        02/17/21 1241   02/15/21 0500  CBC  Every third day,   R      02/15/21 0051   Signed and Held  Basic metabolic panel  Tomorrow morning,    R       Question:  Specimen collection method  Answer:  Lab=Lab collect   Signed and Held          Medications reviewed:  Scheduled Meds:  aspirin  81 mg Oral Daily   atorvastatin  80 mg Oral Daily   enoxaparin (LOVENOX) injection  40 mg Subcutaneous Q24H   insulin pump   Subcutaneous Q4H   pantoprazole  40 mg Oral Daily   sacubitril-valsartan  1 tablet Oral BID   senna-docusate  1 tablet Oral BID   sodium chloride flush  3 mL Intravenous Q12H   sodium chloride flush  3 mL Intravenous Q12H   ticagrelor  90 mg Oral BID   Continuous Infusions:  sodium chloride     sodium chloride     Consultants:see note  Procedures:  Cardiac cath:02/19/2021   1st Mrg lesion is 50% stenosed.   2nd Mrg lesion is 50% stenosed.   2nd Diag lesion is 50% stenosed.   Mid LAD to Dist LAD lesion is 50% stenosed.   Mid Cx lesion is 90% stenosed.   A drug-eluting stent was successfully placed using a STENT ONYX FRONTIER 2.75X22, postdilated to 3.25 mm.   Post intervention, there is a 0% residual stenosis.   The left ventricular systolic function is normal.   LV end diastolic pressure is normal.   The left ventricular ejection fraction is 25-35% by visual estimate.   There is no aortic valve stenosis.   Ao sat 100%, PA sat 75%, PA pressure 34/15, mean PA pressure 23 mm Hg; mean PCWP 9 mm Hg; CO 5.02 L/min; CI 2.54   Continue aggressive medical therapy for her LV dysfunction.  Cardiomyopathy may have aspects of both ischemic and nonischemic.  Diffuse, small vessel disease noted in the diagonal, distal LAD and obtuse marginal vessels.   After 12 months of dual antiplatelet therapy with Brilinta, consider clopidogrel monotherapy given her diffuse CAD.  Antimicrobials: Anti-infectives (From admission, onward)    None      Culture/Microbiology    Component Value Date/Time   SDES PLEURAL FLUID 02/17/2021 1237   SDES PLEURAL FLUID 02/17/2021 1237   SPECREQUEST NONE 02/17/2021 1237   SPECREQUEST  NONE 02/17/2021 1237   CULT  02/17/2021 1237    NO GROWTH 2 DAYS Performed at [facility], [address] [place], [place] [address]    REPTSTATUS 02/17/2021 FINAL 02/17/2021 1237   REPTSTATUS PENDING 02/17/2021 1237    Other culture-see note  Objective: Vitals: Today's Vitals   02/20/21 0432 02/20/21 0448 02/20/21 0452 02/20/21 0533  BP: 92/63     Pulse: 97     Resp: 19     Temp: 97.6 F (36.4 C)     TempSrc: Oral     SpO2: 94%     Weight: 82 kg  80.4 kg   Height:      PainSc:  5   Asleep    Intake/Output Summary (Last 24 hours) at 02/20/2021 0814 Last  data filed at 02/20/2021 0433 Gross per 24 hour  Intake 930.31 ml  Output 1850 ml  Net -919.69 ml    Filed Weights   02/19/21 0538 02/20/21 0432 02/20/21 0452  Weight: 80 kg 82 kg 80.4 kg   Weight change: 2 kg  Intake/Output from previous day: 07/26 0701 - 07/27 0700 In: 930.3 [P.O.:510; I.V.:420.3] Out: 1850 [Urine:1850] Intake/Output this shift: No intake/output data recorded. Filed Weights   02/19/21 0538 02/20/21 0432 02/20/21 0452  Weight: 80 kg 82 kg 80.4 kg   Examination:  General exam: AAOx3, pleasant, not in distress. HEENT:Oral mucosa moist, Ear/Nose WNL grossly, dentition normal. Respiratory system: bilaterally diminished, with difficulty on deep breath,no use of accessory muscle Cardiovascular system: S1 & S2 +, No JVD,. Gastrointestinal system: Abdomen soft,NT,ND, BS+ Nervous System:Alert, awake, moving extremities and grossly nonfocal Extremities: left ankle with recent surgical wound c/d/I Skin: No rashes,no icterus. MSK: Normal muscle bulk,tone, power  Data Reviewed: I have personally reviewed following labs and imaging studies CBC: Recent Labs  Lab 02/14/21 1859 02/15/21 0249 02/15/21 0409 02/16/21 1225 02/18/21 0841 02/19/21 1214 02/19/21 1230 02/19/21 1231 02/20/21 0102  WBC 9.3 7.3 7.4 8.4 6.9  --   --   --  6.1  NEUTROABS 7.1  --  5.5  --   --   --   --   --   --    HGB 15.1* 14.5 14.3 14.5 15.4* 17.0* 17.3* 17.0* 14.9  HCT 48.3* 48.0* 45.2 45.6 48.7* 50.0* 51.0* 50.0* 46.8*  MCV 86.3 87.1 85.9 84.0 83.5  --   --   --  84.0  PLT 296 286 260 263 252  --   --   --  232    Basic Metabolic Panel: Recent Labs  Lab 02/15/21 0248 02/15/21 0409 02/16/21 1225 02/17/21 0230 02/18/21 0841 02/19/21 0023 02/19/21 0221 02/19/21 1214 02/19/21 1230 02/19/21 1231 02/20/21 0102  NA  --    < > 138 138 139 140  --  141 140 140 137  K  --    < > 3.7 3.8 4.1 4.1  --  4.0 4.2 4.2 4.2  CL  --    < > 101 103 102 103  --   --   --   --  104  CO2  --    < > 29 28 27 28   --   --   --   --  26  GLUCOSE  --    < > 102* 109* 115* 110*  --   --   --   --  160*  BUN  --    < > 14 17 12 17   --   --   --   --  20  CREATININE  --    < > 0.82 0.77 0.89 0.88  --   --   --   --  0.88  CALCIUM  --    < > 8.8* 8.8* 9.2 8.9  --   --   --   --  8.8*  MG 2.0  --  1.8  --  2.0  --  2.0  --   --   --   --   PHOS 4.6  --  4.2  --  4.8*  --  4.3  --   --   --   --    < > = values in this interval not displayed.    GFR: Estimated Creatinine Clearance: 81.7 mL/min (by C-G formula based  on SCr of 0.88 mg/dL). Liver Function Tests: Recent Labs  Lab 02/15/21 0248 02/15/21 0409 02/17/21 1258  AST 30 26  --   ALT 36 34  --   ALKPHOS 199* 183*  --   BILITOT 1.1 1.0  --   PROT 6.1* 5.7*  --   ALBUMIN 3.3* 3.2* 3.0*    No results for input(s): LIPASE, AMYLASE in the last 168 hours. No results for input(s): AMMONIA in the last 168 hours. Coagulation Profile: Recent Labs  Lab 02/14/21 1859  INR 1.1    Cardiac Enzymes: Recent Labs  Lab 02/15/21 0248  CKTOTAL 337*    BNP (last 3 results) No results for input(s): PROBNP in the last 8760 hours. HbA1C: No results for input(s): HGBA1C in the last 72 hours. CBG: Recent Labs  Lab 02/19/21 1555 02/19/21 2055 02/19/21 2350 02/20/21 0436 02/20/21 0800  GLUCAP 194* 237* 213* 218* 147*    Lipid Profile: No results for  input(s): CHOL, HDL, LDLCALC, TRIG, CHOLHDL, LDLDIRECT in the last 72 hours. Thyroid Function Tests: Recent Labs    02/19/21 0221  TSH 2.108    Anemia Panel: No results for input(s): VITAMINB12, FOLATE, FERRITIN, TIBC, IRON, RETICCTPCT in the last 72 hours. Sepsis Labs: No results for input(s): PROCALCITON, LATICACIDVEN in the last 168 hours.  Recent Results (from the past 240 hour(s))  Resp Panel by RT-PCR (Flu A&B, Covid) Nasopharyngeal Swab     Status: None   Collection Time: 02/14/21 11:12 PM   Specimen: Nasopharyngeal Swab; Nasopharyngeal(NP) swabs in vial transport medium  Result Value Ref Range Status   SARS Coronavirus 2 by RT PCR NEGATIVE NEGATIVE Final    Comment: (NOTE) SARS-CoV-2 target nucleic acids are NOT DETECTED.  The SARS-CoV-2 RNA is generally detectable in upper respiratory specimens during the acute phase of infection. The lowest concentration of SARS-CoV-2 viral copies this assay can detect is 138 copies/mL. A negative result does not preclude SARS-Cov-2 infection and should not be used as the sole basis for treatment or other patient management decisions. A negative result may occur with  improper specimen collection/handling, submission of specimen other than nasopharyngeal swab, presence of viral mutation(s) within the areas targeted by this assay, and inadequate number of viral copies(<138 copies/mL). A negative result must be combined with clinical observations, patient history, and epidemiological information. The expected result is Negative.  Fact Sheet for Patients:  BloggerCourse.com  Fact Sheet for Healthcare Providers:  SeriousBroker.it  This test is no t yet approved or cleared by the [place] FDA and  has been authorized for detection and/or diagnosis of SARS-CoV-2 by FDA under an Emergency Use Authorization (EUA). This EUA will remain  in effect (meaning this test can be used) for the  duration of the COVID-19 declaration under Section 564(b)(1) of the Act, 21 U.S.C.section 360bbb-3(b)(1), unless the authorization is terminated  or revoked sooner.       Influenza A by PCR NEGATIVE NEGATIVE Final   Influenza B by PCR NEGATIVE NEGATIVE Final    Comment: (NOTE) The Xpert Xpress SARS-CoV-2/FLU/RSV plus assay is intended as an aid in the diagnosis of influenza from Nasopharyngeal swab specimens and should not be used as a sole basis for treatment. Nasal washings and aspirates are unacceptable for Xpert Xpress SARS-CoV-2/FLU/RSV testing.  Fact Sheet for Patients: BloggerCourse.com  Fact Sheet for Healthcare Providers: SeriousBroker.it  This test is not yet approved or cleared by the [place] FDA and has been authorized for detection and/or diagnosis of SARS-CoV-2 by FDA  under an Emergency Use Authorization (EUA). This EUA will remain in effect (meaning this test can be used) for the duration of the COVID-19 declaration under Section 564(b)(1) of the Act, 21 U.S.C. section 360bbb-3(b)(1), unless the authorization is terminated or revoked.  Performed at [facility], [address] [place], [place] [address]   Gram stain     Status: None   Collection Time: 02/17/21 12:37 PM   Specimen: Pleura  Result Value Ref Range Status   Specimen Description PLEURAL FLUID  Final   Special Requests NONE  Final   Gram Stain   Final    WBC PRESENT,BOTH PMN AND MONONUCLEAR NO ORGANISMS SEEN CYTOSPIN SMEAR Performed at [facility], [address] [place], [place] [address]    Report Status 02/17/2021 FINAL  Final  Culture, body fluid w Gram Stain-bottle     Status: None (Preliminary result)   Collection Time: 02/17/21 12:37 PM   Specimen: Pleura  Result Value Ref Range Status   Specimen Description PLEURAL FLUID  Final   Special Requests NONE  Final   Culture   Final    NO GROWTH 2 DAYS Performed at  [facility], [address] [place], [place] [address]    Report Status PENDING  Incomplete      Radiology Studies: DG Chest 2 View  Result Date: 02/19/2021 CLINICAL DATA:  CHF.  Intermittent shortness of breath. EXAM: CHEST - 2 VIEW COMPARISON:  02/17/2021 and 03/02/2020 FINDINGS: Patchy densities in the right lower chest are similar to the previous examination. Suspect some of these densities are chronic based on the older comparison examination. Heart size is normal without overt pulmonary edema. Aeration at the left lung base has improved although there may still be very small pleural effusions based on the lateral view. Trachea is midline. No acute bone abnormality. IMPRESSION: 1. Improving aeration at the left lung base. 2. Probable very small pleural effusions. 3. No evidence for cardiomegaly or CHF. 4. Patchy densities in the right lower lung may represent a combination of acute on chronic changes. Findings are similar to the recent comparison examination. Electronically Signed   By: Richarda OverlieAdam  Henn M.D.   On: 02/19/2021 11:46   CARDIAC CATHETERIZATION  Result Date: 02/19/2021 Formatting of this result is different from the original.   1st Mrg lesion is 50% stenosed.   2nd Mrg lesion is 50% stenosed.   2nd Diag lesion is 50% stenosed.   Mid LAD to Dist LAD lesion is 50% stenosed.   Mid Cx lesion is 90% stenosed.   A drug-eluting stent was successfully placed using a STENT ONYX FRONTIER 2.75X22, postdilated to 3.25 mm.   Post intervention, there is a 0% residual stenosis.   The left ventricular systolic function is normal.   LV end diastolic pressure is normal.   The left ventricular ejection fraction is 25-35% by visual estimate.   There is no aortic valve stenosis.   Ao sat 100%, PA sat 75%, PA pressure 34/15, mean PA pressure 23 mm Hg; mean PCWP 9 mm Hg; CO 5.02 L/min; CI 2.54 Continue aggressive medical therapy for her LV dysfunction.  Cardiomyopathy may have aspects of both ischemic and  nonischemic.  Diffuse, small vessel disease noted in the diagonal, distal LAD and obtuse marginal vessels. After 12 months of dual antiplatelet therapy with Brilinta, consider clopidogrel monotherapy given her diffuse CAD.     LOS: 5 days   Lanae Boastamesh Ravis Herne, MD [facility]  02/20/2021, 8:14  AM

## 2021-02-20 NOTE — Progress Notes (Addendum)
Progress Note  Patient Name: Amanda Frederick Date of Encounter: 02/20/2021  Primary Cardiologist: Parke Poisson, MD  Subjective   Complainso f mild CP this am and sitting bolt upright complaining of SOB.   Cath yesterday with co dominant coronary arteries with diffuse 50% stenosis in the LAD, OM1 and OM2 and D2 and 90% mLCx s/p PCI.    Inpatient Medications    Scheduled Meds:  aspirin  81 mg Oral Daily   aspirin EC  81 mg Oral Once   atorvastatin  80 mg Oral Daily   enoxaparin (LOVENOX) injection  40 mg Subcutaneous Q24H   insulin pump   Subcutaneous Q4H   pantoprazole  40 mg Oral Daily   sacubitril-valsartan  1 tablet Oral BID   senna-docusate  1 tablet Oral BID   sodium chloride flush  3 mL Intravenous Q12H   sodium chloride flush  3 mL Intravenous Q12H   spironolactone  12.5 mg Oral Daily   ticagrelor  90 mg Oral BID   Continuous Infusions:  sodium chloride     sodium chloride     PRN Meds: sodium chloride, sodium chloride, acetaminophen **OR** acetaminophen, acetaminophen, diazepam, HYDROcodone-acetaminophen, ondansetron (ZOFRAN) IV, oxyCODONE, sodium chloride flush, sodium chloride flush, tiZANidine   Vital Signs    Vitals:   02/19/21 2054 02/19/21 2347 02/20/21 0432 02/20/21 0452  BP: 118/83 114/77 92/63   Pulse: (!) 104 (!) 104 97   Resp: 19 20 19    Temp: 97.6 F (36.4 C) 97.8 F (36.6 C) 97.6 F (36.4 C)   TempSrc: Oral Oral Oral   SpO2: 95% 97% 94%   Weight:   82 kg 80.4 kg  Height:        Intake/Output Summary (Last 24 hours) at 02/20/2021 0723 Last data filed at 02/20/2021 0433 Gross per 24 hour  Intake 930.31 ml  Output 1850 ml  Net -919.69 ml    Filed Weights   02/19/21 0538 02/20/21 0432 02/20/21 0452  Weight: 80 kg 82 kg 80.4 kg    Telemetry  NSR to ST- Personally reviewed.  ECG    No new EKG to review today Physical Exam   GGEN: Well nourished, well developed in no acute distress HEENT: Normal NECK: No JVD; No carotid  bruits LYMPHATICS: No lymphadenopathy CARDIAC:RRR, no murmurs, rubs, gallops RESPIRATORY:  Clear to auscultation without rales, wheezing or rhonchi  ABDOMEN: Soft, non-tender, non-distended MUSCULOSKELETAL:  No edema; No deformity  SKIN: Warm and dry NEUROLOGIC:  Alert and oriented x 3 PSYCHIATRIC:  Normal affect   Labs    Chemistry Recent Labs  Lab 02/15/21 0248 02/15/21 0409 02/16/21 1225 02/17/21 1258 02/18/21 0841 02/19/21 0023 02/19/21 1214 02/19/21 1230 02/19/21 1231 02/20/21 0102  NA  --  142   < >  --  139 140   < > 140 140 137  K  --  3.6   < >  --  4.1 4.1   < > 4.2 4.2 4.2  CL  --  107   < >  --  102 103  --   --   --  104  CO2  --  24   < >  --  27 28  --   --   --  26  GLUCOSE  --  143*   < >  --  115* 110*  --   --   --  160*  BUN  --  15   < >  --  12 17  --   --   --  20  CREATININE  --  0.73   < >  --  0.89 0.88  --   --   --  0.88  CALCIUM  --  8.7*   < >  --  9.2 8.9  --   --   --  8.8*  PROT 6.1* 5.7*  --   --   --   --   --   --   --   --   ALBUMIN 3.3* 3.2*  --  3.0*  --   --   --   --   --   --   AST 30 26  --   --   --   --   --   --   --   --   ALT 36 34  --   --   --   --   --   --   --   --   ALKPHOS 199* 183*  --   --   --   --   --   --   --   --   BILITOT 1.1 1.0  --   --   --   --   --   --   --   --   GFRNONAA  --  >60   < >  --  >60 >60  --   --   --  >60  ANIONGAP  --  11   < >  --  10 9  --   --   --  7   < > = values in this interval not displayed.      Hematology Recent Labs  Lab 02/16/21 1225 02/18/21 0841 02/19/21 1214 02/19/21 1230 02/19/21 1231 02/20/21 0102  WBC 8.4 6.9  --   --   --  6.1  RBC 5.43* 5.83*  --   --   --  5.57*  HGB 14.5 15.4*   < > 17.3* 17.0* 14.9  HCT 45.6 48.7*   < > 51.0* 50.0* 46.8*  MCV 84.0 83.5  --   --   --  84.0  MCH 26.7 26.4  --   --   --  26.8  MCHC 31.8 31.6  --   --   --  31.8  RDW 13.4 13.4  --   --   --  13.3  PLT 263 252  --   --   --  232   < > = values in this interval not  displayed.     Cardiac Enzymes Recent Labs  Lab 02/14/21 1859 02/14/21 2121 02/15/21 0248 02/15/21 0409 02/16/21 1225  TROPONINIHS 27* 24* 23* 23* 22*     BNP Recent Labs  Lab 02/14/21 2121 02/19/21 0834  BNP 624.2* 259.9*      DDimer Recent Labs  Lab 02/15/21 0409  DDIMER 0.90*    Lab Results  Component Value Date   TSH 2.108 02/19/2021      Radiology    DG Chest 2 View  Result Date: 02/19/2021 CLINICAL DATA:  CHF.  Intermittent shortness of breath. EXAM: CHEST - 2 VIEW COMPARISON:  02/17/2021 and 03/02/2020 FINDINGS: Patchy densities in the right lower chest are similar to the previous examination. Suspect some of these densities are chronic based on the older comparison examination. Heart size is normal without overt pulmonary edema. Aeration at the left lung base has improved although there may still be very small pleural effusions based on the lateral view. Trachea is  midline. No acute bone abnormality. IMPRESSION: 1. Improving aeration at the left lung base. 2. Probable very small pleural effusions. 3. No evidence for cardiomegaly or CHF. 4. Patchy densities in the right lower lung may represent a combination of acute on chronic changes. Findings are similar to the recent comparison examination. Electronically Signed   By: Richarda Overlie M.D.   On: 02/19/2021 11:46   CARDIAC CATHETERIZATION  Result Date: 02/19/2021 Formatting of this result is different from the original.   1st Mrg lesion is 50% stenosed.   2nd Mrg lesion is 50% stenosed.   2nd Diag lesion is 50% stenosed.   Mid LAD to Dist LAD lesion is 50% stenosed.   Mid Cx lesion is 90% stenosed.   A drug-eluting stent was successfully placed using a STENT ONYX FRONTIER 2.75X22, postdilated to 3.25 mm.   Post intervention, there is a 0% residual stenosis.   The left ventricular systolic function is normal.   LV end diastolic pressure is normal.   The left ventricular ejection fraction is 25-35% by visual estimate.    There is no aortic valve stenosis.   Ao sat 100%, PA sat 75%, PA pressure 34/15, mean PA pressure 23 mm Hg; mean PCWP 9 mm Hg; CO 5.02 L/min; CI 2.54 Continue aggressive medical therapy for her LV dysfunction.  Cardiomyopathy may have aspects of both ischemic and nonischemic.  Diffuse, small vessel disease noted in the diagonal, distal LAD and obtuse marginal vessels. After 12 months of dual antiplatelet therapy with Brilinta, consider clopidogrel monotherapy given her diffuse CAD.    Cardiac Cath 01/2021 Conclusion      1st Mrg lesion is 50% stenosed.   2nd Mrg lesion is 50% stenosed.   2nd Diag lesion is 50% stenosed.   Mid LAD to Dist LAD lesion is 50% stenosed.   Mid Cx lesion is 90% stenosed.   A drug-eluting stent was successfully placed using a STENT ONYX FRONTIER 2.75X22, postdilated to 3.25 mm.   Post intervention, there is a 0% residual stenosis.   The left ventricular systolic function is normal.   LV end diastolic pressure is normal.   The left ventricular ejection fraction is 25-35% by visual estimate.   There is no aortic valve stenosis.   Ao sat 100%, PA sat 75%, PA pressure 34/15, mean PA pressure 23 mm Hg; mean PCWP 9 mm Hg; CO 5.02 L/min; CI 2.54   Continue aggressive medical therapy for her LV dysfunction.  Cardiomyopathy may have aspects of both ischemic and nonischemic.  Diffuse, small vessel disease noted in the diagonal, distal LAD and obtuse marginal vessels.   After 12 months of dual antiplatelet therapy with Brilinta, consider clopidogrel monotherapy given her diffuse CAD.   Coronary Diagrams   Diagnostic Dominance: Right    Intervention     Patient Profile     50 y.o. female with a history of type 1 diabetes mellitus, hypertension, hyperlipidemia, peripheral neuropathy, fibromyalgia, and ORIF 6 weeks ago for ankle fracture now presenting with newly documented systolic heart failure and cardiomyopathy.  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 CO was 5 and CI 2.54. - she put out 1.85L yesterday and is net neg 10L this admit - wt down 3lbs from yesterday  and 23lbs from admit - SCr stable at 0.88 - appears euvolemic on exam today but continues to complain of significant SOB and sitting bolt upright in the bed - BP soft today likely from over diuresis so will hold spiro today - continue Entresto 24-26mg  BID - cannot use Jardiance or Farxiga due to type 1 DM - if BP improves hopefully can add on low dose carvedilol - I am still puzzled by her degree of SOB.  Her O2 sats are normal and no evidence of volume overload on exam or at cath.  O2 sats 94-97% on RA.  Her DDimer was minimally elevated but, with persistent tachycardia, I think we need to rule out PE.  TSH was normal.  - I will get a chest CTA  - I am suspicious that a lot of her SOB is anxiety related  ASCAD - hstrop minimally elevated at 24>23>23>22 -cath with diffuse LAD disease up to 50%, 50% OM1/OM2/D2 and 90% mLCX with co dominant system -s/p PCI of Lcx -continue DAPT with Brilinta 90mg  BID and ASA 81mg  daily -continue high dose statin -start Carvedilol when Bp allows  Essential hypertension - BP soft this am possibly due to over diuresis - continue Entresto  -  hold spiro  Type 1 diabetes mellitus. - continue Insulin pump - HbA1C 6.8% this admit  Mixed hyperlipidemia. - LDL goal < 70 - LDL was 97 this admit - on high-dose statin with atorvastatin 80mg  daily >added this admit - will need FLP and ALT in 6 weeks  Anxiety - pt admits this is a problem - does not take benzos at home - I think a lot of her SOB is actually related to severe anxiety  Will reassess VS tomorrow and hopefully BP will be stable enough to add  BB.  Hopefully home tomorrow.   I have spent a total of 35 minutes with patient reviewing cardiac cath, 2D echo , telemetry, EKGs, labs and examining patient as well as establishing an assessment and plan that was discussed with the patient.  > 50% of time was spent in direct patient care.     Signed, Armanda Magic, MD  02/20/2021, 7:23 AM

## 2021-02-20 NOTE — Progress Notes (Signed)
Heart Failure Stewardship Pharmacist Progress Note   PCP: Paulina Fusi, MD PCP-Cardiologist: Parke Poisson, MD    HPI:  50 yo F with PMH of T1DM, HTN, HLD, peripheral neuropathy, fibromyalgia, and recent ORIF s/p ankle fracture. She presented to the ED on 02/14/21 with shortness of breath and bilateral LE edema x 2 weeks. Negative for DVT. CXR showed mild interstitial edema and moderate pleural effusions. An ECHO was done on 02/15/21 and found to have LVEF of 25-30% with G3DD and normal RV. R/LHC done today and found to have 50% stenosis in LAD and 90% stenosis in midCx. S/p DES to mid circ. RA 6, PAP 23, PCWP 9, CO 5.02, CI 2.54.   Current HF Medications: Entresto 24/26 mg BID  Prior to admission HF Medications: Lisinopril 20 mg daily  Pertinent Lab Values: Serum creatinine 0.88, BUN 20, Potassium 4.2, Sodium 137, BNP 624.2, Magnesium 2.0   Vital Signs: Weight: 177 lbs (admission weight: 200 lbs) Blood pressure: 130/80s  Heart rate: 110s   Medication Assistance / Insurance Benefits Check: Does the patient have prescription insurance?  Yes Type of insurance plan: [employer]  Outpatient Pharmacy:  Prior to admission outpatient pharmacy: [facility] Is the patient willing to use [facility] TOC pharmacy at discharge? Yes Is the patient willing to transition their outpatient pharmacy to utilize a [facility] outpatient pharmacy?   Pending    Assessment: 1. Acute on chronic systolic CHF (EF 45-80%), due to mixed ischemic and non ischemic cardiomyopathy. NYHA class II symptoms. - Off IV lasix following RHC - Consider starting carvedilol or metoprolol XL once BP allows - Continue Entresto 24/26 mg BID - Spironolactone held yesterday with low BP and possible overdiuresis. Consider restarting tomorrow if able - No SGLT2i with T1DM   Plan: 1) Medication changes recommended at this time: - Start carvedilol vs spironolactone tomorrow  2) Patient assistance: - HF TOC appt made for  8/2   3)  Education  - To be completed prior to discharge  Sharen Hones, PharmD, BCPS Heart Failure Stewardship Pharmacist Phone [phone]

## 2021-02-20 NOTE — Progress Notes (Signed)
Physical Therapy Treatment Patient Details Name: Amanda Frederick MRN: [id number] DOB: 03-22-1971 Today's Date: 02/20/2021    History of Present Illness 50 y.o. female presents to [facility] ED on 02/14/2021, referred from outpatient ortho due to 2 weeks of LE and abdominal swelling and orthopnea. Pt had L ankle ORIF on 6 weeks ago to repair bimalleolar ankle fx. Pt admitted for acute CHF and pleural effusion management.  L thoracentesis completed 7/24. Cardiac cath completed 7/26. PMH includes fibromyalgia diabetes mellitus on insulin pump, HTN, HLD, diabetic neuropathy.    PT Comments    Pt tolerates treatment well this session, performing transfers and continuing gait training. PT treats session as NWB through LLE due to no documented updated weightbearing orders in chart. Dr. Magnus Ivan confirms pt is now WBAT through LLE via secure chat on 02/20/2021 at end of session. Pt will benefit from continued acute PT services to improve activity tolerance and progress gait training with updated weightbearing status.   Follow Up Recommendations  Home health PT     Equipment Recommendations  None recommended by PT    Recommendations for Other Services       Precautions / Restrictions Precautions Precautions: Fall Restrictions Weight Bearing Restrictions: Yes LLE Weight Bearing: Weight bearing as tolerated Other Position/Activity Restrictions: confirmed WBAT L ankle via secure chat with Dr. Magnus Ivan on 02/20/2021 upon completion of PT session. Pt treated as NWB L ankle during this session.    Mobility  Bed Mobility Overal bed mobility: Needs Assistance Bed Mobility: Supine to Sit;Sit to Supine     Supine to sit: Supervision Sit to supine: Supervision   General bed mobility comments: required simple one step vc    Transfers Overall transfer level: Needs assistance Equipment used: Rolling walker (2 wheeled) Transfers: Sit to/from Stand Sit to Stand: Min guard;From elevated surface             Ambulation/Gait Ambulation/Gait assistance: Min guard Gait Distance (Feet): 4 Feet (4' left and right x2, additional trial after seated break) Assistive device: Rolling walker (2 wheeled) Gait Pattern/deviations: Shuffle Gait velocity: reduced Gait velocity interpretation: <1.31 ft/sec, indicative of household ambulator General Gait Details: pt slides R foot along ground, shimmying and maintaining NWB through LLE (WBAT status confirmed with Dr. Magnus Ivan upon completion of session)   Stairs             Wheelchair Mobility    Modified Rankin (Stroke Patients Only)       Balance Overall balance assessment: Needs assistance Sitting-balance support: No upper extremity supported;Feet supported Sitting balance-Leahy Scale: Good     Standing balance support: Bilateral upper extremity supported Standing balance-Leahy Scale: Poor Standing balance comment: reliant on UE support of RW to maintain balance through SLS on RLE                            Cognition Arousal/Alertness: Awake/alert Behavior During Therapy: [facility] for tasks assessed/performed;Anxious Overall Cognitive Status: Within Functional Limits for tasks assessed                                 General Comments: pt anxious      Exercises Other Exercises Other Exercises: PT encourages continued ankle pumps to improve L ankle DF ROM    General Comments General comments (skin integrity, edema, etc.): pt tachy during session with resting HR in 110-120. HR elevates to 136 with mobility  Pertinent Vitals/Pain Pain Assessment: Faces Faces Pain Scale: Hurts little more Pain Location: LLE Pain Descriptors / Indicators: Grimacing Pain Intervention(s): Monitored during session    Home Living                      Prior Function            PT Goals (current goals can now be found in the care plan section) Acute Rehab PT Goals Patient Stated Goal: to feel better, return  home Progress towards PT goals: Progressing toward goals    Frequency    Min 3X/week      PT Plan Current plan remains appropriate    Co-evaluation              AM-PAC PT "6 Clicks" Mobility   Outcome Measure  Help needed turning from your back to your side while in a flat bed without using bedrails?: A Little Help needed moving from lying on your back to sitting on the side of a flat bed without using bedrails?: A Little Help needed moving to and from a bed to a chair (including a wheelchair)?: A Little Help needed standing up from a chair using your arms (e.g., wheelchair or bedside chair)?: A Little Help needed to walk in hospital room?: A Little Help needed climbing 3-5 steps with a railing? : Total 6 Click Score: 16    End of Session   Activity Tolerance: Patient tolerated treatment well Patient left: in bed;with call bell/phone within reach;with family/visitor present;with bed alarm set Nurse Communication: Mobility status PT Visit Diagnosis: Unsteadiness on feet (R26.81);Other abnormalities of gait and mobility (R26.89);Muscle weakness (generalized) (M62.81);Difficulty in walking, not elsewhere classified (R26.2);Pain;History of falling (Z91.81) Pain - Right/Left: Left Pain - part of body: Ankle and joints of foot;Leg     Time: 1240-1316 PT Time Calculation (min) (ACUTE ONLY): 36 min  Charges:  $Gait Training: 8-22 mins $Therapeutic Activity: 8-22 mins                     Arlyss Gandy, PT, DPT Acute Rehabilitation Pager: [phone]    Arlyss Gandy 02/20/2021, 1:36 PM

## 2021-02-20 NOTE — Plan of Care (Signed)
  Problem: Education: Goal: Knowledge of General Education information will improve Description Including pain rating scale, medication(s)/side effects and non-pharmacologic comfort measures Outcome: Progressing   

## 2021-02-20 NOTE — Progress Notes (Signed)
Pt just worked with PT. She has significant calf tightness and unable to walk hall yet. Discussed with pt and husband stent, Brilinta importance, restrictions, HF management, low sodium diet, NTG and CRPII. Very receptive, many questions. Have materials to review. Will refer to [facility] for after ankle healing.  [phone] Ethelda Chick CES, ACSM 3:36 PM 02/20/2021

## 2021-02-20 NOTE — Progress Notes (Signed)
Occupational Therapy Treatment Patient Details Name: Synai Prettyman MRN: [id number] DOB: [birth year] Today's Date: 02/20/2021    History of present illness Presented with lower extremity swelling and worsening shortness of breath 6 weeks ago patient undergone ankle fracture repair.  She initially did well but afterwards her legs started to swell and she started to get more short of breath , reports for the past 2 wks had severe bilateral leg swelling abdominal girth increase, weight gain, she has been unable to lay flat for past 2 wks and has to sit up vertically to breathe. Has been checking her pulse ox at home and sometimes desat to 89% with exertion,  Notes occasional chest pain worse with exertion and has been coming on occasion, no CP now.  Her left leg that was operated on also has been swollen a bit more than her right, but both are edematous   she went to see her orthopedics doctor today and was told to come to emergency department to get evaluation for DVT/PE.  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  She is type I diabetic and her blood sugars been under good control.  She uses a pump.   OT comments  Isabellamarie is progressing well towards her goals, she continues to be limited by pain and anxiety but benefits from simple vc's throughout all functional tasks. Pt was able to don lower body clothing while long sitting in the bed with mod A. She completed bed mobility with supervision A and increased time. Pt was able to complete BSC transfer with close min guard with use of RW and verbal cues for sequencing. Pt continues to benefit from OT acutely to progress function in ADLs and mobility. D/c plan remains appropriate.    Follow Up Recommendations  Outpatient OT;Supervision/Assistance - 24 hour    Equipment Recommendations  Tub/shower seat       Precautions / Restrictions Precautions Precautions: Fall Restrictions Weight Bearing Restrictions:  Yes LLE Weight Bearing: Weight bearing as tolerated       Mobility Bed Mobility Overal bed mobility: Needs Assistance Bed Mobility: Supine to Sit;Sit to Supine     Supine to sit: Supervision Sit to supine: Supervision   General bed mobility comments: required simple one step vc    Transfers Overall transfer level: Needs assistance Equipment used: Rolling walker (2 wheeled) Transfers: Sit to/from Stand Sit to Stand: Min guard;From elevated surface              Balance Overall balance assessment: Needs assistance Sitting-balance support: Feet supported Sitting balance-Leahy Scale: Good     Standing balance support: Single extremity supported;During functional activity Standing balance-Leahy Scale: Poor Standing balance comment: pt able to use 1UE for functional task in standing, with 1 UE supported on RW             ADL either performed or assessed with clinical judgement   ADL Overall ADL's : Needs assistance/impaired                     Lower Body Dressing: Moderate assistance;Sitting/lateral leans Lower Body Dressing Details (indicate cue type and reason): Pt able to don R sock while long sitting in bed, required assist for L sock Toilet Transfer: Min Probation officer Details (indicate cue type and reason): Pt required simple one step verbal cues throughout transfer. She was anxious for task due to pain and fear of falling Toileting- Architect and Hygiene: Min guard;Sit to/from stand  Toileting - Clothing Manipulation Details (indicate cue type and reason): pt able to complete pericare in standing with min guard for safety     Functional mobility during ADLs: Min guard;Cueing for safety;Rolling walker General ADL Comments: vc throughout for sequencing and problem solving. pt declined to attempt to bear weight through LLE     Vision   Vision Assessment?: No apparent visual deficits          Cognition  Arousal/Alertness: Awake/alert Behavior During Therapy: WFL for tasks assessed/performed;Anxious Overall Cognitive Status: Within Functional Limits for tasks assessed          General Comments: pt anxious              General Comments no new concerns this session    Pertinent Vitals/ Pain       Pain Assessment: Faces Faces Pain Scale: Hurts little more Pain Location: left LE and lower back Pain Descriptors / Indicators: Discomfort;Tightness Pain Intervention(s): Monitored during session  Frequency  Min 2X/week        Progress Toward Goals  OT Goals(current goals can now be found in the care plan section)  Progress towards OT goals: Progressing toward goals  Acute Rehab OT Goals Patient Stated Goal: to feel better, return home OT Goal Formulation: With patient Time For Goal Achievement: 03/04/21 Potential to Achieve Goals: Fair ADL Goals Pt Will Perform Grooming: with min guard assist;standing Pt Will Perform Lower Body Bathing: with min guard assist;sit to/from stand Pt Will Perform Lower Body Dressing: with min guard assist;sit to/from stand Pt Will Transfer to Toilet: with min guard assist;stand pivot transfer;bedside commode Pt Will Perform Toileting - Clothing Manipulation and hygiene: with supervision;sit to/from stand  Plan Discharge plan remains appropriate       AM-PAC OT "6 Clicks" Daily Activity     Outcome Measure   Help from another person eating meals?: None Help from another person taking care of personal grooming?: A Little Help from another person toileting, which includes using toliet, bedpan, or urinal?: A Little Help from another person bathing (including washing, rinsing, drying)?: A Lot Help from another person to put on and taking off regular upper body clothing?: A Little Help from another person to put on and taking off regular lower body clothing?: A Lot 6 Click Score: 17    End of Session Equipment Utilized During Treatment:  Rolling walker (BSC)  OT Visit Diagnosis: Unsteadiness on feet (R26.81);Other abnormalities of gait and mobility (R26.89);Repeated falls (R29.6);Muscle weakness (generalized) (M62.81);Pain   Activity Tolerance Patient limited by pain   Patient Left in bed;with call bell/phone within reach;with bed alarm set;with family/visitor present   Nurse Communication Mobility status        Time: 5885-0277 OT Time Calculation (min): 43 min  Charges: OT General Charges $OT Visit: 1 Visit OT Treatments $Self Care/Home Management : 38-52 mins     Tyreshia Ingman A Jenavive Lamboy 02/20/2021, 11:30 AM

## 2021-02-20 NOTE — Progress Notes (Signed)
   02/20/21 2200  Assess: MEWS Score  Pulse Rate (!) 112  ECG Heart Rate (!) 112  Resp 15  SpO2 97 %  Assess: MEWS Score  MEWS Temp 0  MEWS Systolic 0  MEWS Pulse 2  MEWS RR 0  MEWS LOC 0  MEWS Score 2  MEWS Score Color Yellow  Assess: if the MEWS score is Yellow or Red  Were vital signs taken at a resting state? Yes  Focused Assessment Change from prior assessment (see assessment flowsheet)  Early Detection of Sepsis Score *See Row Information* Medium  MEWS guidelines implemented *See Row Information* No, vital signs rechecked  Treat  Pain Scale 0-10  Pain Score 0  Take Vital Signs  Increase Vital Sign Frequency  Yellow: Q 2hr X 2 then Q 4hr X 2, if remains yellow, continue Q 4hrs  Escalate  MEWS: Escalate Yellow: discuss with charge nurse/RN and consider discussing with provider and RRT  Notify: Charge Nurse/RN  Name of Charge Nurse/RN Notified Therapist, nutritional  Date Charge Nurse/RN Notified 02/20/21  Time Charge Nurse/RN Notified 2300

## 2021-02-20 NOTE — Plan of Care (Signed)
  Problem: Education: Goal: Knowledge of General Education information will improve Description: Including pain rating scale, medication(s)/side effects and non-pharmacologic comfort measures 02/20/2021 1953 by Rema Fendt, RN Outcome: Progressing 02/20/2021 1953 by Rema Fendt, RN Outcome: Progressing

## 2021-02-21 ENCOUNTER — Other Ambulatory Visit: Payer: Self-pay | Admitting: Orthopaedic Surgery

## 2021-02-21 ENCOUNTER — Other Ambulatory Visit (HOSPITAL_COMMUNITY): Payer: Self-pay

## 2021-02-21 DIAGNOSIS — E782 Mixed hyperlipidemia: Secondary | ICD-10-CM

## 2021-02-21 DIAGNOSIS — I5021 Acute systolic (congestive) heart failure: Secondary | ICD-10-CM | POA: Diagnosis not present

## 2021-02-21 DIAGNOSIS — I5041 Acute combined systolic (congestive) and diastolic (congestive) heart failure: Secondary | ICD-10-CM

## 2021-02-21 LAB — GLUCOSE, CAPILLARY
Glucose-Capillary: 139 mg/dL — ABNORMAL HIGH (ref 70–99)
Glucose-Capillary: 174 mg/dL — ABNORMAL HIGH (ref 70–99)
Glucose-Capillary: 175 mg/dL — ABNORMAL HIGH (ref 70–99)
Glucose-Capillary: 199 mg/dL — ABNORMAL HIGH (ref 70–99)
Glucose-Capillary: 252 mg/dL — ABNORMAL HIGH (ref 70–99)

## 2021-02-21 LAB — BASIC METABOLIC PANEL
Anion gap: 8 (ref 5–15)
BUN: 15 mg/dL (ref 6–20)
CO2: 22 mmol/L (ref 22–32)
Calcium: 8.9 mg/dL (ref 8.9–10.3)
Chloride: 106 mmol/L (ref 98–111)
Creatinine, Ser: 0.68 mg/dL (ref 0.44–1.00)
GFR, Estimated: >60 mL/min (ref >60)
Glucose, Bld: 179 mg/dL — ABNORMAL HIGH (ref 70–99)
Potassium: 4 mmol/L (ref 3.5–5.1)
Sodium: 136 mmol/L (ref 135–145)

## 2021-02-21 LAB — CBC
HCT: 47.6 % — ABNORMAL HIGH (ref 36.0–46.0)
Hemoglobin: 15 g/dL (ref 12.0–15.0)
MCH: 26.5 pg (ref 26.0–34.0)
MCHC: 31.5 g/dL (ref 30.0–36.0)
MCV: 84.1 fL (ref 80.0–100.0)
Platelets: 223 10*3/uL (ref 150–400)
RBC: 5.66 MIL/uL — ABNORMAL HIGH (ref 3.87–5.11)
RDW: 13.5 % (ref 11.5–15.5)
WBC: 6.7 10*3/uL (ref 4.0–10.5)
nRBC: 0 % (ref 0.0–0.2)

## 2021-02-21 LAB — URINALYSIS, ROUTINE W REFLEX MICROSCOPIC
Bilirubin Urine: NEGATIVE
Glucose, UA: 50 mg/dL — AB
Hgb urine dipstick: NEGATIVE
Ketones, ur: NEGATIVE mg/dL
Leukocytes,Ua: NEGATIVE
Nitrite: NEGATIVE
Protein, ur: NEGATIVE mg/dL
Specific Gravity, Urine: 1.008 (ref 1.005–1.030)
pH: 7 (ref 5.0–8.0)

## 2021-02-21 LAB — PROCALCITONIN: Procalcitonin: 0.22 ng/mL

## 2021-02-21 MED ORDER — SACUBITRIL-VALSARTAN 24-26 MG PO TABS: 1.0000 | ORAL_TABLET | Freq: Two times a day (BID) | ORAL | 1 refills | Status: DC

## 2021-02-21 MED ORDER — ASPIRIN 81 MG PO CHEW: 81.0000 mg | CHEWABLE_TABLET | Freq: Every day | ORAL | 0 refills | Status: AC

## 2021-02-21 MED ORDER — TICAGRELOR 90 MG PO TABS
90.0000 mg | ORAL_TABLET | Freq: Two times a day (BID) | ORAL | 1 refills | Status: DC
  Filled 2021-02-21: qty 60, 30d supply, fill #0

## 2021-02-21 MED ORDER — METOPROLOL SUCCINATE ER 50 MG PO TB24: 50.0000 mg | ORAL_TABLET | Freq: Two times a day (BID) | ORAL | Status: DC

## 2021-02-21 MED ORDER — ATORVASTATIN CALCIUM 80 MG PO TABS: 80.0000 mg | ORAL_TABLET | Freq: Every day | ORAL | 1 refills | Status: DC

## 2021-02-21 MED ORDER — METOPROLOL SUCCINATE ER 50 MG PO TB24
50.0000 mg | ORAL_TABLET | Freq: Every day | ORAL | Status: DC
  Administered 2021-02-21: 50 mg via ORAL
  Filled 2021-02-21: qty 1

## 2021-02-21 NOTE — Progress Notes (Signed)
0935-1000 Reinforced with pt the importance of watching sodium, taking brilinta , keeping diabetes well controlled, and attending CRP 2 (once she has healed from ankle). Asked if any questions from husband and pt re ed done yesterday by coworker. Pt stated she will read materials and work on sodium. They raise their own chickens and have their own cow for meats and raise their own vegetables. Voiced understanding of ed. Luetta Nutting RN BSN 02/21/2021 10:01 AM

## 2021-02-21 NOTE — TOC Initial Note (Signed)
Transition of Care [facility]) - Initial/Assessment Note    Patient Details  Name: Amanda Frederick MRN: [id number] Date of Birth: [birth year]  Transition of Care [facility]) CM/SW Contact:    Gala Lewandowsky, RN Phone Number: 02/21/2021, 10:09 AM  Clinical Narrative:  Case Manager spoke with patient regarding home health needs. Prior to arrival patient was from home with family support. Patient is agreeable to home health services. Patient does not have a preference regarding the home health agency. Case Manager called [facility] and they are not in network with [facility]. Case Manager then called [facility] and the agency is in network with [facility] and can service the patient's zip code area. Patient is agreeable to [facility] providing the services and start of care to begin within 24-48 hours post transition home. Patient has durable medical equipment bedside commode at home and uses this in the shower. Patient declines a shower chair at this time. No further needs identified by Case Manager.                Expected Discharge Plan: Home w Home Health Services Barriers to Discharge: No Barriers Identified   Patient Goals and CMS Choice Patient states their goals for this hospitalization and ongoing recovery are:: to return home   Choice offered to / list presented to : NA (Patient did not have a preference regarding agency)  Expected Discharge Plan and Services Expected Discharge Plan: Home w Home Health Services In-house Referral: NA Discharge Planning Services: CM Consult Post Acute Care Choice: Home Health Living arrangements for the past 2 months: Single Family Home                 DME Arranged: N/A DME Agency: NA       HH Arranged: PT HH Agency: [facility] Date HH Agency Contacted: 02/21/21 Time HH Agency Contacted: 1009 Representative spoke with at [facility] Agency: Stacie  Prior Living Arrangements/Services Living arrangements for the past 2 months: Single  Family Home Lives with:: Spouse Patient language and need for interpreter reviewed:: Yes Do you feel safe going back to the place where you live?: Yes      Need for Family Participation in Patient Care: Yes (Comment) Care giver support system in place?: Yes (comment) Current home services: DME (Pt has a bedside commode) Criminal Activity/Legal Involvement Pertinent to Current Situation/Hospitalization: No - Comment as needed  Activities of Daily Living Home Assistive Devices/Equipment: Insulin Pump, Wheelchair ADL Screening (condition at time of admission) Patient's cognitive ability adequate to safely complete daily activities?: Yes Is the patient deaf or have difficulty hearing?: No Does the patient have difficulty seeing, even when wearing glasses/contacts?: Yes Does the patient have difficulty concentrating, remembering, or making decisions?: Yes Patient able to express need for assistance with ADLs?: Yes Does the patient have difficulty dressing or bathing?: Yes (recent ankle surgery) Independently performs ADLs?: No Communication: Independent Dressing (OT): Needs assistance Is this a change from baseline?: Pre-admission baseline (since recent ankle surgery) Grooming: Needs assistance Is this a change from baseline?: Pre-admission baseline (since recent ankle surgery) Feeding: Independent Bathing: Needs assistance Is this a change from baseline?: Pre-admission baseline (since recent ankle surgery) Toileting: Needs assistance Is this a change from baseline?: Pre-admission baseline (since recent ankle surgery) In/Out Bed: Needs assistance Is this a change from baseline?: Pre-admission baseline (since recent ankle surgery) Walks in Home: Needs assistance Is this a change from baseline?: Pre-admission baseline (since recent ankle surgery) Does the patient have difficulty walking or climbing  stairs?: Yes Weakness of Legs: Both Weakness of Arms/Hands: None  Permission  Sought/Granted Permission sought to share information with : Family Supports, Magazine features editor, Case Estate manager/land agent granted to share information with : Yes, Verbal Permission Granted     Permission granted to share info w AGENCY: [facility]     Admission diagnosis:  Acute respiratory distress [R06.03] Accelerated hypertension [I10] Acute systolic CHF (congestive heart failure) (HCC) [I50.21] Congestive heart failure, unspecified HF chronicity, unspecified heart failure type (HCC) [I50.9] Patient Active Problem List   Diagnosis Date Noted   Accelerated hypertension 02/15/2021   Tachycardia 02/15/2021   Acute systolic CHF (congestive heart failure) (HCC) 02/15/2021   Ankle fracture, bimalleolar, closed, left, initial encounter 01/04/2021   Displaced bimalleolar fracture of left lower leg, initial encounter for closed fracture 01/02/2021   Diabetes mellitus type 2, uncontrolled, with complications (HCC) 03/03/2020   Diabetic neuropathy (HCC) 03/03/2020   Anxiety 03/03/2020   HLD (hyperlipidemia) 03/03/2020   Acute respiratory failure with hypoxia (HCC) 03/03/2020   COVID-19 virus infection 03/02/2020   Neck pain 04/22/2013   Diabetes (HCC)    High cholesterol    Depression    Inflammatory and toxic neuropathy, 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 [place] [address] Phone: [phone] Fax: [phone]  [facility] [address] [place] [address] Phone: [phone] Fax:  [phone]

## 2021-02-21 NOTE — Discharge Summary (Signed)
Physician Discharge Summary  Amanda Frederick ZOX:[id number] DOB: 1971-03-23 DOA: 02/14/2021  PCP: Paulina Fusi, MD  Admit date: 02/14/2021 Discharge date: 02/21/2021  Admitted From: home Disposition:  home  Recommendations for Outpatient Follow-up:  Follow up with PCP and cardiology in 1 wk Home Health:no  Equipment/Devices: none  Discharge Condition: Stable Code Status:   Code Status: Full Code Diet recommendation:  Diet Order             Diet Heart Room service appropriate? Yes; Fluid consistency: Thin  Diet effective now           Diet - low sodium heart healthy           Diet Carb Modified                    Brief/Interim Summary: 50 year old female with fibromyalgia diabetes mellitus on insulin pump, HTN, HLD, diabetic neuropathy status post bimalleolar ankle fracture repair on 6/10 by Dr. Magnus Ivan presents with bilateral lower extremity swelling, worsening shortness of breath x2 weeks and also abdominal girth increase, weight gain and inability to lay flat, occasional chest pain worse with exertion.  She was seen in the ED and admitted for acute systolic CHF, moderate to large pleural effusion.  Underwent left thoracentesis 700 cc of fluid removed by IR 7/24, seen by cardiology and being diuresed Underwent cardiac cath with codominant coronary arteries with diffuse 50% stenosis in the LAD, OM1, OM 2 and D2 and 90% M LCx status post PCI. Due to persistent symptoms patient underwent CT angio chest 7/27 no PE. Seen by cardio this am and patient is wanting to go home. She had episode of dizziness and BP in 80s after metoprolol. But subsided bP now in 97, no mreo dizzy, negative orthostatic. Patient very upset abt staying one more night.  Offered her to monitor additional night but at this time she is adamant on going home.  Cardio Dr. Tenny Craw is okay with discharge.  Patient does not want to take metoprolol so discontinued.  [facility] Brilinta  rest  of her medswere sent to her [facility] in [place].  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 WJX:BJYN 25 to 30% restrictive diastolic filling pattern G3 DD severe left ventricular dysfunction.  As per cardiology patient will continue on Aldactone,Entresto 24-26.  Underwent CT angio chest no PE but mild to moderate patchy groundglass opacities throughout the periphery of both lungs-differential pulmonary edema atypical pneumonia/postinflammatory fibrosis-suspect it is secondary to CHF.  Checked procalcitonin level- and stable, 0.22 no e/o infection.  Patient did not want to start metoprolol at this time and will follow up with cardiology Net IO Since Admission: -12,573.69 mL [02/21/21 1652] .  Filed Weights   02/20/21 0432 02/20/21 0452 02/21/21 0404  Weight: 82 kg 80.4 kg 81.5 kg   Orthostatic dizziness/hypotension this afternoon: Afternoon dose of metoprolol, discontinued per patient request did not want to try low-dose will be followed up by outpatient cardiology.  Discussed with Dr. Tenny Craw.  Patient adamant on going home.   Sinus tachycardia TSH normal monitor. UA done for sp gravity- 1.008, orthostatic vitals  sitting to standing: 92/64>99/71>105/80.  Heart rate has improved to 90s this afternoon.  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  Bilateral pleural effusion left greater than right status post thoracentesis   DM: Well-controlled on insulin pump.patient is on regular diet per request.  HBA1c stable 6.8 . Recent Labs  Lab 02/21/21 0005 02/21/21 0400 02/21/21 0739 02/21/21 1131 02/21/21 1544  GLUCAP 175* 199* 139* 252* 174*  Essential hypertension: BP is well controlled-CHF.  ?OSA, needing 2l [place].  Will benefit  with outpatient sleep apnea evaluations once acute issue resolved  Fibromyalgia  Recent left ankle surgery postrepair PT OT, fall precaution pain control. Seen by PT OT advised outpatient follow up  Consultants:see note  Procedures:  Cardiac cath:02/19/2021   1st Mrg lesion is 50% stenosed.   2nd Mrg lesion is 50% stenosed.   2nd Diag lesion is 50% stenosed.   Mid LAD to Dist LAD lesion is 50% stenosed.   Mid Cx lesion is 90% stenosed.   A drug-eluting stent was successfully placed using a STENT ONYX FRONTIER 2.75X22, postdilated to 3.25 mm.   Post intervention, there is a 0% residual stenosis.   The left ventricular systolic function is normal.   LV end diastolic pressure is normal.   The left ventricular ejection fraction is 25-35% by visual estimate.   There is no aortic valve stenosis.   Ao sat 100%, PA sat 75%, PA pressure 34/15, mean PA pressure 23 mm Hg; mean PCWP 9 mm Hg; CO 5.02 L/min; CI 2.54   Continue aggressive medical therapy for her LV dysfunction.  Cardiomyopathy may have aspects of both ischemic and nonischemic.  Diffuse, small vessel disease noted in the diagonal, distal LAD and obtuse marginal vessels.   After 12 months of dual antiplatelet therapy with Brilinta, consider clopidogrel monotherapy given her diffuse CAD.  CT angio chest 1. No pulmonary embolism. 2. Small dependent bilateral pleural effusions. Top-normal heart size. 3. Mild to moderate patchy ground-glass opacities throughout the periphery of both lungs with associated mild parenchymal distortion and curvilinear parenchymal bands, most prominent in the right middle lobe and perilobular lower lobes. Differential includes mild pulmonary edema, atypical pneumonia and/or postinflammatory fibrosis. 4. Right lower lobe 5 mm solid pulmonary nodule. No follow-up needed if patient is low-risk. Non-contrast chest CT can be considered in 12 months if patient is high-risk. This recommendation follows  the    Consults: Cardiology  Subjective: Alert awake, upset about staying 1 more night and wants to go home today.  Normotensive.  Heart rate 90s. Discharge Exam: Vitals:   02/21/21 1330 02/21/21 1421  BP: (!) 81/64 98/67  Pulse: 91 87  Resp: 16 16  Temp: 98 F (36.7 C)   SpO2: 97% 98%   General: Pt is alert, awake, not in acute distress Cardiovascular: RRR, S1/S2 +, no rubs, no gallops Respiratory: CTA bilaterally, no wheezing, no rhonchi Abdominal: Soft, NT, ND, bowel sounds + Extremities: no edema, no cyanosis  Discharge Instructions  Discharge Instructions     (HEART FAILURE PATIENTS) Call MD:  Anytime you have any of the following symptoms: 1) 3 pound weight gain in 24 hours or 5 pounds in 1 week 2) shortness of breath, with or without a dry hacking cough 3) swelling in the hands, feet or stomach 4) if you have to sleep on extra pillows at night in order to breathe.   Complete by: As directed    Amb Referral to Cardiac Rehabilitation   Complete by: As directed    To [facility]   Diagnosis:  Coronary Stents PTCA     After initial evaluation and assessments completed: Virtual  Based Care may be provided alone or in conjunction with Phase 2 Cardiac Rehab based on patient barriers.: Yes   Diet - low sodium heart healthy   Complete by: As directed    Diet Carb Modified   Complete by: As directed    Discharge instructions   Complete by: As directed    You will need to follow-up with the cardiology team please call the office  Please call call MD or return to ER for similar or worsening recurring problem that brought you to hospital or if any fever,nausea/vomiting,abdominal pain, uncontrolled pain, chest pain,  shortness of breath or any other alarming symptoms.  Please follow-up your doctor as instructed in a week time and call the office for appointment.  Please avoid alcohol, smoking, or any other illicit substance and maintain healthy habits including taking your  regular medications as prescribed.  You were cared for by a hospitalist during your hospital stay. If you have any questions about your discharge medications or the care you received while you were in the hospital after you are discharged, you can call the unit and ask to speak with the hospitalist on call if the hospitalist that took care of you is not available.  Once you are discharged, your primary care physician will handle any further medical issues. Please note that NO REFILLS for any discharge medications will be authorized once you are discharged, as it is imperative that you return to your primary care physician (or establish a relationship with a primary care physician if you do not have one) for your aftercare needs so that they can reassess your need for medications and monitor your lab values   Increase activity slowly   Complete by: As directed    No wound care   Complete by: As directed       Allergies as of 02/21/2021       Reactions   Fentanyl Nausea And Vomiting   Other Hives   Antibiotic specific one unsure but has tolerated antibiotic recently. Reaction was about 20 years ago.        Medication List     STOP taking these medications    albuterol 108 (90 Base) MCG/ACT inhaler Commonly known as: VENTOLIN HFA   HYDROcodone-acetaminophen 5-325 MG tablet Commonly known as: NORCO/VICODIN   lisinopril 20 MG tablet Commonly known as: ZESTRIL       TAKE these medications    aspirin 81 MG chewable tablet Chew 1 tablet (81 mg total) by mouth daily. Start taking on: February 22, 2021   atorvastatin 80 MG tablet Commonly known as: LIPITOR Take 1 tablet (80 mg total) by mouth daily. Start taking on: February 22, 2021   cholecalciferol 1000 units tablet Commonly known as: VITAMIN D Take 1,000 Units by mouth daily.   fish oil-omega-3 fatty acids 1000 MG capsule Take 2 g by mouth 2 (two) times daily.   glucose blood test strip Commonly known as: Banker  Next Test Test 8-10 times per day 8-10 lancets/day   insulin aspart 100 UNIT/ML injection Commonly known as: NovoLOG For use in pump, total of 120 units per day   Magnesium 250 MG Tabs Take 250 mg by mouth daily.   multivitamin capsule Take 1 capsule by mouth daily.   oxyCODONE 5 MG immediate release tablet Commonly known as: Oxy IR/ROXICODONE Take 1 tablet (5 mg total) by mouth every 6 (six) hours as needed for moderate pain (pain score 4-6).   pantoprazole 40 MG tablet Commonly  known as: Protonix Take 1 tablet (40 mg total) by mouth daily for 30 doses.   Paradigm Quick-set 32" 6mm Misc 1 Device by Does not apply route continuous. Vovolog   sacubitril-valsartan 24-26 MG Commonly known as: ENTRESTO Take 1 tablet by mouth 2 (two) times daily.   ticagrelor 90 MG Tabs tablet Commonly known as: BRILINTA Take 1 tablet (90 mg total) by mouth 2 (two) times daily.   Trelegy Ellipta 200-62.5-25 MCG/INH Aepb Generic drug: Fluticasone-Umeclidin-Vilant Inhale 1 puff into the lungs daily.       ASK your doctor about these medications    gabapentin 100 MG capsule Commonly known as: NEURONTIN Take 1 capsule (100 mg total) by mouth 3 (three) times daily.   tiZANidine 4 MG tablet Commonly known as: Zanaflex Take 1 tablet (4 mg total) by mouth every 8 (eight) hours as needed for muscle spasms.        Follow-up Information     [facility] Follow up.   Specialty: Home Health Services Why: Home Health Physical Therapy-office to call with visit times. Contact information: [address] [place] [address] [phone]          [facility] Follow up on 02/26/2021.   Specialty: Cardiology Why: Please arrive 15 minutes early for your 1:00pm post-hospital cardiology appointment [facility]. You can park in the parking deck can be accessed off [address] at entrance C. The parking code is  [id number]. Contact information: [address] [id number] mc [address] [place] [address] [phone]        Paulina Fusi, MD Follow up in 1 week(s).   Specialty: Internal Medicine Contact information: [address] [place] [address] [phone]         Parke Poisson, MD .   Specialties: Cardiology, Radiology Contact information: [address] [place] [address] [place] [address] [phone]                Allergies  Allergen Reactions   Fentanyl Nausea And Vomiting   Other Hives    Antibiotic specific one unsure but has tolerated antibiotic recently. Reaction was about 20 years ago.    The results of significant diagnostics from this hospitalization (including imaging, microbiology, ancillary and laboratory) are listed below for reference.    Microbiology: Recent Results (from the past 240 hour(s))  Resp Panel by RT-PCR (Flu A&B, Covid) Nasopharyngeal Swab     Status: None   Collection Time: 02/14/21 11:12 PM   Specimen: Nasopharyngeal Swab; Nasopharyngeal(NP) swabs in vial transport medium  Result Value Ref Range Status   SARS Coronavirus 2 by RT PCR NEGATIVE NEGATIVE Final    Comment: (NOTE) SARS-CoV-2 target nucleic acids are NOT DETECTED.  The SARS-CoV-2 RNA is generally detectable in upper respiratory specimens during the acute phase of infection. The lowest concentration of SARS-CoV-2 viral copies this assay can detect is 138 copies/mL. A negative result does not preclude SARS-Cov-2 infection and should not be used as the sole basis for treatment or other patient management decisions. A negative result may occur with  improper specimen collection/handling, submission of specimen other than nasopharyngeal swab, presence of viral mutation(s) within the areas targeted by this assay, and inadequate number of viral copies(<138 copies/mL). A negative result must be combined with clinical observations, patient  history, and epidemiological information. The expected result is Negative.  Fact Sheet for Patients:  BloggerCourse.com  Fact Sheet for Healthcare  Providers:  SeriousBroker.it  This test is no t yet approved or cleared by the [place] and  has been authorized for detection and/or diagnosis of SARS-CoV-2 by FDA under an Emergency Use Authorization (EUA). This EUA will remain  in effect (meaning this test can be used) for the duration of the COVID-19 declaration under Section 564(b)(1) of the Act, 21 U.S.C.section 360bbb-3(b)(1), unless the authorization is terminated  or revoked sooner.       Influenza A by PCR NEGATIVE NEGATIVE Final   Influenza B by PCR NEGATIVE NEGATIVE Final    Comment: (NOTE) The Xpert Xpress SARS-CoV-2/FLU/RSV plus assay is intended as an aid in the diagnosis of influenza from Nasopharyngeal swab specimens and should not be used as a sole basis for treatment. Nasal washings and aspirates are unacceptable for Xpert Xpress SARS-CoV-2/FLU/RSV testing.  Fact Sheet for Patients: BloggerCourse.com  Fact Sheet for Healthcare Providers: SeriousBroker.it  This test is not yet approved or cleared by the [place] FDA and has been authorized for detection and/or diagnosis of SARS-CoV-2 by FDA under an Emergency Use Authorization (EUA). This EUA will remain in effect (meaning this test can be used) for the duration of the COVID-19 declaration under Section 564(b)(1) of the Act, 21 U.S.C. section 360bbb-3(b)(1), unless the authorization is terminated or revoked.  Performed at [facility], [address], [place], [place] [address]   Gram stain     Status: None   Collection Time: 02/17/21 12:37 PM   Specimen: Pleura  Result Value Ref Range Status   Specimen Description PLEURAL FLUID  Final   Special Requests NONE  Final   Gram Stain   Final     WBC PRESENT,BOTH PMN AND MONONUCLEAR NO ORGANISMS SEEN CYTOSPIN SMEAR Performed at [facility], [address], [place], [place] [address]    Report Status 02/17/2021 FINAL  Final  Culture, body fluid w Gram Stain-bottle     Status: None (Preliminary result)   Collection Time: 02/17/21 12:37 PM   Specimen: Pleura  Result Value Ref Range Status   Specimen Description PLEURAL FLUID  Final   Special Requests NONE  Final   Culture   Final    NO GROWTH 4 DAYS Performed at [facility], [address], [place], [place] [address]    Report Status PENDING  Incomplete    Procedures/Studies: DG Chest 1 View  Result Date: 02/17/2021 CLINICAL DATA:  Status post thoracentesis EXAM: CHEST  1 VIEW COMPARISON:  02/14/2021 chest radiograph. FINDINGS: Stable cardiomediastinal silhouette with normal heart size. No pneumothorax. Small left pleural effusion, decreased. No significant right pleural effusion. No pulmonary edema. Improved left lung base aeration with decreased left lung base patchy opacity. Similar mild patchy and curvilinear opacities at the peripheral right lung base. IMPRESSION: 1. No pneumothorax. Small left pleural effusion, decreased. 2. Improved left lung base aeration with decreased left lung base patchy opacity, favor atelectasis. 3. Stable mild patchy and curvilinear opacities at the peripheral right lung base. Follow-up chest radiographs advised. Electronically Signed   By: Delbert Phenix M.D.   On: 02/17/2021 13:14   DG Chest 2 View  Result Date: 02/19/2021 CLINICAL DATA:  CHF.  Intermittent shortness of breath. EXAM: CHEST - 2 VIEW COMPARISON:  02/17/2021 and 03/02/2020 FINDINGS: Patchy densities in the right lower chest are similar to the previous examination. Suspect some of these densities are chronic based on the older comparison examination. Heart size is normal without overt pulmonary edema. Aeration  at the left lung base has improved although there may still be  very small pleural effusions based on the lateral view. Trachea is midline. No acute bone abnormality. IMPRESSION: 1. Improving aeration at the left lung base. 2. Probable very small pleural effusions. 3. No evidence for cardiomegaly or CHF. 4. Patchy densities in the right lower lung may represent a combination of acute on chronic changes. Findings are similar to the recent comparison examination. Electronically Signed   By: Richarda Overlie M.D.   On: 02/19/2021 11:46   DG Chest 2 View  Result Date: 02/14/2021 CLINICAL DATA:  Shortness of breath, bilateral lower extremity edema EXAM: CHEST - 2 VIEW COMPARISON:  03/02/2020 FINDINGS: The heart is top normal in size. Pulmonary vascular congestion and suspected mild interstitial edema. Moderate left and small right pleural effusions. Associated lower lobe opacities, likely atelectasis. No pneumothorax. IMPRESSION: Suspected mild interstitial edema. Moderate left and small right pleural effusions. Associated lower lobe opacities, likely atelectasis. Electronically Signed   By: Charline Bills M.D.   On: 02/14/2021 19:41   CT Angio Chest Pulmonary Embolism (PE) W or WO Contrast  Result Date: 02/20/2021 CLINICAL DATA:  Inpatient. Left lower extremity swelling proved increased dyspnea. PE suspected, low-intermediate probability, positive D-dimer. EXAM: CT ANGIOGRAPHY CHEST WITH CONTRAST TECHNIQUE: Multidetector CT imaging of the chest was performed using the standard protocol during bolus administration of intravenous contrast. Multiplanar CT image reconstructions and MIPs were obtained to evaluate the vascular anatomy. CONTRAST:  80mL OMNIPAQUE IOHEXOL 350 MG/ML SOLN COMPARISON:  Chest radiograph from one day prior. FINDINGS: Cardiovascular: The study is moderate quality for the evaluation of pulmonary embolism, with some motion degradation. There are no filling defects in the central, lobar, segmental or subsegmental pulmonary artery branches to suggest acute  pulmonary embolism. Mildly atherosclerotic nonaneurysmal thoracic aorta. Normal caliber pulmonary arteries. Top normal heart size. No significant pericardial fluid/thickening. Mediastinum/Nodes: No discrete thyroid nodules. Unremarkable esophagus. No pathologically enlarged axillary, mediastinal or hilar lymph nodes. Lungs/Pleura: No pneumothorax. Small dependent bilateral pleural effusions. No acute consolidative airspace disease or lung masses. Peripheral right lower lobe solid 5 mm pulmonary nodule (series 7/image 53). No additional significant pulmonary nodules. Mild to moderate patchy ground-glass opacities throughout the periphery of both lungs with scattered mildly thickened curvilinear parenchymal bands with associated mild parenchymal distortion, most prominent in the right middle lobe and perilobular lower lobes. Upper abdomen: No acute abnormality. Musculoskeletal: No aggressive appearing focal osseous lesions. Mild thoracic spondylosis. Review of the MIP images confirms the above findings. IMPRESSION: 1. No pulmonary embolism. 2. Small dependent bilateral pleural effusions. Top-normal heart size. 3. Mild to moderate patchy ground-glass opacities throughout the periphery of both lungs with associated mild parenchymal distortion and curvilinear parenchymal bands, most prominent in the right middle lobe and perilobular lower lobes. Differential includes mild pulmonary edema, atypical pneumonia and/or postinflammatory fibrosis. 4. Right lower lobe 5 mm solid pulmonary nodule. No follow-up needed if patient is low-risk. Non-contrast chest CT can be considered in 12 months if patient is high-risk. This recommendation follows the consensus statement: Guidelines for Management of Incidental Pulmonary Nodules Detected on CT Images: From the Fleischner Society 2017; Radiology 2017; 284:228-243. 5. Aortic Atherosclerosis (ICD10-I70.0). Electronically Signed   By: Delbert Phenix M.D.   On: 02/20/2021 18:04   CARDIAC  CATHETERIZATION  Result Date: 02/19/2021 Formatting of this result is different from the original.   1st Mrg lesion is 50% stenosed.   2nd Mrg lesion is 50% stenosed.   2nd Diag lesion is  50% stenosed.   Mid LAD to Dist LAD lesion is 50% stenosed.   Mid Cx lesion is 90% stenosed.   A drug-eluting stent was successfully placed using a STENT ONYX FRONTIER 2.75X22, postdilated to 3.25 mm.   Post intervention, there is a 0% residual stenosis.   The left ventricular systolic function is normal.   LV end diastolic pressure is normal.   The left ventricular ejection fraction is 25-35% by visual estimate.   There is no aortic valve stenosis.   Ao sat 100%, PA sat 75%, PA pressure 34/15, mean PA pressure 23 mm Hg; mean PCWP 9 mm Hg; CO 5.02 L/min; CI 2.54 Continue aggressive medical therapy for her LV dysfunction.  Cardiomyopathy may have aspects of both ischemic and nonischemic.  Diffuse, small vessel disease noted in the diagonal, distal LAD and obtuse marginal vessels. After 12 months of dual antiplatelet therapy with Brilinta, consider clopidogrel monotherapy given her diffuse CAD.   ECHOCARDIOGRAM COMPLETE  Result Date: 02/15/2021    ECHOCARDIOGRAM REPORT   Patient Name:   MISHAAL LANSDALE Date of Exam: 02/15/2021 Medical Rec #:  [id number]     Height:       66.0 in Accession #:    [id number]    Weight:       170.0 lb Date of Birth:  1971/01/01     BSA:          1.866 m Patient Age:    49 years      BP:           156/117 mmHg Patient Gender: F             HR:           106 bpm. Exam Location:  Inpatient Procedure: 2D Echo, Cardiac Doppler and Color Doppler         REPORT CONTAINS CRITICAL RESULT Reported to: Dr Jens Som on 02/15/2021 1:46:00 PM          Reading Cardiologist notified. Indications:    Acute Respiratory Distress R06.03  History:        Patient has no prior history of Echocardiogram examinations.                 Risk Factors:Hypertension, Diabetes and Dyslipidemia. Seizures.                 COVID-19.   Sonographer:    Elmarie Shiley Dance Referring Phys: [id number] ANASTASSIA DOUTOVA IMPRESSIONS  1. Global hypokinesis with apical akinesis; overall severe LV dysfunction.  2. Left ventricular ejection fraction, by estimation, is 25 to 30%. The left ventricle has severely decreased function. The left ventricle demonstrates regional wall motion abnormalities (see scoring diagram/findings for description). Left ventricular diastolic parameters are consistent with Grade III diastolic dysfunction (restrictive). Elevated left atrial pressure.  3. Right ventricular systolic function is normal. The right ventricular size is normal. There is mildly elevated pulmonary artery systolic pressure.  4. Large pleural effusion in the left lateral region.  5. The mitral valve is normal in structure. Mild mitral valve regurgitation. No evidence of mitral stenosis.  6. The aortic valve is tricuspid. Aortic valve regurgitation is not visualized. No aortic stenosis is present.  7. The inferior vena cava is dilated in size with >50% respiratory variability, suggesting right atrial pressure of 8 mmHg. FINDINGS  Left Ventricle: Left ventricular ejection fraction, by estimation, is 25 to 30%. The left ventricle has severely decreased function. The left ventricle demonstrates regional wall motion abnormalities. The left ventricular internal  cavity size was normal  in size. There is no left ventricular hypertrophy. Left ventricular diastolic parameters are consistent with Grade III diastolic dysfunction (restrictive). Elevated left atrial pressure. Right Ventricle: The right ventricular size is normal. Right ventricular systolic function is normal. There is mildly elevated pulmonary artery systolic pressure. The tricuspid regurgitant velocity is 2.74 m/s, and with an assumed right atrial pressure of 8 mmHg, the estimated right ventricular systolic pressure is 38.0 mmHg. Left Atrium: Left atrial size was normal in size. Right Atrium: Right atrial size was  normal in size. Pericardium: Trivial pericardial effusion is present. Mitral Valve: The mitral valve is normal in structure. Mild mitral valve regurgitation. No evidence of mitral valve stenosis. Tricuspid Valve: The tricuspid valve is normal in structure. Tricuspid valve regurgitation is mild . No evidence of tricuspid stenosis. Aortic Valve: The aortic valve is tricuspid. Aortic valve regurgitation is not visualized. No aortic stenosis is present. Pulmonic Valve: The pulmonic valve was normal in structure. Pulmonic valve regurgitation is trivial. No evidence of pulmonic stenosis. Aorta: The aortic root is normal in size and structure. Venous: The inferior vena cava is dilated in size with greater than 50% respiratory variability, suggesting right atrial pressure of 8 mmHg. IAS/Shunts: No atrial level shunt detected by color flow Doppler. Additional Comments: Global hypokinesis with apical akinesis; overall severe LV dysfunction. There is a large pleural effusion in the left lateral region.  LEFT VENTRICLE PLAX 2D LVIDd:         4.70 cm  Diastology LVIDs:         3.40 cm  LV e' medial:    6.31 cm/s LV PW:         1.10 cm  LV E/e' medial:  19.3 LV IVS:        0.80 cm  LV e' lateral:   7.40 cm/s LVOT diam:     1.90 cm  LV E/e' lateral: 16.5 LV SV:         32 LV SV Index:   17 LVOT Area:     2.84 cm  RIGHT VENTRICLE             IVC RV Basal diam:  3.30 cm     IVC diam: 2.30 cm RV Mid diam:    1.80 cm RV S prime:     11.20 cm/s TAPSE (M-mode): 1.6 cm LEFT ATRIUM           Index       RIGHT ATRIUM           Index LA diam:      4.40 cm 2.36 cm/m  RA Area:     15.80 cm LA Vol (A2C): 52.3 ml 28.02 ml/m RA Volume:   44.00 ml  23.58 ml/m LA Vol (A4C): 36.0 ml 19.29 ml/m  AORTIC VALVE LVOT Vmax:   67.50 cm/s LVOT Vmean:  49.300 cm/s LVOT VTI:    0.112 m  AORTA Ao Root diam: 2.70 cm Ao Asc diam:  2.80 cm MITRAL VALVE                TRICUSPID VALVE MV Area (PHT): 5.54 cm     TR Peak grad:   30.0 mmHg MV Decel Time: 137  msec     TR Vmax:        274.00 cm/s MV E velocity: 122.00 cm/s MV A velocity: 39.20 cm/s   SHUNTS MV E/A ratio:  3.11         Systemic VTI:  0.11 m                             Systemic Diam: 1.90 cm Olga Millers MD Electronically signed by Olga Millers MD Signature Date/Time: 02/15/2021/2:34:54 PM    Final    XR Ankle Complete Left  Result Date: 02/14/2021 3 views left ankle show a healing bimalleolar ankle fracture status post open reduction/internal fixation.  The mortise is congruent.  VAS [place] LOWER EXTREMITY VENOUS (DVT) (ONLY [facility] & [facility])  Result Date: 02/15/2021  Lower Venous DVT Study Patient Name:  NANCE MCCOMBS  Date of Exam:   02/15/2021 Medical Rec #: [id number]      Accession #:    [id number] Date of Birth: [birth year]      Patient Gender: F Patient Age:   49Y Exam Location:  [facility] Procedure:      VAS [place] LOWER EXTREMITY VENOUS (DVT) Referring Phys: [id number] ANASTASSIA DOUTOVA --------------------------------------------------------------------------------  Indications: Edema. Other Indications: LLE bimalleolar fracture 6 weeks post surgery. Comparison Study: No previous exams Performing Technologist: Jody Hill RVT, RDMS  Examination Guidelines: A complete evaluation includes B-mode imaging, spectral Doppler, color Doppler, and power Doppler as needed of all accessible portions of each vessel. Bilateral testing is considered an integral part of a complete examination. Limited examinations for reoccurring indications may be performed as noted. The reflux portion of the exam is performed with the patient in reverse Trendelenburg.  +---------+---------------+---------+-----------+----------+--------------+ RIGHT    CompressibilityPhasicitySpontaneityPropertiesThrombus Aging +---------+---------------+---------+-----------+----------+--------------+ CFV      Full           Yes      Yes                                  +---------+---------------+---------+-----------+----------+--------------+ SFJ      Full                                                        +---------+---------------+---------+-----------+----------+--------------+ FV Prox  Full           Yes      Yes                                 +---------+---------------+---------+-----------+----------+--------------+ FV Mid   Full           Yes      Yes                                 +---------+---------------+---------+-----------+----------+--------------+ FV DistalFull           Yes      Yes                                 +---------+---------------+---------+-----------+----------+--------------+ PFV      Full                                                        +---------+---------------+---------+-----------+----------+--------------+  POP      Full           Yes      Yes                                 +---------+---------------+---------+-----------+----------+--------------+ PTV      Full                                                        +---------+---------------+---------+-----------+----------+--------------+ PERO     Full                                                        +---------+---------------+---------+-----------+----------+--------------+   +---------+---------------+---------+-----------+----------+--------------+ LEFT     CompressibilityPhasicitySpontaneityPropertiesThrombus Aging +---------+---------------+---------+-----------+----------+--------------+ CFV      Full           Yes      Yes                                 +---------+---------------+---------+-----------+----------+--------------+ SFJ      Full                                                        +---------+---------------+---------+-----------+----------+--------------+ FV Prox  Full           Yes      Yes                                  +---------+---------------+---------+-----------+----------+--------------+ FV Mid   Full           Yes      Yes                                 +---------+---------------+---------+-----------+----------+--------------+ FV DistalFull           Yes      Yes                                 +---------+---------------+---------+-----------+----------+--------------+ PFV      Full                                                        +---------+---------------+---------+-----------+----------+--------------+ POP      Full           Yes      Yes                                 +---------+---------------+---------+-----------+----------+--------------+ PTV  Full                                                        +---------+---------------+---------+-----------+----------+--------------+ PERO     Full                                                        +---------+---------------+---------+-----------+----------+--------------+     Summary: BILATERAL: - No evidence of deep vein thrombosis seen in the lower extremities, bilaterally. - No evidence of superficial venous thrombosis in the lower extremities, bilaterally. -No evidence of popliteal cyst, bilaterally.   *See table(s) above for measurements and observations. Electronically signed by Heath Lark on 02/15/2021 at 4:40:24 PM.    Final    US THORACENTESIS ASP PLEURAL SPACE W/IMG GUIDE  Result Date: 02/17/2021 INDICATION: Bilateral pleural effusion, left greater than right. Request for therapeutic and diagnostic thoracentesis. EXAM: ULTRASOUND GUIDED LEFT THORACENTESIS MEDICATIONS: 10 mL 1% lidocaine COMPLICATIONS: None immediate. PROCEDURE: An ultrasound guided thoracentesis was thoroughly discussed with the patient and questions answered. The benefits, risks, alternatives and complications were also discussed. The patient understands and wishes to proceed with the procedure. Written consent was obtained.  Ultrasound was performed to localize and mark an adequate pocket of fluid in the left chest. The area was then prepped and draped in the normal sterile fashion. 1% Lidocaine was used for local anesthesia. Under ultrasound guidance a 6 Fr Safe-T-Centesis catheter was introduced. Thoracentesis was performed. The catheter was removed and a dressing applied. FINDINGS: A total of approximately 700 cc of clear yellow fluid was removed. Samples were sent to the laboratory as requested by the clinical team. Post procedure chest X-ray reviewed, negative for pneumothorax. IMPRESSION: Successful ultrasound guided left thoracentesis yielding 700 cc of pleural fluid. Read by: Lawernce Ion, PA-C Electronically Signed   By: Irish Lack M.D.   On: 02/17/2021 13:33    Labs: BNP (last 3 results) Recent Labs    02/14/21 2121 02/19/21 0834  BNP 624.2* 259.9*   Basic Metabolic Panel: Recent Labs  Lab 02/15/21 0248 02/15/21 0409 02/16/21 1225 02/17/21 0230 02/18/21 0841 02/19/21 0023 02/19/21 0221 02/19/21 1214 02/19/21 1230 02/19/21 1231 02/20/21 0102 02/21/21 0454  NA  --    < > 138 138 139 140  --  141 140 140 137 136  K  --    < > 3.7 3.8 4.1 4.1  --  4.0 4.2 4.2 4.2 4.0  CL  --    < > 101 103 102 103  --   --   --   --  104 106  CO2  --    < > 29 28 27 28   --   --   --   --  26 22  GLUCOSE  --    < > 102* 109* 115* 110*  --   --   --   --  160* 179*  BUN  --    < > 14 17 12 17   --   --   --   --  20 15  CREATININE  --    < > 0.82 0.77 0.89 0.88  --   --   --   --  0.88 0.68  CALCIUM  --    < > 8.8* 8.8* 9.2 8.9  --   --   --   --  8.8* 8.9  MG 2.0  --  1.8  --  2.0  --  2.0  --   --   --   --   --   PHOS 4.6  --  4.2  --  4.8*  --  4.3  --   --   --   --   --    < > = values in this interval not displayed.   Liver Function Tests: Recent Labs  Lab 02/15/21 0248 02/15/21 0409 02/17/21 1258  AST 30 26  --   ALT 36 34  --   ALKPHOS 199* 183*  --   BILITOT 1.1 1.0  --   PROT 6.1* 5.7*  --    ALBUMIN 3.3* 3.2* 3.0*   No results for input(s): LIPASE, AMYLASE in the last 168 hours. No results for input(s): AMMONIA in the last 168 hours. CBC: Recent Labs  Lab 02/14/21 1859 02/15/21 0249 02/15/21 0409 02/16/21 1225 02/18/21 0841 02/19/21 1214 02/19/21 1230 02/19/21 1231 02/20/21 0102 02/21/21 0454  WBC 9.3   < > 7.4 8.4 6.9  --   --   --  6.1 6.7  NEUTROABS 7.1  --  5.5  --   --   --   --   --   --   --   HGB 15.1*   < > 14.3 14.5 15.4* 17.0* 17.3* 17.0* 14.9 15.0  HCT 48.3*   < > 45.2 45.6 48.7* 50.0* 51.0* 50.0* 46.8* 47.6*  MCV 86.3   < > 85.9 84.0 83.5  --   --   --  84.0 84.1  PLT 296   < > 260 263 252  --   --   --  232 223   < > = values in this interval not displayed.   Cardiac Enzymes: Recent Labs  Lab 02/15/21 0248  CKTOTAL 337*   BNP: Invalid input(s): POCBNP CBG: Recent Labs  Lab 02/21/21 0005 02/21/21 0400 02/21/21 0739 02/21/21 1131 02/21/21 1544  GLUCAP 175* 199* 139* 252* 174*   D-Dimer No results for input(s): DDIMER in the last 72 hours. Hgb A1c No results for input(s): HGBA1C in the last 72 hours. Lipid Profile No results for input(s): CHOL, HDL, LDLCALC, TRIG, CHOLHDL, LDLDIRECT in the last 72 hours. Thyroid function studies Recent Labs    02/19/21 0221  TSH 2.108   Anemia work up No results for input(s): VITAMINB12, FOLATE, FERRITIN, TIBC, IRON, RETICCTPCT in the last 72 hours. Urinalysis    Component Value Date/Time   COLORURINE STRAW (A) 02/21/2021 0946   APPEARANCEUR CLEAR 02/21/2021 0946   LABSPEC 1.008 02/21/2021 0946   PHURINE 7.0 02/21/2021 0946   GLUCOSEU 50 (A) 02/21/2021 0946   HGBUR NEGATIVE 02/21/2021 0946   BILIRUBINUR NEGATIVE 02/21/2021 0946   KETONESUR NEGATIVE 02/21/2021 0946   PROTEINUR NEGATIVE 02/21/2021 0946   NITRITE NEGATIVE 02/21/2021 0946   LEUKOCYTESUR NEGATIVE 02/21/2021 0946   Sepsis Labs Invalid input(s): PROCALCITONIN,  WBC,  LACTICIDVEN Microbiology Recent Results (from the past  240 hour(s))  Resp Panel by RT-PCR (Flu A&B, Covid) Nasopharyngeal Swab     Status: None   Collection Time: 02/14/21 11:12 PM   Specimen: Nasopharyngeal Swab; Nasopharyngeal(NP) swabs in vial transport medium  Result Value Ref Range Status   SARS Coronavirus 2 by RT PCR NEGATIVE NEGATIVE Final  Comment: (NOTE) SARS-CoV-2 target nucleic acids are NOT DETECTED.  The SARS-CoV-2 RNA is generally detectable in upper respiratory specimens during the acute phase of infection. The lowest concentration of SARS-CoV-2 viral copies this assay can detect is 138 copies/mL. A negative result does not preclude SARS-Cov-2 infection and should not be used as the sole basis for treatment or other patient management decisions. A negative result may occur with  improper specimen collection/handling, submission of specimen other than nasopharyngeal swab, presence of viral mutation(s) within the areas targeted by this assay, and inadequate number of viral copies(<138 copies/mL). A negative result must be combined with clinical observations, patient history, and epidemiological information. The expected result is Negative.  Fact Sheet for Patients:  BloggerCourse.comhttps://www.fda.gov/media/152166/download  Fact Sheet for Healthcare Providers:  SeriousBroker.ithttps://www.fda.gov/media/152162/download  This test is no t yet approved or cleared by the [place] FDA and  has been authorized for detection and/or diagnosis of SARS-CoV-2 by FDA under an Emergency Use Authorization (EUA). This EUA will remain  in effect (meaning this test can be used) for the duration of the COVID-19 declaration under Section 564(b)(1) of the Act, 21 U.S.C.section 360bbb-3(b)(1), unless the authorization is terminated  or revoked sooner.       Influenza A by PCR NEGATIVE NEGATIVE Final   Influenza B by PCR NEGATIVE NEGATIVE Final    Comment: (NOTE) The Xpert Xpress SARS-CoV-2/FLU/RSV plus assay is intended as an aid in the diagnosis of influenza  from Nasopharyngeal swab specimens and should not be used as a sole basis for treatment. Nasal washings and aspirates are unacceptable for Xpert Xpress SARS-CoV-2/FLU/RSV testing.  Fact Sheet for Patients: BloggerCourse.comhttps://www.fda.gov/media/152166/download  Fact Sheet for Healthcare Providers: SeriousBroker.ithttps://www.fda.gov/media/152162/download  This test is not yet approved or cleared by the [place] FDA and has been authorized for detection and/or diagnosis of SARS-CoV-2 by FDA under an Emergency Use Authorization (EUA). This EUA will remain in effect (meaning this test can be used) for the duration of the COVID-19 declaration under Section 564(b)(1) of the Act, 21 U.S.C. section 360bbb-3(b)(1), unless the authorization is terminated or revoked.  Performed at [facility], [address] [place], [place] [address]   Gram stain     Status: None   Collection Time: 02/17/21 12:37 PM   Specimen: Pleura  Result Value Ref Range Status   Specimen Description PLEURAL FLUID  Final   Special Requests NONE  Final   Gram Stain   Final    WBC PRESENT,BOTH PMN AND MONONUCLEAR NO ORGANISMS SEEN CYTOSPIN SMEAR Performed at [facility], [address] [place], [place] [address]    Report Status 02/17/2021 FINAL  Final  Culture, body fluid w Gram Stain-bottle     Status: None (Preliminary result)   Collection Time: 02/17/21 12:37 PM   Specimen: Pleura  Result Value Ref Range Status   Specimen Description PLEURAL FLUID  Final   Special Requests NONE  Final   Culture   Final    NO GROWTH 4 DAYS Performed at [facility], [address] [place], [place] [address]    Report Status PENDING  Incomplete     Time coordinating discharge: 35 minutes  SIGNED: Lanae Boastamesh Lorana Maffeo, MD  [facility] 02/21/2021, 4:50 PM  If 7PM-7AM, please contact night-coverage [email or web address]

## 2021-02-21 NOTE — Progress Notes (Signed)
Heart Failure Stewardship Pharmacist Progress Note   PCP: Paulina Fusi, MD PCP-Cardiologist: Parke Poisson, MD    HPI:  50 yo F with PMH of T1DM, HTN, HLD, peripheral neuropathy, fibromyalgia, and recent ORIF s/p ankle fracture. She presented to the ED on 02/14/21 with shortness of breath and bilateral LE edema x 2 weeks. Negative for DVT. CXR showed mild interstitial edema and moderate pleural effusions. An ECHO was done on 02/15/21 and found to have LVEF of 25-30% with G3DD and normal RV. R/LHC done today and found to have 50% stenosis in LAD and 90% stenosis in midCx. S/p DES to mid circ. RA 6, PAP 23, PCWP 9, CO 5.02, CI 2.54.   Current HF Medications: Metoprolol XL 50 mg daily Entresto 24/26 mg BID  Prior to admission HF Medications: Lisinopril 20 mg daily  Pertinent Lab Values: Serum creatinine 0.68, BUN 15, Potassium 4.0, Sodium 136, BNP 624.2, Magnesium 2.0   Vital Signs: Weight: 179 lbs (admission weight: 200 lbs) Blood pressure: 100-130/80s  Heart rate: 110s   Medication Assistance / Insurance Benefits Check: Does the patient have prescription insurance?  Yes Type of insurance plan: [employer]  Outpatient Pharmacy:  Prior to admission outpatient pharmacy: [facility] Is the patient willing to use [facility] TOC pharmacy at discharge? Yes Is the patient willing to transition their outpatient pharmacy to utilize a [facility] outpatient pharmacy?   Pending    Assessment: 1. 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 NYHA class II symptoms. - Off IV lasix following RHC - Agree with starting metoprolol XL 50 mg daily - Continue Entresto 24/26 mg BID - Spironolactone held with low BP and possible overdiuresis. Consider restarting at follow up. - No SGLT2i with T1DM   Plan: 1) Medication changes recommended at this time: - Start spironolactone prior to discharge  2) Patient assistance: - HF TOC appt made for 8/2    3)  Education  - To be completed prior to discharge  Sharen Hones, PharmD, BCPS Heart Failure Stewardship Pharmacist Phone [phone]

## 2021-02-21 NOTE — Progress Notes (Signed)
Occupational Therapy Treatment Patient Details Name: Amanda Frederick MRN: [id number] DOB: [birth year] Today's Date: 02/21/2021    History of present illness 50 y.o. female presents to [facility] ED on 02/14/2021, referred from outpatient ortho due to 2 weeks of LE and abdominal swelling and orthopnea. Pt had L ankle ORIF on 6 weeks ago to repair bimalleolar ankle fx. Pt admitted for acute CHF and pleural effusion management.  L thoracentesis completed 7/24. Cardiac cath completed 7/26. PMH includes fibromyalgia diabetes mellitus on insulin pump, HTN, HLD, diabetic neuropathy.   OT comments  Pt received awake and alert, agreeable to OT session to address EOB ADLs and functional transfers. Pt is progressing toward established goals, however, limited with decrease in BP levels with activity (see below for details). Pt presented socks to don in long sitting with min A, transitioned to EOB with mod I, set up for grooming/oral care. Pt educated of WBAT status, however, reports continuous pain and limited movement due to tight calf muscles and limited ROM. Pt Min Guard with sit to stand with RW and continues to not bear weight. Pt will benefit to continue skilled OT to ensure safety with ADLs and to maximize independence. DC recommendation and frequency remains the same.    Follow Up Recommendations  Outpatient OT;Supervision/Assistance - 24 hour    Equipment Recommendations  Tub/shower seat    Recommendations for Other Services      Precautions / Restrictions Precautions Precautions: Fall Restrictions Weight Bearing Restrictions: Yes LLE Weight Bearing: Weight bearing as tolerated Other Position/Activity Restrictions: confirmed WBAT L ankle via secure chat with Dr. Magnus Ivan on 02/20/2021 upon completion of PT session. Pt treated as NWB L ankle during this session. (from prior PT session)       Mobility Bed Mobility Overal bed mobility: Needs Assistance Bed Mobility: Supine to Sit;Sit to Supine      Supine to sit: Modified independent (Device/Increase time) Sit to supine: Modified independent (Device/Increase time)   General bed mobility comments: No physical assistance or cueing required this session.    Transfers Overall transfer level: Needs assistance Equipment used: Rolling walker (2 wheeled) Transfers: Sit to/from Stand Sit to Stand: Min guard;From elevated surface         General transfer comment: mobility limited due to BP levels 97/59 in standing. Presented back to supine with increased of 104/74 BP, RN advised    Balance Overall balance assessment: Needs assistance Sitting-balance support: No upper extremity supported;Feet supported Sitting balance-Leahy Scale: Good     Standing balance support: Bilateral upper extremity supported Standing balance-Leahy Scale: Poor Standing balance comment: reliant on UE support of RW to maintain balance through SLS on RLE                           ADL either performed or assessed with clinical judgement   ADL Overall ADL's : Needs assistance/impaired     Grooming: Wash/dry face;Oral care;Set up;Sitting Grooming Details (indicate cue type and reason): seated EOB     Lower Body Bathing: Minimal assistance;Sitting/lateral leans Lower Body Bathing Details (indicate cue type and reason): Long sitting donning of socks. Pt required assist initiate donning of L sock, pt able to complete pull up of sock. Pt is limited with LLE movement due to pain and limited ROM.         Toilet Transfer: Min Probation officer Details (indicate cue type and reason): initiated with sit to stand transition. Informed pt of WB status per MD (  see prior PT note). Pt still limited with pain and tightness of calf to fully plant foot and bear weight. Pt report feeling dizzy and presented back to supine. BP levels were 119/84 sitting EOB, 97/59 standing and 104/74 supine.           General ADL Comments: functional  mobility for safety due to c/o of dizziness and drop in BP.     Vision   Vision Assessment?: No apparent visual deficits   Perception     Praxis      Cognition Arousal/Alertness: Awake/alert Behavior During Therapy: WFL for tasks assessed/performed;Anxious Overall Cognitive Status: Within Functional Limits for tasks assessed                                 General Comments: less anxiety observed this session.        Exercises     Shoulder Instructions       General Comments HR range 109-113 this session.    Pertinent Vitals/ Pain       Pain Assessment: Faces Faces Pain Scale: Hurts little more Pain Location: LLE Pain Descriptors / Indicators: Grimacing Pain Intervention(s): Monitored during session;Repositioned  Home Living                                          Prior Functioning/Environment              Frequency  Min 2X/week        Progress Toward Goals  OT Goals(current goals can now be found in the care plan section)  Progress towards OT goals: Progressing toward goals  Acute Rehab OT Goals Patient Stated Goal: to feel better, return home OT Goal Formulation: With patient Time For Goal Achievement: 03/04/21 Potential to Achieve Goals: Fair ADL Goals Pt Will Perform Grooming: with min guard assist;standing Pt Will Perform Lower Body Bathing: with min guard assist;sit to/from stand Pt Will Perform Lower Body Dressing: with min guard assist;sit to/from stand Pt Will Transfer to Toilet: with min guard assist;stand pivot transfer;bedside commode Pt Will Perform Toileting - Clothing Manipulation and hygiene: with supervision;sit to/from stand  Plan Discharge plan remains appropriate    Co-evaluation                 AM-PAC OT "6 Clicks" Daily Activity     Outcome Measure   Help from another person eating meals?: None Help from another person taking care of personal grooming?: A Little Help from another  person toileting, which includes using toliet, bedpan, or urinal?: A Little Help from another person bathing (including washing, rinsing, drying)?: A Lot Help from another person to put on and taking off regular upper body clothing?: A Little Help from another person to put on and taking off regular lower body clothing?: A Lot 6 Click Score: 17    End of Session Equipment Utilized During Treatment: Rolling walker;Gait belt (BSC)  OT Visit Diagnosis: Unsteadiness on feet (R26.81);Other abnormalities of gait and mobility (R26.89);Repeated falls (R29.6);Muscle weakness (generalized) (M62.81);Pain   Activity Tolerance Patient limited by pain   Patient Left in bed;with call bell/phone within reach;with bed alarm set;with family/visitor present   Nurse Communication Mobility status        Time: 1000-1037 OT Time Calculation (min): 37 min  Charges: OT General Charges $OT Visit: 1 Visit OT  Treatments $Self Care/Home Management : 23-37 mins  Marquette Old, MSOT, OTR/L  Supplemental Rehabilitation Services  [phone]    Zigmund Daniel 02/21/2021, 1:05 PM

## 2021-02-21 NOTE — Progress Notes (Addendum)
Progress Note  Patient Name: Amanda Frederick Date of Encounter: 02/21/2021  Primary Cardiologist: Parke Poisson, MD  Subjective    Patient is eager to go home   Says her breathing is OK   she denies CP    Inpatient Medications    Scheduled Meds:  aspirin  81 mg Oral Daily   atorvastatin  80 mg Oral Daily   enoxaparin (LOVENOX) injection  40 mg Subcutaneous Q24H   insulin pump   Subcutaneous Q4H   pantoprazole  40 mg Oral Daily   sacubitril-valsartan  1 tablet Oral BID   senna-docusate  1 tablet Oral BID   sodium chloride flush  3 mL Intravenous Q12H   sodium chloride flush  3 mL Intravenous Q12H   ticagrelor  90 mg Oral BID   Continuous Infusions:  sodium chloride     sodium chloride     PRN Meds: sodium chloride, sodium chloride, acetaminophen **OR** acetaminophen, acetaminophen, diazepam, HYDROcodone-acetaminophen, ondansetron (ZOFRAN) IV, oxyCODONE, sodium chloride flush, sodium chloride flush, tiZANidine   Vital Signs    Vitals:   02/20/21 2200 02/20/21 2300 02/21/21 0006 02/21/21 0404  BP:   (!) 134/94 136/86  Pulse: (!) 112 (!) 102 (!) 108 (!) 109  Resp: 15 17 14 19   Temp:   97.9 F (36.6 C) 97.9 F (36.6 C)  TempSrc:   Oral Oral  SpO2: 97% 95% 98% 95%  Weight:    81.5 kg  Height:        Intake/Output Summary (Last 24 hours) at 02/21/2021 0829 Last data filed at 02/21/2021 0723 Gross per 24 hour  Intake 600 ml  Output 2500 ml  Net -1900 ml   Filed Weights   02/20/21 0432 02/20/21 0452 02/21/21 0404  Weight: 82 kg 80.4 kg 81.5 kg    Telemetry  SR / ST    Avg rate 100s  Personally reviewed.  ECG    No new EKG to review today Physical Exam   GGEN: Well nourished, well developed in no acute distress  Sitting in bed HR 122 HEENT: Normal NECK: No JVD;  CARDIAC:RRR, no murmurs, rubs, gallops RESPIRATORY:  Clear to auscultation without rales, wheezing or rhonchi  ABDOMEN: Soft, non-tender, non-distended MUSCULOSKELETAL:  No edema; No  deformity  SKIN: Warm and dry NEUROLOGIC:  Alert and oriented x 3 PSYCHIATRIC:  Normal affect   Labs    Chemistry Recent Labs  Lab 02/15/21 0248 02/15/21 0409 02/16/21 1225 02/17/21 1258 02/18/21 0841 02/19/21 0023 02/19/21 1214 02/19/21 1231 02/20/21 0102 02/21/21 0454  NA  --  142   < >  --    < > 140   < > 140 137 136  K  --  3.6   < >  --    < > 4.1   < > 4.2 4.2 4.0  CL  --  107   < >  --    < > 103  --   --  104 106  CO2  --  24   < >  --    < > 28  --   --  26 22  GLUCOSE  --  143*   < >  --    < > 110*  --   --  160* 179*  BUN  --  15   < >  --    < > 17  --   --  20 15  CREATININE  --  0.73   < >  --    < >  0.88  --   --  0.88 0.68  CALCIUM  --  8.7*   < >  --    < > 8.9  --   --  8.8* 8.9  PROT 6.1* 5.7*  --   --   --   --   --   --   --   --   ALBUMIN 3.3* 3.2*  --  3.0*  --   --   --   --   --   --   AST 30 26  --   --   --   --   --   --   --   --   ALT 36 34  --   --   --   --   --   --   --   --   ALKPHOS 199* 183*  --   --   --   --   --   --   --   --   BILITOT 1.1 1.0  --   --   --   --   --   --   --   --   GFRNONAA  --  >60   < >  --    < > >60  --   --  >60 >60  ANIONGAP  --  11   < >  --    < > 9  --   --  7 8   < > = values in this interval not displayed.     Hematology Recent Labs  Lab 02/18/21 0841 02/19/21 1214 02/19/21 1231 02/20/21 0102 02/21/21 0454  WBC 6.9  --   --  6.1 6.7  RBC 5.83*  --   --  5.57* 5.66*  HGB 15.4*   < > 17.0* 14.9 15.0  HCT 48.7*   < > 50.0* 46.8* 47.6*  MCV 83.5  --   --  84.0 84.1  MCH 26.4  --   --  26.8 26.5  MCHC 31.6  --   --  31.8 31.5  RDW 13.4  --   --  13.3 13.5  PLT 252  --   --  232 223   < > = values in this interval not displayed.    Cardiac Enzymes Recent Labs  Lab 02/14/21 1859 02/14/21 2121 02/15/21 0248 02/15/21 0409 02/16/21 1225  TROPONINIHS 27* 24* 23* 23* 22*    BNP Recent Labs  Lab 02/14/21 2121 02/19/21 0834  BNP 624.2* 259.9*     DDimer Recent Labs  Lab  02/15/21 0409  DDIMER 0.90*   Lab Results  Component Value Date   TSH 2.108 02/19/2021      Radiology    DG Chest 2 View  Result Date: 02/19/2021 CLINICAL DATA:  CHF.  Intermittent shortness of breath. EXAM: CHEST - 2 VIEW COMPARISON:  02/17/2021 and 03/02/2020 FINDINGS: Patchy densities in the right lower chest are similar to the previous examination. Suspect some of these densities are chronic based on the older comparison examination. Heart size is normal without overt pulmonary edema. Aeration at the left lung base has improved although there may still be very small pleural effusions based on the lateral view. Trachea is midline. No acute bone abnormality. IMPRESSION: 1. Improving aeration at the left lung base. 2. Probable very small pleural effusions. 3. No evidence for cardiomegaly or CHF. 4. Patchy densities in the right lower lung may represent a combination of acute on chronic changes.  Findings are similar to the recent comparison examination. Electronically Signed   By: Richarda Overlie M.D.   On: 02/19/2021 11:46   CT Angio Chest Pulmonary Embolism (PE) W or WO Contrast  Result Date: 02/20/2021 CLINICAL DATA:  Inpatient. Left lower extremity swelling proved increased dyspnea. PE suspected, low-intermediate probability, positive D-dimer. EXAM: CT ANGIOGRAPHY CHEST WITH CONTRAST TECHNIQUE: Multidetector CT imaging of the chest was performed using the standard protocol during bolus administration of intravenous contrast. Multiplanar CT image reconstructions and MIPs were obtained to evaluate the vascular anatomy. CONTRAST:  48mL OMNIPAQUE IOHEXOL 350 MG/ML SOLN COMPARISON:  Chest radiograph from one day prior. FINDINGS: Cardiovascular: The study is moderate quality for the evaluation of pulmonary embolism, with some motion degradation. There are no filling defects in the central, lobar, segmental or subsegmental pulmonary artery branches to suggest acute pulmonary embolism. Mildly  atherosclerotic nonaneurysmal thoracic aorta. Normal caliber pulmonary arteries. Top normal heart size. No significant pericardial fluid/thickening. Mediastinum/Nodes: No discrete thyroid nodules. Unremarkable esophagus. No pathologically enlarged axillary, mediastinal or hilar lymph nodes. Lungs/Pleura: No pneumothorax. Small dependent bilateral pleural effusions. No acute consolidative airspace disease or lung masses. Peripheral right lower lobe solid 5 mm pulmonary nodule (series 7/image 53). No additional significant pulmonary nodules. Mild to moderate patchy ground-glass opacities throughout the periphery of both lungs with scattered mildly thickened curvilinear parenchymal bands with associated mild parenchymal distortion, most prominent in the right middle lobe and perilobular lower lobes. Upper abdomen: No acute abnormality. Musculoskeletal: No aggressive appearing focal osseous lesions. Mild thoracic spondylosis. Review of the MIP images confirms the above findings. IMPRESSION: 1. No pulmonary embolism. 2. Small dependent bilateral pleural effusions. Top-normal heart size. 3. Mild to moderate patchy ground-glass opacities throughout the periphery of both lungs with associated mild parenchymal distortion and curvilinear parenchymal bands, most prominent in the right middle lobe and perilobular lower lobes. Differential includes mild pulmonary edema, atypical pneumonia and/or postinflammatory fibrosis. 4. Right lower lobe 5 mm solid pulmonary nodule. No follow-up needed if patient is low-risk. Non-contrast chest CT can be considered in 12 months if patient is high-risk. This recommendation follows the consensus statement: Guidelines for Management of Incidental Pulmonary Nodules Detected on CT Images: From the Fleischner Society 2017; Radiology 2017; 284:228-243. 5. Aortic Atherosclerosis (ICD10-I70.0). Electronically Signed   By: Delbert Phenix M.D.   On: 02/20/2021 18:04   CARDIAC CATHETERIZATION  Result  Date: 02/19/2021 Formatting of this result is different from the original.   1st Mrg lesion is 50% stenosed.   2nd Mrg lesion is 50% stenosed.   2nd Diag lesion is 50% stenosed.   Mid LAD to Dist LAD lesion is 50% stenosed.   Mid Cx lesion is 90% stenosed.   A drug-eluting stent was successfully placed using a STENT ONYX FRONTIER 2.75X22, postdilated to 3.25 mm.   Post intervention, there is a 0% residual stenosis.   The left ventricular systolic function is normal.   LV end diastolic pressure is normal.   The left ventricular ejection fraction is 25-35% by visual estimate.   There is no aortic valve stenosis.   Ao sat 100%, PA sat 75%, PA pressure 34/15, mean PA pressure 23 mm Hg; mean PCWP 9 mm Hg; CO 5.02 L/min; CI 2.54 Continue aggressive medical therapy for her LV dysfunction.  Cardiomyopathy may have aspects of both ischemic and nonischemic.  Diffuse, small vessel disease noted in the diagonal, distal LAD and obtuse marginal vessels. After 12 months of dual antiplatelet therapy with Brilinta, consider clopidogrel monotherapy given  her diffuse CAD.    Cardiac Cath 01/2021 Conclusion      1st Mrg lesion is 50% stenosed.   2nd Mrg lesion is 50% stenosed.   2nd Diag lesion is 50% stenosed.   Mid LAD to Dist LAD lesion is 50% stenosed.   Mid Cx lesion is 90% stenosed.   A drug-eluting stent was successfully placed using a STENT ONYX FRONTIER 2.75X22, postdilated to 3.25 mm.   Post intervention, there is a 0% residual stenosis.   The left ventricular systolic function is normal.   LV end diastolic pressure is normal.   The left ventricular ejection fraction is 25-35% by visual estimate.   There is no aortic valve stenosis.   Ao sat 100%, PA sat 75%, PA pressure 34/15, mean PA pressure 23 mm Hg; mean PCWP 9 mm Hg; CO 5.02 L/min; CI 2.54   Continue aggressive medical therapy for her LV dysfunction.  Cardiomyopathy may have aspects of both ischemic and nonischemic.  Diffuse, small vessel disease noted  in the diagonal, distal LAD and obtuse marginal vessels.   After 12 months of dual antiplatelet therapy with Brilinta, consider clopidogrel monotherapy given her diffuse CAD.   Coronary Diagrams   Diagnostic Dominance: Right    Intervention     Patient Profile     50 y.o. female with a history of type 1 diabetes mellitus, hypertension, hyperlipidemia, peripheral neuropathy, fibromyalgia, and ORIF 6 weeks ago for ankle fracture now presenting with newly documented systolic heart failure and cardiomyopathy.  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  Mild pulmonary edema   vs atypical pneumonia vs postinflamm fibrosis  For now I favor some resolving edema   Currently on Entresto and aldactone  Follow BP and renal function for dosing  I added Toprol XL today  Chose instead of Coreg to allow titration for HR control with a little less impact on BP compared to Coreg  Will need follow up as outpt, possible repeat CT of lungs based on clinical respnioe    Essential hypertension -  On Entresto Adding Toprol today   Follow   Tachycardia     Pt with mild sinus tachycardia      Thyroid OK Will check UA  She does not appear dry    Ask for orthostatics though difficult with foot injury  Type 1 diabetes mellitus. - continue Insulin pump - HbA1C 6.8% this admit  Mixed hyperlipidemia. - LDL goal < 70 - LDL was 97 this admit - on high-dose  statin with atorvastatin 80mg  daily >added this admit - will need FLP and ALT in 6 weeks   Pt wants to go home   Says she was told yesterday she would be  I think it is not unreasonable   This can be followed as outpt  I have spent a total of 35 minutes with patient reviewing cardiac cath, 2D echo , telemetry, EKGs, labs and examining patient as well as establishing an assessment and plan that was discussed with the patient.  > 50% of time was spent in direct patient care.     Signed, Dietrich Pates, MD  02/21/2021, 8:29 AM

## 2021-02-21 NOTE — Progress Notes (Signed)
PROGRESS NOTE    Amanda Frederick  ION:[id number] DOB: [birth year] DOA: 02/14/2021 PCP: Paulina Fusi, MD   Chief Complaint  Patient presents with   Leg Swelling   Shortness of Breath   Brief Narrative: 50 year old female with fibromyalgia diabetes mellitus on insulin pump, HTN, HLD, diabetic neuropathy status post bimalleolar ankle fracture repair on 6/10 by Dr. Magnus Ivan presents with bilateral lower extremity swelling, worsening shortness of breath x2 weeks and also abdominal girth increase, weight gain and inability to lay flat, occasional chest pain worse with exertion.  She was seen in the ED and admitted for acute systolic CHF, moderate to large pleural effusion.  Underwent left thoracentesis 700 cc of fluid removed by IR 7/24, seen by cardiology and being diuresed Underwent cardiac cath with codominant coronary arteries with diffuse 50% stenosis in the LAD, OM1, OM 2 and D2 and 90% M LCx status post PCI. 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 KGM:WNUU 25 to 30% restrictive diastolic filling pattern G3 DD severe left ventricular dysfunction.  As per cardiology patient will continue on Aldactone,Entresto 24-26.  Underwent CT angio chest no PE but mild to moderate patchy groundglass opacities throughout the periphery of both lungs-differential pulmonary edema atypical pneumonia/postinflammatory fibrosis-suspect it is secondary to CHF.  Checked procalcitonin level- and stable, 0.22 no e/o infection.  Net IO Since Admission: -12,573.69 mL [02/21/21 1142] .  Filed Weights   02/20/21 0432 02/20/21 0452 02/21/21 0404  Weight: 82 kg 80.4 kg 81.5 kg   Orthostatic dizziness/hypotension this afternoon:  Monitor blood pressure, await further recommendation from cardiology Dr. Tenny Craw saw today and discussed. ?  IV fluid versus wean down cardiac meds Sinus tachycardia TSH normal monitor. UA done for sp gravity- 1.008, orthostatic vitals  sitting to standing: 92/64>99/71>105/80. Patient will need very close follow-up with cardiology once she is discharged.   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  Bilateral pleural effusion left greater than right status post thoracentesis   DM: Well-controlled on insulin pump.patient is on regular diet per request.  HBA1c stable 6.8 . Recent Labs  Lab 02/20/21 2027 02/21/21 0005 02/21/21 0400 02/21/21 0739 02/21/21 1131  GLUCAP 283* 175* 199* 139* 252*  Essential hypertension: BP is well controlled-CHF.  ?OSA, needing 2l [place].  Will benefit with outpatient sleep apnea evaluations once acute issue resolved  Fibromyalgia  Recent left ankle surgery postrepair PT OT, fall precaution pain control.  Diet Order             Diet Heart Room service appropriate? Yes; Fluid consistency: Thin  Diet effective now                   Patient's Body mass index is 29 kg/m.  DVT prophylaxis: enoxaparin (LOVENOX) injection 40 mg Start: 02/17/21 1000 Code Status:   Code Status: Full Code  Family Communication: plan of care discussed with patient at bedside. Status is: Inpatient Remains inpatient appropriate because:IV treatments appropriate due to intensity of illness or inability to take PO and Inpatient level of care appropriate due to severity of illness Dispo:  Patient From: Home  Planned Disposition: Home with Health Care Svc ocne cleared by cards. PTOT and ambulation  Medically stable for discharge: No     Unresulted Labs (From admission, onward)     Start     Ordered    02/20/21 0500  Basic metabolic panel  Daily,   R     Question:  Specimen collection method  Answer:  Lab=Lab collect   02/19/21 1340   02/15/21 0500  CBC  Every third day,   R      02/15/21 0051          Medications reviewed:  Scheduled Meds:  aspirin  81 mg Oral Daily   atorvastatin  80 mg Oral Daily   enoxaparin (LOVENOX) injection  40 mg Subcutaneous Q24H   insulin pump   Subcutaneous Q4H   metoprolol succinate  50 mg Oral Daily   pantoprazole  40 mg Oral Daily   sacubitril-valsartan  1 tablet Oral BID   senna-docusate  1 tablet Oral BID   sodium chloride flush  3 mL Intravenous Q12H   sodium chloride flush  3 mL Intravenous Q12H   ticagrelor  90 mg Oral BID   Continuous Infusions:  sodium chloride     sodium chloride     Consultants:see note  Procedures:  Cardiac cath:02/19/2021   1st Mrg lesion is 50% stenosed.   2nd Mrg lesion is 50% stenosed.   2nd Diag lesion is 50% stenosed.   Mid LAD to Dist LAD lesion is 50% stenosed.   Mid Cx lesion is 90% stenosed.   A drug-eluting stent was successfully placed using a STENT ONYX FRONTIER 2.75X22, postdilated to 3.25 mm.   Post intervention, there is a 0% residual stenosis.   The left ventricular systolic function is normal.   LV end diastolic pressure is normal.   The left ventricular ejection fraction is 25-35% by visual estimate.   There is no aortic valve stenosis.   Ao sat 100%, PA sat 75%, PA pressure 34/15, mean PA pressure 23 mm Hg; mean PCWP 9 mm Hg; CO 5.02 L/min; CI 2.54   Continue aggressive medical therapy for her LV dysfunction.  Cardiomyopathy may have aspects of both ischemic and nonischemic.  Diffuse, small vessel disease noted in the diagonal, distal LAD and obtuse marginal vessels.   After 12 months of dual antiplatelet therapy with Brilinta, consider clopidogrel monotherapy given her diffuse CAD.  CT angio chest 1. No pulmonary embolism. 2. Small dependent bilateral pleural effusions. Top-normal  heart size. 3. Mild to moderate patchy ground-glass opacities throughout the periphery of both lungs with associated mild parenchymal distortion and curvilinear parenchymal bands, most prominent in the right middle lobe and perilobular lower lobes. Differential includes mild pulmonary edema, atypical pneumonia and/or postinflammatory fibrosis. 4. Right lower lobe 5 mm solid pulmonary nodule. No follow-up needed if patient is low-risk. Non-contrast chest CT can be considered in 12 months if patient is high-risk. This recommendation follows the  Antimicrobials: Anti-infectives (From admission, onward)    None      Culture/Microbiology    Component Value Date/Time   SDES PLEURAL FLUID 02/17/2021 1237   SDES PLEURAL FLUID 02/17/2021 1237   SPECREQUEST NONE 02/17/2021 1237   SPECREQUEST NONE 02/17/2021 1237   CULT  02/17/2021 1237    NO GROWTH 3 DAYS Performed at [facility], [address] [place], [place] [address]    REPTSTATUS 02/17/2021 FINAL 02/17/2021 1237   REPTSTATUS PENDING 02/17/2021 1237    Other culture-see note  Objective: Vitals: Today's Vitals   02/21/21 0006 02/21/21 0404 02/21/21 0753 02/21/21 0837  BP: (!) 134/94 136/86    Pulse: (!) 108 (!) 109    Resp: 14 19    Temp: 97.9 F (36.6 C) 97.9 F (36.6 C)    TempSrc: Oral Oral    SpO2: 98% 95%    Weight:  81.5 kg    Height:      PainSc:   7  2     Intake/Output Summary (Last 24 hours) at 02/21/2021 1142 Last data filed at 02/21/2021 1000 Gross per 24 hour  Intake 360 ml  Output 3000 ml  Net -2640 ml   Filed Weights   02/20/21 0432 02/20/21 0452 02/21/21 0404  Weight: 82 kg 80.4 kg 81.5 kg   Weight change: -0.5 kg  Intake/Output from previous day: 07/27 0701 - 07/28 0700 In: 600 [P.O.:600] Out: 2000 [Urine:2000] Intake/Output this shift: Total I/O In: -  Out: 1000 [Urine:1000] Filed Weights   02/20/21 0432 02/20/21 0452 02/21/21 0404  Weight: 82 kg 80.4 kg 81.5 kg    Examination:  General exam: AAOx 3 older than stated age, weak appearing. HEENT:Oral mucosa moist, Ear/Nose WNL grossly, dentition normal. Respiratory system: bilaterally diminished,  no use of accessory muscle Cardiovascular system: S1 & S2 +, No JVD,. Gastrointestinal system: Abdomen soft, NT,ND, BS+ Nervous System:Alert, awake, moving extremities and grossly nonfocal Extremities: no edema, distal peripheral pulses palpable.  Skin: No rashes,no icterus. MSK: Normal muscle bulk,tone, power   Data Reviewed: I have personally reviewed following labs and imaging studies CBC: Recent Labs  Lab 02/14/21 1859 02/15/21 0249 02/15/21 0409 02/16/21 1225 02/18/21 0841 02/19/21 1214 02/19/21 1230 02/19/21 1231 02/20/21 0102 02/21/21 0454  WBC 9.3   < > 7.4 8.4 6.9  --   --   --  6.1 6.7  NEUTROABS 7.1  --  5.5  --   --   --   --   --   --   --   HGB 15.1*   < > 14.3 14.5 15.4* 17.0* 17.3* 17.0* 14.9 15.0  HCT 48.3*   < > 45.2 45.6 48.7* 50.0* 51.0* 50.0* 46.8* 47.6*  MCV 86.3   < > 85.9 84.0 83.5  --   --   --  84.0 84.1  PLT 296   < > 260 263 252  --   --   --  232 223   < > = values in this interval not displayed.   Basic Metabolic Panel: Recent Labs  Lab 02/15/21 0248 02/15/21 0409 02/16/21 1225 02/17/21 0230 02/18/21 0841 02/19/21 0023 02/19/21 0221 02/19/21 1214 02/19/21 1230 02/19/21 1231 02/20/21 0102 02/21/21 0454  NA  --    < > 138 138 139 140  --  141 140 140 137 136  K  --    < > 3.7 3.8 4.1 4.1  --  4.0 4.2 4.2 4.2 4.0  CL  --    < > 101 103 102 103  --   --   --   --  104 106  CO2  --    < > 29 28 27 28   --   --   --   --  26 22  GLUCOSE  --    < > 102* 109* 115* 110*  --   --   --   --  160* 179*  BUN  --    < > 14 17 12 17   --   --   --   --  20 15  CREATININE  --    < > 0.82 0.77 0.89 0.88  --   --   --   --  0.88 0.68  CALCIUM  --    < > 8.8* 8.8* 9.2 8.9  --   --   --   --  8.8* 8.9  MG 2.0  --  1.8  --  2.0  --  2.0  --   --   --   --   --   PHOS  4.6  --  4.2  --  4.8*  --  4.3  --   --   --   --   --    < > = values in this interval not displayed.   GFR: Estimated Creatinine Clearance: 90.6 mL/min (by C-G formula based on SCr of 0.68 mg/dL). Liver Function Tests: Recent Labs  Lab 02/15/21 0248 02/15/21 0409 02/17/21 1258  AST 30 26  --   ALT 36 34  --   ALKPHOS 199* 183*  --   BILITOT 1.1 1.0  --   PROT 6.1* 5.7*  --   ALBUMIN 3.3* 3.2* 3.0*   No results for input(s): LIPASE, AMYLASE in the last 168 hours. No results for input(s): AMMONIA in the last 168 hours. Coagulation Profile: Recent Labs  Lab 02/14/21 1859  INR 1.1   Cardiac Enzymes: Recent Labs  Lab 02/15/21 0248  CKTOTAL 337*   BNP (last 3 results) No results for input(s): PROBNP in the last 8760 hours. HbA1C: No results for input(s): HGBA1C in the last 72 hours. CBG: Recent Labs  Lab 02/20/21 2027 02/21/21 0005 02/21/21 0400 02/21/21 0739 02/21/21 1131  GLUCAP 283* 175* 199* 139* 252*   Lipid Profile: No results for input(s): CHOL, HDL, LDLCALC, TRIG, CHOLHDL, LDLDIRECT in the last 72 hours. Thyroid Function Tests: Recent Labs    02/19/21 0221  TSH 2.108   Anemia Panel: No results for input(s): VITAMINB12, FOLATE, FERRITIN, TIBC, IRON, RETICCTPCT in the last 72 hours. Sepsis Labs: Recent Labs  Lab 02/21/21 0454  PROCALCITON 0.22    Recent Results (from the past 240 hour(s))  Resp Panel by RT-PCR (Flu A&B, Covid) Nasopharyngeal Swab     Status: None   Collection Time: 02/14/21 11:12 PM   Specimen: Nasopharyngeal Swab; Nasopharyngeal(NP) swabs in vial transport medium  Result Value Ref Range Status   SARS Coronavirus 2 by RT PCR NEGATIVE NEGATIVE Final    Comment: (NOTE) SARS-CoV-2 target nucleic acids are NOT DETECTED.  The SARS-CoV-2 RNA is generally detectable in upper respiratory specimens during the acute phase of infection. The lowest concentration of SARS-CoV-2 viral copies this assay can detect is 138 copies/mL. A  negative result does not preclude SARS-Cov-2 infection and should not be used as the sole basis for treatment or other patient management decisions. A negative result may occur with  improper specimen collection/handling, submission of specimen other than nasopharyngeal swab, presence of viral mutation(s) within the areas targeted by this assay, and inadequate number of viral copies(<138 copies/mL). A negative result must be combined with clinical observations, patient history, and epidemiological information. The expected result is Negative.  Fact Sheet for Patients:  BloggerCourse.com  Fact Sheet for Healthcare Providers:  SeriousBroker.it  This test is no t yet approved or cleared by the [place] FDA and  has been authorized for detection and/or diagnosis of SARS-CoV-2 by FDA under an Emergency Use Authorization (EUA). This EUA will remain  in effect (meaning this test can be  used) for the duration of the COVID-19 declaration under Section 564(b)(1) of the Act, 21 U.S.C.section 360bbb-3(b)(1), unless the authorization is terminated  or revoked sooner.       Influenza A by PCR NEGATIVE NEGATIVE Final   Influenza B by PCR NEGATIVE NEGATIVE Final    Comment: (NOTE) The Xpert Xpress SARS-CoV-2/FLU/RSV plus assay is intended as an aid in the diagnosis of influenza from Nasopharyngeal swab specimens and should not be used as a sole basis for treatment. Nasal washings and aspirates are unacceptable for Xpert Xpress SARS-CoV-2/FLU/RSV testing.  Fact Sheet for Patients: BloggerCourse.com  Fact Sheet for Healthcare Providers: SeriousBroker.it  This test is not yet approved or cleared by the [place] FDA and has been authorized for detection and/or diagnosis of SARS-CoV-2 by FDA under an Emergency Use Authorization (EUA). This EUA will remain in effect (meaning this test can  be used) for the duration of the COVID-19 declaration under Section 564(b)(1) of the Act, 21 U.S.C. section 360bbb-3(b)(1), unless the authorization is terminated or revoked.  Performed at [facility], [address] [place], [place] [address]   Gram stain     Status: None   Collection Time: 02/17/21 12:37 PM   Specimen: Pleura  Result Value Ref Range Status   Specimen Description PLEURAL FLUID  Final   Special Requests NONE  Final   Gram Stain   Final    WBC PRESENT,BOTH PMN AND MONONUCLEAR NO ORGANISMS SEEN CYTOSPIN SMEAR Performed at [facility], [address] [place], [place] [address]    Report Status 02/17/2021 FINAL  Final  Culture, body fluid w Gram Stain-bottle     Status: None (Preliminary result)   Collection Time: 02/17/21 12:37 PM   Specimen: Pleura  Result Value Ref Range Status   Specimen Description PLEURAL FLUID  Final   Special Requests NONE  Final   Culture   Final    NO GROWTH 3 DAYS Performed at [facility], [address], [place], [place] [address]    Report Status PENDING  Incomplete      Radiology Studies: CT Angio Chest Pulmonary Embolism (PE) W or WO Contrast  Result Date: 02/20/2021 CLINICAL DATA:  Inpatient. Left lower extremity swelling proved increased dyspnea. PE suspected, low-intermediate probability, positive D-dimer. EXAM: CT ANGIOGRAPHY CHEST WITH CONTRAST TECHNIQUE: Multidetector CT imaging of the chest was performed using the standard protocol during bolus administration of intravenous contrast. Multiplanar CT image reconstructions and MIPs were obtained to evaluate the vascular anatomy. CONTRAST:  75mL OMNIPAQUE IOHEXOL 350 MG/ML SOLN COMPARISON:  Chest radiograph from one day prior. FINDINGS: Cardiovascular: The study is moderate quality for the evaluation of pulmonary embolism, with some motion degradation. There are no filling defects in the central, lobar, segmental or subsegmental pulmonary artery branches to  suggest acute pulmonary embolism. Mildly atherosclerotic nonaneurysmal thoracic aorta. Normal caliber pulmonary arteries. Top normal heart size. No significant pericardial fluid/thickening. Mediastinum/Nodes: No discrete thyroid nodules. Unremarkable esophagus. No pathologically enlarged axillary, mediastinal or hilar lymph nodes. Lungs/Pleura: No pneumothorax. Small dependent bilateral pleural effusions. No acute consolidative airspace disease or lung masses. Peripheral right lower lobe solid 5 mm pulmonary nodule (series 7/image 53). No additional significant pulmonary nodules. Mild to moderate patchy ground-glass opacities throughout the periphery of both lungs with scattered mildly thickened curvilinear parenchymal bands with associated mild parenchymal distortion, most prominent in the right middle lobe and perilobular lower lobes. Upper abdomen: No acute abnormality. Musculoskeletal: No aggressive appearing focal osseous lesions. Mild  thoracic spondylosis. Review of the MIP images confirms the above findings. IMPRESSION: 1. No pulmonary embolism. 2. Small dependent bilateral pleural effusions. Top-normal heart size. 3. Mild to moderate patchy ground-glass opacities throughout the periphery of both lungs with associated mild parenchymal distortion and curvilinear parenchymal bands, most prominent in the right middle lobe and perilobular lower lobes. Differential includes mild pulmonary edema, atypical pneumonia and/or postinflammatory fibrosis. 4. Right lower lobe 5 mm solid pulmonary nodule. No follow-up needed if patient is low-risk. Non-contrast chest CT can be considered in 12 months if patient is high-risk. This recommendation follows the consensus statement: Guidelines for Management of Incidental Pulmonary Nodules Detected on CT Images: From the Fleischner Society 2017; Radiology 2017; 284:228-243. 5. Aortic Atherosclerosis (ICD10-I70.0). Electronically Signed   By: Delbert Phenix M.D.   On: 02/20/2021  18:04   CARDIAC CATHETERIZATION  Result Date: 02/19/2021 Formatting of this result is different from the original.   1st Mrg lesion is 50% stenosed.   2nd Mrg lesion is 50% stenosed.   2nd Diag lesion is 50% stenosed.   Mid LAD to Dist LAD lesion is 50% stenosed.   Mid Cx lesion is 90% stenosed.   A drug-eluting stent was successfully placed using a STENT ONYX FRONTIER 2.75X22, postdilated to 3.25 mm.   Post intervention, there is a 0% residual stenosis.   The left ventricular systolic function is normal.   LV end diastolic pressure is normal.   The left ventricular ejection fraction is 25-35% by visual estimate.   There is no aortic valve stenosis.   Ao sat 100%, PA sat 75%, PA pressure 34/15, mean PA pressure 23 mm Hg; mean PCWP 9 mm Hg; CO 5.02 L/min; CI 2.54 Continue aggressive medical therapy for her LV dysfunction.  Cardiomyopathy may have aspects of both ischemic and nonischemic.  Diffuse, small vessel disease noted in the diagonal, distal LAD and obtuse marginal vessels. After 12 months of dual antiplatelet therapy with Brilinta, consider clopidogrel monotherapy given her diffuse CAD.     LOS: 6 days   Lanae Boast, MD [facility]  02/21/2021, 11:42 AM

## 2021-02-21 NOTE — Progress Notes (Addendum)
[facility]  PCP: Foye Deer Primary Cardiologist: Weston Brass  HPI:  Amanda Frederick is a 50 y.o.  female  with a PMH significant for Fibromyalgia, T1DM, HTN, HLD, neuropathy, chronic sinus tachycardia  Had ORIF for ankle fracture 11/2020.  Afterwards she reported progreesive LE edema and dyspnea ultimately presenting to [facility] ED 02/14/21. On ED arrival, she was  tachycardic. CXR with suspected mild interstitial edema, moderate left and small right pleural effusions. HsT at 27>>24>>23>>23. BNP 624 . She was started on 40mg  BID IV Lasix. Underwent left sided thoracentesis 700cc with benign fluid studies which were transudative.  ECHO 02/15/21 EF 25-30%, apical akinesis, global hypokinesis, G3DD, normal RV size and function.  Given relative acuity and echo findings further workup with [facility] which demonstrated multivessel disease with normal RCA and severe disease in Lcx underwent PCI to Lcx.   RHC with: Ao sat 100%, PA sat 75%, PA pressure 34/15, mean PA pressure 23 mm Hg; mean PCWP 9 mm Hg; CO 5.02 L/min; CI 2.54  She was continued on diuretic. Wt 200>179 lbs.  GDMT titrated and she was discharged on entresto 24/26, toprol XL 50 daily, DAPT and statin.    Here for hospital FU.  Reports feeling better since discharge.  Breathing okay at rest still having some dyspnea on exertion but with some improvement since discharge.  Weighing at home, weight has been stable at around 173-175 at home.    ROS: All systems negative except as listed in HPI, PMH and Problem List.  SH:  Social History   Socioeconomic History   Marital status: Married    Spouse name: [id number]   Number of children: 3   Years of education: college   Highest education level: Not on file  Occupational History    Comment: Home maker  Tobacco Use   Smoking status: Never   Smokeless tobacco: Never  Vaping Use   Vaping Use: Not on file  Substance and Sexual Activity   Alcohol use:  Not Currently    Comment: special occassions   Drug use: No   Sexual activity: Not on file  Other Topics Concern   Not on file  Social History Narrative   Patient lives at home with her husband Leory Plowman. Patient is Leory Plowman. Patient has college education. Patient drinks 4-6 caffeine drinks daily.   Right handed.         Social Determinants of Health   Financial Resource Strain: Not on file  Food Insecurity: Not on file  Transportation Needs: Not on file  Physical Activity: Not on file  Stress: Not on file  Social Connections: Not on file  Intimate Partner Violence: Not on file    FH:  Family History  Problem Relation Age of Onset   Anemia Mother    High Cholesterol Mother    Diabetes Mother    Arthritis Mother     Past Medical History:  Diagnosis Date   AC (acromioclavicular) joint bone spurs    Anemia    Anxiety    Arthritis    Bronchitis    Bulging lumbar disc    Bursitis    Complication of anesthesia    "has died 3 separate times under anesthesia"   Depression    Diabetes (HCC)    Environmental allergies    Fibromyalgia    High blood pressure    High cholesterol    History of COVID-19 01/2020   was hospitalized   History of endometriosis  History of uterine fibroid    Migraines    Neuropathy    eyes , hands and feet    Pinched nerve    Pneumonia    Seizure (HCC)    after eye test only   Spondylosis    Tachycardia    after COVID   Tendonitis    Vision abnormalities     Current Outpatient Medications  Medication Sig Dispense Refill   aspirin 81 MG chewable tablet Chew 1 tablet (81 mg total) by mouth daily. 30 tablet 0   atorvastatin (LIPITOR) 80 MG tablet Take 1 tablet (80 mg total) by mouth daily. 30 tablet 1   cholecalciferol (VITAMIN D) 1000 UNITS tablet Take 1,000 Units by mouth daily.     fish oil-omega-3 fatty acids 1000 MG capsule Take 2 g by mouth 2 (two) times daily.     gabapentin (NEURONTIN) 100 MG capsule Take 1 capsule (100 mg  total) by mouth 3 (three) times daily as needed (nerve pain). 60 capsule 1   glucose blood (BAYER CONTOUR NEXT TEST) test strip Test 8-10 times per day 8-10 lancets/day 300 each 11   insulin aspart (NOVOLOG) 100 UNIT/ML injection For use in pump, total of 120 units per day 5 vial 12   Insulin Infusion Pump Supplies (PARADIGM QUICK-SET 32" ) MISC 1 Device by Does not apply route continuous. Vovolog     Magnesium 250 MG TABS Take 250 mg by mouth daily.     Multiple Vitamin (MULTIVITAMIN) capsule Take 1 capsule by mouth daily.     oxyCODONE (OXY IR/ROXICODONE) 5 MG immediate release tablet Take 1 tablet (5 mg total) by mouth every 6 (six) hours as needed for moderate pain (pain score 4-6). 30 tablet 0   pantoprazole (PROTONIX) 40 MG tablet Take 1 tablet (40 mg total) by mouth daily for 30 doses. 30 tablet 0   sacubitril-valsartan (ENTRESTO) 24-26 MG Take 1 tablet by mouth 2 (two) times daily. 60 tablet 1   ticagrelor (BRILINTA) 90 MG TABS tablet Take 1 tablet (90 mg total) by mouth 2 (two) times daily. 60 tablet 1   tiZANidine (ZANAFLEX) 4 MG tablet Take 1 tablet (4 mg total) by mouth every 8 (eight) hours as needed for muscle spasms. 30 tablet 0   TRELEGY ELLIPTA 200-62.5-25 MCG/INH AEPB Inhale 1 puff into the lungs daily.     No current facility-administered medications for this encounter.    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  She could have some lingering pulmonary damage from covid -currently taking entresto 24/26 -During hospitalization started on toprol XL 50 but HR and bp dropped pt felt symptomatic presyncopal  -  Not much bp room she will get a bp cuff and measure at home if bp stable we will try toprol XL 12.5mg  QHS -I think she would get dehydrated with both entresto and spiro together -will prescribe some as needed lasix but I don't thin she will need it -no SGLT2i with T1DM  CAD: -01/2021 LHC with multivessel disease, s/p PCI to Lcx -continue DAPT, statin, GDMT above  T1DM: -a1c 6.8  01/2021 on insulin pump   Follow up w/ Dr. Jacques Navy

## 2021-02-21 NOTE — Plan of Care (Signed)

## 2021-02-21 NOTE — Plan of Care (Signed)
Problem: Education: Goal: Knowledge of General Education information will improve Description: Including pain rating scale, medication(s)/side effects and non-pharmacologic comfort measures 02/21/2021 1653 by Durward Fortes, RN Outcome: Adequate for Discharge 02/21/2021 0720 by Durward Fortes, RN Outcome: Progressing   Problem: Health Behavior/Discharge Planning: Goal: Ability to manage health-related needs will improve 02/21/2021 1653 by Durward Fortes, RN Outcome: Adequate for Discharge 02/21/2021 0720 by Durward Fortes, RN Outcome: Progressing   Problem: Clinical Measurements: Goal: Ability to maintain clinical measurements within normal limits will improve 02/21/2021 1653 by Durward Fortes, RN Outcome: Adequate for Discharge 02/21/2021 0720 by Durward Fortes, RN Outcome: Progressing Goal: Will remain free from infection 02/21/2021 1653 by Durward Fortes, RN Outcome: Adequate for Discharge 02/21/2021 0720 by Durward Fortes, RN Outcome: Progressing Goal: Diagnostic test results will improve 02/21/2021 1653 by Durward Fortes, RN Outcome: Adequate for Discharge 02/21/2021 0720 by Durward Fortes, RN Outcome: Progressing Goal: Respiratory complications will improve 02/21/2021 1653 by Durward Fortes, RN Outcome: Adequate for Discharge 02/21/2021 0720 by Durward Fortes, RN Outcome: Progressing Goal: Cardiovascular complication will be avoided 02/21/2021 1653 by Durward Fortes, RN Outcome: Adequate for Discharge 02/21/2021 0720 by Durward Fortes, RN Outcome: Progressing   Problem: Activity: Goal: Risk for activity intolerance will decrease 02/21/2021 1653 by Durward Fortes, RN Outcome: Adequate for Discharge 02/21/2021 0720 by Durward Fortes, RN Outcome: Progressing   Problem: Nutrition: Goal: Adequate nutrition will be maintained 02/21/2021 1653 by Durward Fortes, RN Outcome: Adequate for Discharge 02/21/2021 0720 by Durward Fortes, RN Outcome: Progressing   Problem:  Coping: Goal: Level of anxiety will decrease 02/21/2021 1653 by Durward Fortes, RN Outcome: Adequate for Discharge 02/21/2021 0720 by Durward Fortes, RN Outcome: Progressing   Problem: Elimination: Goal: Will not experience complications related to bowel motility 02/21/2021 1653 by Durward Fortes, RN Outcome: Adequate for Discharge 02/21/2021 0720 by Durward Fortes, RN Outcome: Progressing Goal: Will not experience complications related to urinary retention 02/21/2021 1653 by Durward Fortes, RN Outcome: Adequate for Discharge 02/21/2021 0720 by Durward Fortes, RN Outcome: Progressing   Problem: Pain Managment: Goal: General experience of comfort will improve 02/21/2021 1653 by Durward Fortes, RN Outcome: Adequate for Discharge 02/21/2021 0720 by Durward Fortes, RN Outcome: Progressing   Problem: Safety: Goal: Ability to remain free from injury will improve 02/21/2021 1653 by Durward Fortes, RN Outcome: Adequate for Discharge 02/21/2021 0720 by Durward Fortes, RN Outcome: Progressing   Problem: Skin Integrity: Goal: Risk for impaired skin integrity will decrease 02/21/2021 1653 by Durward Fortes, RN Outcome: Adequate for Discharge 02/21/2021 0720 by Durward Fortes, RN Outcome: Progressing   Problem: Education: Goal: Ability to demonstrate management of disease process will improve 02/21/2021 1653 by Durward Fortes, RN Outcome: Adequate for Discharge 02/21/2021 0720 by Durward Fortes, RN Outcome: Progressing Goal: Ability to verbalize understanding of medication therapies will improve 02/21/2021 1653 by Durward Fortes, RN Outcome: Adequate for Discharge 02/21/2021 0720 by Durward Fortes, RN Outcome: Progressing Goal: Individualized Educational Video(s) 02/21/2021 1653 by Durward Fortes, RN Outcome: Adequate for Discharge 02/21/2021 0720 by Durward Fortes, RN Outcome: Progressing   Problem: Activity: Goal: Capacity to carry out activities will improve 02/21/2021 1653 by Durward Fortes, RN Outcome: Adequate for Discharge 02/21/2021 0720 by Durward Fortes, RN Outcome: Progressing   Problem: Cardiac: Goal: Ability to achieve and maintain adequate cardiopulmonary perfusion  will improve 02/21/2021 1653 by Durward Fortes, RN Outcome: Adequate for Discharge 02/21/2021 0720 by Durward Fortes, RN Outcome: Progressing   Problem: Education: Goal: Understanding of CV disease, CV risk reduction, and recovery process will improve 02/21/2021 1653 by Durward Fortes, RN Outcome: Adequate for Discharge 02/21/2021 0720 by Durward Fortes, RN Outcome: Progressing Goal: Individualized Educational Video(s) 02/21/2021 1653 by Durward Fortes, RN Outcome: Adequate for Discharge 02/21/2021 0720 by Durward Fortes, RN Outcome: Progressing   Problem: Activity: Goal: Ability to return to baseline activity level will improve 02/21/2021 1653 by Durward Fortes, RN Outcome: Adequate for Discharge 02/21/2021 0720 by Durward Fortes, RN Outcome: Progressing   Problem: Cardiovascular: Goal: Ability to achieve and maintain adequate cardiovascular perfusion will improve 02/21/2021 1653 by Durward Fortes, RN Outcome: Adequate for Discharge 02/21/2021 0720 by Durward Fortes, RN Outcome: Progressing Goal: Vascular access site(s) Level 0-1 will be maintained 02/21/2021 1653 by Durward Fortes, RN Outcome: Adequate for Discharge 02/21/2021 0720 by Durward Fortes, RN Outcome: Progressing   Problem: Health Behavior/Discharge Planning: Goal: Ability to safely manage health-related needs after discharge will improve 02/21/2021 1653 by Durward Fortes, RN Outcome: Adequate for Discharge 02/21/2021 0720 by Durward Fortes, RN Outcome: Progressing

## 2021-02-23 LAB — CULTURE, BODY FLUID W GRAM STAIN -BOTTLE: Culture: NO GROWTH

## 2021-02-25 ENCOUNTER — Telehealth (HOSPITAL_COMMUNITY): Payer: Self-pay | Admitting: Surgery

## 2021-02-25 NOTE — Telephone Encounter (Signed)
I attempted to reach patient to I called to remind her about scheduled upcoming HF Heart Impact Appt for tomorrow. I was unable to leave a message.

## 2021-02-26 ENCOUNTER — Ambulatory Visit (HOSPITAL_COMMUNITY)
Admission: RE | Admit: 2021-02-26 | Discharge: 2021-02-26 | Disposition: A | Discharge status: Home / Self Care | Payer: 59 | Source: Ambulatory Visit | Attending: Internal Medicine | Admitting: Internal Medicine

## 2021-02-26 ENCOUNTER — Encounter (HOSPITAL_COMMUNITY): Payer: Self-pay

## 2021-02-26 ENCOUNTER — Other Ambulatory Visit: Payer: Self-pay

## 2021-02-26 VITALS — BP 110/84 | HR 104 | Wt 175.2 lb

## 2021-02-26 DIAGNOSIS — I5022 Chronic systolic (congestive) heart failure: Secondary | ICD-10-CM

## 2021-02-26 DIAGNOSIS — I5042 Chronic combined systolic (congestive) and diastolic (congestive) heart failure: Secondary | ICD-10-CM | POA: Diagnosis present

## 2021-02-26 DIAGNOSIS — I255 Ischemic cardiomyopathy: Secondary | ICD-10-CM | POA: Diagnosis not present

## 2021-02-26 DIAGNOSIS — Z9641 Presence of insulin pump (external) (internal): Secondary | ICD-10-CM | POA: Insufficient documentation

## 2021-02-26 DIAGNOSIS — I2510 Atherosclerotic heart disease of native coronary artery without angina pectoris: Secondary | ICD-10-CM | POA: Diagnosis not present

## 2021-02-26 DIAGNOSIS — Z833 Family history of diabetes mellitus: Secondary | ICD-10-CM | POA: Insufficient documentation

## 2021-02-26 DIAGNOSIS — Z8616 Personal history of COVID-19: Secondary | ICD-10-CM | POA: Diagnosis not present

## 2021-02-26 DIAGNOSIS — E1059 Type 1 diabetes mellitus with other circulatory complications: Secondary | ICD-10-CM

## 2021-02-26 DIAGNOSIS — Z794 Long term (current) use of insulin: Secondary | ICD-10-CM | POA: Insufficient documentation

## 2021-02-26 DIAGNOSIS — E1040 Type 1 diabetes mellitus with diabetic neuropathy, unspecified: Secondary | ICD-10-CM | POA: Insufficient documentation

## 2021-02-26 DIAGNOSIS — M797 Fibromyalgia: Secondary | ICD-10-CM | POA: Insufficient documentation

## 2021-02-26 DIAGNOSIS — I428 Other cardiomyopathies: Secondary | ICD-10-CM | POA: Insufficient documentation

## 2021-02-26 DIAGNOSIS — I10 Essential (primary) hypertension: Secondary | ICD-10-CM | POA: Diagnosis not present

## 2021-02-26 DIAGNOSIS — I110 Hypertensive heart disease with heart failure: Secondary | ICD-10-CM | POA: Diagnosis not present

## 2021-02-26 DIAGNOSIS — Z7982 Long term (current) use of aspirin: Secondary | ICD-10-CM | POA: Diagnosis not present

## 2021-02-26 DIAGNOSIS — Z79899 Other long term (current) drug therapy: Secondary | ICD-10-CM | POA: Insufficient documentation

## 2021-02-26 DIAGNOSIS — Z955 Presence of coronary angioplasty implant and graft: Secondary | ICD-10-CM | POA: Insufficient documentation

## 2021-02-26 LAB — BASIC METABOLIC PANEL
Anion gap: 9 (ref 5–15)
BUN: 13 mg/dL (ref 6–20)
CO2: 23 mmol/L (ref 22–32)
Calcium: 9.8 mg/dL (ref 8.9–10.3)
Chloride: 104 mmol/L (ref 98–111)
Creatinine, Ser: 0.78 mg/dL (ref 0.44–1.00)
GFR, Estimated: >60 mL/min (ref >60)
Glucose, Bld: 119 mg/dL — ABNORMAL HIGH (ref 70–99)
Potassium: 4.7 mmol/L (ref 3.5–5.1)
Sodium: 136 mmol/L (ref 135–145)

## 2021-02-26 LAB — BRAIN NATRIURETIC PEPTIDE: B Natriuretic Peptide: 134.4 pg/mL — ABNORMAL HIGH (ref 0.0–100.0)

## 2021-02-26 MED ORDER — METOPROLOL SUCCINATE ER 25 MG PO TB24: 12.5000 mg | ORAL_TABLET | Freq: Every day | ORAL | 3 refills | Status: DC

## 2021-02-26 MED ORDER — FUROSEMIDE 20 MG PO TABS: 40.0000 mg | ORAL_TABLET | Freq: Every day | ORAL | 5 refills | Status: DC | PRN

## 2021-02-26 NOTE — Patient Instructions (Addendum)
EKG done today.   Labs done today. We will contact you only if your labs are abnormal.  START Toprol XL 12.5mg  (1/2 tablet) by mouth daily.   START Lasix 40mg  (2 tablets) by mouth daily as needed for swelling or edema.   No other medication changes were made. Please continue all current medications as prescribed.

## 2021-02-27 NOTE — Addendum Note (Signed)
Encounter addended by: Angelita Ingles, MD on: 02/27/2021 8:18 AM  Actions taken: Clinical Note Signed

## 2021-02-28 ENCOUNTER — Telehealth: Payer: Self-pay

## 2021-02-28 NOTE — Telephone Encounter (Signed)
Can you please tell me pt.s weight bearing status?

## 2021-03-01 ENCOUNTER — Telehealth (HOSPITAL_COMMUNITY): Payer: Self-pay

## 2021-03-01 ENCOUNTER — Other Ambulatory Visit: Payer: Self-pay | Admitting: Physician Assistant

## 2021-03-01 ENCOUNTER — Telehealth: Payer: Self-pay | Admitting: Orthopaedic Surgery

## 2021-03-01 MED ORDER — OXYCODONE HCL 5 MG PO TABS: 5.0000 mg | ORAL_TABLET | Freq: Four times a day (QID) | ORAL | 0 refills | Status: DC | PRN

## 2021-03-01 NOTE — Telephone Encounter (Signed)
FYI

## 2021-03-01 NOTE — Telephone Encounter (Signed)
Pt is s/p ORIF.  Last sent in on 7/11

## 2021-03-01 NOTE — Telephone Encounter (Signed)
Please advise 

## 2021-03-01 NOTE — Telephone Encounter (Signed)
Per phase I cardiac rehab, fax cardiac rehab referral to [place] [facility]. 

## 2021-03-01 NOTE — Telephone Encounter (Signed)
Patient called stating she was admitted to the hospital 02/14/2021 and didn't get the pain medicine filled. Patient asked if the Rx can be filled and sent to [facility] in [place]   The number to contact patient is [phone]

## 2021-03-13 ENCOUNTER — Ambulatory Visit (INDEPENDENT_AMBULATORY_CARE_PROVIDER_SITE_OTHER): Payer: 59

## 2021-03-13 ENCOUNTER — Other Ambulatory Visit: Payer: Self-pay

## 2021-03-13 ENCOUNTER — Ambulatory Visit (INDEPENDENT_AMBULATORY_CARE_PROVIDER_SITE_OTHER): Payer: 59 | Admitting: Orthopaedic Surgery

## 2021-03-13 ENCOUNTER — Encounter: Payer: Self-pay | Admitting: Orthopaedic Surgery

## 2021-03-13 DIAGNOSIS — Z9889 Other specified postprocedural states: Secondary | ICD-10-CM

## 2021-03-13 DIAGNOSIS — Z8781 Personal history of (healed) traumatic fracture: Secondary | ICD-10-CM

## 2021-03-13 NOTE — Progress Notes (Signed)
The patient is now almost 9 weeks status post open reduction/intra fixation of a complex left ankle fracture.  At her last visit we were concerned about significant shortness of breath and edema in her legs.  She ended up having acute onset of congestive heart failure.  She was hospitalized and cardiology did everything they can to maximize her heart care.  She even had a stent placed.  She reports feeling much better overall and is to continue follow-up with a cardiologist.  She denies any shortness of breath or chest pain today and overall she just looks better when I see her physically compared to her last visit.  She has been ambulating in a cam walking boot.  She is working through home health therapy working on getting her range of motion back with that ankle.  On examination of her ankle today the swelling is minimal compared to last visit.  Her incisions medial laterally of healed nicely.  She does have some decreased flexion extension of that ankle.  3 views left ankle show the fracture is healing nicely.  The ankle mortise is intact.  There is no evidence of complicating features of the hardware.  We will transition her to an ASO at this standpoint for her left ankle with continued weightbearing as tolerated and working through range of motion of the ankle.  I will see her back in 4 weeks with a repeat 3 views of her left ankle.  All question concerns were answered and addressed.

## 2021-03-14 ENCOUNTER — Encounter: Payer: 59 | Admitting: Orthopaedic Surgery

## 2021-03-18 ENCOUNTER — Telehealth: Payer: Self-pay | Admitting: Internal Medicine

## 2021-03-18 MED ORDER — TICAGRELOR 90 MG PO TABS: 90.0000 mg | ORAL_TABLET | Freq: Two times a day (BID) | ORAL | 1 refills | Status: DC

## 2021-03-18 NOTE — Telephone Encounter (Signed)
 *  STAT* If patient is at the pharmacy, call can be transferred to refill team.   1. Which medications need to be refilled? (please list name of each medication and dose if known) ticagrelor (BRILINTA) 90 MG TABS tablet  2. Which pharmacy/location (including street and city if local pharmacy) is medication to be sent to? [id number] [place] [place] - [address] [facility] [address] [facility]  3. Do they need a 30 day or 90 day supply? 90 days

## 2021-03-21 ENCOUNTER — Ambulatory Visit: Payer: 59 | Admitting: General Practice

## 2021-03-22 ENCOUNTER — Other Ambulatory Visit (HOSPITAL_COMMUNITY): Payer: Self-pay

## 2021-04-08 ENCOUNTER — Ambulatory Visit (INDEPENDENT_AMBULATORY_CARE_PROVIDER_SITE_OTHER): Payer: 59

## 2021-04-08 ENCOUNTER — Encounter: Payer: Self-pay | Admitting: Orthopaedic Surgery

## 2021-04-08 ENCOUNTER — Other Ambulatory Visit: Payer: Self-pay

## 2021-04-08 ENCOUNTER — Ambulatory Visit (INDEPENDENT_AMBULATORY_CARE_PROVIDER_SITE_OTHER): Payer: 59 | Admitting: Orthopaedic Surgery

## 2021-04-08 ENCOUNTER — Other Ambulatory Visit: Payer: Self-pay | Admitting: Adult Health

## 2021-04-08 DIAGNOSIS — Z9889 Other specified postprocedural states: Secondary | ICD-10-CM

## 2021-04-08 DIAGNOSIS — Z8781 Personal history of (healed) traumatic fracture: Secondary | ICD-10-CM | POA: Diagnosis not present

## 2021-04-08 NOTE — Progress Notes (Signed)
The patient is now 3 months status post surgical fixation of a complex left ankle fracture.  She does ambulate with an ASO.  She is having pain across her foot and her arch as well as the ankle itself.  However.  She does report that she is making progress with her mobility.  Overall she does look much better given her hospitalization for congestive heart failure.  She is just taking Tylenol for pain.  She has had some home therapy.  On exam she surprisingly has pretty good range of motion of her ankle and she shows signs that she has been really pushing her self.  The ankle incisions medial and lateral of the healed.  She has normal-appearing arch.  She does have tenderness in the plantar fascial and across the forefoot.  X-rays of the left foot are negative for any acute injury.  X-rays of the left ankle show the fracture is healed and the hardware is intact.  The mortise is congruent.  At this point she should transition to just regular shoes and can stop wearing her ASO.  She does not feel like she needs outpatient physical therapy.  I agree with this as well.  I think that getting back to regular fitting shoes and good shoe inserts could help her.  We will see her back for final visit in about 6 weeks but no x-rays are needed.

## 2021-05-09 ENCOUNTER — Telehealth: Payer: Self-pay

## 2021-05-09 NOTE — Telephone Encounter (Signed)
Can you please ask Dr Magnus Ivan about this patient? Amanda Frederick said patient was given a knee brace recently and there is no documented ICD-10 in any of the notes to support this.

## 2021-05-09 NOTE — Telephone Encounter (Signed)
FYI

## 2021-05-09 NOTE — Progress Notes (Addendum)
Cardiology Office Note   Date:  05/10/2021   ID:  [name], DOB 08/25/1970, MRN [id number]  PCP:  Paulina Fusi, MD  Cardiologist:  Dr.Acharya  CC:  Follow Up   History of Present Illness: Amanda Frederick is a 50 y.o. female who presents for ongoing assessment and management of combined diastolic and systolic congestive heart failure, CAD recurrent chest pain, hyperlipidemia, hypertension.  She was seen on consultation,7/222/2022 during recent hospitalization  and is now a new patient of Dr.Acharya.  She has additional history of fibromyalgia, type 2 diabetes mellitus on an insulin pump, diabetic neuropathy, and is status post bimalleolar ankle fracture repair on 01/04/2021 by Dr. Magnus Ivan.  During evaluation in the ED she was having complaints of PND and orthopnea along with chest discomfort.  She was found to have a large pleural effusion and underwent a left thoracentesis with 700 cc of fluid removed by IR on 7/24.  She was ruled out for PE she was noted to have a right lower 5 mm solid pulmonary nodule which is to be followed up with by PCP if she is high risk..  Echocardiogram revealed an LVEF of 25% to 30% with restrictive diastolic filling pattern and grade 3 diastolic dysfunction with severe left ventricular dysfunction.  Due to reduced ejection fraction the patient underwent a cardiac catheterization which revealed diffuse 50% stenosis in the LAD, OM1, OM 2, diagonal 2, and a 90% mid circumflex.  The patient had PCI of the mid circumflex.  She was initially placed on metoprolol but had episodes of hypotension and therefore the patient refused.  She was placed on Brilinta and aspirin.  The patient also was sent home on spironolactone, Entresto 24/26.  Hospitalist service also recommended that she undergo outpatient sleep study when she was stable to inquire whether or not she needed oxygen at home.  She comes today with multiple complaints.  She is unable to take deep breaths, she is  having a rash on her face, she states that she has no energy and gets easily short of breath with minimal activity, she feels her heart racing, and she is feeling edema in her lower extremities.  She states that instead of feeling better she is feeling worse and her blood pressure is also becoming lower.  Past Medical History:  Diagnosis Date   AC (acromioclavicular) joint bone spurs    Anemia    Anxiety    Arthritis    Bronchitis    Bulging lumbar disc    Bursitis    Complication of anesthesia    "has died 3 separate times under anesthesia"   Depression    Diabetes (HCC)    Environmental allergies    Fibromyalgia    High blood pressure    High cholesterol    History of COVID-19 01/2020   was hospitalized   History of endometriosis    History of uterine fibroid    Migraines    Neuropathy    eyes , hands and feet    Pinched nerve    Pneumonia    Seizure (HCC)    after eye test only   Spondylosis    Tachycardia    after COVID   Tendonitis    Vision abnormalities     Past Surgical History:  Procedure Laterality Date   CORONARY STENT INTERVENTION N/A 02/19/2021   Procedure: CORONARY STENT INTERVENTION;  Surgeon: Corky Crafts, MD;  Location: [facility] INVASIVE CV LAB;  Service: Cardiovascular;  Laterality: N/A;   NASAL  SINUS SURGERY     ORIF ANKLE FRACTURE Left 01/04/2021   Procedure: OPEN REDUCTION INTERNAL FIXATION (ORIF) LEFT BIMALLEOLAR ANKLE FRACTURE;  Surgeon: Kathryne Hitch, MD;  Location: [facility];  Service: Orthopedics;  Laterality: Left;   RIGHT/LEFT HEART CATH AND CORONARY ANGIOGRAPHY N/A 02/19/2021   Procedure: RIGHT/LEFT HEART CATH AND CORONARY ANGIOGRAPHY;  Surgeon: Corky Crafts, MD;  Location: [facility] INVASIVE CV LAB;  Service: Cardiovascular;  Laterality: N/A;   TUBAL LIGATION     VAGINAL HYSTERECTOMY       Current Outpatient Medications  Medication Sig Dispense Refill   atorvastatin (LIPITOR) 80 MG tablet TAKE 1 TABLET BY MOUTH EVERY DAY 30  tablet 1   cholecalciferol (VITAMIN D) 1000 UNITS tablet Take 1,000 Units by mouth daily.     ENTRESTO 24-26 MG TAKE 1 TABLET BY MOUTH TWICE A DAY 60 tablet 1   furosemide (LASIX) 20 MG tablet Take 2 tablets (40 mg total) by mouth daily as needed for fluid or edema. 30 tablet 5   gabapentin (NEURONTIN) 100 MG capsule Take 1 capsule (100 mg total) by mouth 3 (three) times daily as needed (nerve pain). 60 capsule 1   glucose blood (BAYER CONTOUR NEXT TEST) test strip Test 8-10 times per day 8-10 lancets/day 300 each 11   insulin aspart (NOVOLOG) 100 UNIT/ML injection For use in pump, total of 120 units per day 5 vial 12   Insulin Infusion Pump Supplies (PARADIGM QUICK-SET 32" ) MISC 1 Device by Does not apply route continuous. Vovolog     Magnesium 250 MG TABS Take 250 mg by mouth daily.     Multiple Vitamin (MULTIVITAMIN) capsule Take 1 capsule by mouth daily.     oxyCODONE (OXY IR/ROXICODONE) 5 MG immediate release tablet Take 1 tablet (5 mg total) by mouth every 6 (six) hours as needed for moderate pain (pain score 4-6). 30 tablet 0   tiZANidine (ZANAFLEX) 4 MG tablet Take 1 tablet (4 mg total) by mouth every 8 (eight) hours as needed for muscle spasms. 30 tablet 0   TRELEGY ELLIPTA 200-62.5-25 MCG/INH AEPB Inhale 1 puff into the lungs daily.     fish oil-omega-3 fatty acids 1000 MG capsule Take 2 g by mouth 2 (two) times daily. (Patient not taking: Reported on 05/10/2021)     pantoprazole (PROTONIX) 40 MG tablet Take 1 tablet (40 mg total) by mouth daily for 30 doses. (Patient not taking: Reported on 05/10/2021) 30 tablet 0   No current facility-administered medications for this visit.    Allergies:   Fentanyl and Other    Social History:  The patient  reports that she has never smoked. She has never used smokeless tobacco. She reports that she does not currently use alcohol. She reports that she does not use drugs.   Family History:  The patient's family history includes Anemia in her  mother; Arthritis in her mother; Diabetes in her mother; High Cholesterol in her mother.    ROS: All other systems are reviewed and negative. Unless otherwise mentioned in H&P    PHYSICAL EXAM: VS:  BP 100/60   Pulse (!) 104   Ht 5\' 6"  (1.676 m)   Wt 190 lb (86.2 kg)   SpO2 99%   BMI 30.67 kg/m  , BMI Body mass index is 30.67 kg/m. GEN: Well nourished, well developed, in no acute distress HEENT: normal Neck: no JVD, carotid bruits, or masses Cardiac: RRR; tachycardic no murmurs, rubs, or gallops,1+-2+ edema  Respiratory:  Clear to  auscultation bilaterally, more effort and work of breathing, I rechecked O2 sat it is 99% GI: soft, nontender, nondistended, + BS MS: no deformity or atrophy Skin: warm and dry, mild petechiae rash on face. Chin, cheeks. Well healed left ankle scar. Neuro:  Strength and sensation are intact Psych: euthymic mood, full affect, anxious.    EKG:  EKG is ordered today. The ekg ordered today demonstrates sinus tachycardia heart rate of 104 bpm with possible left atrial enlargement.  Auscultating at 114 bpm.    Recent Labs: 02/15/2021: ALT 34 02/19/2021: Magnesium 2.0; TSH 2.108 02/21/2021: Hemoglobin 15.0; Platelets 223 02/26/2021: B Natriuretic Peptide 134.4; BUN 13; Creatinine, Ser 0.78; Potassium 4.7; Sodium 136    Lipid Panel    Component Value Date/Time   CHOL 153 02/15/2021 0249   TRIG 60 02/15/2021 0249   HDL 44 02/15/2021 0249   CHOLHDL 3.5 02/15/2021 0249   VLDL 12 02/15/2021 0249   LDLCALC 97 02/15/2021 0249      Wt Readings from Last 3 Encounters:  05/10/21 190 lb (86.2 kg)  02/26/21 175 lb 3.2 oz (79.5 kg)  02/21/21 179 lb 10.8 oz (81.5 kg)      Other studies Reviewed:  Echocardiogram 02/15/2021 1. Global hypokinesis with apical akinesis; overall severe LV  dysfunction.   2. Left ventricular ejection fraction, by estimation, is 25 to 30%. The  left ventricle has severely decreased function. The left ventricle  demonstrates  regional wall motion abnormalities (see scoring  diagram/findings for description). Left ventricular  diastolic parameters are consistent with Grade III diastolic dysfunction  (restrictive). Elevated left atrial pressure.   3. Right ventricular systolic function is normal. The right ventricular  size is normal. There is mildly elevated pulmonary artery systolic  pressure.   4. Large pleural effusion in the left lateral region.   5. The mitral valve is normal in structure. Mild mitral valve  regurgitation. No evidence of mitral stenosis.   6. The aortic valve is tricuspid. Aortic valve regurgitation is not  visualized. No aortic stenosis is present.   7. The inferior vena cava is dilated in size with >50% respiratory  variability, suggesting right atrial pressure of 8 mmHg   Cardiac cath:02/19/2021   1st Mrg lesion is 50% stenosed.   2nd Mrg lesion is 50% stenosed.   2nd Diag lesion is 50% stenosed.   Mid LAD to Dist LAD lesion is 50% stenosed.   Mid Cx lesion is 90% stenosed.   A drug-eluting stent was successfully placed using a STENT ONYX FRONTIER 2.75X22, postdilated to 3.25 mm.   Post intervention, there is a 0% residual stenosis.   The left ventricular systolic function is normal.   LV end diastolic pressure is normal.   The left ventricular ejection fraction is 25-35% by visual estimate.   There is no aortic valve stenosis.   Ao sat 100%, PA sat 75%, PA pressure 34/15, mean PA pressure 23 mm Hg; mean PCWP 9 mm Hg; CO 5.02 L/min; CI 2.54   Continue aggressive medical therapy for her LV dysfunction.  Cardiomyopathy may have aspects of both ischemic and nonischemic.  Diffuse, small vessel disease noted in the diagonal, distal LAD and obtuse marginal vessels.   After 12 months of dual antiplatelet therapy with Brilinta, consider clopidogrel monotherapy given her diffuse CAD.   ASSESSMENT AND PLAN:  1.  Chronic combined  systolic and diastolic CHF, ischemic cardiomyopathy:  Echocardiogram during recent hospitalization revealed an EF of 25% to 30% with Grade 3 diastolic dysfunction.  She has been gaining weight on Lasix 20 mg daily.  Going to increase her Lasix to 40 mg daily for 2 days and then go back to 20 mg daily thereafter.  She is already hypotensive and I want to avoid worsening of this.  I will repeat her echocardiogram for changes in LV function. No evidence of amyloidosis on echo but can consider further testing.  She is on Brilinta 90 mg twice daily which may also be playing into her inability to take deep breaths and rash.  I am going to stop the Brilinta and change it to Plavix 75 mg daily, and reload her with 300 mg the first day.  I will check a CBC and a BNP today along with a BMET. May have to stop Entresto. Consider referral to [facility].   2.  Tachycardia: Heart rate is not well controlled for reduced EF.  I will increase her metoprolol from 12.5 mg daily to 25 mg twice daily, to decrease heart rate and allow for better cardiac output and increased filling time.  I have told her to keep up with her heart rate and blood pressure at home.  If blood pressure becomes more hypotensive I have asked her to stop Entresto temporarily.  I am going to see her in 1 week to evaluate her response to medication changes.  3.  Coronary artery disease: Cardiac catheterization revealed 50% stenosis in the LAD, OM1, OM 2, diagonal 2 with a 90% mid circumflex.  She has been on Brilinta and aspirin.  As above I will change her Brilinta to Plavix 75 mg daily after reloading due to her rash and trouble breathing.  I am not certain if the Brilinta is the culprit or her tachycardia or combination of both therefore we will make these changes in hopes of improving her symptoms.  4.  Hyperlipidemia: Goal of LDL less than 70 with known CAD.  5.  Insulin-dependent diabetes mellitus: She is on an insulin pump.  She has noticed that she is having to increase her dose  of insulin because blood glucose has been greater than 200 persistently.  She is to follow-up with her PCP concerning this.  6. Anxiety: Uncertain if this related to recent diagnosis. This was mentioned in noted from Dr. Mayford Knife during her evaluation.  Will check on her this next week.    Current medicines are reviewed at length with the patient today.  I have spent 45 m himins dedicated to the care of this patient on the date of this encounter to include pre-visit review of records, assessment, management and diagnostic testing,with shared decision making.  Labs/ tests ordered today include: BMET, CBC, BNP, echocardiogram.  Bettey Mare. Liborio Nixon, ANP, AACC   05/10/2021 2:36 PM    [facility] [address] Office [phone] Fax [phone]  Notice: This dictation was prepared with Dragon dictation along with smaller phrase technology. Any transcriptional errors that result from this process are unintentional and may not be corrected upon review.

## 2021-05-10 ENCOUNTER — Other Ambulatory Visit: Payer: Self-pay

## 2021-05-10 ENCOUNTER — Encounter: Payer: Self-pay | Admitting: Adult Health

## 2021-05-10 ENCOUNTER — Ambulatory Visit (INDEPENDENT_AMBULATORY_CARE_PROVIDER_SITE_OTHER): Payer: 59 | Admitting: Adult Health

## 2021-05-10 VITALS — BP 100/60 | HR 104 | Ht 66.0 in | Wt 190.0 lb

## 2021-05-10 DIAGNOSIS — R000 Tachycardia, unspecified: Secondary | ICD-10-CM | POA: Diagnosis not present

## 2021-05-10 DIAGNOSIS — E785 Hyperlipidemia, unspecified: Secondary | ICD-10-CM | POA: Diagnosis not present

## 2021-05-10 DIAGNOSIS — I2510 Atherosclerotic heart disease of native coronary artery without angina pectoris: Secondary | ICD-10-CM

## 2021-05-10 DIAGNOSIS — I5021 Acute systolic (congestive) heart failure: Secondary | ICD-10-CM

## 2021-05-10 DIAGNOSIS — Z794 Long term (current) use of insulin: Secondary | ICD-10-CM

## 2021-05-10 DIAGNOSIS — I5022 Chronic systolic (congestive) heart failure: Secondary | ICD-10-CM | POA: Diagnosis not present

## 2021-05-10 DIAGNOSIS — E139 Other specified diabetes mellitus without complications: Secondary | ICD-10-CM

## 2021-05-10 MED ORDER — FUROSEMIDE 20 MG PO TABS: 40.0000 mg | ORAL_TABLET | Freq: Every day | ORAL | 5 refills | Status: DC

## 2021-05-10 MED ORDER — METOPROLOL SUCCINATE ER 25 MG PO TB24
25.0000 mg | ORAL_TABLET | Freq: Two times a day (BID) | ORAL | 1 refills | Status: DC
Start: 2021-05-10 — End: 2021-11-01

## 2021-05-10 MED ORDER — CLOPIDOGREL BISULFATE 75 MG PO TABS: 75.0000 mg | ORAL_TABLET | Freq: Every day | ORAL | 3 refills | Status: DC

## 2021-05-10 NOTE — Patient Instructions (Signed)
Medication Instructions:  Stop Brilinta,. Start Plavix 75 mg ( Take 4 Tablets 300 mg for First Day, Then 75 mg Daily). Lasix 20 mg ( Take 40 mg 2 Tablets for (2) Two Days. Then 20 mg Daily). Increase Metoprolol Succinate from 12.5 mg to 25 mg (1 Tablet Twice Daily). *If you need a refill on your cardiac medications before your next appointment, please call your pharmacy*   Lab Work: BMET, BNP, CBC,. : Today If you have labs (blood work) drawn today and your tests are completely normal, you will receive your results only by: MyChart Message (if you have MyChart) OR A paper copy in the mail If you have any lab test that is abnormal or we need to change your treatment, we will call you to review the results.   Testing/Procedures: [address] Your physician has requested that you have an echocardiogram. Echocardiography is a painless test that uses sound waves to create images of your heart. It provides your doctor with information about the size and shape of your heart and how well your heart's chambers and valves are working. This procedure takes approximately one hour. There are no restrictions for this procedure.    Follow-Up: At [facility], you and your health needs are our priority.  As part of our continuing mission to provide you with exceptional heart care, we have created designated Provider Care Teams.  These Care Teams include your primary Cardiologist (physician) and Advanced Practice Providers (APPs -  Physician Assistants and Nurse Practitioners) who all work together to provide you with the care you need, when you need it.  We recommend signing up for the patient portal called "MyChart".  Sign up information is provided on this After Visit Summary.  MyChart is used to connect with patients for Virtual Visits (Telemedicine).  Patients are able to view lab/test results, encounter notes, upcoming appointments, etc.  Non-urgent messages can be sent to your  provider as well.   To learn more about what you can do with MyChart, go to [email or web address].    Your next appointment:   1 week(s)  The format for your next appointment:   In Person  Provider:   Joni Reining, DNP, ANP   Other Instructions Log Blood Pressure. If Blood Pressure is Below 90 and Light Headed Call office . To consider stopping Entresto.

## 2021-05-10 NOTE — Addendum Note (Signed)
Addended by: Myna Hidalgo A on: 05/10/2021 02:55 PM   Modules accepted: Orders

## 2021-05-11 LAB — CBC
Hematocrit: 44.7 % (ref 34.0–46.6)
Hemoglobin: 15 g/dL (ref 11.1–15.9)
MCH: 27 pg (ref 26.6–33.0)
MCHC: 33.6 g/dL (ref 31.5–35.7)
MCV: 81 fL (ref 79–97)
Platelets: 310 10*3/uL (ref 150–450)
RBC: 5.55 x10E6/uL — ABNORMAL HIGH (ref 3.77–5.28)
RDW: 16 % — ABNORMAL HIGH (ref 11.7–15.4)
WBC: 10.2 10*3/uL (ref 3.4–10.8)

## 2021-05-11 LAB — BASIC METABOLIC PANEL
BUN/Creatinine Ratio: 22 (ref 9–23)
BUN: 14 mg/dL (ref 6–24)
CO2: 21 mmol/L (ref 20–29)
Calcium: 9.5 mg/dL (ref 8.7–10.2)
Chloride: 104 mmol/L (ref 96–106)
Creatinine, Ser: 0.65 mg/dL (ref 0.57–1.00)
Glucose: 231 mg/dL — ABNORMAL HIGH (ref 70–99)
Potassium: 4.4 mmol/L (ref 3.5–5.2)
Sodium: 141 mmol/L (ref 134–144)
eGFR: 107 mL/min/{1.73_m2} (ref >59)

## 2021-05-11 LAB — BRAIN NATRIURETIC PEPTIDE: BNP: 37.7 pg/mL (ref 0.0–100.0)

## 2021-05-13 ENCOUNTER — Telehealth: Payer: Self-pay

## 2021-05-13 NOTE — Telephone Encounter (Signed)
Patient returned call regarding results. 

## 2021-05-13 NOTE — Telephone Encounter (Addendum)
Spoke with patients spouse advised for patient to call office.----- Message from Jodelle Gross, NP sent at 05/11/2021  3:36 PM EDT ----- I have reviewed her labs. Poor blood glucose control. She should see PCP for this soon. Otherwise no changes.   Please call and check on her to see if her breathing is better with the changes in medication. If BP lower than 90/50 stop Entersto. If breathing no better please order CXR and D-Dimer.   Let me know how she is. You can text me  KL

## 2021-05-16 NOTE — Progress Notes (Signed)
Cardiology Office Note   Date:  05/17/2021   ID:  Amanda Frederick, DOB 12-06-1970, MRN [id number]  PCP:  Paulina Fusi, MD  Cardiologist:  Dr.Acharya  CC: Close Follow Up Combined CHF    History of Present Illness: Amanda Frederick is a medically complex 50 y.o. female who presents for 1 week follow-up after being seen on 05/10/2021 with history of combined diastolic systolic heart failure, left pleural effusion status postthoracentesis (700 cc of fluid removed on 02/17/2021) coronary artery disease with recurrent chest pain, hyperlipidemia hypertension.  Other history includes fibromyalgia and type 2 diabetes on insulin pump with diabetic neuropathy.  She is status post bimalleolar ankle fracture repair on 01/04/2021 by Dr. Magnus Ivan.  Echocardiogram revealed an LVEF of 25 to 30% with restrictive diastolic filling pattern and grade 3 diastolic dysfunction, with severe left ventricular dysfunction. Due to reduced ejection fraction the patient underwent a cardiac catheterization which revealed diffuse 50% stenosis in the LAD, OM1, OM 2, diagonal 2, and a 90% mid circumflex.  The patient had PCI of the mid circumflex.  She was initially placed on metoprolol but had episodes of hypotension and therefore the patient refused.  She was placed on Brilinta and aspirin.  When I saw her 1 week ago she had multiple complaints concerning her breathing status, unable to take deep breaths, rash on her face, no energy, feeling her heart racing, and edema in lower extremities.She expressed a lot of anxiety.   I made multiple medication changes during that office visit to include stopping Brilinta, and changing her to Plavix after reloading her with 300 mg and then continuing at 75 mg a day.  Due to lower extremity edema I cautiously increased her Lasix to 40 mg daily for 2 days and then returned her to 20 mg daily thereafter with concerns about decreasing preload.    I checked a CBC, and a BNP along with a BMET.   For tachycardia I increased her metoprolol from 12.5 mg daily to 25 mg daily.  If blood pressure became too low (less than 100/50 (, she would stop her Entresto temporarily.  She was to take her blood pressure daily and record it.  I did reach out to primary cardiologist Dr.Acharya for advisement and review of my note.  It was advised that she may need referral to Advanced Heart Failure if symptoms were persistent and more intensive treatment was needed.   Review of labs on 05/10/2021 revealed a potassium of 4.4, creatinine of 0.65, BNP of 37.7, hemoglobin of 15.0, hematocrit of 44.7.  Her glucose was 231.  It was recommended on this reviewed that she see her PCP for medication management and insulin pump adjustment in the setting of difficult to control hyperglycemia.  She is feeling much better today concerning breathing status with change from Brilinta to Plavix.  Lower extremity edema has improved.  She continues slightly tachycardic despite increased dose of metoprolol to 25 mg twice daily per EKG, but with auscultation heart rate in the 70s.  Overall improvement.     Past Medical History:  Diagnosis Date   AC (acromioclavicular) joint bone spurs    Anemia    Anxiety    Arthritis    Bronchitis    Bulging lumbar disc    Bursitis    Complication of anesthesia    "has died 3 separate times under anesthesia"   Depression    Diabetes (HCC)    Environmental allergies    Fibromyalgia    High blood  pressure    High cholesterol    History of COVID-19 01/2020   was hospitalized   History of endometriosis    History of uterine fibroid    Migraines    Neuropathy    eyes , hands and feet    Pinched nerve    Pneumonia    Seizure (HCC)    after eye test only   Spondylosis    Tachycardia    after COVID   Tendonitis    Vision abnormalities     Past Surgical History:  Procedure Laterality Date   CORONARY STENT INTERVENTION N/A 02/19/2021   Procedure: CORONARY STENT INTERVENTION;   Surgeon: Corky Crafts, MD;  Location: [facility] INVASIVE CV LAB;  Service: Cardiovascular;  Laterality: N/A;   NASAL SINUS SURGERY     ORIF ANKLE FRACTURE Left 01/04/2021   Procedure: OPEN REDUCTION INTERNAL FIXATION (ORIF) LEFT BIMALLEOLAR ANKLE FRACTURE;  Surgeon: Kathryne Hitch, MD;  Location: [facility];  Service: Orthopedics;  Laterality: Left;   RIGHT/LEFT HEART CATH AND CORONARY ANGIOGRAPHY N/A 02/19/2021   Procedure: RIGHT/LEFT HEART CATH AND CORONARY ANGIOGRAPHY;  Surgeon: Corky Crafts, MD;  Location: [facility] INVASIVE CV LAB;  Service: Cardiovascular;  Laterality: N/A;   TUBAL LIGATION     VAGINAL HYSTERECTOMY       Current Outpatient Medications  Medication Sig Dispense Refill   atorvastatin (LIPITOR) 80 MG tablet TAKE 1 TABLET BY MOUTH EVERY DAY 30 tablet 1   cholecalciferol (VITAMIN D) 1000 UNITS tablet Take 1,000 Units by mouth daily.     clopidogrel (PLAVIX) 75 MG tablet Take 1 tablet (75 mg total) by mouth daily. 90 tablet 3   ENTRESTO 24-26 MG TAKE 1 TABLET BY MOUTH TWICE A DAY 60 tablet 1   fish oil-omega-3 fatty acids 1000 MG capsule Take 2 g by mouth 2 (two) times daily.     furosemide (LASIX) 20 MG tablet Take 2 tablets (40 mg total) by mouth daily. 30 tablet 5   gabapentin (NEURONTIN) 100 MG capsule Take 1 capsule (100 mg total) by mouth 3 (three) times daily as needed (nerve pain). 60 capsule 1   glucose blood (BAYER CONTOUR NEXT TEST) test strip Test 8-10 times per day 8-10 lancets/day 300 each 11   insulin aspart (NOVOLOG) 100 UNIT/ML injection For use in pump, total of 120 units per day 5 vial 12   Insulin Infusion Pump Supplies (PARADIGM QUICK-SET 32" ) MISC 1 Device by Does not apply route continuous. Vovolog     Magnesium 250 MG TABS Take 250 mg by mouth daily.     metoprolol succinate (TOPROL XL) 25 MG 24 hr tablet Take 1 tablet (25 mg total) by mouth 2 (two) times daily. 180 tablet 1   Multiple Vitamin (MULTIVITAMIN) capsule Take 1 capsule by mouth  daily.     oxyCODONE (OXY IR/ROXICODONE) 5 MG immediate release tablet Take 1 tablet (5 mg total) by mouth every 6 (six) hours as needed for moderate pain (pain score 4-6). 30 tablet 0   tiZANidine (ZANAFLEX) 4 MG tablet Take 1 tablet (4 mg total) by mouth every 8 (eight) hours as needed for muscle spasms. 30 tablet 0   TRELEGY ELLIPTA 200-62.5-25 MCG/INH AEPB Inhale 1 puff into the lungs daily.     pantoprazole (PROTONIX) 40 MG tablet Take 1 tablet (40 mg total) by mouth daily for 30 doses. (Patient not taking: Reported on 05/10/2021) 30 tablet 0   No current facility-administered medications for this visit.    Allergies:  Fentanyl and Other    Social History:  The patient  reports that she has never smoked. She has never used smokeless tobacco. She reports that she does not currently use alcohol. She reports that she does not use drugs.   Family History:  The patient's family history includes Anemia in her mother; Arthritis in her mother; Diabetes in her mother; High Cholesterol in her mother.    ROS: All other systems are reviewed and negative. Unless otherwise mentioned in H&P    PHYSICAL EXAM: VS:  BP 114/78   Pulse 96   Ht 5\' 6"  (1.676 m)   Wt 192 lb 9.6 oz (87.4 kg)   SpO2 97%   BMI 31.09 kg/m  , BMI Body mass index is 31.09 kg/m. GEN: Well nourished, well developed, in no acute distress HEENT: normal Neck: no JVD, carotid bruits, or masses Cardiac: RRR; no murmurs, rubs, or gallops,no edema  Respiratory:  Clear to auscultation bilaterally, normal work of breathing GI: soft, nontender, nondistended, + BS MS: no deformity or atrophy Skin: warm and dry, no rash Neuro:  Strength and sensation are intact Psych: euthymic mood, full affect   EKG:  EKG is ordered today. The ekg ordered today demonstrates personally reviewed: Normal sinus rhythm heart rate of 91 bpm with left atrial enlargement.   Recent Labs: March 15, 2021: ALT 34 02/19/2021: Magnesium 2.0; TSH  2.108 05/10/2021: BNP 37.7; BUN 14; Creatinine, Ser 0.65; Hemoglobin 15.0; Platelets 310; Potassium 4.4; Sodium 141    Lipid Panel    Component Value Date/Time   CHOL 153 2021-03-15 0249   TRIG 60 2021-03-15 0249   HDL 44 03-15-2021 0249   CHOLHDL 3.5 03-15-21 0249   VLDL 12 March 15, 2021 0249   LDLCALC 97 March 15, 2021 0249      Wt Readings from Last 3 Encounters:  05/17/21 192 lb 9.6 oz (87.4 kg)  05/10/21 190 lb (86.2 kg)  02/26/21 175 lb 3.2 oz (79.5 kg)      Other studies Reviewed: Echocardiogram Mar 15, 2021 1. Global hypokinesis with apical akinesis; overall severe LV  dysfunction.   2. Left ventricular ejection fraction, by estimation, is 25 to 30%. The  left ventricle has severely decreased function. The left ventricle  demonstrates regional wall motion abnormalities (see scoring  diagram/findings for description). Left ventricular  diastolic parameters are consistent with Grade III diastolic dysfunction  (restrictive). Elevated left atrial pressure.   3. Right ventricular systolic function is normal. The right ventricular  size is normal. There is mildly elevated pulmonary artery systolic  pressure.   4. Large pleural effusion in the left lateral region.   5. The mitral valve is normal in structure. Mild mitral valve  regurgitation. No evidence of mitral stenosis.   6. The aortic valve is tricuspid. Aortic valve regurgitation is not  visualized. No aortic stenosis is present.   7. The inferior vena cava is dilated in size with >50% respiratory  variability, suggesting right atrial pressure of 8 mmHg     Cardiac cath:02/19/2021   1st Mrg lesion is 50% stenosed.   2nd Mrg lesion is 50% stenosed.   2nd Diag lesion is 50% stenosed.   Mid LAD to Dist LAD lesion is 50% stenosed.   Mid Cx lesion is 90% stenosed.   A drug-eluting stent was successfully placed using a STENT ONYX FRONTIER 2.75X22, postdilated to 3.25 mm.   Post intervention, there is a 0% residual  stenosis.   The left ventricular systolic function is normal.   LV end diastolic pressure  is normal.   The left ventricular ejection fraction is 25-35% by visual estimate.   There is no aortic valve stenosis.   Ao sat 100%, PA sat 75%, PA pressure 34/15, mean PA pressure 23 mm Hg; mean PCWP 9 mm Hg; CO 5.02 L/min; CI 2.54   Continue aggressive medical therapy for her LV dysfunction.  Cardiomyopathy may have aspects of both ischemic and nonischemic.  Diffuse, small vessel disease noted in the diagonal, distal LAD and obtuse marginal vessels.   After 12 months of dual antiplatelet therapy with Brilinta, consider clopidogrel monotherapy given her diffuse CAD.  ASSESSMENT AND PLAN:  1.  Chronic combined systolic diastolic CHF: She is having a repeat echocardiogram on May 28, 2021.  She has been on Entresto and metoprolol but has had no improvement in her breathing status until now.  Weight has been essentially stable.  She did gain a good bit of weight when she injured her ankle and has not been as active since having had diagnosis of combined CHF and CAD.  I am encouraged by her improvement.  She is able to lay flat on the exam table without being short of breath or orthopnea.  I would like to titrate up Entresto and metoprolol but would like to evaluate her echocardiogram results.  Would also like to add spironolactone and Jardiance to her medical regimen for optimal therapy.  This will need to be done slowly as she tends to be intolerant of a lot of medications.  I will refer her to advanced heart failure clinic Dr. Gala Romney for his recommendations on further management and titration of medications.  2.  Coronary artery disease: DES to a mid circumflex lesion which was 90% stenosed.  Nonobstructive disease elsewhere.  On last office visit I discontinued Brilinta due to breathing issues and rash and have changed her to Plavix 75 mg daily after loading her with 300 mg for 1 day.  Heart rate is  still a little elevated but better than on last office visit and auscultated at 70 bpm.  Would like to titrate up metoprolol or change to carvedilol however will await echocardiogram.  Continue secondary prevention.  Hopefully she will become more active since she is breathing better.  She is without chest pain.  3.  History of pleural effusion: Status postthoracentesis during hospitalization.  Lungs sound essentially clear today.  No changes in medication regimen.  4.  Hypercholesterolemia: Continue secondary prevention with atorvastatin 80 mg daily and fish oil 2 g by mouth twice daily.  Goal of LDL less than 70.  We will need to have repeat labs in 3 months.  5.  Insulin-dependent diabetes: Has an insulin pump blood glucose has been labile.  Reluctant to start SGLT inhibitor in the setting.  She is going to see a new endocrinologist, but has not yet been established.  6.  Anxiety: Last seen on this office visit.  She is feeling some better and is calmer.  Current medicines are reviewed at length with the patient today.  I have spent 30 min's  dedicated to the care of this patient on the date of this encounter to include pre-visit review of records, assessment, management and diagnostic testing,with shared decision making.  Labs/ tests ordered today include: Echo pending  Bettey Mare. Liborio Nixon, ANP, AACC   05/17/2021 5:04 PM    [facility] [address] Office [phone] Fax [phone]  Notice: This dictation was prepared with Dragon dictation along with  smaller phrase technology. Any transcriptional errors that result from this process are unintentional and may not be corrected upon review.

## 2021-05-17 ENCOUNTER — Encounter: Payer: Self-pay | Admitting: Adult Health

## 2021-05-17 ENCOUNTER — Other Ambulatory Visit: Payer: Self-pay

## 2021-05-17 ENCOUNTER — Ambulatory Visit (INDEPENDENT_AMBULATORY_CARE_PROVIDER_SITE_OTHER): Payer: 59 | Admitting: Adult Health

## 2021-05-17 VITALS — BP 114/78 | HR 96 | Ht 66.0 in | Wt 192.6 lb

## 2021-05-17 DIAGNOSIS — E7800 Pure hypercholesterolemia, unspecified: Secondary | ICD-10-CM

## 2021-05-17 DIAGNOSIS — I5042 Chronic combined systolic (congestive) and diastolic (congestive) heart failure: Secondary | ICD-10-CM | POA: Diagnosis not present

## 2021-05-17 DIAGNOSIS — S82842A Displaced bimalleolar fracture of left lower leg, initial encounter for closed fracture: Secondary | ICD-10-CM

## 2021-05-17 DIAGNOSIS — Z794 Long term (current) use of insulin: Secondary | ICD-10-CM

## 2021-05-17 DIAGNOSIS — I2510 Atherosclerotic heart disease of native coronary artery without angina pectoris: Secondary | ICD-10-CM

## 2021-05-17 DIAGNOSIS — J90 Pleural effusion, not elsewhere classified: Secondary | ICD-10-CM

## 2021-05-17 DIAGNOSIS — E139 Other specified diabetes mellitus without complications: Secondary | ICD-10-CM

## 2021-05-17 NOTE — Patient Instructions (Signed)
Medication Instructions:  No Changes *If you need a refill on your cardiac medications before your next appointment, please call your pharmacy*   Lab Work: No Labs If you have labs (blood work) drawn today and your tests are completely normal, you will receive your results only by: MyChart Message (if you have MyChart) OR A paper copy in the mail If you have any lab test that is abnormal or we need to change your treatment, we will call you to review the results.   Testing/Procedures: No Testing   Follow-Up: At [facility], you and your health needs are our priority.  As part of our continuing mission to provide you with exceptional heart care, we have created designated Provider Care Teams.  These Care Teams include your primary Cardiologist (physician) and Advanced Practice Providers (APPs -  Physician Assistants and Nurse Practitioners) who all work together to provide you with the care you need, when you need it.  We recommend signing up for the patient portal called "MyChart".  Sign up information is provided on this After Visit Summary.  MyChart is used to connect with patients for Virtual Visits (Telemedicine).  Patients are able to view lab/test results, encounter notes, upcoming appointments, etc.  Non-urgent messages can be sent to your provider as well.   To learn more about what you can do with MyChart, go to [email or web address].    Your next appointment:   3 month(s)  The format for your next appointment:   In Person  Provider:   Joni Reining, DNP, ANP

## 2021-05-23 ENCOUNTER — Encounter: Payer: Self-pay | Admitting: Orthopaedic Surgery

## 2021-05-23 ENCOUNTER — Ambulatory Visit (INDEPENDENT_AMBULATORY_CARE_PROVIDER_SITE_OTHER): Payer: 59 | Admitting: Orthopaedic Surgery

## 2021-05-23 ENCOUNTER — Other Ambulatory Visit: Payer: Self-pay

## 2021-05-23 DIAGNOSIS — G8929 Other chronic pain: Secondary | ICD-10-CM

## 2021-05-23 DIAGNOSIS — M25562 Pain in left knee: Secondary | ICD-10-CM | POA: Diagnosis not present

## 2021-05-23 DIAGNOSIS — Z9889 Other specified postprocedural states: Secondary | ICD-10-CM

## 2021-05-23 DIAGNOSIS — Z8781 Personal history of (healed) traumatic fracture: Secondary | ICD-10-CM | POA: Diagnosis not present

## 2021-05-23 DIAGNOSIS — S82842A Displaced bimalleolar fracture of left lower leg, initial encounter for closed fracture: Secondary | ICD-10-CM | POA: Diagnosis not present

## 2021-05-23 MED ORDER — GABAPENTIN 300 MG PO CAPS: 300.0000 mg | ORAL_CAPSULE | Freq: Three times a day (TID) | ORAL | 1 refills | Status: DC

## 2021-05-23 NOTE — Progress Notes (Signed)
The patient is now 4-1/2 months status post open reduction/internal fixation of a complex left ankle fracture dislocation.  She had fixation of the medial and lateral malleolus.  Postoperatively she did have congestive heart failure and is still dealing with those issues.  She is only 50 years old.  She says her blood glucose has been running high as well.  Her biggest complaint is a burning pain in her knee.  This is getting worse for her.  I had her on Neurontin just 200 mg up to 3 times a day.  Examination of her left knee shows global tenderness.  This is still not getting better for her in spite of conservative treatment.  There is good range of motion of the knee but medial lateral joint line tenderness the knee is now popping as well as locking and catching.  Her left operative ankle seems to be moving well.  The medial lateral incisions of healed nicely but she does get a pulling sensation over the medial incision.  I am going to increase her Neurontin to 300 mg 3 times a day and would like to order MRI of her left knee to rule out a meniscal tear given the severe trauma she had to that left lower extremity.  We will see her back in follow-up for the MRI of her left knee we will also get 3 views of her left ankle.  All questions and concerns were answered and addressed.  I would not recommend a steroid injection in her left knee given her height and blood glucose.

## 2021-05-24 ENCOUNTER — Other Ambulatory Visit: Payer: Self-pay

## 2021-05-24 DIAGNOSIS — M25562 Pain in left knee: Secondary | ICD-10-CM

## 2021-05-24 DIAGNOSIS — G8929 Other chronic pain: Secondary | ICD-10-CM

## 2021-05-28 ENCOUNTER — Ambulatory Visit (HOSPITAL_COMMUNITY): Payer: 59 | Attending: Cardiology

## 2021-05-28 ENCOUNTER — Other Ambulatory Visit: Payer: Self-pay

## 2021-05-28 DIAGNOSIS — I5021 Acute systolic (congestive) heart failure: Secondary | ICD-10-CM | POA: Diagnosis present

## 2021-05-28 DIAGNOSIS — E139 Other specified diabetes mellitus without complications: Secondary | ICD-10-CM | POA: Insufficient documentation

## 2021-05-28 DIAGNOSIS — I5022 Chronic systolic (congestive) heart failure: Secondary | ICD-10-CM | POA: Diagnosis not present

## 2021-05-28 DIAGNOSIS — I2510 Atherosclerotic heart disease of native coronary artery without angina pectoris: Secondary | ICD-10-CM | POA: Diagnosis present

## 2021-05-28 DIAGNOSIS — E785 Hyperlipidemia, unspecified: Secondary | ICD-10-CM | POA: Diagnosis not present

## 2021-05-28 DIAGNOSIS — R000 Tachycardia, unspecified: Secondary | ICD-10-CM | POA: Insufficient documentation

## 2021-05-28 DIAGNOSIS — Z794 Long term (current) use of insulin: Secondary | ICD-10-CM | POA: Insufficient documentation

## 2021-05-28 LAB — ECHOCARDIOGRAM COMPLETE
Area-P 1/2: 6.32 cm2
S' Lateral: 3.6 cm

## 2021-06-05 ENCOUNTER — Other Ambulatory Visit: Payer: Self-pay | Admitting: Internal Medicine

## 2021-06-07 ENCOUNTER — Encounter: Payer: Self-pay | Admitting: *Deleted

## 2021-06-13 ENCOUNTER — Telehealth: Payer: Self-pay | Admitting: Adult Health

## 2021-06-13 MED ORDER — ENTRESTO 24-26 MG PO TABS: 1.0000 | ORAL_TABLET | Freq: Two times a day (BID) | ORAL | 1 refills | Status: DC

## 2021-06-13 NOTE — Telephone Encounter (Signed)
*  STAT* If patient is at the pharmacy, call can be transferred to refill team.   1. Which medications need to be refilled? (please list name of each medication and dose if known) atorvastatin (LIPITOR) 80 MG tablet; ENTRESTO 24-26 MG  2. Which pharmacy/location (including street and city if local pharmacy) is medication to be sent to?  [id number] - [place], [place] - [address] [facility]  3. Do they need a 30 day or 90 day supply? 90

## 2021-06-24 ENCOUNTER — Other Ambulatory Visit: Payer: Self-pay

## 2021-06-24 MED ORDER — ATORVASTATIN CALCIUM 80 MG PO TABS: 80.0000 mg | ORAL_TABLET | Freq: Every day | ORAL | 3 refills | Status: DC

## 2021-06-28 ENCOUNTER — Other Ambulatory Visit: Payer: Self-pay

## 2021-06-28 ENCOUNTER — Ambulatory Visit
Admission: RE | Admit: 2021-06-28 | Discharge: 2021-06-28 | Disposition: A | Discharge status: Home / Self Care | Payer: 59 | Source: Ambulatory Visit | Attending: Orthopaedic Surgery | Admitting: Orthopaedic Surgery

## 2021-06-28 DIAGNOSIS — G8929 Other chronic pain: Secondary | ICD-10-CM

## 2021-07-06 ENCOUNTER — Other Ambulatory Visit: Payer: Self-pay | Admitting: Orthopaedic Surgery

## 2021-07-10 ENCOUNTER — Ambulatory Visit: Payer: 59 | Admitting: Orthopaedic Surgery

## 2021-08-05 ENCOUNTER — Ambulatory Visit (INDEPENDENT_AMBULATORY_CARE_PROVIDER_SITE_OTHER): Payer: 59

## 2021-08-05 ENCOUNTER — Ambulatory Visit (INDEPENDENT_AMBULATORY_CARE_PROVIDER_SITE_OTHER): Payer: 59 | Admitting: Orthopaedic Surgery

## 2021-08-05 ENCOUNTER — Other Ambulatory Visit: Payer: Self-pay

## 2021-08-05 ENCOUNTER — Encounter: Payer: Self-pay | Admitting: Orthopaedic Surgery

## 2021-08-05 DIAGNOSIS — Z8781 Personal history of (healed) traumatic fracture: Secondary | ICD-10-CM | POA: Diagnosis not present

## 2021-08-05 DIAGNOSIS — Z9889 Other specified postprocedural states: Secondary | ICD-10-CM | POA: Diagnosis not present

## 2021-08-05 DIAGNOSIS — S82842A Displaced bimalleolar fracture of left lower leg, initial encounter for closed fracture: Secondary | ICD-10-CM

## 2021-08-05 NOTE — Progress Notes (Signed)
The patient is now 6 months status post open reduction/internal fixation of a left ankle fracture dislocation.  When I saw her last visit she was having issues with her left knee with a burning sensation we felt was reasonable at this point to obtain an MRI of her left knee.  She is here for review of that today as well as new x-rays of her left ankle.  She does report medial and lateral ankle pain and pain on her heel.  She also does have some left knee pain.  She is diabetic but reports good control.  She is also a patient of the heart failure clinic and Dr. Gala Romney who she sees later this week.  She is walking without assistive device and overall looks better to me.  Examination of her left knee does show some pain at the patella but otherwise a ligamentously stable knee with excellent range of motion of the left knee.  The MRI of her left knee just shows some edema in Hoffa's fat pad but no evidence of meniscal tear or ligamentous disruption.  There is only some mild arthritic changes.  Examination of her left ankle shows excellent range of motion of the left ankle and is ligamentously stable.  Her incisions of healed nicely.  3 views of the left ankle show the fractures of healed and the hardware is intact.  This point she will continue to increase her activities from my standpoint as comfort allows.  I would like to see her back at the 1 year standpoint from her ankle injury with a repeat 3 views left ankle to see how she is doing overall from the potential of posttraumatic arthritis.  All questions and concerns were answered and addressed.

## 2021-08-07 ENCOUNTER — Other Ambulatory Visit: Payer: Self-pay

## 2021-08-07 ENCOUNTER — Encounter (HOSPITAL_COMMUNITY): Payer: Self-pay | Admitting: Internal Medicine

## 2021-08-07 ENCOUNTER — Ambulatory Visit (HOSPITAL_COMMUNITY)
Admission: RE | Admit: 2021-08-07 | Discharge: 2021-08-07 | Disposition: A | Discharge status: Home / Self Care | Payer: 59 | Source: Ambulatory Visit | Attending: Internal Medicine | Admitting: Internal Medicine

## 2021-08-07 VITALS — BP 132/84 | HR 81 | Ht 66.0 in | Wt 199.0 lb

## 2021-08-07 DIAGNOSIS — R0609 Other forms of dyspnea: Secondary | ICD-10-CM | POA: Insufficient documentation

## 2021-08-07 DIAGNOSIS — Z7982 Long term (current) use of aspirin: Secondary | ICD-10-CM | POA: Insufficient documentation

## 2021-08-07 DIAGNOSIS — Z79899 Other long term (current) drug therapy: Secondary | ICD-10-CM | POA: Insufficient documentation

## 2021-08-07 DIAGNOSIS — Z6832 Body mass index (BMI) 32.0-32.9, adult: Secondary | ICD-10-CM | POA: Insufficient documentation

## 2021-08-07 DIAGNOSIS — I951 Orthostatic hypotension: Secondary | ICD-10-CM | POA: Insufficient documentation

## 2021-08-07 DIAGNOSIS — M797 Fibromyalgia: Secondary | ICD-10-CM | POA: Insufficient documentation

## 2021-08-07 DIAGNOSIS — I5022 Chronic systolic (congestive) heart failure: Secondary | ICD-10-CM | POA: Diagnosis not present

## 2021-08-07 DIAGNOSIS — U099 Post covid-19 condition, unspecified: Secondary | ICD-10-CM | POA: Diagnosis not present

## 2021-08-07 DIAGNOSIS — E669 Obesity, unspecified: Secondary | ICD-10-CM | POA: Diagnosis not present

## 2021-08-07 DIAGNOSIS — R911 Solitary pulmonary nodule: Secondary | ICD-10-CM | POA: Insufficient documentation

## 2021-08-07 DIAGNOSIS — F40240 Claustrophobia: Secondary | ICD-10-CM | POA: Insufficient documentation

## 2021-08-07 DIAGNOSIS — E785 Hyperlipidemia, unspecified: Secondary | ICD-10-CM | POA: Diagnosis not present

## 2021-08-07 DIAGNOSIS — Z9641 Presence of insulin pump (external) (internal): Secondary | ICD-10-CM | POA: Insufficient documentation

## 2021-08-07 DIAGNOSIS — I110 Hypertensive heart disease with heart failure: Secondary | ICD-10-CM | POA: Diagnosis not present

## 2021-08-07 DIAGNOSIS — Z955 Presence of coronary angioplasty implant and graft: Secondary | ICD-10-CM | POA: Diagnosis not present

## 2021-08-07 DIAGNOSIS — I2510 Atherosclerotic heart disease of native coronary artery without angina pectoris: Secondary | ICD-10-CM

## 2021-08-07 DIAGNOSIS — E1040 Type 1 diabetes mellitus with diabetic neuropathy, unspecified: Secondary | ICD-10-CM | POA: Insufficient documentation

## 2021-08-07 DIAGNOSIS — I5042 Chronic combined systolic (congestive) and diastolic (congestive) heart failure: Secondary | ICD-10-CM

## 2021-08-07 DIAGNOSIS — Z7902 Long term (current) use of antithrombotics/antiplatelets: Secondary | ICD-10-CM | POA: Insufficient documentation

## 2021-08-07 DIAGNOSIS — J90 Pleural effusion, not elsewhere classified: Secondary | ICD-10-CM | POA: Diagnosis not present

## 2021-08-07 DIAGNOSIS — F419 Anxiety disorder, unspecified: Secondary | ICD-10-CM | POA: Insufficient documentation

## 2021-08-07 LAB — BASIC METABOLIC PANEL
Anion gap: 11 (ref 5–15)
BUN: 12 mg/dL (ref 6–20)
CO2: 21 mmol/L — ABNORMAL LOW (ref 22–32)
Calcium: 8.8 mg/dL — ABNORMAL LOW (ref 8.9–10.3)
Chloride: 109 mmol/L (ref 98–111)
Creatinine, Ser: 0.65 mg/dL (ref 0.44–1.00)
GFR, Estimated: >60 mL/min (ref >60)
Glucose, Bld: 95 mg/dL (ref 70–99)
Potassium: 3.6 mmol/L (ref 3.5–5.1)
Sodium: 141 mmol/L (ref 135–145)

## 2021-08-07 LAB — CBC
HCT: 44.6 % (ref 36.0–46.0)
Hemoglobin: 15 g/dL (ref 12.0–15.0)
MCH: 28.9 pg (ref 26.0–34.0)
MCHC: 33.6 g/dL (ref 30.0–36.0)
MCV: 85.9 fL (ref 80.0–100.0)
Platelets: 247 10*3/uL (ref 150–400)
RBC: 5.19 MIL/uL — ABNORMAL HIGH (ref 3.87–5.11)
RDW: 12.7 % (ref 11.5–15.5)
WBC: 6.7 10*3/uL (ref 4.0–10.5)
nRBC: 0 % (ref 0.0–0.2)

## 2021-08-07 LAB — BRAIN NATRIURETIC PEPTIDE: B Natriuretic Peptide: 199.3 pg/mL — ABNORMAL HIGH (ref 0.0–100.0)

## 2021-08-07 MED ORDER — SPIRONOLACTONE 25 MG PO TABS: 12.5000 mg | ORAL_TABLET | Freq: Every day | ORAL | 3 refills | Status: DC

## 2021-08-07 NOTE — Patient Instructions (Addendum)
EKG done today.  RedsClip done today.   Labs done today. We will contact you only if your labs are abnormal.  START Spironolactone 12.5mg  (1/2 tablet) by mouth daily.   No other medication changes were made. Please continue all current medications as prescribed.  You have been referred to Cardiac rehab at [facility]. They will contact you to schedule an appointment.   Your physician recommends that you schedule a follow-up appointment in: 6-8 weeks with an echo prior to your exam.  Your physician has requested that you have an echocardiogram. Echocardiography is a painless test that uses sound waves to create images of your heart. It provides your doctor with information about the size and shape of your heart and how well your hearts chambers and valves are working. This procedure takes approximately one hour. There are no restrictions for this procedure.  If you have any questions or concerns before your next appointment please send [place] a message through [place] or call our office at [phone].    TO LEAVE A MESSAGE FOR THE NURSE SELECT OPTION 2, PLEASE LEAVE A MESSAGE INCLUDING: YOUR NAME DATE OF BIRTH CALL BACK NUMBER REASON FOR CALL**this is important as we prioritize the call backs  YOU WILL RECEIVE A CALL BACK THE SAME DAY AS LONG AS YOU CALL BEFORE 4:00 PM   Do the following things EVERYDAY: Weigh yourself in the morning before breakfast. Write it down and keep it in a log. Take your medicines as prescribed Eat low salt foods--Limit salt (sodium) to 2000 mg per day.  Stay as active as you can everyday Limit all fluids for the day to less than 2 liters   At the [facility], you and your health needs are our priority. As part of our continuing mission to provide you with exceptional heart care, we have created designated Provider Care Teams. These Care Teams include your primary Cardiologist (physician) and Advanced Practice Providers (APPs-  Physician Assistants and Nurse Practitioners) who all work together to provide you with the care you need, when you need it.   You may see any of the following providers on your designated Care Team at your next follow up: Dr Arvilla Meres Dr Carron Curie, NP Robbie Lis, [place] Karle Plumber, PharmD   Please be sure to bring in all your medications bottles to every appointment.

## 2021-08-07 NOTE — Progress Notes (Signed)
Advanced [facility] Failure Clinic Note   Referring Physician: PCP: Nicoletta Dress, MD PCP-Cardiologist: Elouise Munroe, MD   HPI: Amanda Frederick is a 51 y.o. female with history of DM1, HTN, HLD, hx fibromyalgia, neuropathy, hx COVID-19 infection 02/2020. No cardiac history until recently.   Reports gradually progressing dyspnea with exertion for a couple of years. Felt horrible after COVID and dyspnea never really improved. Underwent ORIF for ankle fracture in June 2022. Initially did okay post-op. Subsequently developed LE edema, dyspnea and orthopnea. She was later admitted July 2022 with acute systolic CHF.   Echo 07/22: EF 25-30%, RV okay, large left pleural effusion, mild MR. Diuresed with IV lasix. Underwent thoracentesis for L pleural effusion.  R/LHC 07/22:  50% OM1, 50% OM2, 50% D2, 50% mid to distal LAD, 90% mid Lcx treated with PCI/DES RA 6 PA 34/15 (23) PCWP 9 mmHg Fick CO 5.02 Fick CI 2.54 PA sat 75%  Her cardiomyopathy was felt to be likely combination of both ischemic and nonischemic etiologies. Started on GDMT with entresto and toprol xl. Continued to have dyspnea at subsequent cardiology follow-ups. Brilinta switched to plavix and increased dose of furosemide for several days with improvement in symptoms.   Echo 11/22: EF 30-35%, septal and peri-apical severe hypokinesis, RV okay, mild MR  She was referred to Advanced Heart Failure for ongoing management of CHF.  Notes some improvement in dyspnea over last couple of months but gets winded walking more than 150 feet. Household chores are difficult. Some days are better than others. Reports pinpoint left sided chest pain worse with inspiration and direct palpation. Has been chronic. Also random shooting pains up left side and sometimes right side of chest. No exertional CP. Denies orthopnea or PND. Frequent dizziness with position changes but no recent syncope.  BP tends to average around 120/80 but occasionally dips to [id number]  systolic. Has been compliant with medications. Does not weigh daily.    Review of Systems: [y] = yes, [ ]  = no   General: Weight gain [ ] ; Weight loss [ ] ; Anorexia [ ] ; Fatigue [ ] ; Fever [ ] ; Chills [ ] ; Weakness [ ]   Cardiac: Chest pain/pressure [Y]; Resting SOB [ ] ; Exertional SOB [Y]; Orthopnea [ ] ; Pedal Edema [ ] ; Palpitations [ ] ; Syncope [ ] ; Presyncope [ ] ; Paroxysmal nocturnal dyspnea[ ]   Pulmonary: Cough [ ] ; Wheezing[ ] ; Hemoptysis[ ] ; Sputum [ ] ; Snoring [ ]   GI: Vomiting[ ] ; Dysphagia[ ] ; Melena[ ] ; Hematochezia [ ] ; Heartburn[ ] ; Abdominal pain [ ] ; Constipation [ ] ; Diarrhea [ ] ; BRBPR [ ]   GU: Hematuria[ ] ; Dysuria [ ] ; Nocturia[ ]   Vascular: Pain in legs with walking [ ] ; Pain in feet with lying flat [ ] ; Non-healing sores [ ] ; Stroke [ ] ; TIA [ ] ; Slurred speech [ ] ;  Neuro: Headaches[ ] ; Vertigo[ ] ; Seizures[ ] ; Paresthesias[ ] ;Blurred vision [ ] ; Diplopia [ ] ; Vision changes [ ]   Ortho/Skin: Arthritis [ ] ; Joint pain [Y]; Muscle pain [ ] ; Joint swelling [ ] ; Back Pain [ ] ; Rash [ ]   Psych: Depression[ ] ; Anxiety[ ]   Heme: Bleeding problems [ ] ; Clotting disorders [ ] ; Anemia [ ]   Endocrine: Diabetes [Y]; Thyroid dysfunction[ ]    Past Medical History:  Diagnosis Date   AC (acromioclavicular) joint bone spurs    Anemia    Anxiety    Arthritis    Bronchitis    Bulging lumbar disc    Bursitis    Complication of anesthesia    "  has died 3 separate times under anesthesia"   Depression    Diabetes ([place])    Environmental allergies    Fibromyalgia    High blood pressure    High cholesterol    History of COVID-19 01/2020   was hospitalized   History of endometriosis    History of uterine fibroid    Migraines    Neuropathy    eyes , hands and feet    Pinched nerve    Pneumonia    Seizure ([facility])    after eye test only   Spondylosis    Tachycardia    after COVID   Tendonitis    Vision abnormalities     Current Outpatient Medications  Medication Sig  Dispense Refill   atorvastatin (LIPITOR) 80 MG tablet Take 1 tablet (80 mg total) by mouth daily. 90 tablet 3   clopidogrel (PLAVIX) 75 MG tablet Take 1 tablet (75 mg total) by mouth daily. 90 tablet 3   furosemide (LASIX) 20 MG tablet Take 20 mg by mouth 2 (two) times daily.     gabapentin (NEURONTIN) 300 MG capsule TAKE 1 CAPSULE BY MOUTH THREE TIMES A DAY 60 capsule 1   glucose blood (BAYER CONTOUR NEXT TEST) test strip Test 8-10 times per day 8-10 lancets/day 300 each 11   insulin aspart (NOVOLOG) 100 UNIT/ML injection For use in pump, total of 120 units per day 5 vial 12   Insulin Infusion Pump Supplies (PARADIGM QUICK-SET 32" 6MM) MISC 1 Device by Does not apply route continuous. Vovolog     metoprolol succinate (TOPROL XL) 25 MG 24 hr tablet Take 1 tablet (25 mg total) by mouth 2 (two) times daily. 180 tablet 1   sacubitril-valsartan (ENTRESTO) 24-26 MG Take 1 tablet by mouth 2 (two) times daily.     cholecalciferol (VITAMIN D) 1000 UNITS tablet Take 1,000 Units by mouth daily. (Patient not taking: Reported on 08/07/2021)     Magnesium 250 MG TABS Take 250 mg by mouth daily. (Patient not taking: Reported on 08/07/2021)     Multiple Vitamin (MULTIVITAMIN) capsule Take 1 capsule by mouth daily. (Patient not taking: Reported on 08/07/2021)     No current facility-administered medications for this encounter.    Allergies  Allergen Reactions   Fentanyl Nausea And Vomiting   Other Hives    Antibiotic specific one unsure but has tolerated antibiotic recently. Reaction was about 20 years ago.      Social History   Socioeconomic History   Marital status: Married    Spouse name: Amanda Frederick   Number of children: 3   Years of education: college   Highest education level: Not on file  Occupational History    Comment: Home maker  Tobacco Use   Smoking status: Never   Smokeless tobacco: Never  Vaping Use   Vaping Use: Not on file  Substance and Sexual Activity   Alcohol use: Not Currently     Comment: special occassions   Drug use: No   Sexual activity: Not on file  Other Topics Concern   Not on file  Social History Narrative   Patient lives at home with her husband Amanda Frederick. 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 Patient drinks 4-6 caffeine drinks daily.   Right handed.         Social Determinants of Health   Financial Resource Strain: Not on file  Food Insecurity: Not on file  Transportation Needs: Not on file  Physical Activity: Not on file  Stress:  Not on file  Social Connections: Not on file  Intimate Partner Violence: Not on file      Family History  Problem Relation Age of Onset   Anemia Mother    High Cholesterol Mother    Diabetes Mother    Arthritis Mother     Vitals:   08/07/21 1443  BP: 132/84  Pulse: 81  SpO2: 97%  Weight: 90.3 kg (199 lb)  Height: 5\' 6"  (1.676 m)     PHYSICAL EXAM: General:  No distress. Appears stated age 74: normal Neck: supple. no JVD. Carotids 2+ bilat; no bruits. No lymphadenopathy or thyromegaly appreciated. Cor: PMI nondisplaced. Regular rate & rhythm. No rubs, gallops or murmurs. Lungs: clear Abdomen: soft, nontender, nondistended. No hepatosplenomegaly. No bruits or masses. Good bowel sounds. Extremities: no cyanosis, clubbing, rash, edema Neuro: alert & oriented x 3, cranial nerves grossly intact. moves all 4 extremities w/o difficulty. Affect pleasant.  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  -Echo 07/22: EF 25-30%, RV okay -R/LHC 07/22: RA 6, PA mean 23, PCWP 9, Fick CI 2.54. 90% Lcx treated with PCI/DES, diffuse disease marginal branches, LAD and diagonal -? Possible myocarditis following COVID-19 infection -Echo 11/22: EF 30-35% RV okay -NYHA III. Volume appears stable on exam and ReDS clip (28%). On 20 mg furosemide twice daily.  -Suspect ongoing dyspnea and activity intolerance multifactorial  (deconditioning, obesity, etc). Referred to stage II cardiac rehab in [facility]. -Continue metoprolol succinate 25 mg daily -Continue entresto 24/26 mg BID -No SGLT2i with Type I DM -GDMT previously limited by orthostatic dizziness and hypotension. BP stable now. Add spiro 12.5 mg daily. -BMET/BNP/CBC today -Declined cMRI d/t severe claustrophobia -Will repeat echo in 6-8 weeks. If EF remains < or = 35%, will refer to EP for consideration of ICD. Not a candidate for CRT d/t narrow QRS.  2. CAD: -s/p PCI/DES mid Lcx 07/22. Diffuse small vessel disease elsewhere as above.  -On DAPT with aspirin 81 mg daily + plavix 75 mg daily.  -Continue Atorvastatin 80. -CP does not appear cardiac in origin  3. Left pleural effusion: -s/p thoracentesis 07/22  4. Hyperlipidemia: -On high-intensity statin -Managed by primary cardiology team  5. Type I DM: -Has insulin pump -A1c 6.8 07/22  6. Pulmonary nodule: -5 mm on CTA chest 07/22 -Nonsmoker -F/u imaging per PCP  Follow-up: 6-8 weeks with echo prior  [facility], LINDSAY N, PA-C 08/07/21   Patient seen and examined with the above-signed Advanced Practice Provider and/or Housestaff. I personally reviewed laboratory data, imaging studies and relevant notes. I independently examined the patient and formulated the important aspects of the plan. I have edited the note to reflect any of my changes or salient points. I have personally discussed the plan with the patient and/or family.  51 y/o woman with DM1, CAD, HL and anxiety referred for further evaluation of her HF.   Improved recently with med changes but still NYHA III symptoms. Volume status looks good. ReDS 28%   Echo 11/22 EF 30-35%  General:  Well appearing. No resp difficulty HEENT: normal Neck: supple. no JVD. Carotids 2+ bilat; no bruits. No lymphadenopathy or thryomegaly appreciated. Cor: PMI nondisplaced. Regular rate & rhythm. No rubs, gallops or murmurs. Lungs:  clear Abdomen: obese soft, nontender, nondistended. No hepatosplenomegaly. No bruits or masses. Good bowel sounds. Extremities: no cyanosis, clubbing, rash, edema Neuro: alert & orientedx3, cranial nerves grossly intact. moves all  4 extremities w/o difficulty. Affect pleasant  Suspect this is primarily NICM. Volume status looks good but reports NYHA III symptoms. Suspect may have component of deconditioning as well.   On decent GDMT. Add spiro 12.5 (Not candidate for SGLT2i with DM1). Ideally would get cMRI but she is claustrophobic. Will repeat echo in 6 weeks. Refer CR. Encouraged her to be more active.   Can get CPX if symptoms not improving.   Glori Bickers, MD  4:36 PM

## 2021-08-07 NOTE — Progress Notes (Signed)
ReDS Vest / Clip - 08/07/21 1500       ReDS Vest / Clip   Station Marker D    Ruler Value 33    ReDS Value Range Low volume    ReDS Actual Value 28

## 2021-08-15 NOTE — Progress Notes (Signed)
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  She underwent ORIF for ankle fracture in June 2022 but developed lower extremity edema, dyspnea and orthopnea, and was diagnosed at that time with acute systolic heart failure leading to aforementioned cardiac cath and pleural effusion. She is also being followed by Dr. Haroldine Frederick in the [facility], last seen on 08/07/2021.   Past Medical History    Past Medical History:  Diagnosis Date   AC (acromioclavicular) joint bone spurs    Anemia    Anxiety    Arthritis    Bronchitis    Bulging lumbar disc    Bursitis    Complication of anesthesia    "has died 3 separate times under anesthesia"   Depression    Diabetes ([place])    Environmental allergies    Fibromyalgia    High blood pressure    High cholesterol    History of COVID-19 01/2020   was hospitalized   History of endometriosis    History of uterine fibroid    Migraines    Neuropathy    eyes , hands and feet    Pinched nerve    Pneumonia    Seizure ([place])    after eye test only   Spondylosis    Tachycardia    after COVID   Tendonitis    Vision abnormalities    Past Surgical History:  Procedure Laterality Date   CORONARY STENT INTERVENTION N/A 02/19/2021   Procedure: CORONARY STENT INTERVENTION;  Surgeon: Jettie Booze, MD;  Location: [facility];  Service:  Cardiovascular;  Laterality: N/A;   NASAL SINUS SURGERY     ORIF ANKLE FRACTURE Left 01/04/2021   Procedure: OPEN REDUCTION INTERNAL FIXATION (ORIF) LEFT BIMALLEOLAR ANKLE FRACTURE;  Surgeon: Mcarthur Rossetti, MD;  Location: [facility];  Service: Orthopedics;  Laterality: Left;   RIGHT/LEFT HEART CATH AND CORONARY ANGIOGRAPHY N/A 02/19/2021   Procedure: RIGHT/LEFT HEART CATH AND CORONARY ANGIOGRAPHY;  Surgeon: Jettie Booze, MD;  Location: [facility];  Service: Cardiovascular;  Laterality: N/A;   TUBAL LIGATION     VAGINAL HYSTERECTOMY      Allergies  Allergies  Allergen Reactions   Fentanyl Nausea And Vomiting   Other Hives    Antibiotic specific one unsure but has tolerated antibiotic recently. Reaction was about 20 years ago.    History of Present Illness    [name] presents today for ongoing assessment and management of ischemic and nonischemic systolic CHF with history of pleural effusion status post thoracentesis, possible myocarditis following COVID-19 infection, EF of 30 to 35% per echo with normal RV.    Has been seen by Dr. Haroldine Frederick on 08/07/2021 at which time she was continued on metoprolol, Entresto, no SGLT2 inhibitor was ordered in the setting of Type 1 diabetes.  He added spironolactone 12.5 mg daily.    A cardiac MRI was offered but she declined due to severe claustrophobia.  Echocardiogram was to repeat in 6 to 8 weeks.  It was recommended that if ejection fraction was less than or equal to 35% EP would be consulted for consideration of ICD.  She was not found to be a candidate for CRT due to narrow QRS.   She comes today with her husband at baseline.  She does not have a lot of energy but she is trying to become more active slowly.  She is able to gather eggs from her chickens and walk from the area where the chickens are caged into her home.  She does not do any heavy lifting, or very strenuous work.  She does admit to being tired easily walking from  her car to the grocery store and therefore does not go out very often.  She can occasionally walk through the grocery store if she has some help or if she is able to ride on the device.  She has New York Heart Association class III symptoms.  She is medically compliant.  She is interested in cardiac rehab but would like to learn more about the facility because it would require a lot of transportation logistics.  Her husband is not always in town and she would like to know the hours so she may be able to go in the evenings.  She continues to have some mild substernal chest pressure with bilateral right pectoral area discomfort.  Sometimes a pinching sharp feeling is noted.  This can occur with and without exertion.   Home Medications    Current Outpatient Medications  Medication Sig Dispense Refill   aspirin 81 MG chewable tablet Chew by mouth daily.     atorvastatin (LIPITOR) 80 MG tablet Take 1 tablet (80 mg total) by mouth daily. 90 tablet 3   cholecalciferol (VITAMIN D) 1000 UNITS tablet Take 1,000 Units by mouth daily.     clopidogrel (PLAVIX) 75 MG tablet Take 1 tablet (75 mg total) by mouth daily. 90 tablet 3   furosemide (LASIX) 20 MG tablet Take 20 mg by mouth 2 (two) times daily.     gabapentin (NEURONTIN) 300 MG capsule TAKE 1 CAPSULE BY MOUTH THREE TIMES A DAY 60 capsule 1   glucose blood (BAYER CONTOUR NEXT TEST) test strip Test 8-10 times per day 8-10 lancets/day 300 each 11   insulin aspart (NOVOLOG) 100 UNIT/ML injection For use in pump, total of 120 units per day 5 vial 12   Insulin Infusion Pump Supplies (PARADIGM QUICK-SET 32" 6MM) MISC 1 Device by Does not apply route continuous. Vovolog     Magnesium 250 MG TABS Take 250 mg by mouth daily.     metoprolol succinate (TOPROL XL) 25 MG 24 hr tablet Take 1 tablet (25 mg total) by mouth 2 (two) times daily. 180 tablet 1   Multiple Vitamin (MULTIVITAMIN) capsule Take 1 capsule by mouth daily.     sacubitril-valsartan (ENTRESTO)  24-26 MG Take 1 tablet by mouth 2 (two) times daily.     spironolactone (ALDACTONE) 25 MG tablet Take 0.5 tablets (12.5 mg total) by mouth daily. 45 tablet 3   No current facility-administered medications for this visit.     Family History    Family History  Problem Relation Age of Onset   Anemia Mother    High Cholesterol Mother    Diabetes Mother    Arthritis Mother    She indicated that her mother is alive. She indicated that her father is deceased.  Social History    Social  History   Socioeconomic History   Marital status: Married    Spouse name: [name]   Number of children: 3   Years of education: college   Highest education level: Not on file  Occupational History    Comment: Home maker  Tobacco Use   Smoking status: Never   Smokeless tobacco: Never  Vaping Use   Vaping Use: Not on file  Substance and Sexual Activity   Alcohol use: Not Currently    Comment: special occassions   Drug use: No   Sexual activity: Not on file  Other Topics Concern   Not on file  Social History Narrative   Patient lives at home with her husband [name]. 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 Patient drinks 4-6 caffeine drinks daily.   Right handed.         Social Determinants of Health   Financial Resource Strain: Not on file  Food Insecurity: Not on file  Transportation Needs: Not on file  Physical Activity: Not on file  Stress: Not on file  Social Connections: Not on file  Intimate Partner Violence: Not on file     Review of Systems    General:  No chills, fever, night sweats or weight changes.  Cardiovascular:  + chest pressure, + dyspnea on exertion, edema, orthopnea, palpitations, paroxysmal nocturnal dyspnea. Dermatological: No rash, lesions/masses Respiratory: No cough, + dyspnea on exertion  Urologic: No hematuria, dysuria Abdominal:   No nausea, vomiting, diarrhea, bright red blood per rectum, melena, or hematemesis Neurologic:  No visual changes,  wkns, changes in mental status. All other systems reviewed and are otherwise negative except as noted above.    Cardiac Rehabilitation Eligibility Assessment     Physical Exam    VS:  BP 118/64    Pulse 79    Ht 5\' 6"  (1.676 m)    Wt 200 lb 9.6 oz (91 kg)    SpO2 99%    BMI 32.38 kg/m  , BMI Body mass index is 32.38 kg/m.     GEN: Well nourished, well developed, in no acute distress. Generalized fatigue. HEENT: normal. Neck: Supple, no JVD, carotid bruits, or masses. Cardiac: RRR, tachycardic no murmurs, rubs, or gallops. No clubbing, cyanosis, edema.  Radials/DP/PT 2+ and equal bilaterally.  Respiratory:  Respirations regular and unlabored, clear to auscultation bilaterally. GI: Soft, nontender, nondistended, BS + x 4. MS: no deformity or atrophy. Skin: warm and dry, no rash. Neuro:  Strength and sensation are intact. Psych: Normal affect.  Accessory Clinical Findings    ECG personally reviewed by me today- NSR with rate of 79, low voltage MRI - No acute changes  Lab Results  Component Value Date   WBC 6.7 08/07/2021   HGB 15.0 08/07/2021   HCT 44.6 08/07/2021   MCV 85.9 08/07/2021   PLT 247 08/07/2021   Lab Results  Component Value Date   CREATININE 0.65 08/07/2021   BUN 12 08/07/2021   NA 141 08/07/2021   K 3.6 08/07/2021   CL 109 08/07/2021   CO2 21 (L) 08/07/2021   Lab Results  Component Value Date   ALT 34 02/15/2021   AST 26 02/15/2021   ALKPHOS 183 (H) 02/15/2021   BILITOT 1.0 02/15/2021   Lab Results  Component Value Date   CHOL 153 02/15/2021   HDL 44 02/15/2021   LDLCALC 97 02/15/2021   TRIG 60 02/15/2021   CHOLHDL 3.5 02/15/2021    Lab Results  Component Value Date   HGBA1C  6.8 (H) 02/15/2021    Review of Prior Studies: Echo 07/22: EF 25-30%, RV okay, large left pleural effusion, mild MR. Diuresed with IV lasix. Underwent thoracentesis for L pleural effusion.   R/LHC 07/22:  50% OM1, 50% OM2, 50% D2, 50% mid to distal LAD, 90% mid Lcx  treated with PCI/DES RA 6 PA 34/15 (23) PCWP 9 mmHg Fick CO 5.02 Fick CI 2.54 PA sat 75%    Assessment & Plan   1. Mixed ischemic and non-ischemic cardiomyopathy: Most recent echo in July of 2022 revealed EF of 25-30%. She is scheduled for repeat echo in Feb 2023, and has now agreed to have cardiac MRI as long as she can have an open MRI set up. She is given Xanax 1 mg X 1 for MRI anxiety.   2. Chronic Systolic CHF:  She will continue GDMT but will not have SGLT-2 inhibitor due to Type I diabetes. She has recently been started on spironolactone 12.5 mg daily by Dr. Haroldine Frederick.  I will recheck her labs in February just before her echocardiogram for follow up. Continue current regimen. NYHA Class II-III symptoms. She is given a disability parking form for card to display in her car.   3. Coronary Artery Disease: S/P DES to the mid left circumflex, on DAPT with ASA and Plavix.  Continue secondary prevention with statin therapy, metoprolol.   She is referred to [facility] stage II cardiac rehab.  She wants to talk to them about their hours as her transportation is dependent on her husbands work schedule.   4. Hyperlipidemia: Continue statin therapy with Atorvastatin 80 mg daily. Will need to keep LDL < 70 for optimal levels.   5.Type I Diabetes: Continues on insulin pump. Labs per PCP.   6. Anxiety related to health status: She has undergone a significant amount of health problems since July of 2022, which has impacted her anxiety.She will follow up with PCP for medication management and possible referral to counseling if this is something she finds potentially beneficial. Defer.   Current medicines are reviewed at length with the patient today.  I have spent 45 min's  dedicated to the care of this patient on the date of this encounter to include pre-visit review of records, assessment, management and diagnostic testing,with shared decision making. Signed, Phill Myron. West Pugh, ANP,  AACC   08/16/2021 4:41 PM    [facility] [address] Office [phone] Fax [phone]  Notice: This dictation was prepared with Dragon dictation along with smaller phrase technology. Any transcriptional errors that result from this process are unintentional and may not be corrected upon review.

## 2021-08-16 ENCOUNTER — Encounter: Payer: Self-pay | Admitting: Adult Health

## 2021-08-16 ENCOUNTER — Other Ambulatory Visit: Payer: Self-pay

## 2021-08-16 ENCOUNTER — Ambulatory Visit (INDEPENDENT_AMBULATORY_CARE_PROVIDER_SITE_OTHER): Payer: 59 | Admitting: Adult Health

## 2021-08-16 VITALS — BP 118/64 | HR 79 | Ht 66.0 in | Wt 200.6 lb

## 2021-08-16 DIAGNOSIS — I5021 Acute systolic (congestive) heart failure: Secondary | ICD-10-CM

## 2021-08-16 DIAGNOSIS — U071 COVID-19: Secondary | ICD-10-CM

## 2021-08-16 DIAGNOSIS — E1059 Type 1 diabetes mellitus with other circulatory complications: Secondary | ICD-10-CM

## 2021-08-16 DIAGNOSIS — Z794 Long term (current) use of insulin: Secondary | ICD-10-CM

## 2021-08-16 DIAGNOSIS — I255 Ischemic cardiomyopathy: Secondary | ICD-10-CM

## 2021-08-16 DIAGNOSIS — I428 Other cardiomyopathies: Secondary | ICD-10-CM

## 2021-08-16 DIAGNOSIS — I2510 Atherosclerotic heart disease of native coronary artery without angina pectoris: Secondary | ICD-10-CM

## 2021-08-16 DIAGNOSIS — E7800 Pure hypercholesterolemia, unspecified: Secondary | ICD-10-CM

## 2021-08-16 DIAGNOSIS — J90 Pleural effusion, not elsewhere classified: Secondary | ICD-10-CM

## 2021-08-16 DIAGNOSIS — F418 Other specified anxiety disorders: Secondary | ICD-10-CM

## 2021-08-16 DIAGNOSIS — E139 Other specified diabetes mellitus without complications: Secondary | ICD-10-CM

## 2021-08-16 MED ORDER — ALPRAZOLAM ER 0.5 MG PO TB24: 1.0000 mg | ORAL_TABLET | Freq: Every day | ORAL | Status: DC

## 2021-08-16 MED ORDER — ALPRAZOLAM 1 MG PO TABS: 1.0000 mg | ORAL_TABLET | Freq: Once | ORAL | 0 refills | Status: AC

## 2021-08-16 NOTE — Patient Instructions (Addendum)
Medication Instructions:  No Changes *If you need a refill on your cardiac medications before your next appointment, please call your pharmacy*   Lab Work: BMET, CBC, Magnesium : February If you have labs (blood work) drawn today and your tests are completely normal, you will receive your results only by: [place] (if you have MyChart) OR A paper copy in the mail If you have any lab test that is abnormal or we need to change your treatment, we will call you to review the results.   Testing/Procedures: [facility] , [address]. Your physician has requested that you have a cardiac MRI. Cardiac MRI uses a computer to create images of your heart as its beating, producing both still and moving pictures of your heart and major blood vessels. For further information please visit [email or web address] Please follow the instruction sheet given to you today for more information.    Follow-Up: At [facility], you and your health needs are our priority.  As part of our continuing mission to provide you with exceptional heart care, we have created designated Provider Care Teams.  These Care Teams include your primary Cardiologist (physician) and Advanced Practice Providers (APPs -  Physician Assistants and Nurse Practitioners) who all work together to provide you with the care you need, when you need it.  We recommend signing up for the patient portal called "MyChart".  Sign up information is provided on this After Visit Summary.  MyChart is used to connect with patients for Virtual Visits (Telemedicine).  Patients are able to view lab/test results, encounter notes, upcoming appointments, etc.  Non-urgent messages can be sent to your provider as well.   To learn more about what you can do with MyChart, go to [email or web address].    Your next appointment:   4 month(s)  The format for your next appointment:   In Person  Provider:    Elouise Munroe, MD     :1}

## 2021-08-28 ENCOUNTER — Other Ambulatory Visit: Payer: Self-pay | Admitting: Orthopaedic Surgery

## 2021-09-08 ENCOUNTER — Other Ambulatory Visit: Payer: Self-pay | Admitting: General Practice

## 2021-09-13 ENCOUNTER — Telehealth (HOSPITAL_COMMUNITY): Payer: Self-pay | Admitting: *Deleted

## 2021-09-13 NOTE — Telephone Encounter (Signed)
Attempted to call patient regarding upcoming cardiac MRI appointment. Left message on voicemail with name and callback number  Elleni Mozingo RN Navigator Cardiac Imaging [facility] [phone] Office [phone] Cell  

## 2021-09-14 LAB — CBC
Hematocrit: 45.2 % (ref 34.0–46.6)
Hemoglobin: 15 g/dL (ref 11.1–15.9)
MCH: 28 pg (ref 26.6–33.0)
MCHC: 33.2 g/dL (ref 31.5–35.7)
MCV: 85 fL (ref 79–97)
Platelets: 271 10*3/uL (ref 150–450)
RBC: 5.35 x10E6/uL — ABNORMAL HIGH (ref 3.77–5.28)
RDW: 12.5 % (ref 11.7–15.4)
WBC: 8.5 10*3/uL (ref 3.4–10.8)

## 2021-09-14 LAB — BASIC METABOLIC PANEL
BUN/Creatinine Ratio: 32 — ABNORMAL HIGH (ref 9–23)
BUN: 28 mg/dL — ABNORMAL HIGH (ref 6–24)
CO2: 24 mmol/L (ref 20–29)
Calcium: 9.8 mg/dL (ref 8.7–10.2)
Chloride: 104 mmol/L (ref 96–106)
Creatinine, Ser: 0.88 mg/dL (ref 0.57–1.00)
Glucose: 181 mg/dL — ABNORMAL HIGH (ref 70–99)
Potassium: 4.3 mmol/L (ref 3.5–5.2)
Sodium: 141 mmol/L (ref 134–144)
eGFR: 80 mL/min/{1.73_m2} (ref >59)

## 2021-09-14 LAB — MAGNESIUM: Magnesium: 2.2 mg/dL (ref 1.6–2.3)

## 2021-09-16 ENCOUNTER — Ambulatory Visit (HOSPITAL_COMMUNITY)
Admission: RE | Admit: 2021-09-16 | Discharge: 2021-09-16 | Disposition: A | Discharge status: Home / Self Care | Payer: 59 | Source: Ambulatory Visit | Attending: Internal Medicine | Admitting: Internal Medicine

## 2021-09-16 ENCOUNTER — Ambulatory Visit (HOSPITAL_COMMUNITY)
Admission: RE | Admit: 2021-09-16 | Discharge: 2021-09-16 | Disposition: A | Discharge status: Home / Self Care | Payer: 59 | Source: Ambulatory Visit | Attending: Adult Health | Admitting: Adult Health

## 2021-09-16 ENCOUNTER — Other Ambulatory Visit: Payer: Self-pay

## 2021-09-16 DIAGNOSIS — I509 Heart failure, unspecified: Secondary | ICD-10-CM | POA: Diagnosis present

## 2021-09-16 DIAGNOSIS — E119 Type 2 diabetes mellitus without complications: Secondary | ICD-10-CM | POA: Diagnosis not present

## 2021-09-16 DIAGNOSIS — I5042 Chronic combined systolic (congestive) and diastolic (congestive) heart failure: Secondary | ICD-10-CM

## 2021-09-16 DIAGNOSIS — I110 Hypertensive heart disease with heart failure: Secondary | ICD-10-CM | POA: Insufficient documentation

## 2021-09-16 DIAGNOSIS — E785 Hyperlipidemia, unspecified: Secondary | ICD-10-CM | POA: Insufficient documentation

## 2021-09-16 DIAGNOSIS — I428 Other cardiomyopathies: Secondary | ICD-10-CM

## 2021-09-16 DIAGNOSIS — I5021 Acute systolic (congestive) heart failure: Secondary | ICD-10-CM | POA: Diagnosis not present

## 2021-09-16 LAB — ECHOCARDIOGRAM COMPLETE
AR max vel: 2.15 cm2
AV Peak grad: 4.4 mmHg
Ao pk vel: 1.05 m/s
Area-P 1/2: 3.6 cm2
Calc EF: 33.2 %
S' Lateral: 3.9 cm
Single Plane A2C EF: 33.6 %
Single Plane A4C EF: 34.2 %

## 2021-09-16 MED ORDER — GADOBUTROL 1 MMOL/ML IV SOLN
10.0000 mL | Freq: Once | INTRAVENOUS | Status: AC | PRN
  Administered 2021-09-16: 10 mL via INTRAVENOUS

## 2021-09-16 MED ORDER — PERFLUTREN LIPID MICROSPHERE
1.0000 mL | INTRAVENOUS | Status: DC | PRN
  Administered 2021-09-16: 2 mL via INTRAVENOUS
  Filled 2021-09-16: qty 10

## 2021-09-17 ENCOUNTER — Encounter (HOSPITAL_COMMUNITY): Payer: Self-pay | Admitting: Internal Medicine

## 2021-09-17 ENCOUNTER — Other Ambulatory Visit (HOSPITAL_COMMUNITY): Payer: Self-pay

## 2021-09-17 ENCOUNTER — Ambulatory Visit (HOSPITAL_COMMUNITY)
Admission: RE | Admit: 2021-09-17 | Discharge: 2021-09-17 | Disposition: A | Discharge status: Home / Self Care | Payer: 59 | Source: Ambulatory Visit | Attending: Internal Medicine | Admitting: Internal Medicine

## 2021-09-17 VITALS — BP 144/90 | HR 93 | Wt 201.4 lb

## 2021-09-17 DIAGNOSIS — E1040 Type 1 diabetes mellitus with diabetic neuropathy, unspecified: Secondary | ICD-10-CM | POA: Insufficient documentation

## 2021-09-17 DIAGNOSIS — I5022 Chronic systolic (congestive) heart failure: Secondary | ICD-10-CM

## 2021-09-17 DIAGNOSIS — I2510 Atherosclerotic heart disease of native coronary artery without angina pectoris: Secondary | ICD-10-CM | POA: Diagnosis not present

## 2021-09-17 DIAGNOSIS — R0789 Other chest pain: Secondary | ICD-10-CM | POA: Diagnosis not present

## 2021-09-17 DIAGNOSIS — Z794 Long term (current) use of insulin: Secondary | ICD-10-CM | POA: Diagnosis not present

## 2021-09-17 DIAGNOSIS — I255 Ischemic cardiomyopathy: Secondary | ICD-10-CM | POA: Insufficient documentation

## 2021-09-17 DIAGNOSIS — Z8616 Personal history of COVID-19: Secondary | ICD-10-CM | POA: Insufficient documentation

## 2021-09-17 DIAGNOSIS — Z955 Presence of coronary angioplasty implant and graft: Secondary | ICD-10-CM | POA: Insufficient documentation

## 2021-09-17 DIAGNOSIS — Z79899 Other long term (current) drug therapy: Secondary | ICD-10-CM | POA: Diagnosis not present

## 2021-09-17 DIAGNOSIS — E785 Hyperlipidemia, unspecified: Secondary | ICD-10-CM | POA: Diagnosis not present

## 2021-09-17 DIAGNOSIS — I5042 Chronic combined systolic (congestive) and diastolic (congestive) heart failure: Secondary | ICD-10-CM

## 2021-09-17 DIAGNOSIS — I110 Hypertensive heart disease with heart failure: Secondary | ICD-10-CM | POA: Insufficient documentation

## 2021-09-17 DIAGNOSIS — R911 Solitary pulmonary nodule: Secondary | ICD-10-CM | POA: Insufficient documentation

## 2021-09-17 DIAGNOSIS — M797 Fibromyalgia: Secondary | ICD-10-CM | POA: Diagnosis not present

## 2021-09-17 DIAGNOSIS — Z9641 Presence of insulin pump (external) (internal): Secondary | ICD-10-CM | POA: Diagnosis not present

## 2021-09-17 DIAGNOSIS — Z7982 Long term (current) use of aspirin: Secondary | ICD-10-CM | POA: Insufficient documentation

## 2021-09-17 MED ORDER — SODIUM CHLORIDE 0.9% FLUSH: 3.0000 mL | Freq: Two times a day (BID) | INTRAVENOUS | Status: AC

## 2021-09-17 NOTE — H&P (View-Only) (Signed)
Advanced Heart Failure Clinic Note   Referring Physician: PCP: Nicoletta Dress, MD PCP-Cardiologist: Elouise Munroe, MD   HPI: Alieya is a 51 y.o. female with history of DM1, HTN, HLD, hx fibromyalgia, neuropathy, hx COVID-19 infection 02/2020. No cardiac history until recently.   Reports gradually progressing dyspnea with exertion for a couple of years. Felt horrible after COVID and dyspnea never really improved. Underwent ORIF for ankle fracture in June 2022. Initially did okay post-op. Subsequently developed LE edema, dyspnea and orthopnea. She was later admitted July 2022 with acute systolic CHF.   Echo 07/22: EF 25-30%, RV okay, large left pleural effusion, mild MR. Diuresed with IV lasix. Underwent thoracentesis for L pleural effusion.  R/LHC 07/22:  50% OM1, 50% OM2, 50% D2, 50% mid to distal LAD, 90% mid Lcx treated with PCI/DES RA 6 PA 34/15 (23) PCWP 9 mmHg Fick CO 5.02 Fick CI 2.54 PA sat 75%  Continued to have dyspnea at subsequent cardiology follow-ups. Brilinta switched to plavix and increased dose of furosemide for several days with some improvement in symptoms.   Echo 11/22: EF 30-35%, septal and peri-apical severe hypokinesis, RV okay, mild MR  Seen for initial HF clinic visit 08/07/21. Noted ongoing dyspnea with exertion. Volume looked okay. ReDS 28%. Started on spiro.   Echo 09/16/21: EF 35-40%, RV okay, mild MR  cMRI 09/16/21: LVEF 43%, RVEF 49%, delayed enhancement showing myocardial infarction-pattern scarring consistent with substantial LAD territory MI.  Here today for f/u. Reports ongoing dyspnea with exertion. Limited in ADLs. Has difficulty with grocery shopping and feeding her chickens. Unable to walk a block without dyspnea. Has intermittent leg swelling but improved. No significant weight gain. Notes several different types of chest pain. One type of CP is central and has been present for years. Another type is left-sided and has occurred almost  daily since around August 2022. Pain occurs randomly without relationship to exertion but seems to be exacerbated by physical activity. Lasts several minutes and resolves spontaneously.  She is compliant with medications. Home blood pressure has been controlled.    Review of Systems:  Cardiac and Respiratory - Negative except as mentioned in HPI.   Past Medical History:  Diagnosis Date   AC (acromioclavicular) joint bone spurs    Anemia    Anxiety    Arthritis    Bronchitis    Bulging lumbar disc    Bursitis    Complication of anesthesia    "has died 3 separate times under anesthesia"   Depression    Diabetes ([place])    Environmental allergies    Fibromyalgia    High blood pressure    High cholesterol    History of COVID-19 01/2020   was hospitalized   History of endometriosis    History of uterine fibroid    Migraines    Neuropathy    eyes , hands and feet    Pinched nerve    Pneumonia    Seizure (HCC)    after eye test only   Spondylosis    Tachycardia    after COVID   Tendonitis    Vision abnormalities     Current Outpatient Medications  Medication Sig Dispense Refill   aspirin 81 MG chewable tablet Chew by mouth daily.     atorvastatin (LIPITOR) 80 MG tablet Take 1 tablet (80 mg total) by mouth daily. 90 tablet 3   cholecalciferol (VITAMIN D) 1000 UNITS tablet Take 1,000 Units by mouth daily.     clopidogrel (  PLAVIX) 75 MG tablet Take 1 tablet (75 mg total) by mouth daily. 90 tablet 3   furosemide (LASIX) 20 MG tablet TAKE 2 TABLETS (40 MG TOTAL) BY MOUTH DAILY. 30 tablet 5   gabapentin (NEURONTIN) 300 MG capsule TAKE 1 CAPSULE BY MOUTH THREE TIMES A DAY 60 capsule 1   glucose blood (BAYER CONTOUR NEXT TEST) test strip Test 8-10 times per day 8-10 lancets/day 300 each 11   insulin aspart (NOVOLOG) 100 UNIT/ML injection For use in pump, total of 120 units per day 5 vial 12   Insulin Infusion Pump Supplies (PARADIGM QUICK-SET 32" 6MM) MISC 1 Device by Does not  apply route continuous. Vovolog     Magnesium 250 MG TABS Take 250 mg by mouth daily.     metoprolol succinate (TOPROL XL) 25 MG 24 hr tablet Take 1 tablet (25 mg total) by mouth 2 (two) times daily. 180 tablet 1   Multiple Vitamin (MULTIVITAMIN) capsule Take 1 capsule by mouth daily.     sacubitril-valsartan (ENTRESTO) 24-26 MG Take 1 tablet by mouth 2 (two) times daily.     spironolactone (ALDACTONE) 25 MG tablet Take 0.5 tablets (12.5 mg total) by mouth daily. 45 tablet 3   No current facility-administered medications for this encounter.    Allergies  Allergen Reactions   Fentanyl Nausea And Vomiting   Other Hives    Antibiotic specific one unsure but has tolerated antibiotic recently. Reaction was about 20 years ago.      Social History   Socioeconomic History   Marital status: Married    Spouse name: [name]   Number of children: 3   Years of education: college   Highest education level: Not on file  Occupational History    Comment: Home maker  Tobacco Use   Smoking status: Never   Smokeless tobacco: Never  Vaping Use   Vaping Use: Not on file  Substance and Sexual Activity   Alcohol use: Not Currently    Comment: special occassions   Drug use: No   Sexual activity: Not on file  Other Topics Concern   Not on file  Social History Narrative   Patient lives at home with her husband [name]. 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 Patient drinks 4-6 caffeine drinks daily.   Right handed.         Social Determinants of Health   Financial Resource Strain: Not on file  Food Insecurity: Not on file  Transportation Needs: Not on file  Physical Activity: Not on file  Stress: Not on file  Social Connections: Not on file  Intimate Partner Violence: Not on file      Family History  Problem Relation Age of Onset   Anemia Mother    High Cholesterol Mother    Diabetes Mother    Arthritis Mother     Vitals:   09/17/21 1426  BP: (!) 144/90  Pulse: 93   SpO2: 97%  Weight: 91.4 kg (201 lb 6.4 oz)     PHYSICAL EXAM: General:  Well appearing. No resp difficulty. Husband present. HEENT: normal Neck: supple. no JVD. Carotids 2+ bilat; no bruits.  Cor: PMI nondisplaced. Regular rate & rhythm. No rubs, gallops or murmurs. Lungs: clear Abdomen: soft, nontender, nondistended. No hepatosplenomegaly. No bruits or masses. Good bowel sounds. Extremities: no cyanosis, clubbing, rash, edema Neuro: alert & orientedx3, cranial nerves grossly intact. moves all 4 extremities w/o difficulty. Affect pleasant   ECG: SR 94 bpm   ASSESSMENT & PLAN:  Chronic systolic CHF/ICM: -Echo [id number]: EF 25-30%, RV okay -R/LHC 07/22: RA 6, PA mean 23, PCWP 9, Fick CI 2.54. 90% Lcx treated with PCI/DES, diffuse disease marginal branches, LAD and diagonal -Echo 11/22: EF 30-35% RV okay -Echo 09/16/21: EF 35-40%, RV okay, mild MR -cMRI 09/16/21: LVEF 43%, RVEF 49%, delayed enhancement suggestive of substantial LAD territory MI. -NYHA III. Volume looks stable. On 40 mg furosemide daily.  -Dyspnea out of proportion to degree of LV dysfunction. Volume looks stable on exam. Arranging for LHC to reassess coronaries (see below). If no significant CAD to explain symptoms, recommend stage II cardiac rehab [facility]). -Continue metoprolol succinate 25 mg daily -Continue entresto 24/26 mg BID -Continue spiro 12.5 mg daily. -No SGLT2i with Type I DM -BMET stable 02/17 -BP elevated today but has been well-controlled at home  2. CAD: -s/p PCI/DES mid Lcx 07/22.  -cMRI with delayed enhancement suggestive of LAD territory myocardial infarction. Dr. Haroldine Laws reviewed prior cath images from 07/22 personally. There is 70-80% diffuse disease in mid to distal LAD (? Event at some point with acute occlusion and subsequent recannulation) -Symptoms difficult to discern. Continues to have significant dyspnea with exertion as well as very frequent CP with typical and atypical  features. Does not recall having any CP around the time prior to PCI and did not feel any different after. Will arrange for LHC to evaluate patency Lcx stent and r/o CAD progression.  -Recommend stage II cardiac rehab as above -On DAPT with aspirin 81 mg daily + plavix 75 mg daily.  -Continue Atorvastatin 80.  3. Left pleural effusion: -s/p thoracentesis 07/22  4. Hyperlipidemia: -On high-intensity statin -Managed by primary cardiology team  5. Type I DM: -Has insulin pump -A1c 6.8 07/22  6. Pulmonary nodule: -5 mm on CTA chest 07/22 -Nonsmoker -F/u imaging per PCP  Follow-up: 6-8 weeks, sooner if needed depending on results of LHC  [facility], LINDSAY N, PA-C 09/17/21    Patient seen and examined with the above-signed Advanced Practice Provider and/or Housestaff. I personally reviewed laboratory data, imaging studies and relevant notes. I independently examined the patient and formulated the important aspects of the plan. I have edited the note to reflect any of my changes or salient points. I have personally discussed the plan with the patient and/or family.  4q y.o with CAD s/p previous stent, DM1 and fibromyalgia. Now with progressive CP with typical and atypical symptoms reminiscent of previous heart pain.   General:  Well appearing. No resp difficulty HEENT: normal Neck: supple. no JVD. Carotids 2+ bilat; no bruits. No lymphadenopathy or thryomegaly appreciated. Cor: PMI nondisplaced. Regular rate & rhythm. No rubs, gallops or murmurs. Lungs: clear Abdomen: soft, nontender, nondistended. No hepatosplenomegaly. No bruits or masses. Good bowel sounds. Extremities: no cyanosis, clubbing, rash, edema Neuro: alert & orientedx3, cranial nerves grossly intact. moves all 4 extremities w/o difficulty. Affect pleasant  Patient with progressive CP and dyspnea reminiscent of previous angina. Plan R/L cath to further evaluate. If cath stable consider cardiac rehab.   Glori Bickers,  MD  8:41 AM

## 2021-09-17 NOTE — Patient Instructions (Addendum)
Your physician recommends that you schedule a follow-up appointment in: 6 weeks  Please call office at [phone] option [phone] if you have any questions or concerns.    [facility] [address] [id number] [place] [address] Dept: [phone] Loc: [phone]  Amanda Frederick  09/17/2021  You are scheduled for a Cardiac Catheterization on Friday, February 24 with Dr. Arvilla Meres.  1. Please arrive at the [facility] (Main Entrance A) at [facility]: [address] [place], [place] [address] at 7:00am (This time is two hours before your procedure to ensure your preparation). Free valet parking service is available.   Special note: Every effort is made to have your procedure done on time. Please understand that emergencies sometimes delay scheduled procedures.  2. Diet: Do not eat or drink after midnight  3. Labs:none  4. Medication instructions in preparation for your procedure:   Please hold all medications on the morning of your procedure except for aspirin  Please contact your endocrinology doctor to discuss management of your pump for your procedure.  On the morning of your procedure, take your Aspirin and Plavix on the morning of your procedure with a small sip of water.   5. Plan for one night stay--bring personal belongings. 6. Bring a current list of your medications and current insurance cards. 7. You MUST have a responsible person to drive you home. 8. Someone MUST be with you the first 24 hours after you arrive home or your discharge will be delayed. 9. Please wear clothes that are easy to get on and off and wear slip-on shoes.  Thank you for allowing [place] to care for you!   -- [facility]

## 2021-09-17 NOTE — Progress Notes (Signed)
Advanced Heart Failure Clinic Note   Referring Physician: PCP: Nicoletta Dress, MD PCP-Cardiologist: Elouise Munroe, MD   HPI: Amanda Frederick is a 51 y.o. female with history of DM1, HTN, HLD, hx fibromyalgia, neuropathy, hx COVID-19 infection 02/2020. No cardiac history until recently.   Reports gradually progressing dyspnea with exertion for a couple of years. Felt horrible after COVID and dyspnea never really improved. Underwent ORIF for ankle fracture in June 2022. Initially did okay post-op. Subsequently developed LE edema, dyspnea and orthopnea. She was later admitted July 2022 with acute systolic CHF.   Echo 07/22: EF 25-30%, RV okay, large left pleural effusion, mild MR. Diuresed with IV lasix. Underwent thoracentesis for L pleural effusion.  R/LHC 07/22:  50% OM1, 50% OM2, 50% D2, 50% mid to distal LAD, 90% mid Lcx treated with PCI/DES RA 6 PA 34/15 (23) PCWP 9 mmHg Fick CO 5.02 Fick CI 2.54 PA sat 75%  Continued to have dyspnea at subsequent cardiology follow-ups. Brilinta switched to plavix and increased dose of furosemide for several days with some improvement in symptoms.   Echo 11/22: EF 30-35%, septal and peri-apical severe hypokinesis, RV okay, mild MR  Seen for initial HF clinic visit 08/07/21. Noted ongoing dyspnea with exertion. Volume looked okay. ReDS 28%. Started on spiro.   Echo 09/16/21: EF 35-40%, RV okay, mild MR  cMRI 09/16/21: LVEF 43%, RVEF 49%, delayed enhancement showing myocardial infarction-pattern scarring consistent with substantial LAD territory MI.  Here today for f/u. Reports ongoing dyspnea with exertion. Limited in ADLs. Has difficulty with grocery shopping and feeding her chickens. Unable to walk a block without dyspnea. Has intermittent leg swelling but improved. No significant weight gain. Notes several different types of chest pain. One type of CP is central and has been present for years. Another type is left-sided and has occurred almost  daily since around August 2022. Pain occurs randomly without relationship to exertion but seems to be exacerbated by physical activity. Lasts several minutes and resolves spontaneously.  She is compliant with medications. Home blood pressure has been controlled.    Review of Systems:  Cardiac and Respiratory - Negative except as mentioned in HPI.   Past Medical History:  Diagnosis Date   AC (acromioclavicular) joint bone spurs    Anemia    Anxiety    Arthritis    Bronchitis    Bulging lumbar disc    Bursitis    Complication of anesthesia    "has died 3 separate times under anesthesia"   Depression    Diabetes ([place])    Environmental allergies    Fibromyalgia    High blood pressure    High cholesterol    History of COVID-19 01/2020   was hospitalized   History of endometriosis    History of uterine fibroid    Migraines    Neuropathy    eyes , hands and feet    Pinched nerve    Pneumonia    Seizure (HCC)    after eye test only   Spondylosis    Tachycardia    after COVID   Tendonitis    Vision abnormalities     Current Outpatient Medications  Medication Sig Dispense Refill   aspirin 81 MG chewable tablet Chew by mouth daily.     atorvastatin (LIPITOR) 80 MG tablet Take 1 tablet (80 mg total) by mouth daily. 90 tablet 3   cholecalciferol (VITAMIN D) 1000 UNITS tablet Take 1,000 Units by mouth daily.     clopidogrel (  PLAVIX) 75 MG tablet Take 1 tablet (75 mg total) by mouth daily. 90 tablet 3   furosemide (LASIX) 20 MG tablet TAKE 2 TABLETS (40 MG TOTAL) BY MOUTH DAILY. 30 tablet 5   gabapentin (NEURONTIN) 300 MG capsule TAKE 1 CAPSULE BY MOUTH THREE TIMES A DAY 60 capsule 1   glucose blood (BAYER CONTOUR NEXT TEST) test strip Test 8-10 times per day 8-10 lancets/day 300 each 11   insulin aspart (NOVOLOG) 100 UNIT/ML injection For use in pump, total of 120 units per day 5 vial 12   Insulin Infusion Pump Supplies (PARADIGM QUICK-SET 32" 6MM) MISC 1 Device by Does not  apply route continuous. Vovolog     Magnesium 250 MG TABS Take 250 mg by mouth daily.     metoprolol succinate (TOPROL XL) 25 MG 24 hr tablet Take 1 tablet (25 mg total) by mouth 2 (two) times daily. 180 tablet 1   Multiple Vitamin (MULTIVITAMIN) capsule Take 1 capsule by mouth daily.     sacubitril-valsartan (ENTRESTO) 24-26 MG Take 1 tablet by mouth 2 (two) times daily.     spironolactone (ALDACTONE) 25 MG tablet Take 0.5 tablets (12.5 mg total) by mouth daily. 45 tablet 3   No current facility-administered medications for this encounter.    Allergies  Allergen Reactions   Fentanyl Nausea And Vomiting   Other Hives    Antibiotic specific one unsure but has tolerated antibiotic recently. Reaction was about 20 years ago.      Social History   Socioeconomic History   Marital status: Married    Spouse name: Amanda Frederick   Number of children: 3   Years of education: college   Highest education level: Not on file  Occupational History    Comment: Home maker  Tobacco Use   Smoking status: Never   Smokeless tobacco: Never  Vaping Use   Vaping Use: Not on file  Substance and Sexual Activity   Alcohol use: Not Currently    Comment: special occassions   Drug use: No   Sexual activity: Not on file  Other Topics Concern   Not on file  Social History Narrative   Patient lives at home with her husband Amanda Frederick. 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 Patient drinks 4-6 caffeine drinks daily.   Right handed.         Social Determinants of Health   Financial Resource Strain: Not on file  Food Insecurity: Not on file  Transportation Needs: Not on file  Physical Activity: Not on file  Stress: Not on file  Social Connections: Not on file  Intimate Partner Violence: Not on file      Family History  Problem Relation Age of Onset   Anemia Mother    High Cholesterol Mother    Diabetes Mother    Arthritis Mother     Vitals:   09/17/21 1426  BP: (!) 144/90  Pulse: 93   SpO2: 97%  Weight: 91.4 kg (201 lb 6.4 oz)     PHYSICAL EXAM: General:  Well appearing. No resp difficulty. Husband present. HEENT: normal Neck: supple. no JVD. Carotids 2+ bilat; no bruits.  Cor: PMI nondisplaced. Regular rate & rhythm. No rubs, gallops or murmurs. Lungs: clear Abdomen: soft, nontender, nondistended. No hepatosplenomegaly. No bruits or masses. Good bowel sounds. Extremities: no cyanosis, clubbing, rash, edema Neuro: alert & orientedx3, cranial nerves grossly intact. moves all 4 extremities w/o difficulty. Affect pleasant   ECG: SR 94 bpm   ASSESSMENT & PLAN:  Chronic systolic CHF/ICM: -Echo [id number]: EF 25-30%, RV okay -R/LHC 07/22: RA 6, PA mean 23, PCWP 9, Fick CI 2.54. 90% Lcx treated with PCI/DES, diffuse disease marginal branches, LAD and diagonal -Echo 11/22: EF 30-35% RV okay -Echo 09/16/21: EF 35-40%, RV okay, mild MR -cMRI 09/16/21: LVEF 43%, RVEF 49%, delayed enhancement suggestive of substantial LAD territory MI. -NYHA III. Volume looks stable. On 40 mg furosemide daily.  -Dyspnea out of proportion to degree of LV dysfunction. Volume looks stable on exam. Arranging for LHC to reassess coronaries (see below). If no significant CAD to explain symptoms, recommend stage II cardiac rehab [facility]). -Continue metoprolol succinate 25 mg daily -Continue entresto 24/26 mg BID -Continue spiro 12.5 mg daily. -No SGLT2i with Type I DM -BMET stable 02/17 -BP elevated today but has been well-controlled at home  2. CAD: -s/p PCI/DES mid Lcx 07/22.  -cMRI with delayed enhancement suggestive of LAD territory myocardial infarction. Dr. Haroldine Laws reviewed prior cath images from 07/22 personally. There is 70-80% diffuse disease in mid to distal LAD (? Event at some point with acute occlusion and subsequent recannulation) -Symptoms difficult to discern. Continues to have significant dyspnea with exertion as well as very frequent CP with typical and atypical  features. Does not recall having any CP around the time prior to PCI and did not feel any different after. Will arrange for LHC to evaluate patency Lcx stent and r/o CAD progression.  -Recommend stage II cardiac rehab as above -On DAPT with aspirin 81 mg daily + plavix 75 mg daily.  -Continue Atorvastatin 80.  3. Left pleural effusion: -s/p thoracentesis 07/22  4. Hyperlipidemia: -On high-intensity statin -Managed by primary cardiology team  5. Type I DM: -Has insulin pump -A1c 6.8 07/22  6. Pulmonary nodule: -5 mm on CTA chest 07/22 -Nonsmoker -F/u imaging per PCP  Follow-up: 6-8 weeks, sooner if needed depending on results of LHC  [facility], LINDSAY N, PA-C 09/17/21    Patient seen and examined with the above-signed Advanced Practice Provider and/or Housestaff. I personally reviewed laboratory data, imaging studies and relevant notes. I independently examined the patient and formulated the important aspects of the plan. I have edited the note to reflect any of my changes or salient points. I have personally discussed the plan with the patient and/or family.  4q y.o with CAD s/p previous stent, DM1 and fibromyalgia. Now with progressive CP with typical and atypical symptoms reminiscent of previous heart pain.   General:  Well appearing. No resp difficulty HEENT: normal Neck: supple. no JVD. Carotids 2+ bilat; no bruits. No lymphadenopathy or thryomegaly appreciated. Cor: PMI nondisplaced. Regular rate & rhythm. No rubs, gallops or murmurs. Lungs: clear Abdomen: soft, nontender, nondistended. No hepatosplenomegaly. No bruits or masses. Good bowel sounds. Extremities: no cyanosis, clubbing, rash, edema Neuro: alert & orientedx3, cranial nerves grossly intact. moves all 4 extremities w/o difficulty. Affect pleasant  Patient with progressive CP and dyspnea reminiscent of previous angina. Plan R/L cath to further evaluate. If cath stable consider cardiac rehab.   Glori Bickers,  MD  8:41 AM

## 2021-09-18 ENCOUNTER — Telehealth: Payer: Self-pay

## 2021-09-18 NOTE — Telephone Encounter (Addendum)
Called patient regarding results left message.----- Message from Jodelle Gross, NP sent at 09/16/2021  3:34 PM EST ----- I have reviewed her labs. They are stable. No changes in regimen.

## 2021-09-19 ENCOUNTER — Telehealth: Payer: Self-pay

## 2021-09-19 NOTE — Telephone Encounter (Addendum)
Called patient regarding results. Left message for patient to call office.----- Message from Jodelle Gross, NP sent at 09/16/2021  3:34 PM EST ----- I have reviewed her labs. They are stable. No changes in regimen.

## 2021-09-19 NOTE — Telephone Encounter (Addendum)
Called patient regarding results. Left message for patient to call office.----- Message from Jodelle Gross, NP sent at 09/16/2021  3:47 PM EST ----- She has an appointment with Dr. Gala Romney on 09/17/21 with further discussion of the MRI and the echo she had today.  Will defer to [facility] for further treatment plan.  KL

## 2021-09-20 ENCOUNTER — Encounter (HOSPITAL_COMMUNITY)
Admission: RE | Disposition: A | Discharge status: Home / Self Care | Payer: Self-pay | Source: Home / Self Care | Attending: Internal Medicine

## 2021-09-20 ENCOUNTER — Ambulatory Visit (HOSPITAL_COMMUNITY)
Admission: RE | Admit: 2021-09-20 | Discharge: 2021-09-20 | Disposition: A | Discharge status: Home / Self Care | Payer: 59 | Attending: Internal Medicine | Admitting: Internal Medicine

## 2021-09-20 ENCOUNTER — Encounter (HOSPITAL_COMMUNITY): Payer: Self-pay | Admitting: Internal Medicine

## 2021-09-20 ENCOUNTER — Other Ambulatory Visit: Payer: Self-pay

## 2021-09-20 DIAGNOSIS — Z8616 Personal history of COVID-19: Secondary | ICD-10-CM | POA: Diagnosis not present

## 2021-09-20 DIAGNOSIS — E1040 Type 1 diabetes mellitus with diabetic neuropathy, unspecified: Secondary | ICD-10-CM | POA: Diagnosis not present

## 2021-09-20 DIAGNOSIS — E7800 Pure hypercholesterolemia, unspecified: Secondary | ICD-10-CM | POA: Insufficient documentation

## 2021-09-20 DIAGNOSIS — Z7982 Long term (current) use of aspirin: Secondary | ICD-10-CM | POA: Insufficient documentation

## 2021-09-20 DIAGNOSIS — I5022 Chronic systolic (congestive) heart failure: Secondary | ICD-10-CM | POA: Insufficient documentation

## 2021-09-20 DIAGNOSIS — Z9641 Presence of insulin pump (external) (internal): Secondary | ICD-10-CM | POA: Diagnosis not present

## 2021-09-20 DIAGNOSIS — M797 Fibromyalgia: Secondary | ICD-10-CM | POA: Insufficient documentation

## 2021-09-20 DIAGNOSIS — R911 Solitary pulmonary nodule: Secondary | ICD-10-CM | POA: Diagnosis not present

## 2021-09-20 DIAGNOSIS — I255 Ischemic cardiomyopathy: Secondary | ICD-10-CM | POA: Insufficient documentation

## 2021-09-20 DIAGNOSIS — I2510 Atherosclerotic heart disease of native coronary artery without angina pectoris: Secondary | ICD-10-CM | POA: Diagnosis not present

## 2021-09-20 DIAGNOSIS — Z794 Long term (current) use of insulin: Secondary | ICD-10-CM | POA: Insufficient documentation

## 2021-09-20 DIAGNOSIS — I5042 Chronic combined systolic (congestive) and diastolic (congestive) heart failure: Secondary | ICD-10-CM

## 2021-09-20 DIAGNOSIS — J90 Pleural effusion, not elsewhere classified: Secondary | ICD-10-CM | POA: Diagnosis not present

## 2021-09-20 DIAGNOSIS — Z79899 Other long term (current) drug therapy: Secondary | ICD-10-CM | POA: Diagnosis not present

## 2021-09-20 DIAGNOSIS — Z955 Presence of coronary angioplasty implant and graft: Secondary | ICD-10-CM | POA: Insufficient documentation

## 2021-09-20 DIAGNOSIS — I25118 Atherosclerotic heart disease of native coronary artery with other forms of angina pectoris: Secondary | ICD-10-CM | POA: Diagnosis present

## 2021-09-20 DIAGNOSIS — I110 Hypertensive heart disease with heart failure: Secondary | ICD-10-CM | POA: Diagnosis not present

## 2021-09-20 DIAGNOSIS — Z7902 Long term (current) use of antithrombotics/antiplatelets: Secondary | ICD-10-CM | POA: Insufficient documentation

## 2021-09-20 HISTORY — PX: RIGHT/LEFT HEART CATH AND CORONARY ANGIOGRAPHY: CATH118266

## 2021-09-20 LAB — GLUCOSE, CAPILLARY
Glucose-Capillary: 144 mg/dL — ABNORMAL HIGH (ref 70–99)
Glucose-Capillary: 124 mg/dL — ABNORMAL HIGH (ref 70–99)

## 2021-09-20 SURGERY — RIGHT/LEFT HEART CATH AND CORONARY ANGIOGRAPHY
Anesthesia: LOCAL

## 2021-09-20 MED ORDER — LABETALOL HCL 5 MG/ML IV SOLN: 10.0000 mg | INTRAVENOUS | Status: DC | PRN

## 2021-09-20 MED ORDER — LIDOCAINE HCL (PF) 1 % IJ SOLN
INTRAMUSCULAR | Status: DC | PRN
  Administered 2021-09-20 (×3): 2 mL

## 2021-09-20 MED ORDER — SODIUM CHLORIDE 0.9 % IV SOLN: 250.0000 mL | INTRAVENOUS | Status: DC | PRN

## 2021-09-20 MED ORDER — ONDANSETRON HCL 4 MG/2ML IJ SOLN: 4.0000 mg | Freq: Four times a day (QID) | INTRAMUSCULAR | Status: DC | PRN

## 2021-09-20 MED ORDER — LIDOCAINE HCL (PF) 1 % IJ SOLN
INTRAMUSCULAR | Status: AC
  Filled 2021-09-20: qty 30

## 2021-09-20 MED ORDER — SODIUM CHLORIDE 0.9 % IV SOLN: INTRAVENOUS | Status: DC

## 2021-09-20 MED ORDER — HEPARIN (PORCINE) IN NACL 1000-0.9 UT/500ML-% IV SOLN
INTRAVENOUS | Status: DC | PRN
  Administered 2021-09-20 (×2): 500 mL

## 2021-09-20 MED ORDER — SODIUM CHLORIDE 0.9% FLUSH: 3.0000 mL | INTRAVENOUS | Status: DC | PRN

## 2021-09-20 MED ORDER — HYDRALAZINE HCL 20 MG/ML IJ SOLN: 10.0000 mg | INTRAMUSCULAR | Status: DC | PRN

## 2021-09-20 MED ORDER — HEPARIN (PORCINE) IN NACL 1000-0.9 UT/500ML-% IV SOLN
INTRAVENOUS | Status: AC
  Filled 2021-09-20: qty 1000

## 2021-09-20 MED ORDER — NITROGLYCERIN 1 MG/10 ML FOR IR/CATH LAB
INTRA_ARTERIAL | Status: DC | PRN
  Administered 2021-09-20: 100 ug via INTRACORONARY

## 2021-09-20 MED ORDER — VERAPAMIL HCL 2.5 MG/ML IV SOLN
INTRAVENOUS | Status: AC
  Filled 2021-09-20: qty 2

## 2021-09-20 MED ORDER — HEPARIN SODIUM (PORCINE) 1000 UNIT/ML IJ SOLN
INTRAMUSCULAR | Status: DC | PRN
  Administered 2021-09-20: 4000 [IU] via INTRAVENOUS

## 2021-09-20 MED ORDER — SODIUM CHLORIDE 0.9% FLUSH: 3.0000 mL | Freq: Two times a day (BID) | INTRAVENOUS | Status: DC

## 2021-09-20 MED ORDER — ACETAMINOPHEN 325 MG PO TABS: 650.0000 mg | ORAL_TABLET | ORAL | Status: DC | PRN

## 2021-09-20 MED ORDER — IOHEXOL 350 MG/ML SOLN
INTRAVENOUS | Status: DC | PRN
  Administered 2021-09-20: 45 mL

## 2021-09-20 MED ORDER — HEPARIN SODIUM (PORCINE) 1000 UNIT/ML IJ SOLN
INTRAMUSCULAR | Status: AC
  Filled 2021-09-20: qty 10

## 2021-09-20 MED ORDER — MIDAZOLAM HCL 2 MG/2ML IJ SOLN
INTRAMUSCULAR | Status: DC | PRN
  Administered 2021-09-20: 2 mg via INTRAVENOUS

## 2021-09-20 MED ORDER — VERAPAMIL HCL 2.5 MG/ML IV SOLN
INTRAVENOUS | Status: DC | PRN
  Administered 2021-09-20: 10 mL via INTRA_ARTERIAL

## 2021-09-20 MED ORDER — MIDAZOLAM HCL 2 MG/2ML IJ SOLN
INTRAMUSCULAR | Status: AC
  Filled 2021-09-20: qty 2

## 2021-09-20 MED ORDER — SODIUM CHLORIDE 0.9 % IV SOLN: INTRAVENOUS | Status: AC

## 2021-09-20 MED ORDER — NITROGLYCERIN 1 MG/10 ML FOR IR/CATH LAB
INTRA_ARTERIAL | Status: AC
  Filled 2021-09-20: qty 10

## 2021-09-20 SURGICAL SUPPLY — 11 items
CATH 5FR JL3.5 JR4 ANG PIG MP (CATHETERS) ×1 IMPLANT
CATH BALLN WEDGE 5F 110CM (CATHETERS) ×1 IMPLANT
DEVICE RAD COMP TR BAND LRG (VASCULAR PRODUCTS) ×1 IMPLANT
GLIDESHEATH SLEND SS 6F .021 (SHEATH) ×1 IMPLANT
GUIDEWIRE INQWIRE 1.5J.035X260 (WIRE) IMPLANT
INQWIRE 1.5J .035X260CM (WIRE) ×2
KIT MICROPUNCTURE NIT STIFF (SHEATH) ×1 IMPLANT
PACK CARDIAC CATHETERIZATION (CUSTOM PROCEDURE TRAY) ×2 IMPLANT
SHEATH GLIDE SLENDER 4/5FR (SHEATH) ×1 IMPLANT
SHEATH PROBE COVER 6X72 (BAG) ×1 IMPLANT
TRANSDUCER W/STOPCOCK (MISCELLANEOUS) ×2 IMPLANT

## 2021-09-20 NOTE — Interval H&P Note (Signed)
History and Physical Interval Note:  09/20/2021 8:57 AM  Amanda Frederick  has presented today for surgery, with the diagnosis of CAD/chf.  The various methods of treatment have been discussed with the patient and family. After consideration of risks, benefits and other options for treatment, the patient has consented to  Procedure(s): RIGHT/LEFT HEART CATH AND CORONARY ANGIOGRAPHY (N/A) \/ chest pressure and possible coronary angioplasty  as a surgical intervention.  The patient's history has been reviewed, patient examined, no change in status, stable for surgery.  I have reviewed the patient's chart and labs.  Questions were answered to the patient's satisfaction.     [name]

## 2021-09-23 ENCOUNTER — Telehealth (HOSPITAL_COMMUNITY): Payer: Self-pay | Admitting: *Deleted

## 2021-09-23 NOTE — Telephone Encounter (Signed)
Pt left vm c/o blisters and itchiness from adhesive used after her cath. Pt said cortisone does not help and wanted to know if there was anything else she could use.  Routed to Dr.Bensimhon

## 2021-09-24 LAB — POCT I-STAT 7, (LYTES, BLD GAS, ICA,H+H)
Acid-Base Excess: 1 mmol/L (ref 0.0–2.0)
Bicarbonate: 26.4 mmol/L (ref 20.0–28.0)
Calcium, Ion: 1.12 mmol/L — ABNORMAL LOW (ref 1.15–1.40)
HCT: 38 % (ref 36.0–46.0)
Hemoglobin: 12.9 g/dL (ref 12.0–15.0)
O2 Saturation: 100 %
Potassium: 3.6 mmol/L (ref 3.5–5.1)
Sodium: 143 mmol/L (ref 135–145)
TCO2: 28 mmol/L (ref 22–32)
pCO2 arterial: 45.1 mmHg (ref 32–48)
pH, Arterial: 7.375 (ref 7.35–7.45)
pO2, Arterial: 191 mmHg — ABNORMAL HIGH (ref 83–108)

## 2021-09-24 LAB — POCT I-STAT EG7
Acid-Base Excess: 0 mmol/L (ref 0.0–2.0)
Acid-base deficit: 4 mmol/L — ABNORMAL HIGH (ref 0.0–2.0)
Acid-base deficit: 2 mmol/L (ref 0.0–2.0)
Bicarbonate: 21.3 mmol/L (ref 20.0–28.0)
Bicarbonate: 25.6 mmol/L (ref 20.0–28.0)
Bicarbonate: 23.7 mmol/L (ref 20.0–28.0)
Calcium, Ion: 0.78 mmol/L — CL (ref 1.15–1.40)
Calcium, Ion: 1.12 mmol/L — ABNORMAL LOW (ref 1.15–1.40)
Calcium, Ion: 0.97 mmol/L — ABNORMAL LOW (ref 1.15–1.40)
HCT: 31 % — ABNORMAL LOW (ref 36.0–46.0)
HCT: 38 % (ref 36.0–46.0)
HCT: 34 % — ABNORMAL LOW (ref 36.0–46.0)
Hemoglobin: 10.5 g/dL — ABNORMAL LOW (ref 12.0–15.0)
Hemoglobin: 12.9 g/dL (ref 12.0–15.0)
Hemoglobin: 11.6 g/dL — ABNORMAL LOW (ref 12.0–15.0)
O2 Saturation: 81 %
O2 Saturation: 82 %
O2 Saturation: 78 %
Potassium: 2.6 mmol/L — CL (ref 3.5–5.1)
Potassium: 3.6 mmol/L (ref 3.5–5.1)
Potassium: 3.1 mmol/L — ABNORMAL LOW (ref 3.5–5.1)
Sodium: 143 mmol/L (ref 135–145)
Sodium: 150 mmol/L — ABNORMAL HIGH (ref 135–145)
Sodium: 146 mmol/L — ABNORMAL HIGH (ref 135–145)
TCO2: 22 mmol/L (ref 22–32)
TCO2: 27 mmol/L (ref 22–32)
TCO2: 25 mmol/L (ref 22–32)
pCO2, Ven: 38.7 mmHg — ABNORMAL LOW (ref 44–60)
pCO2, Ven: 46.2 mmHg (ref 44–60)
pCO2, Ven: 43.9 mmHg — ABNORMAL LOW (ref 44–60)
pH, Ven: 7.348 (ref 7.25–7.43)
pH, Ven: 7.352 (ref 7.25–7.43)
pH, Ven: 7.341 (ref 7.25–7.43)
pO2, Ven: 47 mmHg — ABNORMAL HIGH (ref 32–45)
pO2, Ven: 49 mmHg — ABNORMAL HIGH (ref 32–45)
pO2, Ven: 45 mmHg (ref 32–45)

## 2021-09-24 NOTE — Telephone Encounter (Signed)
Left detailed vm °

## 2021-09-25 ENCOUNTER — Telehealth: Payer: Self-pay

## 2021-09-25 NOTE — Telephone Encounter (Addendum)
Called patient regarding resuts. Left message----- Message from Jodelle Gross, NP sent at 09/16/2021  3:47 PM EST ----- ?She has an appointment with Dr. Gala Romney on 09/17/21 with further discussion of the MRI and the echo she had today.  Will defer to [facility] for further treatment plan. ? ?KL ?

## 2021-09-25 NOTE — Telephone Encounter (Addendum)
Called patient regarding results. Left message Will mail letter out to patient .----- Message from Jodelle Gross, NP sent at 09/16/2021  3:34 PM EST ----- ?I have reviewed her labs. They are stable. No changes in regimen.  ?

## 2021-11-01 ENCOUNTER — Other Ambulatory Visit: Payer: Self-pay | Admitting: General Practice

## 2021-11-05 ENCOUNTER — Ambulatory Visit (HOSPITAL_COMMUNITY)
Admission: RE | Admit: 2021-11-05 | Discharge: 2021-11-05 | Disposition: A | Discharge status: Home / Self Care | Payer: 59 | Source: Ambulatory Visit | Attending: Internal Medicine | Admitting: Internal Medicine

## 2021-11-05 ENCOUNTER — Encounter (HOSPITAL_COMMUNITY): Payer: Self-pay | Admitting: Internal Medicine

## 2021-11-05 VITALS — BP 140/80 | HR 113 | Wt 202.4 lb

## 2021-11-05 DIAGNOSIS — Z9641 Presence of insulin pump (external) (internal): Secondary | ICD-10-CM | POA: Diagnosis not present

## 2021-11-05 DIAGNOSIS — I255 Ischemic cardiomyopathy: Secondary | ICD-10-CM | POA: Diagnosis not present

## 2021-11-05 DIAGNOSIS — Z7982 Long term (current) use of aspirin: Secondary | ICD-10-CM | POA: Diagnosis not present

## 2021-11-05 DIAGNOSIS — I2510 Atherosclerotic heart disease of native coronary artery without angina pectoris: Secondary | ICD-10-CM | POA: Diagnosis not present

## 2021-11-05 DIAGNOSIS — E1040 Type 1 diabetes mellitus with diabetic neuropathy, unspecified: Secondary | ICD-10-CM | POA: Insufficient documentation

## 2021-11-05 DIAGNOSIS — E785 Hyperlipidemia, unspecified: Secondary | ICD-10-CM | POA: Diagnosis not present

## 2021-11-05 DIAGNOSIS — M797 Fibromyalgia: Secondary | ICD-10-CM | POA: Insufficient documentation

## 2021-11-05 DIAGNOSIS — Z794 Long term (current) use of insulin: Secondary | ICD-10-CM | POA: Diagnosis not present

## 2021-11-05 DIAGNOSIS — I5022 Chronic systolic (congestive) heart failure: Secondary | ICD-10-CM | POA: Diagnosis present

## 2021-11-05 DIAGNOSIS — Z955 Presence of coronary angioplasty implant and graft: Secondary | ICD-10-CM | POA: Diagnosis not present

## 2021-11-05 DIAGNOSIS — E109 Type 1 diabetes mellitus without complications: Secondary | ICD-10-CM | POA: Diagnosis not present

## 2021-11-05 DIAGNOSIS — I110 Hypertensive heart disease with heart failure: Secondary | ICD-10-CM | POA: Insufficient documentation

## 2021-11-05 DIAGNOSIS — Z79899 Other long term (current) drug therapy: Secondary | ICD-10-CM | POA: Diagnosis not present

## 2021-11-05 DIAGNOSIS — Z7902 Long term (current) use of antithrombotics/antiplatelets: Secondary | ICD-10-CM | POA: Insufficient documentation

## 2021-11-05 DIAGNOSIS — Z8616 Personal history of COVID-19: Secondary | ICD-10-CM | POA: Insufficient documentation

## 2021-11-05 DIAGNOSIS — I25118 Atherosclerotic heart disease of native coronary artery with other forms of angina pectoris: Secondary | ICD-10-CM | POA: Diagnosis not present

## 2021-11-05 DIAGNOSIS — Z833 Family history of diabetes mellitus: Secondary | ICD-10-CM | POA: Diagnosis not present

## 2021-11-05 DIAGNOSIS — I10 Essential (primary) hypertension: Secondary | ICD-10-CM | POA: Diagnosis not present

## 2021-11-05 LAB — COMPREHENSIVE METABOLIC PANEL
ALT: 27 U/L (ref 0–44)
AST: 21 U/L (ref 15–41)
Albumin: 3.7 g/dL (ref 3.5–5.0)
Alkaline Phosphatase: 64 U/L (ref 38–126)
Anion gap: 9 (ref 5–15)
BUN: 15 mg/dL (ref 6–20)
CO2: 22 mmol/L (ref 22–32)
Calcium: 9 mg/dL (ref 8.9–10.3)
Chloride: 107 mmol/L (ref 98–111)
Creatinine, Ser: 0.9 mg/dL (ref 0.44–1.00)
GFR, Estimated: >60 mL/min (ref >60)
Glucose, Bld: 187 mg/dL — ABNORMAL HIGH (ref 70–99)
Potassium: 3.4 mmol/L — ABNORMAL LOW (ref 3.5–5.1)
Sodium: 138 mmol/L (ref 135–145)
Total Bilirubin: 0.7 mg/dL (ref 0.3–1.2)
Total Protein: 6.6 g/dL (ref 6.5–8.1)

## 2021-11-05 LAB — CBC
HCT: 42.9 % (ref 36.0–46.0)
Hemoglobin: 14.3 g/dL (ref 12.0–15.0)
MCH: 27.8 pg (ref 26.0–34.0)
MCHC: 33.3 g/dL (ref 30.0–36.0)
MCV: 83.5 fL (ref 80.0–100.0)
Platelets: 266 10*3/uL (ref 150–400)
RBC: 5.14 MIL/uL — ABNORMAL HIGH (ref 3.87–5.11)
RDW: 13.1 % (ref 11.5–15.5)
WBC: 8.5 10*3/uL (ref 4.0–10.5)
nRBC: 0 % (ref 0.0–0.2)

## 2021-11-05 LAB — HEMOGLOBIN A1C
Hgb A1c MFr Bld: 8.4 % — ABNORMAL HIGH (ref 4.8–5.6)
Mean Plasma Glucose: 194.38 mg/dL

## 2021-11-05 LAB — BRAIN NATRIURETIC PEPTIDE: B Natriuretic Peptide: 52.8 pg/mL (ref 0.0–100.0)

## 2021-11-05 MED ORDER — METOPROLOL SUCCINATE ER 50 MG PO TB24: 50.0000 mg | ORAL_TABLET | Freq: Two times a day (BID) | ORAL | 3 refills | Status: DC

## 2021-11-05 NOTE — Patient Instructions (Addendum)
Medication Changes: ? ?Increase Metoprolol Succinate to 50 mg Twice daily  ? ?Lab Work: ? ?Labs done today, your results will be available in MyChart, we will contact you for abnormal readings. ? ?Testing/Procedures: ? ?Your physician has recommended that you have a cardiopulmonary stress test (CPX). CPX testing is a non-invasive measurement of heart and lung function. It replaces a traditional treadmill stress test. This type of test provides a tremendous amount of information that relates not only to your present condition but also for future outcomes. This test combines measurements of you ventilation, respiratory gas exchange in the lungs, electrocardiogram (EKG), blood pressure and physical response before, during, and following an exercise protocol. ? ?Referrals: ? ?You have been referred to Cardiac Rehab, they will call you to schedule ? ?Special Instructions // Education: ? ?Do the following things EVERYDAY: ?Weigh yourself in the morning before breakfast. Write it down and keep it in a log. ?Take your medicines as prescribed ?Eat low salt foods--Limit salt (sodium) to 2000 mg per day.  ?Stay as active as you can everyday ?Limit all fluids for the day to less than 2 liters ? ?Follow-Up in: 2-3 months ? ?At the [facility], you and your health needs are our priority. We have a designated team specialized in the treatment of Heart Failure. This Care Team includes your primary Heart Failure Specialized Cardiologist (physician), Advanced Practice Providers (APPs- Physician Assistants and Nurse Practitioners), and Pharmacist who all work together to provide you with the care you need, when you need it.  ? ?You may see any of the following providers on your designated Care Team at your next follow up: ? ?Dr Arvilla Meres ?Dr Marca Ancona ?Tonye Becket, NP ?Robbie Lis, PA ?Jessica Milford,NP ?Anna Genre, PA ?Karle Plumber, PharmD ? ? ?Please be sure to bring in all your medications  bottles to every appointment.  ? ?Need to Contact us: ? ?If you have any questions or concerns before your next appointment please send [place] a message through [facility] or call our office at [phone].   ? ?TO LEAVE A MESSAGE FOR THE NURSE SELECT OPTION 2, PLEASE LEAVE A MESSAGE INCLUDING: ?YOUR NAME ?DATE OF BIRTH ?CALL BACK NUMBER ?REASON FOR CALL**this is important as we prioritize the call backs ? ?YOU WILL RECEIVE A CALL BACK THE SAME DAY AS LONG AS YOU CALL BEFORE 4:00 PM ? ? ?

## 2021-11-05 NOTE — Progress Notes (Signed)
?[facility] Note  ? ?Referring Physician: ?PCP: Paulina Fusi, MD ?PCP-Cardiologist: Parke Poisson, MD  ? ?HPI: ?Amanda Frederick is a 51 y.o. female with history of DM1, HTN, HLD, hx fibromyalgia, neuropathy, hx COVID-19 infection 02/2020. No cardiac history until recently.  ? ?Reports gradually progressing dyspnea with exertion for a couple of years. Felt horrible after COVID and dyspnea never really improved. Underwent ORIF for ankle fracture in June 2022. Initially did okay post-op. Subsequently developed LE edema, dyspnea and orthopnea. She was later admitted July 2022 with acute systolic CHF.  ? ?Echo 07/22: EF 25-30%, RV ok, large left pleural effusion, mild MR. Diuresed with IV lasix. Underwent thoracentesis for L pleural effusion. ? ?R/LHC 07/22:  ?50% OM1, 50% OM2, 50% D2, 50% mid to distal LAD, 90% mid Lcx treated with PCI/DES ?RA 6 ?PA 34/15 (23) ?PCWP 9 mmHg ?Fick CO 5.02 ?Fick CI 2.54 ?PA sat 75% ? ?Continued to have dyspnea at subsequent cardiology follow-ups. Brilinta switched to plavix and increased dose of furosemide for several days with some improvement in symptoms.  ? ?Echo 11/22: EF 30-35%, septal and peri-apical severe hypokinesis, RV okay, mild MR ? ?Seen for initial HF clinic visit 08/07/21. Noted ongoing dyspnea with exertion. Volume looked okay. ReDS 28%. Started on spiro.  ? ?Echo 09/16/21: EF 35-40%, RV okay, mild MR ? ?cMRI 09/16/21: LVEF 43%, RVEF 49%, delayed enhancement showing myocardial infarction-pattern scarring consistent with substantial LAD territory MI. ? ?At last visit in 2/23 was having worsening CP and SOB. Repeat cath 09/20/21 showed progression of LAD disease. Now with diffuse diabetic CAD in mid to distal LAD corresponding to infarct seen on cMRI. Reviewed with IC team. No target for intervention particularly given lack of viability on MRI. EF 35-45%  ? ?Ao = 133/79 (104) ?LV = 124/15 ?RA =  1 ?RV = 30/6 ?PA = 29/8 (17) ?PCW = 10 ?Fick cardiac  output/index = 6.1/3.1 ?PVR = 0.9 WU ?Ao sat = 99% ?PA sat = 81%, 82% ?High SVC = 79% ?  ?Here for f/u. Says really no change. Still gets CP and SOB with mild activity. Has to take frequent breaks. Says she frequently has dizzy spells. No edema, orthopnea or PND. BP typically 120s/60s. Having severe HAs. Blood sugars all over the place.  ? ?Review of Systems:  Cardiac and Respiratory - Negative except as mentioned in HPI. ? ? ?Past Medical History:  ?Diagnosis Date  ? AC (acromioclavicular) joint bone spurs   ? Anemia   ? Anxiety   ? Arthritis   ? Bronchitis   ? Bulging lumbar disc   ? Bursitis   ? Complication of anesthesia   ? "has died 3 separate times under anesthesia"  ? Depression   ? Diabetes (HCC)   ? Environmental allergies   ? Fibromyalgia   ? High blood pressure   ? High cholesterol   ? History of COVID-19 01/2020  ? was hospitalized  ? History of endometriosis   ? History of uterine fibroid   ? Migraines   ? Neuropathy   ? eyes , hands and feet   ? Pinched nerve   ? Pneumonia   ? Seizure (HCC)   ? after eye test only  ? Spondylosis   ? Tachycardia   ? after COVID  ? Tendonitis   ? Vision abnormalities   ? ? ?Current Outpatient Medications  ?Medication Sig Dispense Refill  ? aspirin 81 MG chewable tablet Chew by mouth daily.    ?  atorvastatin (LIPITOR) 80 MG tablet Take 1 tablet (80 mg total) by mouth daily. 90 tablet 3  ? cholecalciferol (VITAMIN D) 1000 UNITS tablet Take 1,000 Units by mouth daily.    ? clopidogrel (PLAVIX) 75 MG tablet Take 1 tablet (75 mg total) by mouth daily. 90 tablet 3  ? furosemide (LASIX) 20 MG tablet TAKE 2 TABLETS (40 MG TOTAL) BY MOUTH DAILY. 30 tablet 5  ? glucose blood (BAYER CONTOUR NEXT TEST) test strip Test 8-10 times per day 8-10 lancets/day 300 each 11  ? insulin aspart (NOVOLOG) 100 UNIT/ML injection For use in pump, total of 120 units per day 5 vial 12  ? Insulin Infusion Pump Supplies (PARADIGM QUICK-SET 32" ) MISC 1 Device by Does not apply route continuous.  Vovolog    ? Magnesium 250 MG TABS Take 250 mg by mouth daily.    ? metoprolol succinate (TOPROL-XL) 25 MG 24 hr tablet TAKE 1 TABLET BY MOUTH TWICE A DAY 180 tablet 1  ? Multiple Vitamin (MULTIVITAMIN) capsule Take 1 capsule by mouth daily.    ? sacubitril-valsartan (ENTRESTO) 24-26 MG Take 1 tablet by mouth 2 (two) times daily.    ? spironolactone (ALDACTONE) 25 MG tablet Take 0.5 tablets (12.5 mg total) by mouth daily. 45 tablet 3  ? ?No current facility-administered medications for this encounter.  ? ?Facility-Administered Medications Ordered in Other Encounters  ?Medication Dose Route Frequency Provider Last Rate Last Admin  ? sodium chloride flush (NS) 0.9 % injection 3 mL  3 mL Intravenous Q12H Brinlee Gambrell, Bevelyn Buckles, MD      ? ? ?Allergies  ?Allergen Reactions  ? Fentanyl Nausea And Vomiting  ? Other Hives  ?  Antibiotic specific one unsure but has tolerated antibiotic recently. ?Reaction was about 20 years ago.  ? ? ?  ?Social History  ? ?Socioeconomic History  ? Marital status: Married  ?  Spouse name: Leory Plowman  ? Number of children: 3  ? Years of education: college  ? Highest education level: Not on file  ?Occupational History  ?  Comment: Home maker  ?Tobacco Use  ? Smoking status: Never  ? Smokeless tobacco: Never  ?Vaping Use  ? Vaping Use: Not on file  ?Substance and Sexual Activity  ? Alcohol use: Not Currently  ?  Comment: special occassions  ? Drug use: No  ? Sexual activity: Not on file  ?Other Topics Concern  ? Not on file  ?Social History Narrative  ? Patient lives at home with her husband Leory Plowman. 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 Patient drinks 4-6 caffeine drinks daily.  ? Right handed.  ?   ?   ? ?Social Determinants of Health  ? ?Financial Resource Strain: Not on file  ?Food Insecurity: Not on file  ?Transportation Needs: Not on file  ?Physical Activity: Not on file  ?Stress: Not on file  ?Social Connections: Not on file  ?Intimate Partner Violence: Not on file  ? ? ?  ?Family  History  ?Problem Relation Age of Onset  ? Anemia Mother   ? High Cholesterol Mother   ? Diabetes Mother   ? Arthritis Mother   ? ? ?Vitals:  ? 11/05/21 1358  ?BP: 140/80  ?Pulse: (!) 113  ?SpO2: 98%  ?Weight: 91.8 kg (202 lb 6.4 oz)  ? ? ?PHYSICAL EXAM: ?General:  Well appearing. No resp difficulty. Husband present. ?HEENT: normal ?Neck: supple. no JVD. Carotids 2+ bilat; no bruits. No lymphadenopathy or thryomegaly appreciated. ?Cor: PMI nondisplaced.  Regular tachy No rubs, gallops or murmurs. ?Lungs: clear ?Abdomen: soft, nontender, nondistended. No hepatosplenomegaly. No bruits or masses. Good bowel sounds. ?Extremities: no cyanosis, clubbing, rash, edema ?Neuro: alert & orientedx3, cranial nerves grossly intact. moves all 4 extremities w/o difficulty. Affect pleasant ? ?ECG: Sinus tach 109 No ST-T wave abnormalities. Personally reviewed ? ? ?ASSESSMENT & PLAN: ? ?Chronic systolic CHF/ICM: ?-Echo 07/22: EF 25-30%, RV okay ?-R/LHC 07/22: RA 6, PA mean 23, PCWP 9, Fick CI 2.54. 90% Lcx treated with PCI/DES, diffuse disease marginal branches, LAD and diagonal ?-Echo 11/22: EF 30-35% RV okay ?-Echo 09/16/21: EF 35-40%, RV okay, mild MR ?-cMRI 09/16/21: LVEF 43%, RVEF 49%, delayed enhancement suggestive of substantial LAD territory MI. ?- Cath 2/23 EF 35-40% showed progression of LAD disease. Now with diffuse diabetic CAD in mid to distal LAD corresponding to infarct seen on cMRI. Reviewed with IC team. Lcx stent patent No target for intervention particularly given lack of viability on MRI. RHC ok  ?- NYHA III. Volume stable. On 40 mg furosemide daily.  REDS 25% ?- Increase metoprolol succinate to 50 mg daily ?- Continue entresto 24/26 mg BID ?- Continue spiro 12.5 mg daily. ?- No SGLT2i with Type I DM ?- Symptoms seem out of proportion to objective findings. Will get CPX testing and refer to cardiac rehab ? ?2. CAD: ?-s/p PCI/DES mid Lcx 07/22.  ?-cMRI with delayed enhancement suggestive of LAD territory myocardial  infarction. Dr. Gala Romney reviewed prior cath images from 07/22 personally. There is 70-80% diffuse disease in mid to distal LAD (? Event at some point with acute occlusion and subsequent recannulation) ?- Cath

## 2021-11-05 NOTE — Progress Notes (Signed)
ReDS Vest / Clip - 11/05/21 1500   ? ?  ? ReDS Vest / Clip  ? Station Marker D   ? Ruler Value 32   ? ReDS Value Range Low volume   ? ReDS Actual Value 25   ? ?  ?  ? ?  ? ? ?

## 2021-11-08 ENCOUNTER — Telehealth (HOSPITAL_COMMUNITY): Payer: Self-pay

## 2021-11-08 MED ORDER — POTASSIUM CHLORIDE CRYS ER 20 MEQ PO TBCR: 20.0000 meq | EXTENDED_RELEASE_TABLET | Freq: Every day | ORAL | 3 refills | Status: DC

## 2021-11-08 NOTE — Telephone Encounter (Signed)
Prescription for potassium sent to pharmacy

## 2021-11-18 ENCOUNTER — Telehealth (HOSPITAL_COMMUNITY): Payer: Self-pay

## 2021-11-18 ENCOUNTER — Encounter (HOSPITAL_COMMUNITY): Payer: Self-pay

## 2021-11-18 NOTE — Telephone Encounter (Signed)
Attempted to call patient in regards to Cardiac Rehab - LM on VM Mailed letter 

## 2021-11-26 ENCOUNTER — Encounter (HOSPITAL_COMMUNITY): Payer: 59

## 2021-12-01 ENCOUNTER — Other Ambulatory Visit: Payer: Self-pay | Admitting: Internal Medicine

## 2021-12-30 ENCOUNTER — Ambulatory Visit: Payer: 59 | Admitting: Nurse Practitioner

## 2022-01-29 ENCOUNTER — Other Ambulatory Visit (HOSPITAL_COMMUNITY): Payer: Self-pay | Admitting: *Deleted

## 2022-01-29 MED ORDER — ENTRESTO 24-26 MG PO TABS
1.0000 | ORAL_TABLET | Freq: Two times a day (BID) | ORAL | 11 refills | Status: DC
Start: 2022-01-29 — End: 2022-02-11

## 2022-02-03 ENCOUNTER — Ambulatory Visit: Payer: 59 | Admitting: Orthopaedic Surgery

## 2022-02-10 ENCOUNTER — Ambulatory Visit (INDEPENDENT_AMBULATORY_CARE_PROVIDER_SITE_OTHER): Payer: 59 | Admitting: Orthopaedic Surgery

## 2022-02-10 ENCOUNTER — Ambulatory Visit (INDEPENDENT_AMBULATORY_CARE_PROVIDER_SITE_OTHER): Payer: 59

## 2022-02-10 ENCOUNTER — Encounter: Payer: Self-pay | Admitting: Orthopaedic Surgery

## 2022-02-10 DIAGNOSIS — Z8781 Personal history of (healed) traumatic fracture: Secondary | ICD-10-CM | POA: Diagnosis not present

## 2022-02-10 DIAGNOSIS — Z9889 Other specified postprocedural states: Secondary | ICD-10-CM

## 2022-02-10 NOTE — Progress Notes (Signed)
The patient is now about 13 months out from open reduction/internal fixation of a complex left bimalleolar ankle fracture.  She still reports medial and lateral pain and a tearing sensation.  She does not have good sensation in her foot overall and some of this is related to poorly controlled diabetes but also related to the injury and surgery.  On my exam today both medial lateral incisions of healed completely.  There is no evidence of infection.  There is no swelling.  There is no redness.  She has good ankle motion but I do feel sensations of popping medial and lateral.  3 views left ankle show the fracture is healed completely.  The ankle mortise is congruent and does not show gross postoperative/posttraumatic arthritis.  She would best benefit from compression socks which I do feel will help with her circulation.  I explained this to her as well.  She is still working on better blood glucose control.  3 months ago her hemoglobin A1c was over 8.  We will see her back in 3 months to see how she is doing overall but no x-rays are needed.  All questions and concerns were answered and addressed.

## 2022-02-11 ENCOUNTER — Encounter (HOSPITAL_COMMUNITY): Payer: Self-pay | Admitting: Internal Medicine

## 2022-02-11 ENCOUNTER — Ambulatory Visit (HOSPITAL_COMMUNITY)
Admission: RE | Admit: 2022-02-11 | Discharge: 2022-02-11 | Disposition: A | Discharge status: Home / Self Care | Payer: 59 | Source: Ambulatory Visit | Attending: Internal Medicine | Admitting: Internal Medicine

## 2022-02-11 VITALS — BP 124/84 | HR 74 | Wt 197.4 lb

## 2022-02-11 DIAGNOSIS — I219 Acute myocardial infarction, unspecified: Secondary | ICD-10-CM | POA: Insufficient documentation

## 2022-02-11 DIAGNOSIS — E785 Hyperlipidemia, unspecified: Secondary | ICD-10-CM | POA: Diagnosis not present

## 2022-02-11 DIAGNOSIS — I25118 Atherosclerotic heart disease of native coronary artery with other forms of angina pectoris: Secondary | ICD-10-CM | POA: Diagnosis not present

## 2022-02-11 DIAGNOSIS — Z8616 Personal history of COVID-19: Secondary | ICD-10-CM | POA: Diagnosis not present

## 2022-02-11 DIAGNOSIS — I2510 Atherosclerotic heart disease of native coronary artery without angina pectoris: Secondary | ICD-10-CM

## 2022-02-11 DIAGNOSIS — I110 Hypertensive heart disease with heart failure: Secondary | ICD-10-CM | POA: Diagnosis not present

## 2022-02-11 DIAGNOSIS — Z79899 Other long term (current) drug therapy: Secondary | ICD-10-CM | POA: Insufficient documentation

## 2022-02-11 DIAGNOSIS — Z7982 Long term (current) use of aspirin: Secondary | ICD-10-CM | POA: Diagnosis not present

## 2022-02-11 DIAGNOSIS — I252 Old myocardial infarction: Secondary | ICD-10-CM | POA: Insufficient documentation

## 2022-02-11 DIAGNOSIS — Z9641 Presence of insulin pump (external) (internal): Secondary | ICD-10-CM | POA: Diagnosis not present

## 2022-02-11 DIAGNOSIS — I5022 Chronic systolic (congestive) heart failure: Secondary | ICD-10-CM

## 2022-02-11 DIAGNOSIS — Z955 Presence of coronary angioplasty implant and graft: Secondary | ICD-10-CM | POA: Diagnosis not present

## 2022-02-11 DIAGNOSIS — Z794 Long term (current) use of insulin: Secondary | ICD-10-CM | POA: Diagnosis not present

## 2022-02-11 DIAGNOSIS — I10 Essential (primary) hypertension: Secondary | ICD-10-CM

## 2022-02-11 DIAGNOSIS — Z7902 Long term (current) use of antithrombotics/antiplatelets: Secondary | ICD-10-CM | POA: Diagnosis not present

## 2022-02-11 DIAGNOSIS — E1040 Type 1 diabetes mellitus with diabetic neuropathy, unspecified: Secondary | ICD-10-CM | POA: Diagnosis not present

## 2022-02-11 DIAGNOSIS — R0609 Other forms of dyspnea: Secondary | ICD-10-CM | POA: Insufficient documentation

## 2022-02-11 DIAGNOSIS — M797 Fibromyalgia: Secondary | ICD-10-CM | POA: Insufficient documentation

## 2022-02-11 LAB — BASIC METABOLIC PANEL
Anion gap: 10 (ref 5–15)
BUN: 16 mg/dL (ref 6–20)
CO2: 19 mmol/L — ABNORMAL LOW (ref 22–32)
Calcium: 9.2 mg/dL (ref 8.9–10.3)
Chloride: 106 mmol/L (ref 98–111)
Creatinine, Ser: 0.85 mg/dL (ref 0.44–1.00)
GFR, Estimated: >60 mL/min (ref >60)
Glucose, Bld: 386 mg/dL — ABNORMAL HIGH (ref 70–99)
Potassium: 4.3 mmol/L (ref 3.5–5.1)
Sodium: 135 mmol/L (ref 135–145)

## 2022-02-11 LAB — BRAIN NATRIURETIC PEPTIDE: B Natriuretic Peptide: 29.5 pg/mL (ref 0.0–100.0)

## 2022-02-11 MED ORDER — ENTRESTO 49-51 MG PO TABS: 1.0000 | ORAL_TABLET | Freq: Two times a day (BID) | ORAL | 6 refills | Status: DC

## 2022-02-11 MED ORDER — FUROSEMIDE 40 MG PO TABS: 40.0000 mg | ORAL_TABLET | Freq: Every day | ORAL | 6 refills | Status: DC | PRN

## 2022-02-11 NOTE — Progress Notes (Signed)
ReDS Vest / Clip - 02/11/22 1100       ReDS Vest / Clip   Station Marker D    Ruler Value 31    ReDS Value Range Low volume    ReDS Actual Value 25

## 2022-02-11 NOTE — Patient Instructions (Signed)
Medication Changes:  Increase Entresto 49/51 mg Twice daily   Take Furosemide 40 mg AS NEEDED  Lab Work:  Labs done today, your results will be available in MyChart, we will contact you for abnormal readings.  Testing/Procedures:  none  Referrals:  none  Special Instructions // Education:  Do the following things EVERYDAY: Weigh yourself in the morning before breakfast. Write it down and keep it in a log. Take your medicines as prescribed Eat low salt foods--Limit salt (sodium) to 2000 mg per day.  Stay as active as you can everyday Limit all fluids for the day to less than 2 liters   Follow-Up in: 4 months, **PLEASE CALL OUR OFFICE IN SEPTEMBER TO SCHEDULE THIS APPOINTMENT  At the [facility], you and your health needs are our priority. We have a designated team specialized in the treatment of Heart Failure. This Care Team includes your primary Heart Failure Specialized Cardiologist (physician), Advanced Practice Providers (APPs- Physician Assistants and Nurse Practitioners), and Pharmacist who all work together to provide you with the care you need, when you need it.   You may see any of the following providers on your designated Care Team at your next follow up:  Dr Arvilla Meres Dr Carron Curie, NP Robbie Lis, [place] [facility] [place], [place] Karle Plumber, PharmD   Please be sure to bring in all your medications bottles to every appointment.   Need to Contact us:  If you have any questions or concerns before your next appointment please send [place] a message through [place] [facility] or call our office at [phone].    TO LEAVE A MESSAGE FOR THE NURSE SELECT OPTION 2, PLEASE LEAVE A MESSAGE INCLUDING: YOUR NAME DATE OF BIRTH CALL BACK NUMBER REASON FOR CALL**this is important as we prioritize the call backs  YOU WILL RECEIVE A CALL BACK THE SAME DAY AS LONG AS YOU CALL BEFORE 4:00 PM

## 2022-02-11 NOTE — Progress Notes (Signed)
Advanced Heart Failure Clinic Note   Referring Physician: PCP: Paulina Fusi, MD (no longer seeing) PCP-Cardiologist: Parke Poisson, MD   HPI: Amanda Frederick is a 51 y.o. female with history of DM1, HTN, HLD, hx fibromyalgia, neuropathy, hx COVID-19 infection 02/2020. No cardiac history until recently.   Reports gradually progressing dyspnea with exertion for a couple of years. Felt horrible after COVID and dyspnea never really improved. Underwent ORIF for ankle fracture in June 2022. Initially did okay post-op. Subsequently developed LE edema, dyspnea and orthopnea. She was later admitted July 2022 with acute systolic CHF.   Echo 07/22: EF 25-30%, RV ok, large left pleural effusion, mild MR. Diuresed with IV lasix. Underwent thoracentesis for L pleural effusion.  R/LHC 07/22:  50% OM1, 50% OM2, 50% D2, 50% mid to distal LAD, 90% mid Lcx treated with PCI/DES RA 6 PA 34/15 (23) PCWP 9 mmHg Fick CO 5.02 Fick CI 2.54 PA sat 75%  Echo 11/22: EF 30-35%, septal and peri-apical severe hypokinesis, RV okay, mild MR  Seen for initial HF clinic visit 08/07/21. Noted ongoing dyspnea with exertion. Volume looked okay. ReDS 28%. Started on spiro.   Echo 09/16/21: EF 35-40%, RV okay, mild MR  cMRI 09/16/21: LVEF 43%, RVEF 49%, delayed enhancement showing myocardial infarction-pattern scarring consistent with substantial LAD territory MI.  Repeat cath 09/20/21 showed progression of LAD disease. Now with diffuse diabetic CAD in mid to distal LAD corresponding to infarct seen on cMRI. Reviewed with IC team. No target for intervention particularly given lack of viability on MRI. EF 35-45%   Ao = 133/79 (104) LV = 124/15 RA =  1 RV = 30/6 PA = 29/8 (17) PCW = 10 Fick cardiac output/index = 6.1/3.1 PVR = 0.9 WU Ao sat = 99% PA sat = 81%, 82% High SVC = 79%   Here for f/u. Can do ADLs for the most part. Struggles with carrying laundry. Has problems going to store. Mild edema. Has been off  lasix x 2 weeks because Pharmacy didn't fill. Has had severe diarrhea for 3 weeks. No PCP.   Review of Systems:  Cardiac and Respiratory - Negative except as mentioned in HPI.   Past Medical History:  Diagnosis Date   AC (acromioclavicular) joint bone spurs    Anemia    Anxiety    Arthritis    Bronchitis    Bulging lumbar disc    Bursitis    Complication of anesthesia    "has died 3 separate times under anesthesia"   Depression    Diabetes (HCC)    Environmental allergies    Fibromyalgia    High blood pressure    High cholesterol    History of COVID-19 01/2020   was hospitalized   History of endometriosis    History of uterine fibroid    Migraines    Neuropathy    eyes , hands and feet    Pinched nerve    Pneumonia    Seizure (HCC)    after eye test only   Spondylosis    Tachycardia    after COVID   Tendonitis    Vision abnormalities     Current Outpatient Medications  Medication Sig Dispense Refill   aspirin 81 MG chewable tablet Chew by mouth daily.     atorvastatin (LIPITOR) 80 MG tablet Take 1 tablet (80 mg total) by mouth daily. 90 tablet 3   cholecalciferol (VITAMIN D) 1000 UNITS tablet Take 1,000 Units by mouth daily.  clopidogrel (PLAVIX) 75 MG tablet Take 1 tablet (75 mg total) by mouth daily. 90 tablet 3   glucose blood (BAYER CONTOUR NEXT TEST) test strip Test 8-10 times per day 8-10 lancets/day 300 each 11   insulin aspart (NOVOLOG) 100 UNIT/ML injection Inject 20 Units into the skin 3 (three) times daily with meals. Units vary according to bs     Magnesium 250 MG TABS Take 250 mg by mouth daily.     metoprolol succinate (TOPROL-XL) 50 MG 24 hr tablet Take 1 tablet (50 mg total) by mouth 2 (two) times daily. 60 tablet 3   Multiple Vitamin (MULTIVITAMIN) capsule Take 1 capsule by mouth daily.     potassium chloride SA (KLOR-CON M) 20 MEQ tablet Take 1 tablet (20 mEq total) by mouth daily. 90 tablet 3   sacubitril-valsartan (ENTRESTO) 24-26 MG Take 1  tablet by mouth 2 (two) times daily. 60 tablet 11   spironolactone (ALDACTONE) 25 MG tablet Take 0.5 tablets (12.5 mg total) by mouth daily. 45 tablet 3   furosemide (LASIX) 20 MG tablet TAKE 2 TABLETS (40 MG TOTAL) BY MOUTH DAILY. (Patient not taking: Reported on 02/11/2022) 30 tablet 5   insulin aspart (NOVOLOG) 100 UNIT/ML injection For use in pump, total of 120 units per day (Patient not taking: Reported on 02/11/2022) 5 vial 12   Insulin Infusion Pump Supplies (PARADIGM QUICK-SET 32" ) MISC 1 Device by Does not apply route continuous. Vovolog (Patient not taking: Reported on 02/11/2022)     No current facility-administered medications for this encounter.   Facility-Administered Medications Ordered in Other Encounters  Medication Dose Route Frequency Provider Last Rate Last Admin   sodium chloride flush (NS) 0.9 % injection 3 mL  3 mL Intravenous Q12H Miia Blanks, Bevelyn Buckles, MD        Allergies  Allergen Reactions   Fentanyl Nausea And Vomiting   Other Hives    Antibiotic specific one unsure but has tolerated antibiotic recently. Reaction was about 20 years ago.      Social History   Socioeconomic History   Marital status: Married    Spouse name: Leory Plowman   Number of children: 3   Years of education: college   Highest education level: Not on file  Occupational History    Comment: Home maker  Tobacco Use   Smoking status: Never   Smokeless tobacco: Never  Vaping Use   Vaping Use: Not on file  Substance and Sexual Activity   Alcohol use: Not Currently    Comment: special occassions   Drug use: No   Sexual activity: Not on file  Other Topics Concern   Not on file  Social History Narrative   Patient lives at home with her husband Leory Plowman. 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 Patient drinks 4-6 caffeine drinks daily.   Right handed.         Social Determinants of Health   Financial Resource Strain: Not on file  Food Insecurity: Not on file  Transportation  Needs: Not on file  Physical Activity: Not on file  Stress: Not on file  Social Connections: Not on file  Intimate Partner Violence: Not on file      Family History  Problem Relation Age of Onset   Anemia Mother    High Cholesterol Mother    Diabetes Mother    Arthritis Mother     Vitals:   02/11/22 1111  BP: 124/84  Pulse: 74  SpO2: 97%  Weight: 89.5 kg (197  lb 6.4 oz)   Filed Weights   02/11/22 1111  Weight: 89.5 kg (197 lb 6.4 oz)     PHYSICAL EXAM: General:  Well appearing. No resp difficulty. Husband present. HEENT: normal Neck: supple. no JVD. Carotids 2+ bilat; no bruits. No lymphadenopathy or thryomegaly appreciated. Cor: PMI nondisplaced. Regular rate & rhythm. No rubs, gallops or murmurs. Lungs: clear Abdomen: obese soft, nontender, nondistended. No hepatosplenomegaly. No bruits or masses. Good bowel sounds. Extremities: no cyanosis, clubbing, rash, 1-2+ edema Neuro: alert & orientedx3, cranial nerves grossly intact. moves all 4 extremities w/o difficulty. Affect pleasant   ASSESSMENT & PLAN:  Chronic systolic CHF/ICM: -Echo 07/22: EF 25-30%, RV okay -R/LHC 07/22: RA 6, PA mean 23, PCWP 9, Fick CI 2.54. 90% Lcx treated with PCI/DES, diffuse disease marginal branches, LAD and diagonal -Echo 11/22: EF 30-35% RV okay -Echo 09/16/21: EF 35-40%, RV okay, mild MR -cMRI 09/16/21: LVEF 43%, RVEF 49%, delayed enhancement suggestive of substantial LAD territory MI. - Cath 2/23 EF 35-40% showed progression of LAD disease. Now with diffuse diabetic CAD in mid to distal LAD corresponding to infarct seen on cMRI. Reviewed with IC team. Lcx stent patent No target for intervention particularly given lack of viability on MRI. RHC ok  - Stable NYHA III. Volume looks elevated on exam but ReDS only 25% Use lasix 40 mg prn  - Continue metoprolol succinate to 50 mg daily - Increase entresto 49/51 mg BID - Continue spiro 12.5 mg daily. - No SGLT2i with Type I DM - Symptoms  seem out of proportion to objective findings. Pending CPX testing. Working on enrolling in virtual CR  2. CAD: -s/p PCI/DES mid Lcx 07/22.  -cMRI with delayed enhancement suggestive of LAD territory myocardial infarction. Dr. Gala Romney reviewed prior cath images from 07/22 personally. There is 70-80% diffuse disease in mid to distal LAD (? Event at some point with acute occlusion and subsequent recannulation) - Cath 2/23 as above. Medical therapy  -On DAPT with aspirin 81 mg daily + plavix 75 mg daily.  -Continue Atorvastatin 80. - Stable angina. No targets for PCI. Start virtual CR   3. Hyperlipidemia: -On high-intensity statin -Managed by primary cardiology team  4. Type I DM: -Has insulin pump -A1c 6.8 07/22 - needs PCP   Arvilla Meres, MD 02/11/22

## 2022-02-25 ENCOUNTER — Other Ambulatory Visit (HOSPITAL_COMMUNITY): Payer: Self-pay | Admitting: Internal Medicine

## 2022-05-12 ENCOUNTER — Ambulatory Visit: Payer: Self-pay | Admitting: Orthopaedic Surgery

## 2022-05-25 ENCOUNTER — Other Ambulatory Visit: Payer: Self-pay | Admitting: General Practice

## 2022-06-07 ENCOUNTER — Other Ambulatory Visit: Payer: Self-pay | Admitting: Internal Medicine

## 2022-06-16 ENCOUNTER — Encounter (HOSPITAL_COMMUNITY): Payer: Self-pay

## 2022-06-16 ENCOUNTER — Ambulatory Visit (HOSPITAL_COMMUNITY)
Admission: RE | Admit: 2022-06-16 | Discharge: 2022-06-16 | Disposition: A | Discharge status: Home / Self Care | Payer: Commercial Managed Care - HMO | Source: Ambulatory Visit | Attending: Family Medicine | Admitting: Family Medicine

## 2022-06-16 VITALS — BP 146/84 | HR 87 | Wt 170.8 lb

## 2022-06-16 DIAGNOSIS — Z794 Long term (current) use of insulin: Secondary | ICD-10-CM | POA: Diagnosis not present

## 2022-06-16 DIAGNOSIS — Z8616 Personal history of COVID-19: Secondary | ICD-10-CM | POA: Diagnosis not present

## 2022-06-16 DIAGNOSIS — Z79899 Other long term (current) drug therapy: Secondary | ICD-10-CM | POA: Insufficient documentation

## 2022-06-16 DIAGNOSIS — Z7902 Long term (current) use of antithrombotics/antiplatelets: Secondary | ICD-10-CM | POA: Diagnosis not present

## 2022-06-16 DIAGNOSIS — R197 Diarrhea, unspecified: Secondary | ICD-10-CM

## 2022-06-16 DIAGNOSIS — Z9641 Presence of insulin pump (external) (internal): Secondary | ICD-10-CM | POA: Insufficient documentation

## 2022-06-16 DIAGNOSIS — G4700 Insomnia, unspecified: Secondary | ICD-10-CM | POA: Insufficient documentation

## 2022-06-16 DIAGNOSIS — I25118 Atherosclerotic heart disease of native coronary artery with other forms of angina pectoris: Secondary | ICD-10-CM | POA: Diagnosis not present

## 2022-06-16 DIAGNOSIS — M797 Fibromyalgia: Secondary | ICD-10-CM | POA: Diagnosis not present

## 2022-06-16 DIAGNOSIS — I110 Hypertensive heart disease with heart failure: Secondary | ICD-10-CM | POA: Diagnosis not present

## 2022-06-16 DIAGNOSIS — E1040 Type 1 diabetes mellitus with diabetic neuropathy, unspecified: Secondary | ICD-10-CM | POA: Diagnosis not present

## 2022-06-16 DIAGNOSIS — I5022 Chronic systolic (congestive) heart failure: Secondary | ICD-10-CM | POA: Diagnosis present

## 2022-06-16 DIAGNOSIS — Z955 Presence of coronary angioplasty implant and graft: Secondary | ICD-10-CM | POA: Insufficient documentation

## 2022-06-16 DIAGNOSIS — E7800 Pure hypercholesterolemia, unspecified: Secondary | ICD-10-CM | POA: Diagnosis not present

## 2022-06-16 DIAGNOSIS — I2510 Atherosclerotic heart disease of native coronary artery without angina pectoris: Secondary | ICD-10-CM

## 2022-06-16 DIAGNOSIS — E109 Type 1 diabetes mellitus without complications: Secondary | ICD-10-CM | POA: Diagnosis not present

## 2022-06-16 DIAGNOSIS — I252 Old myocardial infarction: Secondary | ICD-10-CM | POA: Insufficient documentation

## 2022-06-16 DIAGNOSIS — R0683 Snoring: Secondary | ICD-10-CM

## 2022-06-16 DIAGNOSIS — Z7982 Long term (current) use of aspirin: Secondary | ICD-10-CM | POA: Insufficient documentation

## 2022-06-16 DIAGNOSIS — J90 Pleural effusion, not elsewhere classified: Secondary | ICD-10-CM | POA: Diagnosis not present

## 2022-06-16 LAB — COMPREHENSIVE METABOLIC PANEL
ALT: 26 U/L (ref 0–44)
AST: 24 U/L (ref 15–41)
Albumin: 4.2 g/dL (ref 3.5–5.0)
Alkaline Phosphatase: 111 U/L (ref 38–126)
Anion gap: 16 — ABNORMAL HIGH (ref 5–15)
BUN: 13 mg/dL (ref 6–20)
CO2: 22 mmol/L (ref 22–32)
Calcium: 10 mg/dL (ref 8.9–10.3)
Chloride: 100 mmol/L (ref 98–111)
Creatinine, Ser: 0.85 mg/dL (ref 0.44–1.00)
GFR, Estimated: >60 mL/min (ref >60)
Glucose, Bld: 509 mg/dL (ref 70–99)
Potassium: 5.1 mmol/L (ref 3.5–5.1)
Sodium: 138 mmol/L (ref 135–145)
Total Bilirubin: 1.5 mg/dL — ABNORMAL HIGH (ref 0.3–1.2)
Total Protein: 6.9 g/dL (ref 6.5–8.1)

## 2022-06-16 LAB — LDL CHOLESTEROL, DIRECT: Direct LDL: 73 mg/dL (ref 0–99)

## 2022-06-16 LAB — LIPID PANEL
Cholesterol: 193 mg/dL (ref 0–200)
HDL: 29 mg/dL — ABNORMAL LOW (ref >40)
LDL Cholesterol: UNDETERMINED mg/dL (ref 0–99)
Total CHOL/HDL Ratio: 6.7 RATIO
Triglycerides: 551 mg/dL — ABNORMAL HIGH (ref <150)
VLDL: UNDETERMINED mg/dL (ref 0–40)

## 2022-06-16 LAB — BRAIN NATRIURETIC PEPTIDE: B Natriuretic Peptide: 50.1 pg/mL (ref 0.0–100.0)

## 2022-06-16 LAB — AMYLASE: Amylase: 50 U/L (ref 28–100)

## 2022-06-16 MED ORDER — ISOSORBIDE MONONITRATE ER 30 MG PO TB24: 15.0000 mg | ORAL_TABLET | Freq: Every day | ORAL | 7 refills | Status: DC

## 2022-06-16 NOTE — Progress Notes (Signed)
Advanced Heart Failure Clinic Note    PCP: Paulina Fusi, MD (no longer seeing) Primary Cardiologist: Parke Poisson, MD  HF Cardiologist: Dr. Gala Romney  HPI: Amanda Frederick is a 51 y.o. female with history of DM1, HTN, HLD, hx fibromyalgia, neuropathy, hx COVID-19 infection 02/2020. No cardiac history until recently.   Reports gradually progressing dyspnea with exertion for a couple of years. Felt horrible after COVID and dyspnea never really improved. Underwent ORIF for ankle fracture in June 2022. Initially did okay post-op. Subsequently developed LE edema, dyspnea and orthopnea. She was later admitted July 2022 with acute systolic CHF.   Echo 7/22: EF 25-30%, RV ok, large left pleural effusion, mild MR. Diuresed with IV lasix. Underwent thoracentesis for L pleural effusion.  R/LHC 7/22:  50% OM1, 50% OM2, 50% D2, 50% mid to distal LAD, 90% mid Lcx treated with PCI/DES RA 6 PA 34/15 (23) PCWP 9 mmHg Fick CO 5.02 Fick CI 2.54 PA sat 75%  Echo 11/22: EF 30-35%, septal and peri-apical severe hypokinesis, RV okay, mild MR  Seen for initial HF clinic visit 08/07/21. Noted ongoing dyspnea with exertion. Volume looked okay. ReDS 28%. Started on spiro.   Echo 09/16/21: EF 35-40%, RV okay, mild MR  cMRI 09/16/21: LVEF 43%, RVEF 49%, delayed enhancement showing myocardial infarction-pattern scarring consistent with substantial LAD territory MI.  Repeat cath 09/20/21 showed progression of LAD disease. Now with diffuse diabetic CAD in mid to distal LAD corresponding to infarct seen on cMRI. Reviewed with IC team. No target for intervention particularly given lack of viability on MRI. EF 35-45%   Ao = 133/79 (104) LV = 124/15 RA =  1 RV = 30/6 PA = 29/8 (17) PCW = 10 Fick cardiac output/index = 6.1/3.1 PVR = 0.9 WU Ao sat = 99% PA sat = 81%, 82% High SVC = 79%   Follow up 7/23, stable NYHA III and volume ok. Entresto increased to 49/51. Discussed enrolling in virtual CR.  Today  she returns for HF follow up. Multiple complaints today. She is short of breath walking short distances on flat ground. [place] and SOB after feeding the chickens. Continues with diarrhea, she is insure if she has IBS, saw GI in [place] years ago. Continues with atypical chest pain, no change to pattern. Has daytime fatigue. Denies palpitations, dizziness, edema, or abnormal bleeding. Chronically sleeps reclined in adjustable bed. Appetite ok. No fever or chills. Weight at home 165 pounds. Taking all medications. Has not needed lasix recently. Husband says she snores.  Review of Systems:  Cardiac and Respiratory - Negative except as mentioned in HPI.  Past Medical History:  Diagnosis Date   AC (acromioclavicular) joint bone spurs    Anemia    Anxiety    Arthritis    Bronchitis    Bulging lumbar disc    Bursitis    Complication of anesthesia    "has died 3 separate times under anesthesia"   Depression    Diabetes (HCC)    Environmental allergies    Fibromyalgia    High blood pressure    High cholesterol    History of COVID-19 01/2020   was hospitalized   History of endometriosis    History of uterine fibroid    Migraines    Neuropathy    eyes , hands and feet    Pinched nerve    Pneumonia    Seizure (HCC)    after eye test only   Spondylosis    Tachycardia  after COVID   Tendonitis    Vision abnormalities     Current Outpatient Medications  Medication Sig Dispense Refill   aspirin 81 MG chewable tablet Chew by mouth daily.     atorvastatin (LIPITOR) 80 MG tablet Take 1 tablet (80 mg total) by mouth daily. 90 tablet 3   cholecalciferol (VITAMIN D) 1000 UNITS tablet Take 1,000 Units by mouth daily.     clopidogrel (PLAVIX) 75 MG tablet TAKE 1 TABLET BY MOUTH EVERY DAY 90 tablet 3   furosemide (LASIX) 40 MG tablet Take 1 tablet (40 mg total) by mouth daily as needed. 30 tablet 6   glucose blood (BAYER CONTOUR NEXT TEST) test strip Test 8-10 times per day 8-10  lancets/day 300 each 11   insulin aspart (NOVOLOG) 100 UNIT/ML injection Inject 20 Units into the skin 3 (three) times daily with meals. Units vary according to bs     Magnesium 250 MG TABS Take 250 mg by mouth daily.     metoprolol succinate (TOPROL-XL) 50 MG 24 hr tablet TAKE 1 TABLET BY MOUTH TWICE A DAY 180 tablet 1   Multiple Vitamin (MULTIVITAMIN) capsule Take 1 capsule by mouth daily.     potassium chloride SA (KLOR-CON M) 20 MEQ tablet Take 1 tablet (20 mEq total) by mouth daily. 90 tablet 3   sacubitril-valsartan (ENTRESTO) 49-51 MG Take 1 tablet by mouth 2 (two) times daily. 60 tablet 6   spironolactone (ALDACTONE) 25 MG tablet Take 0.5 tablets (12.5 mg total) by mouth daily. 45 tablet 3   Insulin Infusion Pump Supplies (PARADIGM QUICK-SET 32" ) MISC 1 Device by Does not apply route continuous. Vovolog (Patient not taking: Reported on 02/11/2022)     No current facility-administered medications for this encounter.   Facility-Administered Medications Ordered in Other Encounters  Medication Dose Route Frequency Provider Last Rate Last Admin   sodium chloride flush (NS) 0.9 % injection 3 mL  3 mL Intravenous Q12H Bensimhon, Bevelyn Buckles, MD       Allergies  Allergen Reactions   Fentanyl Nausea And Vomiting   Other Hives    Antibiotic specific one unsure but has tolerated antibiotic recently. Reaction was about 20 years ago.   Social History   Socioeconomic History   Marital status: Married    Spouse name: Leory Plowman   Number of children: 3   Years of education: college   Highest education level: Not on file  Occupational History    Comment: Home maker  Tobacco Use   Smoking status: Never   Smokeless tobacco: Never  Vaping Use   Vaping Use: Not on file  Substance and Sexual Activity   Alcohol use: Not Currently    Comment: special occassions   Drug use: No   Sexual activity: Not on file  Other Topics Concern   Not on file  Social History Narrative   Patient lives at home  with her husband Leory Plowman. 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 Patient drinks 4-6 caffeine drinks daily.   Right handed.         Social Determinants of Health   Financial Resource Strain: Not on file  Food Insecurity: Not on file  Transportation Needs: Not on file  Physical Activity: Not on file  Stress: Not on file  Social Connections: Not on file  Intimate Partner Violence: Not on file   Family History  Problem Relation Age of Onset   Anemia Mother    High Cholesterol Mother    Diabetes Mother  Arthritis Mother    BP (!) 146/84   Pulse 87   Wt 77.5 kg (170 lb 12.8 oz)   SpO2 96%   BMI 27.57 kg/m   Wt Readings from Last 3 Encounters:  06/16/22 77.5 kg (170 lb 12.8 oz)  02/11/22 89.5 kg (197 lb 6.4 oz)  11/05/21 91.8 kg (202 lb 6.4 oz)   PHYSICAL EXAM: General:  NAD. No resp difficulty, fatigued-appearing, walked into clinic. HEENT: Normal Neck: Supple. No JVD. Carotids 2+ bilat; no bruits. No lymphadenopathy or thryomegaly appreciated. Cor: PMI nondisplaced. Regular rate & rhythm. No rubs, gallops or murmurs. Lungs: Clear Abdomen: Soft, nontender, nondistended. No hepatosplenomegaly. No bruits or masses. Good bowel sounds. Extremities: No cyanosis, clubbing, rash, edema Neuro: Alert & oriented x 3, cranial nerves grossly intact. Moves all 4 extremities w/o difficulty. Affect pleasant.  ECG (personally reviewed): NSR 92 bpm  ASSESSMENT & PLAN: Chronic systolic CHF/ICM: - Echo (7/22): EF 25-30%, RV okay - R/LHC (7/22): RA 6, PA mean 23, PCWP 9, Fick CI 2.54. 90% Lcx treated with PCI/DES, diffuse disease marginal branches, LAD and diagonal - Echo (11/22): EF 30-35% RV okay - Echo (2/23): EF 35-40%, RV okay, mild MR - cMRI (2/23): LVEF 43%, RVEF 49%, delayed enhancement suggestive of substantial LAD territory MI. - Cath 2/23 EF 35-40% showed progression of LAD disease. Now with diffuse diabetic CAD in mid to distal LAD corresponding to infarct seen  on cMRI. Reviewed with IC team. Lcx stent patent No target for intervention particularly given lack of viability on MRI. RHC ok  - Stable NYHA III. Volume looks OK. - Continue Lasix 40 mg prn.  - Continue Toprol XL 50 mg bid - Continue Entresto 49/51 mg bid - Continue spiro 12.5 mg daily. - No SGLT2i with Type I DM - Symptoms seem out of proportion to objective findings. Needs CPX testing to quantify.   - She never heard from CR, will re-refer for virtual CR. - Labs today.  2. CAD: - s/p PCI/DES mid Lcx 07/22.  - cMRI with delayed enhancement suggestive of LAD territory myocardial infarction. Dr. Gala Romney reviewed prior cath images from 07/22 personally. There is 70-80% diffuse disease in mid to distal LAD (? Event at some point with acute occlusion and subsequent recannulation) - Cath 2/23 as above. Medical therapy  - On DAPT with aspirin 81 mg daily + plavix 75 mg daily.  - Continue atorvastatin 80. - Stable angina. No targets for PCI.  - Start Imdur 15 mg daily to see if this helps with anginal symptoms  as abnove. - Start virtual CR (she does not drive)  3. Hyperlipidemia: - On high-intensity statin - Managed by primary cardiology team - Check lipids today.  4. Type I DM: - Has insulin pump - A1c 8.4 (4/23) - Needs PCP, engage HFSW to help arrange.  5. Chronic diarrhea - also with weight loss (? Unintentional). - Check lipase, amylase and LFTs - Refer to GI.  6. Snoring - Arrange in-lab sleep study.  Will arrange CPX to quantify HF limitation. Follow up in 4 months with Dr. Gala Romney.  Anderson Malta Middleberg, FNP 06/16/22

## 2022-06-16 NOTE — Patient Instructions (Addendum)
Thank you for coming in today  Labs were done today, if any labs are abnormal the clinic will call you No news is good news  You have been referred to cardiac rehab they will contact you for further details  You have been referred to Gastrointestinal  they will contact you for further details  START Imdur 15 mg 1/2 tablet daily   Your physician recommends that you schedule a follow-up appointment in:  4 months with Dr. Gala Romney  You have been order for sleep study [facility] will call you to make appointment     Do the following things EVERYDAY: Weigh yourself in the morning before breakfast. Write it down and keep it in a log. Take your medicines as prescribed Eat low salt foods--Limit salt (sodium) to 2000 mg per day.  Stay as active as you can everyday Limit all fluids for the day to less than 2 liters  At the [facility], you and your health needs are our priority. As part of our continuing mission to provide you with exceptional heart care, we have created designated Provider Care Teams. These Care Teams include your primary Cardiologist (physician) and Advanced Practice Providers (APPs- Physician Assistants and Nurse Practitioners) who all work together to provide you with the care you need, when you need it.   You may see any of the following providers on your designated Care Team at your next follow up: Dr Arvilla Meres Dr Marca Ancona Dr. Marcos Eke, NP Robbie Lis, [place] [facility] [place], [place] Brynda Peon, NP Karle Plumber, PharmD   Please be sure to bring in all your medications bottles to every appointment.   If you have any questions or concerns before your next appointment please send [place] a message through [place] or call our office at [phone].    TO LEAVE A MESSAGE FOR THE NURSE SELECT OPTION 2, PLEASE LEAVE A MESSAGE INCLUDING: YOUR NAME DATE OF BIRTH CALL BACK NUMBER REASON FOR  CALL**this is important as we prioritize the call backs  YOU WILL RECEIVE A CALL BACK THE SAME DAY AS LONG AS YOU CALL BEFORE 4:00 PM

## 2022-06-16 NOTE — Progress Notes (Addendum)
H&V Care Navigation CSW Progress Note  Clinical Social Worker met with patient to discuss finding new PCP.  Pt was discharged from original PCP due to several providers leaving and is now looking for a new provider.  Printed list of close by in network providers and gave to pt alongside list of [facility] PCP offices in the [place] system- pt to call and establish care  SDOH Screenings   Tobacco Use: Low Risk  (06/16/2022)    Amanda Frederick, [place] [facility] Desk#: [phone] Cell#: [phone]

## 2022-06-17 ENCOUNTER — Encounter: Payer: Self-pay | Admitting: Gastroenterology

## 2022-06-18 ENCOUNTER — Encounter (HOSPITAL_COMMUNITY): Payer: Self-pay | Admitting: Cardiology

## 2022-06-25 ENCOUNTER — Ambulatory Visit (HOSPITAL_COMMUNITY)
Admission: RE | Admit: 2022-06-25 | Discharge: 2022-06-25 | Disposition: A | Discharge status: Home / Self Care | Payer: Commercial Managed Care - HMO | Source: Ambulatory Visit | Attending: Internal Medicine | Admitting: Internal Medicine

## 2022-06-25 DIAGNOSIS — I5022 Chronic systolic (congestive) heart failure: Secondary | ICD-10-CM

## 2022-06-25 LAB — LIPASE, BLOOD: Lipase: 53 U/L — ABNORMAL HIGH (ref 11–51)

## 2022-07-09 ENCOUNTER — Encounter: Payer: Self-pay | Admitting: Student

## 2022-07-09 ENCOUNTER — Other Ambulatory Visit: Payer: Self-pay | Admitting: Student

## 2022-07-09 ENCOUNTER — Encounter: Payer: Self-pay | Admitting: Dietician

## 2022-07-09 ENCOUNTER — Other Ambulatory Visit: Payer: Self-pay

## 2022-07-09 ENCOUNTER — Ambulatory Visit: Payer: Commercial Managed Care - HMO | Admitting: Student

## 2022-07-09 ENCOUNTER — Ambulatory Visit (HOSPITAL_COMMUNITY)
Admission: RE | Admit: 2022-07-09 | Discharge: 2022-07-09 | Disposition: A | Discharge status: Home / Self Care | Payer: Commercial Managed Care - HMO | Source: Ambulatory Visit | Attending: Internal Medicine | Admitting: Internal Medicine

## 2022-07-09 VITALS — BP 127/89 | HR 104 | Temp 97.6°F | Ht 66.0 in | Wt 172.4 lb

## 2022-07-09 DIAGNOSIS — E1059 Type 1 diabetes mellitus with other circulatory complications: Secondary | ICD-10-CM | POA: Diagnosis not present

## 2022-07-09 DIAGNOSIS — I2089 Other forms of angina pectoris: Secondary | ICD-10-CM

## 2022-07-09 DIAGNOSIS — F320 Major depressive disorder, single episode, mild: Secondary | ICD-10-CM

## 2022-07-09 DIAGNOSIS — I951 Orthostatic hypotension: Secondary | ICD-10-CM

## 2022-07-09 DIAGNOSIS — I5022 Chronic systolic (congestive) heart failure: Secondary | ICD-10-CM

## 2022-07-09 DIAGNOSIS — K529 Noninfective gastroenteritis and colitis, unspecified: Secondary | ICD-10-CM | POA: Diagnosis not present

## 2022-07-09 DIAGNOSIS — G4700 Insomnia, unspecified: Secondary | ICD-10-CM

## 2022-07-09 DIAGNOSIS — I2585 Chronic coronary microvascular dysfunction: Secondary | ICD-10-CM

## 2022-07-09 DIAGNOSIS — E785 Hyperlipidemia, unspecified: Secondary | ICD-10-CM

## 2022-07-09 DIAGNOSIS — M542 Cervicalgia: Secondary | ICD-10-CM

## 2022-07-09 DIAGNOSIS — Z794 Long term (current) use of insulin: Secondary | ICD-10-CM

## 2022-07-09 MED ORDER — LOPERAMIDE HCL 2 MG PO CAPS: 2.0000 mg | ORAL_CAPSULE | ORAL | 2 refills | Status: AC | PRN

## 2022-07-09 MED ORDER — T:SLIM X2 CONTROL-IQ PUMP DEVI: 1.0000 | 0 refills | Status: DC | PRN

## 2022-07-09 MED ORDER — INSULIN ASPART 100 UNIT/ML IJ SOLN: INTRAMUSCULAR | 11 refills | Status: DC

## 2022-07-09 MED ORDER — DEXCOM G7 RECEIVER DEVI: 1.0000 | 0 refills | Status: AC | PRN

## 2022-07-09 MED ORDER — DEXCOM G7 SENSOR MISC: 1.0000 | 11 refills | Status: DC

## 2022-07-09 MED ORDER — T:SLIM X2/CONTROL-IQ/ACC/INSTR MISC: 1.0000 | 0 refills | Status: DC | PRN

## 2022-07-09 MED ORDER — T:SLIM X2 3ML CARTRIDGE MISC: 1.0000 | 11 refills | Status: DC | PRN

## 2022-07-09 NOTE — Progress Notes (Signed)
EKG reviewed during visit and in normal sinus rhythm with no prolonged intervals or signs of acute ischemia.

## 2022-07-09 NOTE — Patient Instructions (Signed)
  Thank you, [name], for allowing [place] to provide your care today. Today we discussed . . .  > Diabetes       -We are going to refill your insulin pump and supplies as well as your Dexcom so we can get a better handle on your blood sugars.  Please keep watching for high and low blood sugars and if you have any bothersome symptoms or issues please do not hesitate to call our office. > Diarrhea       -Please continue with your plan to see the gastroenterologist next month and we will plan to check some stool studies.  You may also take loperamide/Imodium for diarrhea.  Please take 4 mg to start and then 2 mg after every loose bowel movement with a maximum of 16 mg a day. > Low blood pressure       -We are going to reach out to your cardiologist and let them know about your symptoms.  In the meantime please stop taking the Imdur.  If you have return of your bothersome chest pain please call our office and your cardiologist. > Insomnia       -I have attached some information to this packet about insomnia and good sleep hygiene.  You may also try melatonin again.  I would start with 5 mg before bed and if this does not work you may increase to 10 mg.  We will continue to work with you and address more at our next visit.   I have ordered the following labs for you:  Lab Orders         GI Profile, Stool, PCR- [facility]        Tests ordered today:  EKG   Referrals ordered today:   Referral Orders  No referral(s) requested today      I have ordered the following medication/changed the following medications:   Stop the following medications: There are no discontinued medications.   Start the following medications: Meds ordered this encounter  Medications   loperamide (IMODIUM) 2 MG capsule    Sig: Take 1 capsule (2 mg total) by mouth as needed for diarrhea or loose stools. Take 2 mg after every loose bowel movement with a maximum of 16 mg a day (maximum of 8 pills a day)     Dispense:  90 capsule    Refill:  2      Follow up: 4-6 weeks    Remember:     Should you have any questions or concerns please call the internal medicine clinic at [phone].     Rocky Morel, DO [facility]

## 2022-07-09 NOTE — Progress Notes (Signed)
CC: Establish care  HPI:  Amanda Frederick is a 51 y.o. female with PMH as below who presents to the clinic to establish care.  Please see encounters tab for problem-based charting.  Past Medical History:  Diagnosis Date   AC (acromioclavicular) joint bone spurs    Anemia    Anxiety    Arthritis    Bronchitis    Bulging lumbar disc    Bursitis    Complication of anesthesia    "has died 3 separate times under anesthesia"   Depression    Diabetes (HCC)    Environmental allergies    Fibromyalgia    High blood pressure    High cholesterol    History of COVID-19 01/2020   was hospitalized   History of endometriosis    History of uterine fibroid    Migraines    Neuropathy    eyes , hands and feet    Pinched nerve    Pneumonia    Seizure (HCC)    after eye test only   Spondylosis    Tachycardia    after COVID   Tendonitis    Vision abnormalities    Outpatient Medications Prior to Visit  Medication Sig Dispense Refill   aspirin 81 MG chewable tablet Chew by mouth daily.     atorvastatin (LIPITOR) 80 MG tablet Take 1 tablet (80 mg total) by mouth daily. 90 tablet 3   cholecalciferol (VITAMIN D) 1000 UNITS tablet Take 1,000 Units by mouth daily.     clopidogrel (PLAVIX) 75 MG tablet TAKE 1 TABLET BY MOUTH EVERY DAY 90 tablet 3   furosemide (LASIX) 40 MG tablet Take 1 tablet (40 mg total) by mouth daily as needed. 30 tablet 6   glucose blood (BAYER CONTOUR NEXT TEST) test strip Test 8-10 times per day 8-10 lancets/day 300 each 11   Insulin Infusion Pump Supplies (PARADIGM QUICK-SET 32" ) MISC 1 Device by Does not apply route continuous. Vovolog (Patient not taking: Reported on 02/11/2022)     isosorbide mononitrate (IMDUR) 30 MG 24 hr tablet Take 0.5 tablets (15 mg total) by mouth daily. 15 tablet 7   Magnesium 250 MG TABS Take 250 mg by mouth daily.     metoprolol succinate (TOPROL-XL) 50 MG 24 hr tablet TAKE 1 TABLET BY MOUTH TWICE A DAY 180 tablet 1   Multiple Vitamin  (MULTIVITAMIN) capsule Take 1 capsule by mouth daily.     potassium chloride SA (KLOR-CON M) 20 MEQ tablet Take 1 tablet (20 mEq total) by mouth daily. 90 tablet 3   sacubitril-valsartan (ENTRESTO) 49-51 MG Take 1 tablet by mouth 2 (two) times daily. 60 tablet 6   spironolactone (ALDACTONE) 25 MG tablet Take 0.5 tablets (12.5 mg total) by mouth daily. 45 tablet 3   insulin aspart (NOVOLOG) 100 UNIT/ML injection Inject 20 Units into the skin 3 (three) times daily with meals. Units vary according to bs     Facility-Administered Medications Prior to Visit  Medication Dose Route Frequency Provider Last Rate Last Admin   sodium chloride flush (NS) 0.9 % injection 3 mL  3 mL Intravenous Q12H Bensimhon, Bevelyn Buckles, MD        Past Surgical History:  Procedure Laterality Date   CORONARY STENT INTERVENTION N/A 02/19/2021   Procedure: CORONARY STENT INTERVENTION;  Surgeon: Corky Crafts, MD;  Location: [facility] INVASIVE CV LAB;  Service: Cardiovascular;  Laterality: N/A;   NASAL SINUS SURGERY     ORIF ANKLE FRACTURE Left 01/04/2021  Procedure: OPEN REDUCTION INTERNAL FIXATION (ORIF) LEFT BIMALLEOLAR ANKLE FRACTURE;  Surgeon: Kathryne Hitch, MD;  Location: [facility] ORS;  Service: Orthopedics;  Laterality: Left;   RIGHT/LEFT HEART CATH AND CORONARY ANGIOGRAPHY N/A 02/19/2021   Procedure: RIGHT/LEFT HEART CATH AND CORONARY ANGIOGRAPHY;  Surgeon: Corky Crafts, MD;  Location: [facility] INVASIVE CV LAB;  Service: Cardiovascular;  Laterality: N/A;   RIGHT/LEFT HEART CATH AND CORONARY ANGIOGRAPHY N/A 09/20/2021   Procedure: RIGHT/LEFT HEART CATH AND CORONARY ANGIOGRAPHY;  Surgeon: Dolores Patty, MD;  Location: [facility] INVASIVE CV LAB;  Service: Cardiovascular;  Laterality: N/A;   TUBAL LIGATION     VAGINAL HYSTERECTOMY     Allergies  Allergen Reactions   Fentanyl Nausea And Vomiting   Other Hives    Antibiotic specific one unsure but has tolerated antibiotic recently. Reaction was about 20 years ago.    Family History  Problem Relation Age of Onset   Anemia Mother    High Cholesterol Mother    Diabetes Mother    Arthritis Mother    Diabetes type I Maternal Grandmother    Social History   Tobacco Use   Smoking status: Never   Smokeless tobacco: Never  Substance Use Topics   Alcohol use: Not Currently    Comment: special occassions   Drug use: No     Review of Systems:   Positive for cervical neck pain with radiculopathy and weakness in her arms and hands, diarrhea, crampy abdominal pain, intermittent headache, intermittent dizziness. Negative for syncope, falls, melena, hematochezia, cough, shortness of breath, fever.  Physical Exam:  Vitals:   07/09/22 1021 07/09/22 1213 07/09/22 1214 07/09/22 1215  BP: 116/69 (!) 135/97 128/86 127/89  Pulse: 90 85 91 (!) 104  Temp: 97.6 F (36.4 C)     TempSrc: Oral     SpO2: 100%     Weight: 172 lb 6.4 oz (78.2 kg)     Height: 5\' 6"  (1.676 m)       Constitutional: Middle-age female who appears older than stated age. In no acute distress. HENT: Normocephalic, atraumatic,  Eyes: Sclera non-icteric, EOM intact Neck: Tenderness to palpation over the cervical paraspinal musculature with limited range of motion of the neck secondary to reproduced radicular pain.  No obvious instability and no masses. Cardio:Regular rate and rhythm.  2+ bilateral radial pulses. Pulm:Clear to auscultation bilaterally. Normal work of breathing on room air. Abdomen: Soft, non-tender, non-distended, positive bowel sounds. for extremity edema. Skin:Warm and dry. Neuro:Alert and oriented x3.  4/5 strength and mildly decreased sensation in bilateral upper extremities. Psych:Pleasant mood and affect.   Assessment & Plan:   DM (diabetes mellitus), type 1 (HCC) Patient with a history of type 1 diabetes and has previously been on an insulin pump (tslim X2) with Dexcom sensor and NovoLog for insulin.  Unfortunately her doctor who was managing  her diabetes retired earlier this year and did not help her get a new pump or sensor.  For the past several months she has been injecting NovoLog 3-4 times a day up to 150 units daily.  She has had some very high sugars recently with level on recent BMP of over 500 and readings on her glucometer at home above its limit.  She states that she gets headaches, dizziness, and some sweats sometimes with the high sugars that resolved after insulin administration.  She denies any consistent lows. - Refill Tslim X2 insulin pump, Dexcom G7, and NovoLog for insulin pump  Coronary artery disease As in  the overview patient is status post LCx stent in 01/2021 and repeat heart cath in 08/2021 with 70 to 80% obstruction of the mid to distal LAD without needed intervention.  Currently on DAPT and statin.  Recently started on Imdur for stable angina and having orthostatic symptoms.  Orthostatic vitals normal in the office.  We will have her stop the Imdur and communicate this to cardiology.  She is supposed to follow-up with them in about 4 months but may need to do this sooner. - Continue aspirin, Plavix, atorvastatin 80 mg - Hold Imdur in the setting of orthostatic symptoms (this was discussed with the heart failure clinic and they will call the patient to bring her in sooner for further evaluation and medicine reconciliation)  HLD (hyperlipidemia) Currently on high intensity statin with lipids last checked on 06/16/2022 with triglycerides of 551, HDL of 29 and unable to calculate LDL.  No complaints of epigastric abdominal pain or back pain consistent with pancreatitis around that time. - Continue atorvastatin 80 mg daily  Insomnia Patient noted to have difficulty falling asleep and staying asleep which she has taken Lunesta for in the past with benefit which was years ago.  She was referred for an in lab sleep study recently.  After discussing with her and her husband we do not think that she needs this study urgently.   She does not have issues with snoring or apneic spells during the night at least reported by the husband.  Most of her issues are with falling asleep and staying asleep.  We will continue to work on this and if recommended she try melatonin as well as good sleep hygiene.  Depression Patient had a PHQ 9 score of 14 without any thoughts of self-harm or passive death wish.  Unfortunately due to the complexity of her other medical problems and the limited clinic time we were unable to address this today.  Plan to discuss this at future visits and will continue to have her fill out PHQ-9's to note any trends.  Neck pain Patient has a history of a cervical neck injury with imaging showing multilevel degenerative changes with advanced disease at C5/C6 and C6/C7 with associated facet degenerative changes at multiple levels and foraminal stenosis.  She has stable but worrisome cervical neck pain with radiculopathy and weakness.  She has previously been offered surgical intervention for this but was weary about the potential poor outcomes.  We discussed our concerns about the symptoms and the risks of waiting too long to address them including decreased likelihood of good outcomes after surgical intervention or other intervention.  She would like to wait for now on referral to neurosurgery.  We gave her strict return precautions and we have a low threshold for reimaging and neurosurgery referral.  Orthostatic hypotension Patient reports recent low blood pressures at home as low as 72/60 with a heart rate of 110 that primarily occurred after getting up from a seated position.  She has had no syncope or falls.  She was recently started on Imdur for stable angina by her cardiologist.  Orthostatic vital signs in our office were negative. - Hold Imdur, cardiologist office informed and will have her come back soon for reevaluation - Return precautions and preventative measures discussed  Chronic diarrhea Patient  notes a long history of bouts of chronic diarrhea that has been fully worked up in the past with EGD and colonoscopy that reportedly showed no issues.  She was told she likely has IBS.  Her  most recent bout of diarrhea started 7 months ago and has not improved.  She has associated crampy abdominal pain and symptoms are similar to her previous episodes of diarrhea.  No melena or hematochezia.  Weight trends show a 30 pound weight loss over the last 8 months from 202 pounds to 172 pounds.  She recently had labs drawn on 06/16/2022 which showed a slightly elevated lipase of 53, normal amylase, slightly elevated total bilirubin at 1.5, blood glucose of 509, bicarb of 22, anion gap of 16, otherwise normal CMP.  No stool studies have been done. - Loperamide as needed and GI panel ordered  Heart failure with improved ejection fraction (HFimpEF) (HCC) Patient continues to follow-up with heart failure clinic and is on GDMT without SGLT2 due to her type 1 diabetes.  Overall she is doing well from a heart failure standpoint without return of lower extremity edema or dyspnea recently. - Continue to follow-up with heart failure - Continue GDMT with Lasix 40 mg as needed, Toprol 50 mg twice daily, Entresto 100 mg twice daily, spironolactone 12.5 mg daily    Patient seen with Dr. Quinn Plowman, DO [facility] Resident PGY-1 Pager: [phone]

## 2022-07-10 ENCOUNTER — Telehealth: Payer: Self-pay

## 2022-07-10 NOTE — Telephone Encounter (Signed)
Spoke to pharmacist, pharmacy does not have insulin pumps due to price being high

## 2022-07-10 NOTE — Telephone Encounter (Signed)
Prior Authorization for patient (Dexcom G7 Reciever) came through on cover my meds was submitted awaiting approval or denial

## 2022-07-10 NOTE — Telephone Encounter (Addendum)
Prior Authorization for patient Patent examiner) came through on cover my meds per Lauren patient is allergic to Novolog unable to submit prior authorization.

## 2022-07-10 NOTE — Telephone Encounter (Signed)
Patient called in stating her insurance will cover Humalog but not Novolog. Patient states she is allergic to Humalog. The first time she tried it she developed itching and red bumps at injection site. It spread to entire abdomen, around sides, and to top of both legs. She removed Omnipod and has used Novolog since.

## 2022-07-11 ENCOUNTER — Telehealth (HOSPITAL_COMMUNITY): Payer: Self-pay | Admitting: *Deleted

## 2022-07-11 ENCOUNTER — Encounter: Payer: Self-pay | Admitting: Student

## 2022-07-11 DIAGNOSIS — I951 Orthostatic hypotension: Secondary | ICD-10-CM | POA: Insufficient documentation

## 2022-07-11 DIAGNOSIS — I2510 Atherosclerotic heart disease of native coronary artery without angina pectoris: Secondary | ICD-10-CM | POA: Insufficient documentation

## 2022-07-11 DIAGNOSIS — K529 Noninfective gastroenteritis and colitis, unspecified: Secondary | ICD-10-CM | POA: Insufficient documentation

## 2022-07-11 NOTE — Progress Notes (Signed)
Internal Medicine Clinic Attending  I saw and evaluated the patient.  I personally confirmed the key portions of the history and exam documented by Dr. Goodwin and I reviewed pertinent patient test results.  The assessment, diagnosis, and plan were formulated together and I agree with the documentation in the resident's note.  

## 2022-07-11 NOTE — Telephone Encounter (Signed)
Decision:Approved Effective: 07/10/2022-07/10/2023

## 2022-07-11 NOTE — Assessment & Plan Note (Signed)
Patient has a history of a cervical neck injury with imaging showing multilevel degenerative changes with advanced disease at C5/C6 and C6/C7 with associated facet degenerative changes at multiple levels and foraminal stenosis.  She has stable but worrisome cervical neck pain with radiculopathy and weakness.  She has previously been offered surgical intervention for this but was weary about the potential poor outcomes.  We discussed our concerns about the symptoms and the risks of waiting too long to address them including decreased likelihood of good outcomes after surgical intervention or other intervention.  She would like to wait for now on referral to neurosurgery.  We gave her strict return precautions and we have a low threshold for reimaging and neurosurgery referral.

## 2022-07-11 NOTE — Assessment & Plan Note (Signed)
Patient continues to follow-up with heart failure clinic and is on GDMT without SGLT2 due to her type 1 diabetes.  Overall she is doing well from a heart failure standpoint without return of lower extremity edema or dyspnea recently. - Continue to follow-up with heart failure - Continue GDMT with Lasix 40 mg as needed, Toprol 50 mg twice daily, Entresto 100 mg twice daily, spironolactone 12.5 mg daily

## 2022-07-11 NOTE — Assessment & Plan Note (Addendum)
Patient reports recent low blood pressures at home as low as 72/60 with a heart rate of 110 that primarily occurred after getting up from a seated position.  She has had no syncope or falls.  She was recently started on Imdur for stable angina by her cardiologist.  Orthostatic vital signs in our office were negative. - Hold Imdur, cardiologist office informed and will have her come back soon for reevaluation - Return precautions and preventative measures discussed

## 2022-07-11 NOTE — Assessment & Plan Note (Addendum)
As in the overview patient is status post LCx stent in 01/2021 and repeat heart cath in 08/2021 with 70 to 80% obstruction of the mid to distal LAD without needed intervention.  Currently on DAPT and statin.  Recently started on Imdur for stable angina and having orthostatic symptoms.  Orthostatic vitals normal in the office.  We will have her stop the Imdur and communicate this to cardiology.  She is supposed to follow-up with them in about 4 months but may need to do this sooner. - Continue aspirin, Plavix, atorvastatin 80 mg - Hold Imdur in the setting of orthostatic symptoms (this was discussed with the heart failure clinic and they will call the patient to bring her in sooner for further evaluation and medicine reconciliation)

## 2022-07-11 NOTE — Assessment & Plan Note (Deleted)
Currently on high intensity statin with lipids last checked on 06/16/2022 with triglycerides of 551, HDL of 29 and unable to calculate LDL.  No complaints of epigastric abdominal pain or back pain consistent with pancreatitis around that time. - Continue atorvastatin 80 mg daily

## 2022-07-11 NOTE — Addendum Note (Signed)
Addended by: Earl Lagos on: 07/11/2022 11:17 AM   Modules accepted: Level of Service

## 2022-07-11 NOTE — Assessment & Plan Note (Signed)
Patient notes a long history of bouts of chronic diarrhea that has been fully worked up in the past with EGD and colonoscopy that reportedly showed no issues.  She was told she likely has IBS.  Her most recent bout of diarrhea started 7 months ago and has not improved.  She has associated crampy abdominal pain and symptoms are similar to her previous episodes of diarrhea.  No melena or hematochezia.  Weight trends show a 30 pound weight loss over the last 8 months from 202 pounds to 172 pounds.  She recently had labs drawn on 06/16/2022 which showed a slightly elevated lipase of 53, normal amylase, slightly elevated total bilirubin at 1.5, blood glucose of 509, bicarb of 22, anion gap of 16, otherwise normal CMP.  No stool studies have been done. - Loperamide as needed and GI panel ordered

## 2022-07-11 NOTE — Assessment & Plan Note (Addendum)
Patient noted to have difficulty falling asleep and staying asleep which she has taken Lunesta for in the past with benefit which was years ago.  She was referred for an in lab sleep study recently.  After discussing with her and her husband we do not think that she needs this study urgently.  She does not have issues with snoring or apneic spells during the night at least reported by the husband.  Most of her issues are with falling asleep and staying asleep.  We will continue to work on this and if recommended she try melatonin as well as good sleep hygiene.

## 2022-07-11 NOTE — Assessment & Plan Note (Signed)
Patient with a history of type 1 diabetes and has previously been on an insulin pump (tslim X2) with Dexcom sensor and NovoLog for insulin.  Unfortunately her doctor who was managing her diabetes retired earlier this year and did not help her get a new pump or sensor.  For the past several months she has been injecting NovoLog 3-4 times a day up to 150 units daily.  She has had some very high sugars recently with level on recent BMP of over 500 and readings on her glucometer at home above its limit.  She states that she gets headaches, dizziness, and some sweats sometimes with the high sugars that resolved after insulin administration.  She denies any consistent lows. - Refill Tslim X2 insulin pump, Dexcom G7, and NovoLog for insulin pump

## 2022-07-11 NOTE — Telephone Encounter (Signed)
Dr.Goodwin with internal medicine called stating he had to hold one of pts cardiac meds and requested a call back from a provider to discuss.  Call back # [phone]

## 2022-07-11 NOTE — Assessment & Plan Note (Signed)
Currently on high intensity statin with lipids last checked on 06/16/2022 with triglycerides of 551, HDL of 29 and unable to calculate LDL.  No complaints of epigastric abdominal pain or back pain consistent with pancreatitis around that time. - Continue atorvastatin 80 mg daily 

## 2022-07-11 NOTE — Assessment & Plan Note (Signed)
Patient had a PHQ 9 score of 14 without any thoughts of self-harm or passive death wish.  Unfortunately due to the complexity of her other medical problems and the limited clinic time we were unable to address this today.  Plan to discuss this at future visits and will continue to have her fill out PHQ-9's to note any trends.

## 2022-07-14 ENCOUNTER — Telehealth: Payer: Self-pay | Admitting: *Deleted

## 2022-07-14 NOTE — Progress Notes (Signed)
  Care Coordination   Note   07/14/2022 Name: Joanmarie Tsang MRN: [id number] DOB: Jun 06, 1971  Quiara Killian is a 51 y.o. year old female who sees Rocky Morel, DO for primary care. I reached out to Lesli Albee by phone today to offer care coordination services.  Ms. Derocher was given information about Care Coordination services today including:   The Care Coordination services include support from the care team which includes your Nurse Coordinator, Clinical Social Worker, or Pharmacist.  The Care Coordination team is here to help remove barriers to the health concerns and goals most important to you. Care Coordination services are voluntary, and the patient may decline or stop services at any time by request to their care team member.   Care Coordination Consent Status: Patient agreed to services and verbal consent obtained.   Follow up plan:  Telephone appointment with care coordination team member scheduled for:  07/22/22  Encounter Outcome:  Pt. Scheduled [facility] Coordination Care Guide  Direct Dial: [phone]

## 2022-07-14 NOTE — Progress Notes (Signed)
  Care Coordination Note  07/14/2022 Name: Amanda Frederick MRN: [id number] DOB: 1970/12/17  [name] is a 51 y.o. year old female who is a primary care patient of Rocky Morel, DO and is actively engaged with the care management team. I reached out to Lesli Albee by phone today to assist with re-scheduling an initial visit with the Licensed Clinical Social Worker  Follow up plan: Unsuccessful telephone outreach attempt made. A HIPAA compliant phone message was left for the patient providing contact information and requesting a return call.   [facility]  Care Coordination Care Guide  Direct Dial: [phone]

## 2022-07-14 NOTE — Progress Notes (Signed)
  Care Coordination Note  07/14/2022 Name: Chania Kochanski MRN: [id number] DOB: 01-25-1971  Cheris Tweten is a 51 y.o. year old female who is a primary care patient of Rocky Morel, DO and is actively engaged with the care management team. I reached out to Lesli Albee by phone today to assist with re-scheduling an initial visit with the Licensed Clinical Social Worker  Follow up plan: Telephone appointment with care management team member scheduled for:07/29/22  [facility]  Care Coordination Care Guide  Direct Dial: [phone]

## 2022-07-15 ENCOUNTER — Other Ambulatory Visit: Payer: Commercial Managed Care - HMO

## 2022-07-15 DIAGNOSIS — K529 Noninfective gastroenteritis and colitis, unspecified: Secondary | ICD-10-CM

## 2022-07-16 ENCOUNTER — Telehealth: Payer: Self-pay | Admitting: Dietician

## 2022-07-16 NOTE — Telephone Encounter (Signed)
Spoke to [employer] rep, [employer]. He is familiar with Ms Callins and will reach out to her. I faxed her demo sheet and chart notes along with 2 a1cs to  [employer] rep at  [phone]. Rep will send [place] an order to be signed.

## 2022-07-16 NOTE — Telephone Encounter (Signed)
I am happy to assist. Thank you for letting me know it has to go through [employer].

## 2022-07-19 ENCOUNTER — Other Ambulatory Visit (HOSPITAL_COMMUNITY): Payer: Self-pay | Admitting: Internal Medicine

## 2022-07-19 LAB — GI PROFILE, STOOL, PCR

## 2022-07-22 ENCOUNTER — Encounter: Payer: Commercial Managed Care - HMO | Admitting: *Deleted

## 2022-07-23 ENCOUNTER — Telehealth: Payer: Self-pay

## 2022-07-23 NOTE — Telephone Encounter (Signed)
Decision:Approved Lesli Albee (Key: [id number]) PA Case ID #: [id number] Rx #: [id number] Need Help? Call us at [phone] Outcome Approved today [id number];GLOVFI:[id number];Review Type:Prior Auth;Coverage Start Date:07/23/2022;Coverage End Date:07/23/2023; Authorization Expiration Date: 07/22/2023 Drug NovoLOG 100UNIT/ML solution ePA cloud Pension scheme manager [place] Form [id number] NCPDP) Original Claim Info 75 MD CALL HELP DESK  Pharmacy is aware of approval.

## 2022-07-23 NOTE — Telephone Encounter (Signed)
Prior Authorization for patient Patent examiner) came through on cover my meds was submitted with last office notes awaiting approval or denial

## 2022-07-24 ENCOUNTER — Other Ambulatory Visit: Payer: Self-pay | Admitting: Student

## 2022-07-24 DIAGNOSIS — E1059 Type 1 diabetes mellitus with other circulatory complications: Secondary | ICD-10-CM

## 2022-07-24 MED ORDER — GLUCOSE BLOOD VI STRP: ORAL_STRIP | 11 refills | Status: AC

## 2022-07-24 NOTE — Progress Notes (Signed)
Called and spoke to the patient about the results of her GI panel which showed no tested pathogens detected.  We also discussed that she has not been able to get her insulin pump and has been trying to contact the device distributor without success.  I will send a message to the front desk to see if there is anything that we can do to help her get her insulin pump.  She has an appointment with GI on 07/31/2022 for further evaluation of her chronic diarrhea.

## 2022-07-29 ENCOUNTER — Ambulatory Visit: Payer: Self-pay | Admitting: *Deleted

## 2022-07-29 ENCOUNTER — Encounter: Payer: Self-pay | Admitting: *Deleted

## 2022-07-30 ENCOUNTER — Telehealth: Payer: Self-pay | Admitting: Dietician

## 2022-07-30 NOTE — Patient Outreach (Signed)
  Care Coordination   Initial Visit Note   07/30/2022 Name: Amanda Frederick MRN: [id number] DOB: 12-16-1970  Amanda Frederick is a 52 y.o. year old female who sees Amanda Blamer, DO for primary care. I spoke with  Amanda Frederick by phone today.  What matters to the patients health and wellness today?  Health and mental health wellness.    Goals Addressed             This Visit's Progress    Depression/Adjustment to life changes       Care Coordination Interventions: CSW spoke with pt by phone on 07/29/22. Pt reports she was diagnosed with CHF in July, 2022 and has felt her depression has worsened. Pt denies SI/HI- reports no history of SI or hospitalizations for mental health reasons.  She does share that she tried a RX in her 75's but does not recall what. Pt also shared she has anxiety and sometimes feels like she is having panic attacks-. Discussed relaxation/breathing techniques which pt is familiar with- "I do the 4,3,7' (inhale 4 seconds, hold it for 3 seconds and release for 7 seconds). Encouraged pt to continue with this and to try other techniques discussed as well (imagery, re-directing, etc) Pt indicates she is not on any RX for depression or anxiety- she is open to considering this and agrees to [place] collaborating with PCP to consider options. CSW also encouraged pt to consider counseling/support.  Pt is interested in seeking some type of employment that she can do with her medical conditions- suggested she seek options for job as well as consider volunteering.   Motivational Interviewing employed Depression screen reviewed  PHQ2/ PHQ9 completed Pt reports difficulty with sleep, energy and PO intake Solution-Focused Strategies employed:  Mindfulness or Relaxation training provided Active listening / Reflection utilized  Problem [place] strategies reviewed Provided general psycho-education for mental health needs  Quality of sleep assessed & Sleep Hygiene techniques  promoted  Participation in counseling encouraged- limited options for in-person due to location of her residence but will consider- maybe can do virtual and/or through [facility] office Therapist. Pt has good family support- husband and a daughter plus family who moved in to help- pt has poor vision, cannot drive and relies on family to help her as needed.          SDOH assessments and interventions completed:  Yes       SDOH Interventions Today    Flowsheet Row Most Recent Value  SDOH Interventions   Food Insecurity Interventions Intervention Not Indicated  Housing Interventions Intervention Not Indicated  Transportation Interventions Intervention Not Indicated  Utilities Interventions Intervention Not Indicated  Alcohol Usage Interventions Intervention Not Indicated (Score <7)  Depression Interventions/Treatment  Medication, Counseling  Financial Strain Interventions Intervention Not Indicated  Stress Interventions Provide Counseling        Care Coordination Interventions:  Yes, provided   Follow up plan: Follow up call scheduled for 08/05/22    Encounter Outcome:  Pt. Visit Completed

## 2022-07-30 NOTE — Telephone Encounter (Signed)
-----   Message from Atha Starks sent at 07/26/2022  3:36 PM EST ----- Regarding: RE: Insulin pump help Butch Penny can you help this patient?  Doris ----- Message ----- From: [name], DO Sent: 07/26/2022   2:52 PM EST To: [facility] Front Desk Pool Subject: Insulin pump help                              Hey this patient has had trouble getting in contact with the device people for her Tslim insulin pump. I don't know if that is something we can help with but she might need another avenue to get a pump if her current route is not going to work. If we think we can help her with this can we give her a call? Thank you

## 2022-07-30 NOTE — Telephone Encounter (Signed)
Amanda Frederick says she has spoken to someone about her pump and found out the cost of the new pump would not leave her enough money to buy infusion sets or cartridges for it. She is wondering if she has to upgrade or can she use her old pump until it stops working (it is still working). She is not sure who can help her with this, she gets messages that her old pump out of warranty.  Local [employer] rep is out of the office until tomorrow- she was given his number.  Call to tandem customer support- they say she Can use old pump- it is up to her. She would not be able to do any updates such as Dexcom G7. Has basal IQ, cannot update her software to control IQ. They suggested she call customer support, option 3, talk to them about her situation and ask about patient assistance. Information relayed to Ms Kunz.  She verbalized understanding.

## 2022-07-30 NOTE — Patient Instructions (Signed)
Visit Information  Thank you for taking time to visit with me today. Please don't hesitate to contact me if I can be of assistance to you.   Following are the goals we discussed today:   Goals Addressed             This Visit's Progress    Depression/Adjustment to life changes       Care Coordination Interventions: CSW spoke with pt by phone on 07/29/22. Pt reports she was diagnosed with CHF in July, 2022 and has felt her depression has worsened. Pt denies SI/HI- reports no history of SI or hospitalizations for mental health reasons.  She does share that she tried a RX in her 8's but does not recall what. Pt also shared she has anxiety and sometimes feels like she is having panic attacks-. Discussed relaxation/breathing techniques which pt is familiar with- "I do the 4,3,7' (inhale 4 seconds, hold it for 3 seconds and release for 7 seconds). Encouraged pt to continue with this and to try other techniques discussed as well (imagery, re-directing, etc) Pt indicates she is not on any RX for depression or anxiety- she is open to considering this and agrees to [place] collaborating with PCP to consider options. CSW also encouraged pt to consider counseling/support.  Pt is interested in seeking some type of employment that she can do with her medical conditions- suggested she seek options for job as well as consider volunteering.   Motivational Interviewing employed Depression screen reviewed  PHQ2/ PHQ9 completed Pt reports difficulty with sleep, energy and PO intake Solution-Focused Strategies employed:  Mindfulness or Relaxation training provided Active listening / Reflection utilized  Problem [place] strategies reviewed Provided general psycho-education for mental health needs  Quality of sleep assessed & Sleep Hygiene techniques promoted  Participation in counseling encouraged- limited options for in-person due to location of her residence but will consider- maybe can do virtual  and/or through [facility] office Therapist. Pt has good family support- husband and a daughter plus family who moved in to help- pt has poor vision, cannot drive and relies on family to help her as needed.          Our next appointment is by telephone on 08/05/22 at 1pm  Please call the care guide team at [phone] if you need to cancel or reschedule your appointment.   If you are experiencing a Mental Health or [place] or need someone to talk to, please call the Suicide and Crisis Lifeline: 988 call the [place] National Suicide Prevention Lifeline: 901-455-9452 or TTY: 905-517-9096 TTY 2185526554) to talk to a trained counselor call 911   Patient verbalizes understanding of instructions and care plan provided today and agrees to view in [place]. Active MyChart status and patient understanding of how to access instructions and care plan via MyChart confirmed with patient.     Telephone follow up appointment with care management team member scheduled for:08/05/22  Eduard Clos MSW, LCSW Licensed Clinical Social Worker    [phone]

## 2022-07-31 ENCOUNTER — Other Ambulatory Visit (INDEPENDENT_AMBULATORY_CARE_PROVIDER_SITE_OTHER): Payer: Commercial Managed Care - HMO

## 2022-07-31 ENCOUNTER — Ambulatory Visit: Payer: Commercial Managed Care - HMO | Admitting: Gastroenterology

## 2022-07-31 ENCOUNTER — Encounter: Payer: Self-pay | Admitting: Gastroenterology

## 2022-07-31 VITALS — BP 120/74 | HR 81 | Ht 66.0 in | Wt 167.0 lb

## 2022-07-31 DIAGNOSIS — R197 Diarrhea, unspecified: Secondary | ICD-10-CM

## 2022-07-31 DIAGNOSIS — R1084 Generalized abdominal pain: Secondary | ICD-10-CM | POA: Diagnosis not present

## 2022-07-31 DIAGNOSIS — I2510 Atherosclerotic heart disease of native coronary artery without angina pectoris: Secondary | ICD-10-CM | POA: Diagnosis not present

## 2022-07-31 LAB — SEDIMENTATION RATE: Sed Rate: 17 mm/hr (ref 0–30)

## 2022-07-31 LAB — HIGH SENSITIVITY CRP: CRP, High Sensitivity: 1.62 mg/L (ref 0.000–5.000)

## 2022-07-31 LAB — VITAMIN B12: Vitamin B-12: 366 pg/mL (ref 211–911)

## 2022-07-31 LAB — FOLATE: Folate: 9.3 ng/mL (ref >5.9)

## 2022-07-31 MED ORDER — DICYCLOMINE HCL 20 MG PO TABS: 20.0000 mg | ORAL_TABLET | Freq: Four times a day (QID) | ORAL | 3 refills | Status: DC

## 2022-07-31 NOTE — Progress Notes (Signed)
HPI : Amanda Frederick is a very pleasant 52 year old female with a history of Type I DM, CAD s/p stent with ischemic cardiomyopathy, depression and anxiety who is referred to [place] by Dr. Nelda Bucks for further evaluation and management of chronic diarrhea.  She states that she had problems with occasional diarrhea which would last a few days or so, but she developed abrupt onset diarrhea in May of this year. She describes the diarrhea as 'explosive' and associated with significant abdominal pain.  She reports having >10 Bms/day for weeks.  She reports her baseline bowel habits consist of 3-4 poorly formed stools per day without abdominal pain.  She denies seeing any blood in the stool, but she does report seeing whitish floating particles in her stool.  She has been having nocturnal stools some nights.   She reports decreased appetite and a 45 lb weight loss since May.  Since her diarrhea started, it has decreased in severity, but her pain has continued to be significant.  She describes her pain as sharp, crampy and dull.  Her pain is sometimes associated with urge to defecate, but not always.   She still sees whitish floating particles in her stools.  She doesn't think that fatty meals cause worse diarrhea than other foods.  She does think red meat makes her pain worse, but not her diarrhea. She has persistent nausea, but no vomiting.  However, she says that if she eats eggs, she will have immediate projectile vomiting, within a few bites.  She never had any problems with eggs prior to the past few months.  She denies having vomiting if she eats foods that may eggs as an ingredient (ie baked goods).    She was prescribed loperamide at a recent visit.  She was instructed to take it with meals and after loose stools, and took 6 tabs on the first day.  She developed significant abdominal pain and distention and was unable to pass any stool.  She took three or four loperamide the next day and continued to  have worsened pain and distention.  She has not tried taking any more loperamide since then.  She reports having an EGD/Colonoscopy in [place] by Dr. Odie Sera maybe 10-15 years ago to evaluate bloating and abdominal pain.  She says that she did have polyps removed.    She reports problems with general anesthesia in the past ('I died three times during surgery').  This occurred during her tubal ligation in 1995, hysterectomy in 2008 and a sinus surgery sometime between the two.  She had a recent eye surgery with propofol which was uneventful.    She has a history of significant CAD diagnosed with July 2022 with 90% Lcx stenosis treated with DES.  TTE at that time showed EF 25-30%.   cMRI in Feb 2023 showed an LVEF of 43% and myocardial infarction-pattern scarring consistent with substantial LAD territory MI. Repeat cath on Sep 20, 2021 showed progression of LAD disease with diffuse diabetic CAD in mid/distal LAD, but no target for intervention.  EF 35-45%.  Her diabetes has been poorly controlled recently due to PCP turnover and dysfunction of her insulin pump.  She has had blood sugars in the 400-600 range in the past few months.  She re-established care with a new PCP and is working on improving her diabetic control.   Past Medical History:  Diagnosis Date   AC (acromioclavicular) joint bone spurs    Anemia    Anxiety  Arthritis    Bronchitis    Bulging lumbar disc    Bursitis    Complication of anesthesia    "has died 3 separate times under anesthesia"   Depression    Diabetes ([place])    Environmental allergies    Fibromyalgia    High blood pressure    High cholesterol    History of COVID-19 01/2020   was hospitalized   History of endometriosis    History of uterine fibroid    Migraines    Neuropathy    eyes , hands and feet    Pinched nerve    Pneumonia    Seizure ([place])    after eye test only   Spondylosis    Tachycardia    after COVID   Tendonitis    Vision  abnormalities      Past Surgical History:  Procedure Laterality Date   CORONARY STENT INTERVENTION N/A 02/19/2021   Procedure: CORONARY STENT INTERVENTION;  Surgeon: Jettie Booze, MD;  Location: [facility];  Service: Cardiovascular;  Laterality: N/A;   NASAL SINUS SURGERY     ORIF ANKLE FRACTURE Left 01/04/2021   Procedure: OPEN REDUCTION INTERNAL FIXATION (ORIF) LEFT BIMALLEOLAR ANKLE FRACTURE;  Surgeon: Mcarthur Rossetti, MD;  Location: [facility];  Service: Orthopedics;  Laterality: Left;   RIGHT/LEFT HEART CATH AND CORONARY ANGIOGRAPHY N/A 02/19/2021   Procedure: RIGHT/LEFT HEART CATH AND CORONARY ANGIOGRAPHY;  Surgeon: Jettie Booze, MD;  Location: [place] [facility];  Service: Cardiovascular;  Laterality: N/A;   RIGHT/LEFT HEART CATH AND CORONARY ANGIOGRAPHY N/A 09/20/2021   Procedure: RIGHT/LEFT HEART CATH AND CORONARY ANGIOGRAPHY;  Surgeon: Jolaine Artist, MD;  Location: [facility];  Service: Cardiovascular;  Laterality: N/A;   TUBAL LIGATION     VAGINAL HYSTERECTOMY     Family History  Problem Relation Age of Onset   Anemia Mother    High Cholesterol Mother    Diabetes Mother    Arthritis Mother    Diabetes type I Maternal Grandmother    Social History   Tobacco Use   Smoking status: Never   Smokeless tobacco: Never  Substance Use Topics   Alcohol use: Not Currently    Comment: special occassions   Drug use: No   Current Outpatient Medications  Medication Sig Dispense Refill   aspirin 81 MG chewable tablet Chew by mouth daily.     atorvastatin (LIPITOR) 80 MG tablet Take 1 tablet (80 mg total) by mouth daily. 90 tablet 3   cholecalciferol (VITAMIN D) 1000 UNITS tablet Take 1,000 Units by mouth daily.     clopidogrel (PLAVIX) 75 MG tablet TAKE 1 TABLET BY MOUTH EVERY DAY 90 tablet 3   Continuous Blood Gluc Receiver ([place]) DEVI 1 each by Does not apply route as needed. 1 each 0   Continuous Blood Gluc Sensor (DEXCOM G7  SENSOR) MISC 3 each by Does not apply route as directed. 3 each 11   furosemide (LASIX) 40 MG tablet Take 1 tablet (40 mg total) by mouth daily as needed. 30 tablet 6   glucose blood (BAYER CONTOUR NEXT TEST) test strip Test 8-10 times per day 8-10 lancets/day 300 each 11   insulin aspart (NOVOLOG) 100 UNIT/ML injection take up to 150 units per day via the pump as instructed 10 mL 11   Insulin Infusion Pump (T:SLIM X2 CONTROL-IQ PUMP) DEVI 1 each by Does not apply route as needed. 1 each 0   Insulin Infusion Pump  Supplies (PARADIGM QUICK-SET 32" 6MM) MISC 1 Device by Does not apply route continuous. Vovolog (Patient not taking: Reported on 02/11/2022)     Insulin Infusion Pump Supplies (T:SLIM X2 3ML CARTRIDGE) MISC 1 each by Does not apply route as needed. 10 each 11   Insulin Infusion Pump Supplies (T:SLIM X2/CONTROL-IQ/ACC/INSTR) MISC 1 each by Does not apply route as needed. 1 each 0   isosorbide mononitrate (IMDUR) 30 MG 24 hr tablet Take 0.5 tablets (15 mg total) by mouth daily. 15 tablet 7   loperamide (IMODIUM) 2 MG capsule Take 1 capsule (2 mg total) by mouth as needed for diarrhea or loose stools. Take 2 mg after every loose bowel movement with a maximum of 16 mg a day (maximum of 8 pills a day) 90 capsule 2   Magnesium 250 MG TABS Take 250 mg by mouth daily.     metoprolol succinate (TOPROL-XL) 50 MG 24 hr tablet TAKE 1 TABLET BY MOUTH TWICE A DAY 180 tablet 1   Multiple Vitamin (MULTIVITAMIN) capsule Take 1 capsule by mouth daily.     potassium chloride SA (KLOR-CON M) 20 MEQ tablet Take 1 tablet (20 mEq total) by mouth daily. 90 tablet 3   sacubitril-valsartan (ENTRESTO) 49-51 MG Take 1 tablet by mouth 2 (two) times daily. 60 tablet 6   spironolactone (ALDACTONE) 25 MG tablet TAKE 1/2 TABLET BY MOUTH EVERY DAY 45 tablet 3   No current facility-administered medications for this visit.   Facility-Administered Medications Ordered in Other Visits  Medication Dose Route Frequency Provider  Last Rate Last Admin   sodium chloride flush (NS) 0.9 % injection 3 mL  3 mL Intravenous Q12H Bensimhon, Shaune Pascal, MD       Allergies  Allergen Reactions   Fentanyl Nausea And Vomiting   Other Hives    Antibiotic specific one unsure but has tolerated antibiotic recently. Reaction was about 20 years ago.     Review of Systems: All systems reviewed and negative except where noted in HPI.    No results found.  Physical Exam: BP 120/74   Pulse 81   Ht 5\' 6"  (1.676 m)   Wt 167 lb (75.8 kg)   SpO2 99%   BMI 26.95 kg/m  Constitutional: Pleasant,well-developed, Caucasian female in no acute distress.  Accompanied by husband HEENT: Normocephalic and atraumatic. Conjunctivae are normal. No scleral icterus. Neck supple.  Cardiovascular: Normal rate, regular rhythm.  Pulmonary/chest: Effort normal and breath sounds normal. No wheezing, rales or rhonchi. Abdominal: Soft, nondistended, diffuse mild multifocal tenderness to palpation. No rigidity or guarding.  Bowel sounds active throughout. There are no masses palpable. No hepatomegaly. Extremities: no edema Neurological: Alert and oriented to person place and time. Skin: Skin is warm and dry. No rashes noted. Psychiatric: Normal mood and affect. Behavior is normal.  CBC    Component Value Date/Time   WBC 8.5 11/05/2021 1446   RBC 5.14 (H) 11/05/2021 1446   HGB 14.3 11/05/2021 1446   HGB 15.0 09/13/2021 1409   HCT 42.9 11/05/2021 1446   HCT 45.2 09/13/2021 1409   PLT 266 11/05/2021 1446   PLT 271 09/13/2021 1409   MCV 83.5 11/05/2021 1446   MCV 85 09/13/2021 1409   MCH 27.8 11/05/2021 1446   MCHC 33.3 11/05/2021 1446   RDW 13.1 11/05/2021 1446   RDW 12.5 09/13/2021 1409   LYMPHSABS 1.2 02/15/2021 0409   MONOABS 0.7 02/15/2021 0409   EOSABS 0.0 02/15/2021 0409   BASOSABS 0.0 02/15/2021 0409  CMP     Component Value Date/Time   NA 138 06/16/2022 0954   NA 141 09/13/2021 1409   K 5.1 06/16/2022 0954   CL 100  06/16/2022 0954   CO2 22 06/16/2022 0954   GLUCOSE 509 (HH) 06/16/2022 0954   BUN 13 06/16/2022 0954   BUN 28 (H) 09/13/2021 1409   CREATININE 0.85 06/16/2022 0954   CALCIUM 10.0 06/16/2022 0954   PROT 6.9 06/16/2022 0954   ALBUMIN 4.2 06/16/2022 0954   AST 24 06/16/2022 0954   ALT 26 06/16/2022 0954   ALKPHOS 111 06/16/2022 0954   BILITOT 1.5 (H) 06/16/2022 0954   GFRNONAA >60 06/16/2022 0954   GFRAA >60 03/07/2020 0130   Component Ref Range & Units 2 wk ago  Campylobacter Not Detected Not Detected  C difficile toxin A/B Not Detected Not Detected  Plesiomonas shigelloides Not Detected Not Detected  Salmonella Not Detected Not Detected  Vibrio Not Detected Not Detected  Vibrio cholerae Not Detected Not Detected  Yersinia enterocolitica Not Detected Not Detected  Enteroaggregative E coli Not Detected Not Detected  Enteropathogenic E coli Not Detected Not Detected  Enterotoxigenic E coli Not Detected Not Detected  Shiga-toxin-producing E coli Not Detected Not Detected  E coli O157 Not Detected Not applicable  Shigella/Enteroinvasive E coli Not Detected Not Detected  Cryptosporidium Not Detected Not Detected  Cyclospora cayetanensis Not Detected Not Detected  Entamoeba histolytica Not Detected Not Detected  Giardia lamblia Not Detected Not Detected  Adenovirus F 40/41 Not Detected Not Detected  Astrovirus Not Detected Not Detected  Norovirus GI/GII Not Detected Not Detected  Rotavirus A Not Detected Not Detected  Sapovirus Not Detected Not Detected    ASSESSMENT AND PLAN: 52 year old female with Type I DM, currently poorly controlled, CAD with hx DES and diffuse CAD not amenable to intervention with depressed EF, with persistent diarrhea, abdominal pain and unintentional weight loss.  GI pathogen panel negative for infectious etiologies, including C Diff.  Etiology unclear, but unintentional weight loss certainly concerning.  Absence of blood in stools makes an inflammatory  diarrhea less likely, but Crohn's not excluded.  EPI very possible given her long standing Type I DM.  Unclear if the patient is describing mucus in her stool, or steatorrhea. Will check fecal lactoferrin, ESR/CRP to assess for inflammatory diarrhea, fecal elastase to evaluate for EPI, TTG/IgA  to exclude celiac disease. Microscopic colitis also very possible, and patient needs a colonoscopy for colon cancer screening as well, but currently patient is fairly high risk from an anesthesia standpoint due to her diffuse CAD, cardiomyopathy and poorly controlled DM.  She has a follow up appointment with the heart failure clinic tomorrow. I would prefer to defer a colonoscopy for now given her perioperative risk. Will perform non-invasive work up for now, and have her follow up with cardiology and continue to improve her hyperglycemia. In the meantime, she would likely benefit from loperamide, but will titrate up dose as needed to decrease stool frequency without causing the abdominal distention/pain she had with higher doses.  I recommended she start by taking one tablet in the morning for 2-3 days, then add a second dose in the afternoon.  She can increase as needed from there, max dose 8/day.  Try Bentyl PRN for abdominal pain Will request previous endoscopy records from Dr. Marcelina Morel office. Follow up in 8 weeks and reassess risks/benefits of colonoscopy    Diarrhea - fecal lactoferrin, CRP, ESR - fecal elastase - TTG/IgA - Restart Loperamide  once daily, titrate up dose  - Bentyl 20 mg PO q6 hrs PRN abdominal pain - Previous colonoscopy records - f/u 8 weeks, reconsider colonoscopy at that time (improved DM and complete cardiology f/u)  Ithzel Fedorchak E. Candis Schatz, MD [facility]  CC:  Nicoletta Dress, MD

## 2022-07-31 NOTE — Patient Instructions (Signed)
_______________________________________________________  If you are age 53 or older, your body mass index should be between 23-30. Your Body mass index is 26.95 kg/m. If this is out of the aforementioned range listed, please consider follow up with your Primary Care Provider.  If you are age 73 or younger, your body mass index should be between 19-25. Your Body mass index is 26.95 kg/m. If this is out of the aformentioned range listed, please consider follow up with your Primary Care Provider.   ________________________________________________________  The  GI providers would like to encourage you to use [facility] to communicate with providers for non-urgent requests or questions.  Due to long hold times on the telephone, sending your provider a message by [facility] may be a faster and more efficient way to get a response.  Please allow 48 business hours for a response.  Please remember that this is for non-urgent requests.  _______________________________________________________  Your provider has requested that you go to the basement level for lab work before leaving today. Press "B" on the elevator. The lab is located at the first door on the left as you exit the elevator.  We have sent the following medications to your pharmacy for you to pick up at your convenience:  Bentyl  Take your Loperamide one tablet in the morning for 2-3 days and then increase as needed.

## 2022-08-01 ENCOUNTER — Ambulatory Visit (HOSPITAL_COMMUNITY)
Admission: RE | Admit: 2022-08-01 | Discharge: 2022-08-01 | Disposition: A | Discharge status: Home / Self Care | Payer: Commercial Managed Care - HMO | Source: Ambulatory Visit | Attending: Family Medicine | Admitting: Family Medicine

## 2022-08-01 ENCOUNTER — Encounter (HOSPITAL_COMMUNITY): Payer: Self-pay

## 2022-08-01 ENCOUNTER — Other Ambulatory Visit (HOSPITAL_COMMUNITY): Payer: Self-pay | Admitting: Internal Medicine

## 2022-08-01 ENCOUNTER — Telehealth: Payer: Self-pay | Admitting: Student

## 2022-08-01 ENCOUNTER — Other Ambulatory Visit: Payer: Commercial Managed Care - HMO

## 2022-08-01 ENCOUNTER — Inpatient Hospital Stay (HOSPITAL_COMMUNITY)
Admission: RE | Admit: 2022-08-01 | Discharge: 2022-08-01 | Disposition: A | Discharge status: Home / Self Care | Payer: Commercial Managed Care - HMO | Source: Ambulatory Visit | Attending: Internal Medicine | Admitting: Internal Medicine

## 2022-08-01 VITALS — BP 140/84 | HR 99 | Wt 167.0 lb

## 2022-08-01 DIAGNOSIS — Z794 Long term (current) use of insulin: Secondary | ICD-10-CM | POA: Diagnosis not present

## 2022-08-01 DIAGNOSIS — I471 Supraventricular tachycardia, unspecified: Secondary | ICD-10-CM

## 2022-08-01 DIAGNOSIS — E7800 Pure hypercholesterolemia, unspecified: Secondary | ICD-10-CM

## 2022-08-01 DIAGNOSIS — Z79899 Other long term (current) drug therapy: Secondary | ICD-10-CM | POA: Insufficient documentation

## 2022-08-01 DIAGNOSIS — R0989 Other specified symptoms and signs involving the circulatory and respiratory systems: Secondary | ICD-10-CM | POA: Insufficient documentation

## 2022-08-01 DIAGNOSIS — Z7902 Long term (current) use of antithrombotics/antiplatelets: Secondary | ICD-10-CM | POA: Insufficient documentation

## 2022-08-01 DIAGNOSIS — R0602 Shortness of breath: Secondary | ICD-10-CM | POA: Insufficient documentation

## 2022-08-01 DIAGNOSIS — E785 Hyperlipidemia, unspecified: Secondary | ICD-10-CM | POA: Diagnosis not present

## 2022-08-01 DIAGNOSIS — I2510 Atherosclerotic heart disease of native coronary artery without angina pectoris: Secondary | ICD-10-CM | POA: Diagnosis not present

## 2022-08-01 DIAGNOSIS — R197 Diarrhea, unspecified: Secondary | ICD-10-CM

## 2022-08-01 DIAGNOSIS — I252 Old myocardial infarction: Secondary | ICD-10-CM | POA: Insufficient documentation

## 2022-08-01 DIAGNOSIS — I110 Hypertensive heart disease with heart failure: Secondary | ICD-10-CM | POA: Diagnosis not present

## 2022-08-01 DIAGNOSIS — I959 Hypotension, unspecified: Secondary | ICD-10-CM

## 2022-08-01 DIAGNOSIS — Z9641 Presence of insulin pump (external) (internal): Secondary | ICD-10-CM | POA: Diagnosis not present

## 2022-08-01 DIAGNOSIS — R0683 Snoring: Secondary | ICD-10-CM

## 2022-08-01 DIAGNOSIS — I5022 Chronic systolic (congestive) heart failure: Secondary | ICD-10-CM | POA: Diagnosis not present

## 2022-08-01 DIAGNOSIS — E109 Type 1 diabetes mellitus without complications: Secondary | ICD-10-CM

## 2022-08-01 DIAGNOSIS — Z955 Presence of coronary angioplasty implant and graft: Secondary | ICD-10-CM | POA: Insufficient documentation

## 2022-08-01 DIAGNOSIS — Z7982 Long term (current) use of aspirin: Secondary | ICD-10-CM | POA: Insufficient documentation

## 2022-08-01 DIAGNOSIS — I25118 Atherosclerotic heart disease of native coronary artery with other forms of angina pectoris: Secondary | ICD-10-CM | POA: Insufficient documentation

## 2022-08-01 DIAGNOSIS — Z8616 Personal history of COVID-19: Secondary | ICD-10-CM | POA: Diagnosis not present

## 2022-08-01 DIAGNOSIS — R002 Palpitations: Secondary | ICD-10-CM

## 2022-08-01 DIAGNOSIS — M797 Fibromyalgia: Secondary | ICD-10-CM | POA: Insufficient documentation

## 2022-08-01 DIAGNOSIS — E1040 Type 1 diabetes mellitus with diabetic neuropathy, unspecified: Secondary | ICD-10-CM | POA: Diagnosis not present

## 2022-08-01 LAB — BRAIN NATRIURETIC PEPTIDE: B Natriuretic Peptide: 22.1 pg/mL (ref 0.0–100.0)

## 2022-08-01 LAB — BASIC METABOLIC PANEL
Anion gap: 13 (ref 5–15)
BUN: 16 mg/dL (ref 6–20)
CO2: 20 mmol/L — ABNORMAL LOW (ref 22–32)
Calcium: 8.9 mg/dL (ref 8.9–10.3)
Chloride: 95 mmol/L — ABNORMAL LOW (ref 98–111)
Creatinine, Ser: 0.8 mg/dL (ref 0.44–1.00)
GFR, Estimated: >60 mL/min (ref >60)
Glucose, Bld: 769 mg/dL (ref 70–99)
Potassium: 4.7 mmol/L (ref 3.5–5.1)
Sodium: 128 mmol/L — ABNORMAL LOW (ref 135–145)

## 2022-08-01 LAB — IGA: Immunoglobulin A: 173 mg/dL (ref 47–310)

## 2022-08-01 LAB — TISSUE TRANSGLUTAMINASE, IGA: (tTG) Ab, IgA: <1 U/mL

## 2022-08-01 MED ORDER — ENTRESTO 24-26 MG PO TABS: 1.0000 | ORAL_TABLET | Freq: Two times a day (BID) | ORAL | 8 refills | Status: DC

## 2022-08-01 NOTE — Progress Notes (Signed)
Advanced Heart Failure Clinic Note    PCP: Rocky Morel, DO Primary Cardiologist: Parke Poisson, MD  HF Cardiologist: Dr. Gala Romney  HPI: Amanda Frederick is a 52 y.o. female with history of DM1, HTN, HLD, hx fibromyalgia, neuropathy, hx COVID-19 infection 02/2020. No cardiac history until recently.   Reports gradually progressing dyspnea with exertion for a couple of years. Felt horrible after COVID and dyspnea never really improved. Underwent ORIF for ankle fracture in June 2022. Initially did okay post-op. Subsequently developed LE edema, dyspnea and orthopnea. She was later admitted July 2022 with acute systolic CHF.   Echo 7/22: EF 25-30%, RV ok, large left pleural effusion, mild MR. Diuresed with IV lasix. Underwent thoracentesis for L pleural effusion.  R/LHC 7/22:  50% OM1, 50% OM2, 50% D2, 50% mid to distal LAD, 90% mid Lcx treated with PCI/DES RA 6 PA 34/15 (23) PCWP 9 mmHg Fick CO 5.02 Fick CI 2.54 PA sat 75%  Echo 11/22: EF 30-35%, septal and peri-apical severe hypokinesis, RV okay, mild MR  Seen for initial HF clinic visit 08/07/21. Noted ongoing dyspnea with exertion. Volume looked okay. ReDS 28%. Started on spiro.   Echo 09/16/21: EF 35-40%, RV okay, mild MR  cMRI 09/16/21: LVEF 43%, RVEF 49%, delayed enhancement showing myocardial infarction-pattern scarring consistent with substantial LAD territory MI.  Repeat cath 09/20/21 showed progression of LAD disease. Now with diffuse diabetic CAD in mid to distal LAD corresponding to infarct seen on cMRI. Reviewed with IC team. No target for intervention particularly given lack of viability on MRI. EF 35-45%   Ao = 133/79 (104) LV = 124/15 RA =  1 RV = 30/6 PA = 29/8 (17) PCW = 10 Fick cardiac output/index = 6.1/3.1 PVR = 0.9 WU Ao sat = 99% PA sat = 81%, 82% High SVC = 79%   Follow up 7/23, stable NYHA III and volume ok. Entresto increased to 49/51. Discussed enrolling in virtual CR.  Follow up 11/23, stable  NYHA III and volume ok. CPX arranged as symptoms out of proportion to exam findings.   Today she returns for an acute visit with complaints of low BP at home.She checks her BP multiple times a day, has some lows in 50-70's systolic. Other times,120/70s. No swelling but bloated. Feels palpitations, no syncope but lightheadedness. Of note, blood sugars have been 300- greater than 600. She is short of breath walking on flat ground. Denies CP, abnormal bleeding or PND/Orthopnea. Chronically sleeps reclined. Appetite ok. No fever or chills. Weight at home 160 pounds. Taking all medications. Following with GI for chronic diarrhea.   Review of Systems:  Cardiac and Respiratory - Negative except as mentioned in HPI.  Past Medical History:  Diagnosis Date   AC (acromioclavicular) joint bone spurs    Anemia    Anxiety    Arthritis    Bronchitis    Bulging lumbar disc    Bursitis    CHF (congestive heart failure) (HCC)    Complication of anesthesia    "has died 3 separate times under anesthesia"   Depression    Diabetes (HCC)    Environmental allergies    Fibromyalgia    High blood pressure    High cholesterol    History of COVID-19 01/2020   was hospitalized   History of endometriosis    History of uterine fibroid    Migraines    Neuropathy    eyes , hands and feet    Pinched nerve    Pneumonia  Seizure (HCC)    after eye test only   Spondylosis    Tachycardia    after COVID   Tendonitis    Vision abnormalities     Current Outpatient Medications  Medication Sig Dispense Refill   aspirin 81 MG chewable tablet Chew by mouth daily.     cholecalciferol (VITAMIN D) 1000 UNITS tablet Take 1,000 Units by mouth daily.     clopidogrel (PLAVIX) 75 MG tablet TAKE 1 TABLET BY MOUTH EVERY DAY 90 tablet 3   Continuous Blood Gluc Receiver (DEXCOM G7 RECEIVER) DEVI 1 each by Does not apply route as needed. 1 each 0   Continuous Blood Gluc Sensor (DEXCOM G7 SENSOR) MISC 3 each by Does not  apply route as directed. 3 each 11   dicyclomine (BENTYL) 20 MG tablet Take 1 tablet (20 mg total) by mouth every 6 (six) hours. 60 tablet 3   furosemide (LASIX) 40 MG tablet Take 1 tablet (40 mg total) by mouth daily as needed. 30 tablet 6   glucose blood (BAYER CONTOUR NEXT TEST) test strip Test 8-10 times per day 8-10 lancets/day 300 each 11   insulin aspart (NOVOLOG) 100 UNIT/ML injection take up to 150 units per day via the pump as instructed 10 mL 11   loperamide (IMODIUM) 2 MG capsule Take 1 capsule (2 mg total) by mouth as needed for diarrhea or loose stools. Take 2 mg after every loose bowel movement with a maximum of 16 mg a day (maximum of 8 pills a day) 90 capsule 2   Magnesium 250 MG TABS Take 250 mg by mouth daily.     metoprolol succinate (TOPROL-XL) 50 MG 24 hr tablet TAKE 1 TABLET BY MOUTH TWICE A DAY 180 tablet 1   Multiple Vitamin (MULTIVITAMIN) capsule Take 1 capsule by mouth daily.     potassium chloride SA (KLOR-CON M) 20 MEQ tablet Take 1 tablet (20 mEq total) by mouth daily. 90 tablet 3   sacubitril-valsartan (ENTRESTO) 49-51 MG Take 1 tablet by mouth 2 (two) times daily. 60 tablet 6   spironolactone (ALDACTONE) 25 MG tablet TAKE 1/2 TABLET BY MOUTH EVERY DAY 45 tablet 3   atorvastatin (LIPITOR) 80 MG tablet Take 1 tablet (80 mg total) by mouth daily. (Patient not taking: Reported on 07/31/2022) 90 tablet 3   Insulin Infusion Pump (T:SLIM X2 CONTROL-IQ PUMP) DEVI 1 each by Does not apply route as needed. (Patient not taking: Reported on 07/31/2022) 1 each 0   Insulin Infusion Pump Supplies (PARADIGM QUICK-SET 32" ) MISC 1 Device by Does not apply route continuous. Vovolog (Patient not taking: Reported on 02/11/2022)     Insulin Infusion Pump Supplies (T:SLIM X2 CARTRIDGE) MISC 1 each by Does not apply route as needed. (Patient not taking: Reported on 07/31/2022) 10 each 11   Insulin Infusion Pump Supplies (T:SLIM X2/CONTROL-IQ/ACC/INSTR) MISC 1 each by Does not apply route as  needed. (Patient not taking: Reported on 07/31/2022) 1 each 0   isosorbide mononitrate (IMDUR) 30 MG 24 hr tablet Take 0.5 tablets (15 mg total) by mouth daily. (Patient not taking: Reported on 07/31/2022) 15 tablet 7   No current facility-administered medications for this encounter.   Facility-Administered Medications Ordered in Other Encounters  Medication Dose Route Frequency Provider Last Rate Last Admin   sodium chloride flush (NS) 0.9 % injection 3 mL  3 mL Intravenous Q12H Bensimhon, Bevelyn Buckles, MD       Allergies  Allergen Reactions   Fentanyl Nausea And Vomiting  Other Hives    Antibiotic specific one unsure but has tolerated antibiotic recently. Reaction was about 20 years ago.   Social History   Socioeconomic History   Marital status: Married    Spouse name: [name]   Number of children: 3   Years of education: college   Highest education level: Not on file  Occupational History    Comment: Home maker  Tobacco Use   Smoking status: Never   Smokeless tobacco: Never  Vaping Use   Vaping Use: Not on file  Substance and Sexual Activity   Alcohol use: Not Currently    Comment: special occassions   Drug use: No   Sexual activity: Not on file  Other Topics Concern   Not on file  Social History Narrative   Patient lives at home with her husband [name]. Worked previously in optometrist office, left work due to personnel issues. Patient has college education. Patient drinks 4-6 caffeine drinks daily.Right handed.   Social Determinants of Health   Financial Resource Strain: Low Risk  (07/29/2022)   Overall Financial Resource Strain (CARDIA)    Difficulty of Paying Living Expenses: Not hard at all  Food Insecurity: No Food Insecurity (07/29/2022)   Hunger Vital Sign    Worried About Running Out of Food in the Last Year: Never true    Ran Out of Food in the Last Year: Never true  Transportation Needs: No Transportation Needs (07/29/2022)   PRAPARE - Radiographer, therapeutic (Medical): No    Lack of Transportation (Non-Medical): No  Physical Activity: Not on file  Stress: Stress Concern Present (07/29/2022)   [place]    Feeling of Stress : Very much  Social Connections: Not on file  Intimate Partner Violence: Not At Risk (07/29/2022)   Humiliation, Afraid, Rape, and Kick questionnaire    Fear of Current or Ex-Partner: No    Emotionally Abused: No    Physically Abused: No    Sexually Abused: No   Family History  Problem Relation Age of Onset   Anemia Mother    High Cholesterol Mother    Diabetes Mother    Arthritis Mother    Diabetes type I Maternal Grandmother    Colon cancer Neg Hx    Esophageal cancer Neg Hx    Stomach cancer Neg Hx    Colon polyps Neg Hx    BP (!) 140/84   Pulse 99   Wt 75.8 kg (167 lb)   SpO2 99%   BMI 26.95 kg/m   Wt Readings from Last 3 Encounters:  08/01/22 75.8 kg (167 lb)  07/31/22 75.8 kg (167 lb)  07/09/22 78.2 kg (172 lb 6.4 oz)   PHYSICAL EXAM: General:  NAD. No resp difficulty HEENT: Normal Neck: Supple. No JVD. Carotids 2+ bilat; no bruits. No lymphadenopathy or thryomegaly appreciated. Cor: PMI nondisplaced. Regular rate & rhythm. No rubs, gallops or murmurs. Lungs: Clear Abdomen: Soft, nontender, nondistended. No hepatosplenomegaly. No bruits or masses. Good bowel sounds. Extremities: No cyanosis, clubbing, rash, edema Neuro: Alert & oriented x 3, cranial nerves grossly intact. Moves all 4 extremities w/o difficulty. Affect pleasant.  ASSESSMENT & PLAN: Chronic systolic CHF/ICM: - Echo (7/22): EF 25-30%, RV okay - R/LHC (7/22): RA 6, PA mean 23, PCWP 9, Fick CI 2.54. 90% Lcx treated with PCI/DES, diffuse disease marginal branches, LAD and diagonal - Echo (11/22): EF 30-35% RV okay - Echo (2/23): EF 35-40%, RV okay, mild  MR - cMRI (2/23): LVEF 43%, RVEF 49%, delayed enhancement suggestive of substantial LAD territory  MI. - Cath 2/23 EF 35-40% showed progression of LAD disease. Now with diffuse diabetic CAD in mid to distal LAD corresponding to infarct seen on cMRI. Reviewed with IC team. Lcx stent patent. No target for intervention particularly given lack of viability on MRI. RHC ok  - Stable NYHA III. Volume looks OK. - With orthostatic symptoms, decrease Entresto to 24/26 mg bid. I asked her to check BP daily and log. - Continue Lasix 40 mg prn.  - Continue Toprol XL 50 mg bid - Continue spiro 12.5 mg daily. - No SGLT2i with Type I DM - Symptoms seem out of proportion to objective findings. Needs CPX testing to quantify.   - She never heard from CR, will re-refer for virtual CR. - Labs today.  2. CAD: - s/p PCI/DES mid Lcx 07/22.  - cMRI with delayed enhancement suggestive of LAD territory myocardial infarction. Dr. Haroldine Laws reviewed prior cath images from 07/22 personally. There is 70-80% diffuse disease in mid to distal LAD (? Event at some point with acute occlusion and subsequent recannulation) - Cath 2/23 as above. Medical therapy  - No chest pain. - On DAPT with aspirin 81 mg daily + plavix 75 mg daily.  - Continue atorvastatin 80. - Stable angina. No targets for PCI.  - Now off Imdur with labile BP - Encouarged virtual CR (she does not drive).  3. Palpitations - Place Zio AT 14 days. - Labs today. - Sleep study has been ordered.  4. Labile BP - Med changes as above. - Check BP at home and log  5. Hyperlipidemia - On high-intensity statin - Managed by primary cardiology team  6. Type I DM: - Previously had insulin pump - A1c 8.4 (4/23) - Blood sugars uncontrolled recently, does not have pump due to delays with insurance  - ? If uncontrolled BS contributing to clinical picture.  7. Chronic diarrhea - also with weight loss (? Unintentional). - She is being followed by GI  8. Snoring - In-lab sleep study ordered.  Will arrange CPX to quantify HF limitation. Follow up in 4  months with Dr. Haroldine Laws.  [place], FNP 08/01/22

## 2022-08-01 NOTE — Patient Instructions (Addendum)
Thank you for coming in today  Labs were done today, if any labs are abnormal the clinic will call you No news is good news  STAY OFF IMDUR DECREASE Entresto 24/26 1 tablet twice daily   Your provider has recommended that  you wear a Zio Patch for 14 days.  This monitor will record your heart rhythm for our review.  IF you have any symptoms while wearing the monitor please press the button.  If you have any issues with the patch or you notice a red or orange light on it please call the company at [phone].  Once you remove the patch please mail it back to the company as soon as possible so we can get the results.   Check blood pressure and record 1-2 weeks and then send log into the clinic  Your physician recommends that you schedule a follow-up appointment in:  Dr. Haroldine Laws for March 2024 You will receive a reminder letter in the mail a few months in advance. If you don't receive a letter, please call our office to schedule the follow-up appointment.     Do the following things EVERYDAY: Weigh yourself in the morning before breakfast. Write it down and keep it in a log. Take your medicines as prescribed Eat low salt foods--Limit salt (sodium) to 2000 mg per day.  Stay as active as you can everyday Limit all fluids for the day to less than 2 liters  At the [facility], you and your health needs are our priority. As part of our continuing mission to provide you with exceptional heart care, we have created designated Provider Care Teams. These Care Teams include your primary Cardiologist (physician) and Advanced Practice Providers (APPs- Physician Assistants and Nurse Practitioners) who all work together to provide you with the care you need, when you need it.   You may see any of the following providers on your designated Care Team at your next follow up: Dr Glori Bickers Dr Loralie Champagne Dr. Roxana Hires, NP Lyda Jester, [place] [facility] [place], [place] Forestine Na, NP Audry Riles, PharmD   Please be sure to bring in all your medications bottles to every appointment.   If you have any questions or concerns before your next appointment please send [place] a message through [place] or call our office at [phone].    TO LEAVE A MESSAGE FOR THE NURSE SELECT OPTION 2, PLEASE LEAVE A MESSAGE INCLUDING: YOUR NAME DATE OF BIRTH CALL BACK NUMBER REASON FOR CALL**this is important as we prioritize the call backs  YOU WILL RECEIVE A CALL BACK THE SAME DAY AS LONG AS YOU CALL BEFORE 4:00 PM

## 2022-08-01 NOTE — Telephone Encounter (Addendum)
   Received page from Answering Service about a critical lab result. Called and spoke with lab and glucose was 769 on BMET drawn at office visit with Allena Katz, NP, today. Tried to call patient twice and it went straight to voicemail both times. Left a message asking her to call back as soon as possible for critical lab results and recommendations. She will ned to go to the ED for treatment of this. Will try to call again a little later.  Darreld Mclean, PA-C 08/01/2022 6:30 PM  ADDENDUM:  Bonne Dolores calling patient again but it went straight to voicemail. Also attempted to call spouses Johnisha Louks) number. Someone answered and I heard Spanish in the background but no one responded to me. Then tried to call daughter Fayette Pho) at number listed in chart. Left message on that number asking daughter to have patient call our office tonight.   I will notify the APP on-call this weekend of this as well and ask them to follow-up on this if patient does not call back tonight.  Darreld Mclean, PA-C 08/01/2022 7:56 PM

## 2022-08-02 ENCOUNTER — Telehealth: Payer: Self-pay | Admitting: Physician Assistant

## 2022-08-02 NOTE — Telephone Encounter (Signed)
I have attempted the patient's number twice as well as her husband and daughter listed in [place]. I was transferred to VM each time.

## 2022-08-04 ENCOUNTER — Telehealth (HOSPITAL_COMMUNITY): Payer: Self-pay

## 2022-08-04 ENCOUNTER — Telehealth: Payer: Self-pay

## 2022-08-04 DIAGNOSIS — E1059 Type 1 diabetes mellitus with other circulatory complications: Secondary | ICD-10-CM

## 2022-08-04 NOTE — Telephone Encounter (Signed)
Patient called she stated the order needs to go to [facility] Telephone: [phone]

## 2022-08-04 NOTE — Telephone Encounter (Signed)
Spoke to patient. She checked her BS while on the phone with me and it is currently 369. I asked her to follow up with her PCP today and she said she would. She is waiting on an insulin pump and they are trying to regulate her BS she stated. Informed Janett Billow of phone call and she indicated following up with PCP is appropriate.

## 2022-08-04 NOTE — Telephone Encounter (Signed)
Requesting to speak with a nurse about insulin pump. Please call pt back.

## 2022-08-04 NOTE — Telephone Encounter (Signed)
RTC to patient would like to get the refills for the Insulin Pump.  Has been talking to [facility] as she is having problems with her Insurance covering.  Not sure as to whom the prescription orders should be sent to.  Does not think they should go to the Specialty Pharmacy.  Has been working with D. Plyler Diabetes Educator about. Will consult D. Plyler to see where the order should go.

## 2022-08-05 ENCOUNTER — Ambulatory Visit: Payer: Self-pay | Admitting: *Deleted

## 2022-08-05 ENCOUNTER — Telehealth: Payer: Self-pay | Admitting: *Deleted

## 2022-08-05 ENCOUNTER — Other Ambulatory Visit (HOSPITAL_COMMUNITY): Payer: Self-pay

## 2022-08-05 DIAGNOSIS — I5022 Chronic systolic (congestive) heart failure: Secondary | ICD-10-CM

## 2022-08-05 NOTE — Progress Notes (Unsigned)
  Care Coordination Note  08/05/2022 Name: Mauricia Mertens MRN: [id number] DOB: [birth year]  Carolyne Whitsel is a 52 y.o. year old female who is a primary care patient of [name], DO and is actively engaged with the care management team. I reached out to [name] by phone today to assist with scheduling an initial visit with the RN Case Manager  Follow up plan: Unsuccessful telephone outreach attempt made. A HIPAA compliant phone message was left for the patient providing contact information and requesting a return call.   [place]  Direct Dial: [phone]

## 2022-08-05 NOTE — Telephone Encounter (Signed)
I have been communicating with the [facility] rep about Amanda Frederick's insulin pump. The signed order, clinical notes and labs requested have been faxed to him and the number requested by patient today.  Patient notified. She says she goes through a cartridge that contain 300 units Novolog every 1.5 days and on her previous order she would run short of insulin and supplies. Orders pending have been updated to this quantity.

## 2022-08-05 NOTE — Patient Outreach (Signed)
Care Coordination   Follow Up Visit Note   08/05/2022 Name: Amanda Frederick MRN: [id number] DOB: 01-09-1971  Amanda Frederick is a 52 y.o. year old female who sees Amanda Morel, DO for primary care. I spoke with  Amanda Frederick by phone today.  What matters to the patients health and wellness today?  Health and mental health wellness    Goals Addressed             This Visit's Progress    Depression/Adjustment to life changes       Care Coordination Interventions: Pt reports today she continues to find challenges with depression and anxiety when "everyone is here". Having her daughter and her family (3 children) makes her feel "overwhelmed". CSW validated the environment changes at home with the additional family member living there. Pt finds ways to escape and distract herself through headphones, going to her room, etc. She has been listening to some meditations on Youtube that she enjoys.  Pt expressed a feeling of "losing my freedom"- discussed further possible volunteer opportunities- schools, nursing home, etc and feels maybe working with animals might be more comfortable for her- encouraged her to look into this as she wishes. Pt  continues to learn and manage her DM pump- "it's a learning curve". Will ask RNCM to reach out to assess and support as appropriate.  Pt does not have a follow up visit with PCP scheduled- CSW will message PCP to alert as PCP indicated he planned to follow up in a few weeks for follow up and to discuss mental health needs/RX treatment options.  CSW previously spoke with pt by phone on 07/29/22 who reports she was diagnosed with CHF in July, 2022 and has felt her depression has worsened. Pt denies SI/HI- reports no history of SI or hospitalizations for mental health reasons.  She does share that she tried a RX in her 37's but does not recall what. Pt also shared she has anxiety and sometimes feels like she is having panic attacks-. Discussed relaxation/breathing  techniques which pt is familiar with- "I do the 4,3,7' (inhale 4 seconds, hold it for 3 seconds and release for 7 seconds). Encouraged pt to continue with this and to try other techniques discussed as well (imagery, re-directing, etc) Pt indicates she is not on any RX for depression or anxiety- she is open to considering this and agrees to CSW collaborating with PCP to consider options. CSW also encouraged pt to consider counseling/support.  Pt is interested in seeking some type of employment that she can do with her medical conditions- suggested she seek options for job as well as consider volunteering.   Motivational Interviewing employed Depression screen reviewed  PHQ2/ PHQ9 completed Pt reports difficulty with sleep, energy and PO intake Solution-Focused Strategies employed:  Mindfulness or Relaxation training provided Active listening / Reflection utilized  Problem Solving /Task Center strategies reviewed Provided general psycho-education for mental health needs  Quality of sleep assessed & Sleep Hygiene techniques promoted  Participation in counseling encouraged- limited options for in-person due to location of her residence but will consider- maybe can do virtual and/or through [facility] office Therapist. Pt has good family support- husband and a daughter plus family who moved in to help- pt has poor vision, cannot drive and relies on family to help her as needed.          SDOH assessments and interventions completed:  Yes     Care Coordination Interventions:  Yes, provided   Follow up plan:  Follow up call scheduled for 08/19/22    Encounter Outcome:  Pt. Visit Completed

## 2022-08-05 NOTE — Telephone Encounter (Signed)
Per note on 07/23/22, Novolog was approved through 06/2023

## 2022-08-05 NOTE — Patient Instructions (Signed)
Visit Information  Thank you for taking time to visit with me today. Please don't hesitate to contact me if I can be of assistance to you.   Following are the goals we discussed today:   Goals Addressed             This Visit's Progress    Depression/Adjustment to life changes       Care Coordination Interventions: Pt reports today she continues to find challenges with depression and anxiety when "everyone is here". Having her daughter and her family (3 children) makes her feel "overwhelmed". CSW validated the environment changes at home with the additional family member living there. Pt finds ways to escape and distract herself through headphones, going to her room, etc. She has been listening to some meditations on Youtube that she enjoys.  Pt expressed a feeling of "losing my freedom"- discussed further possible volunteer opportunities- schools, nursing home, etc and feels maybe working with animals might be more comfortable for her- encouraged her to look into this as she wishes. Pt  continues to learn and manage her DM pump- "it's a learning curve". Will ask RNCM to reach out to assess and support as appropriate.  Pt does not have a follow up visit with PCP scheduled- CSW will message PCP to alert as PCP indicated he planned to follow up in a few weeks for follow up and to discuss mental health needs/RX treatment options.  CSW previously spoke with pt by phone on 07/29/22 who reports she was diagnosed with CHF in July, 2022 and has felt her depression has worsened. Pt denies SI/HI- reports no history of SI or hospitalizations for mental health reasons.  She does share that she tried a RX in her 48's but does not recall what. Pt also shared she has anxiety and sometimes feels like she is having panic attacks-. Discussed relaxation/breathing techniques which pt is familiar with- "I do the 4,3,7' (inhale 4 seconds, hold it for 3 seconds and release for 7 seconds). Encouraged pt to continue with  this and to try other techniques discussed as well (imagery, re-directing, etc) Pt indicates she is not on any RX for depression or anxiety- she is open to considering this and agrees to [place] collaborating with PCP to consider options. CSW also encouraged pt to consider counseling/support.  Pt is interested in seeking some type of employment that she can do with her medical conditions- suggested she seek options for job as well as consider volunteering.   Motivational Interviewing employed Depression screen reviewed  PHQ2/ PHQ9 completed Pt reports difficulty with sleep, energy and PO intake Solution-Focused Strategies employed:  Mindfulness or Relaxation training provided Active listening / Reflection utilized  Problem [place] strategies reviewed Provided general psycho-education for mental health needs  Quality of sleep assessed & Sleep Hygiene techniques promoted  Participation in counseling encouraged- limited options for in-person due to location of her residence but will consider- maybe can do virtual and/or through [facility] office Therapist. Pt has good family support- husband and a daughter plus family who moved in to help- pt has poor vision, cannot drive and relies on family to help her as needed.          Our next appointment is by telephone on 08/19/22   Please call the care guide team at [phone] if you need to cancel or reschedule your appointment.   If you are experiencing a Mental Health or [place] or need someone to talk to, please call  the Suicide and Crisis Lifeline: 988 call the [place] National Suicide Prevention Lifeline: 919-515-5871 or TTY: 607-735-3773 TTY 4503338562) to talk to a trained counselor call 911   Patient verbalizes understanding of instructions and care plan provided today and agrees to view in MyChart. Active MyChart status and patient understanding of how to access instructions and care plan via MyChart confirmed with  patient.     Telephone follow up appointment with care management team member scheduled for:08/19/22  Reece Levy MSW, LCSW Licensed Clinical Social Worker   [phone]

## 2022-08-05 NOTE — Telephone Encounter (Signed)
Spoke with [employer], the order form is sufficient and they do not need orders for her supplies or the control IQ . The Novolog vials need to [facility] [place].

## 2022-08-06 ENCOUNTER — Other Ambulatory Visit: Payer: Self-pay | Admitting: Student

## 2022-08-06 DIAGNOSIS — E1059 Type 1 diabetes mellitus with other circulatory complications: Secondary | ICD-10-CM

## 2022-08-06 MED ORDER — T:SLIM X2/CONTROL-IQ/ACC/INSTR MISC: 1 refills | Status: DC

## 2022-08-06 MED ORDER — T:SLIM X2 3ML CARTRIDGE MISC: 4 refills | Status: DC

## 2022-08-06 MED ORDER — INSULIN ASPART 100 UNIT/ML IJ SOLN: INTRAMUSCULAR | 11 refills | Status: DC

## 2022-08-06 MED ORDER — "PARADIGM QUICK-SET 32"" 6MM MISC": 4 refills | Status: DC

## 2022-08-06 NOTE — Progress Notes (Signed)
  Care Coordination Note  08/06/2022 Name: Amanda Frederick MRN: [id number] DOB: 10/22/1970  Amanda Frederick is a 52 y.o. year old female who is a primary care patient of [name], DO and is actively engaged with the care management team. I reached out to Amanda Frederick by phone today to assist with scheduling an initial visit with the RN Case Manager  Follow up plan: Telephone appointment with care management team member scheduled for:08/14/22  [place]  Direct Dial: [phone]

## 2022-08-08 ENCOUNTER — Ambulatory Visit (HOSPITAL_COMMUNITY)
Admission: RE | Admit: 2022-08-08 | Discharge: 2022-08-08 | Disposition: A | Discharge status: Home / Self Care | Payer: Commercial Managed Care - HMO | Source: Ambulatory Visit | Attending: Cardiology | Admitting: Cardiology

## 2022-08-08 DIAGNOSIS — I5022 Chronic systolic (congestive) heart failure: Secondary | ICD-10-CM | POA: Insufficient documentation

## 2022-08-08 LAB — BASIC METABOLIC PANEL
Anion gap: 9 (ref 5–15)
BUN: 8 mg/dL (ref 6–20)
CO2: 23 mmol/L (ref 22–32)
Calcium: 8.7 mg/dL — ABNORMAL LOW (ref 8.9–10.3)
Chloride: 103 mmol/L (ref 98–111)
Creatinine, Ser: 0.76 mg/dL (ref 0.44–1.00)
GFR, Estimated: >60 mL/min (ref >60)
Glucose, Bld: 249 mg/dL — ABNORMAL HIGH (ref 70–99)
Potassium: 3.4 mmol/L — ABNORMAL LOW (ref 3.5–5.1)
Sodium: 135 mmol/L (ref 135–145)

## 2022-08-10 ENCOUNTER — Other Ambulatory Visit: Payer: Self-pay | Admitting: Gastroenterology

## 2022-08-12 LAB — PANCREATIC ELASTASE, FECAL: Pancreatic Elastase-1, Stool: 262 mcg/g

## 2022-08-12 LAB — FECAL LACTOFERRIN, QUANT
Fecal Lactoferrin: NEGATIVE
MICRO NUMBER:: 14394237
SPECIMEN QUALITY:: ADEQUATE

## 2022-08-13 NOTE — Progress Notes (Signed)
[name],  Your tests for pancreatic insufficiency and for inflammatory changes in the stool were normal.  Your GI symptoms may be more consistent with IBS.  Please continue to take the loperamide and Bentyl as discussed and follow up in March to reconsider endoscopic evaluation.

## 2022-08-14 ENCOUNTER — Other Ambulatory Visit (HOSPITAL_COMMUNITY): Payer: Self-pay

## 2022-08-14 ENCOUNTER — Telehealth: Payer: Self-pay

## 2022-08-14 NOTE — Telephone Encounter (Signed)
Rosendo Gros initiated a PA today for [employer]. Call placed to [facility] to find out how many vials insurance will pay for. States they will only cover 10 mL (1 vial) every 10 days, which is 3 vials per month. He recommends waiting on PA so she can get the full amount.  Call placed to patient. States she is on her last vial. States she uses at least 65 units 4 times per day which is 260 mL minimum. Will ask PA Coordinator if PA can be expedited.

## 2022-08-15 ENCOUNTER — Other Ambulatory Visit (HOSPITAL_COMMUNITY): Payer: Self-pay

## 2022-08-15 ENCOUNTER — Telehealth: Payer: Self-pay

## 2022-08-15 NOTE — Telephone Encounter (Signed)
Original PA was fixed. Coverage now for 6 vials for 30 day supply.  Patients copay is now $432. Attempting to look into copay card since she doesn't qualify for PAP.

## 2022-08-15 NOTE — Telephone Encounter (Signed)
Spoke with pharmacist.  Copay card available (pharmacist had already applied it to her order) but it only covers 1 vial at a time. 1 vial is a 5 day supply & will have a $99 copay.  Patients insurance deductible seems to be the issue. She may want to reach out to them about her options. Also, novolog seems to be the only brand her insurance will cover per pharmacist.

## 2022-08-15 NOTE — Telephone Encounter (Signed)
UPDATE :   APPROVED  from 08/15/22  through  08/15/2023      ( COPY PLACED TO BE SCANNED TO CHART  )

## 2022-08-15 NOTE — Patient Outreach (Signed)
  Care Coordination   08/15/2022 Late Entry Name: Amanda Frederick MRN: [id number] DOB: September 16, 1970   Care Coordination Outreach Attempts:  An unsuccessful telephone outreach was attempted today to offer the patient information about available care coordination services as a benefit of their health plan.   Follow Up Plan:  Additional outreach attempts will be made to offer the patient care coordination information and services.   Encounter Outcome:  Pt. Visit Completed   Care Coordination Interventions:  No, not indicated    Lazaro Arms RN, BSN, [place] [facility]   Phone: [phone]

## 2022-08-17 ENCOUNTER — Other Ambulatory Visit (HOSPITAL_COMMUNITY): Payer: Self-pay | Admitting: Internal Medicine

## 2022-08-19 ENCOUNTER — Ambulatory Visit: Payer: Self-pay | Admitting: *Deleted

## 2022-08-19 NOTE — Patient Instructions (Signed)
Visit Information  Thank you for taking time to visit with me today. Please don't hesitate to contact me if I can be of assistance to you.   Following are the goals we discussed today:   Goals Addressed             This Visit's Progress    Depression/Adjustment to life changes       Care Coordination Interventions: Pt reports she is doing somewhat better- still has some feelings of being overwhelmed with all the family in her home-  Having her daughter and her family (3 children) makes her feel "overwhelmed" and loss of space/freedom. Pt has begun to do some "seed gardening" in the home which she is "excited about doing". She has also got her daughter doing it too and is impressed with her interest and abilities.  CSW validated the environment changes at home with the additional family members living there. Pt continues to find ways to escape and distract herself through headphones, going to her room, etc. She has been listening to some meditations on Youtube that she enjoys.  Pt previously had expressed a feeling of "losing my freedom"- discussed further possible volunteer opportunities- schools, nursing home, etc and feels maybe working with animals might be more comfortable for her- encouraged her to look into this as she wishes- pt has not found anything as of yet. Pt  reports struggling to get her Novolog-stating she is limited to 3 per month which is not enough she says- resolution underway with PCP and Insurance..... Pt has still not heard from the Endocrinologist her PCP placed referral to back in December- CSW assisted pt with conference call to [facility] to inquire- await call back Pt plans to see PCP 08/22/22- Pt does not have a follow up visit with PCP scheduled- CSW will message PCP to alert as PCP indicated he planned to follow up in a few weeks for follow up and to discuss mental health needs/RX treatment options. New pump not received yet-   CSW previously spoke  with pt by phone on 07/29/22 who reports she was diagnosed with CHF in July, 2022 and has felt her depression has worsened. Pt denies SI/HI- reports no history of SI or hospitalizations for mental health reasons.  She does share that she tried a RX in her 44's but does not recall what. Pt also shared she has anxiety and sometimes feels like she is having panic attacks-. Discussed relaxation/breathing techniques which pt is familiar with- "I do the 4,3,7' (inhale 4 seconds, hold it for 3 seconds and release for 7 seconds). Encouraged pt to continue with this and to try other techniques discussed as well (imagery, re-directing, etc) Pt indicates she is not on any RX for depression or anxiety- she is open to considering this and agrees to [place] collaborating with PCP to consider options. CSW also encouraged pt to consider counseling/support.   Motivational Interviewing employed Depression better with the inside gardening she enjoys Pt reports difficulty with sleep, energy and PO intake Solution-Focused Strategies employed:  Mindfulness or Relaxation training provided Active listening / Reflection utilized  [place] strategies reviewed Provided general psycho-education for mental health needs  Quality of sleep assessed & Sleep Hygiene techniques promoted  Participation in counseling encouraged- limited options for in-person due to location of her residence but will consider- maybe can do virtual and/or through [facility] office Therapist. Pt has good family support- husband and a daughter plus family who moved in to help- pt  has poor vision, cannot drive and relies on family to help her as needed.          Our next appointment is by telephone on 08/25/22  Please call the care guide team at [phone] if you need to cancel or reschedule your appointment.   If you are experiencing a Mental Health or [place] or need someone to talk to, please call the Suicide and Crisis  Lifeline: 988 call 911   The patient verbalized understanding of instructions, educational materials, and care plan provided today and DECLINED offer to receive copy of patient instructions, educational materials, and care plan.   Telephone follow up appointment with care management team member scheduled for:08/25/22  Eduard Clos, MSW, [place] Worker [employer] [phone]

## 2022-08-19 NOTE — Patient Outreach (Signed)
Care Coordination   Follow Up Visit Note   08/19/2022 Name: Amanda Frederick MRN: [id number] DOB: January 25, 1971  Amanda Frederick is a 52 y.o. year old female who sees [name], DO for primary care. I spoke with  [name] by phone today.  What matters to the patients health and wellness today?  Need to get connected with new Endocrinologist    Goals Addressed             This Visit's Progress    Depression/Adjustment to life changes       Care Coordination Interventions: Pt reports she is doing somewhat better- still has some feelings of being overwhelmed with all the family in her home-  Having her daughter and her family (3 children) makes her feel "overwhelmed" and loss of space/freedom. Pt has begun to do some "seed gardening" in the home which she is "excited about doing". She has also got her daughter doing it too and is impressed with her interest and abilities.  CSW validated the environment changes at home with the additional family members living there. Pt continues to find ways to escape and distract herself through headphones, going to her room, etc. She has been listening to some meditations on Youtube that she enjoys.  Pt previously had expressed a feeling of "losing my freedom"- discussed further possible volunteer opportunities- schools, nursing home, etc and feels maybe working with animals might be more comfortable for her- encouraged her to look into this as she wishes- pt has not found anything as of yet. Pt  reports struggling to get her Novolog-stating she is limited to 3 per month which is not enough she says- resolution underway with PCP and Insurance..... Pt has still not heard from the Endocrinologist her PCP placed referral to back in December- CSW assisted pt with conference call to [facility] to inquire- await call back Pt plans to see PCP 08/22/22- Pt does not have a follow up visit with PCP scheduled- CSW will message PCP to alert as PCP  indicated he planned to follow up in a few weeks for follow up and to discuss mental health needs/RX treatment options. New pump not received yet-   CSW previously spoke with pt by phone on 07/29/22 who reports she was diagnosed with CHF in July, 2022 and has felt her depression has worsened. Pt denies SI/HI- reports no history of SI or hospitalizations for mental health reasons.  She does share that she tried a RX in her 68's but does not recall what. Pt also shared she has anxiety and sometimes feels like she is having panic attacks-. Discussed relaxation/breathing techniques which pt is familiar with- "I do the 4,3,7' (inhale 4 seconds, hold it for 3 seconds and release for 7 seconds). Encouraged pt to continue with this and to try other techniques discussed as well (imagery, re-directing, etc) Pt indicates she is not on any RX for depression or anxiety- she is open to considering this and agrees to [place] collaborating with PCP to consider options. CSW also encouraged pt to consider counseling/support.   Motivational Interviewing employed Depression better with the inside gardening she enjoys Pt reports difficulty with sleep, energy and PO intake Solution-Focused Strategies employed:  Mindfulness or Relaxation training provided Active listening / Reflection utilized  Problem [place] strategies reviewed Provided general psycho-education for mental health needs  Quality of sleep assessed & Sleep Hygiene techniques promoted  Participation in counseling encouraged- limited options for in-person due to location of her residence but  will consider- maybe can do virtual and/or through [facility] office Therapist. Pt has good family support- husband and a daughter plus family who moved in to help- pt has poor vision, cannot drive and relies on family to help her as needed.          SDOH assessments and interventions completed:  Yes     Care Coordination Interventions:  Yes, provided   Follow  up plan: Follow up call scheduled for 08/25/22    Encounter Outcome:  Pt. Visit Completed

## 2022-08-22 ENCOUNTER — Ambulatory Visit: Payer: Commercial Managed Care - HMO | Admitting: Student

## 2022-08-22 ENCOUNTER — Encounter: Payer: Self-pay | Admitting: Student

## 2022-08-22 VITALS — BP 144/96 | HR 89 | Temp 98.1°F | Ht 66.0 in | Wt 169.4 lb

## 2022-08-22 DIAGNOSIS — E1059 Type 1 diabetes mellitus with other circulatory complications: Secondary | ICD-10-CM

## 2022-08-22 LAB — GLUCOSE, CAPILLARY: Glucose-Capillary: 491 mg/dL — ABNORMAL HIGH (ref 70–99)

## 2022-08-22 LAB — POCT GLYCOSYLATED HEMOGLOBIN (HGB A1C): Hemoglobin A1C: 14 % — AB (ref 4.0–5.6)

## 2022-08-22 MED ORDER — INSULIN ASPART 100 UNIT/ML IJ SOLN: INTRAMUSCULAR | 11 refills | Status: DC

## 2022-08-22 MED ORDER — INSULIN DETEMIR 100 UNIT/ML FLEXPEN: 40.0000 [IU] | PEN_INJECTOR | Freq: Every day | SUBCUTANEOUS | 5 refills | Status: DC

## 2022-08-22 NOTE — Patient Instructions (Signed)
Thank you, Ms.[name] for allowing [place] to provide your care today. Today we discussed your diabetes and insulin pump.    I have ordered the following labs for you:   Lab Orders         Glucose, capillary         POC Hbg A1C      I have place a referrals to Diabetic Coordinator  I have ordered the following medication/changed the following medications:  Start Levemir 40 units daily For Novolog, Inject 10 units with meals three times a day. Inject 20 units for BS > 400, 15 units for BS 300-399, 10 units for BS  200-299 and 5 units for BS 100-199   My Chart Access: [email or web address]  Please follow-up in 2 weeks  Please make sure to arrive 15 minutes prior to your next appointment. If you arrive late, you may be asked to reschedule.    We look forward to seeing you next time. Please call our clinic at [phone] if you have any questions or concerns. The best time to call is Monday-Friday from 9am-4pm, but there is someone available 24/7. If after hours or the weekend, call the main hospital number and ask for the Internal Medicine Resident On-Call. If you need medication refills, please notify your pharmacy one week in advance and they will send [place] a request.   Thank you for letting us take part in your care. Wishing you the best!  Lacinda Axon, MD 08/22/2022, 10:01 AM IM Resident, PGY-3 [place]ian 41:10

## 2022-08-22 NOTE — Progress Notes (Unsigned)
CC: Diabetes follow up  HPI:  [name] is a 52 y.o. female with PMH as stated below who presents to clinic to follow up on her diabetes. Please see problem based charting for evaluation, assessment and plan.  Past Medical History:  Diagnosis Date   AC (acromioclavicular) joint bone spurs    Anemia    Anxiety    Arthritis    Bronchitis    Bulging lumbar disc    Bursitis    CHF (congestive heart failure) (HCC)    Complication of anesthesia    "has died 3 separate times under anesthesia"   Depression    Diabetes ([place])    Environmental allergies    Fibromyalgia    High blood pressure    High cholesterol    History of COVID-19 01/2020   was hospitalized   History of endometriosis    History of uterine fibroid    Migraines    Neuropathy    eyes , hands and feet    Pinched nerve    Pneumonia    Seizure ([place])    after eye test only   Spondylosis    Tachycardia    after COVID   Tendonitis    Vision abnormalities     Review of Systems:  Constitutional: Positive for fatigue.  Eyes: Positive for blurry vision Cardiac: Negative for chest pain MSK: Negative for back pain Abdomen: Negative for abdominal pain, constipation or diarrhea Neuro: Positive for occasional dizziness, headache and numbness. Negative for weakness  Physical Exam: General: Pleasant, well-appearing middle-age woman. No acute distress. Cardiac: RRR. No murmurs, rubs or gallops. No LE edema Respiratory: Lungs CTAB. No wheezing or crackles. Abdominal: Soft, symmetric and non tender. Normal BS. Skin: Warm, dry and intact without rashes or lesions Extremities: Atraumatic. Full ROM. Palpable radial and DP pulses. Neuro: A&O x 3. Moves all extremities. Decreased sensation to both foot.  Psych: Appropriate mood and affect.  Vitals:   08/22/22 0908 08/22/22 0947  BP: (!) 141/78 (!) 144/96  Pulse: 83 89  Temp: 98.1 F (36.7 C)   TempSrc: Oral   SpO2: 99%   Weight: 169 lb 6.4 oz (76.8 kg)    Height: 5\' 6"  (1.676 m)      Assessment & Plan:   DM (diabetes mellitus), type 1 ([place]) Patient here for follow-up on her diabetes after establishing care in our clinic last month. Patient reports she was unable to get her insulin pump after her previous PCP retired. The pump was ordered during her last office visit however patient states she has been speaking with the insurance company and they still have not sent her to pump. She has had a difficult time managing her blood sugars with only short acting insulin. She is having to inject more than 100 units of NovoLog daily but her blood sugars are still not very well-controlled and consistently in the 400s to 500s. A1c today is 14%. On chart review, patient's A1c was well-controlled in the 6-7% while patient was on her insulin pump. Will request the assistance of our care coordinator to help patient get her insulin pump. For now, discussed with patient plan to start her on a long-acting insulin in addition to her mealtime insulin and sliding scale.   Plan: -Start Levemir 40 units daily -Change NovoLog to 10 units 3 times daily with meals with sliding scale as instructed (Inject 20 units for BS > 400, 15 units for BS 300-399, 10 units for BS 200-299 and 5 units for  BS 100-199) -Referral to community care coordinator -Referral to diabetic educator for nutrition counseling -Referral to ophthalmology for diabetic eye exam -Follow-up in 2 weeks for reevaluation and lab work    See Encounters Tab for problem based charting.  Patient discussed with Dr. Gardiner Sleeper, MD, MPH

## 2022-08-24 ENCOUNTER — Encounter: Payer: Self-pay | Admitting: Student

## 2022-08-24 NOTE — Assessment & Plan Note (Addendum)
Patient here for follow-up on her diabetes after establishing care in our clinic last month. Patient reports she was unable to get her insulin pump after her previous PCP retired. The pump was ordered during her last office visit however patient states she has been speaking with the insurance company and they still have not sent her to pump. She has had a difficult time managing her blood sugars with only short acting insulin. She is having to inject more than 100 units of NovoLog daily but her blood sugars are still not very well-controlled and consistently in the 400s to 500s. A1c today is 14%. On chart review, patient's A1c was well-controlled in the 6-7% while patient was on her insulin pump. Will request the assistance of our care coordinator to help patient get her insulin pump. For now, discussed with patient plan to start her on a long-acting insulin in addition to her mealtime insulin and sliding scale.   Plan: -Start Levemir 40 units daily -Change NovoLog to 10 units 3 times daily with meals with sliding scale as instructed (Inject 20 units for BS > 400, 15 units for BS 300-399, 10 units for BS 200-299 and 5 units for BS 100-199) -Referral to community care coordinator -Referral to diabetic educator for nutrition counseling -Referral to ophthalmology for diabetic eye exam -Follow-up in 2 weeks for reevaluation and lab work

## 2022-08-25 ENCOUNTER — Telehealth: Payer: Self-pay | Admitting: *Deleted

## 2022-08-25 ENCOUNTER — Ambulatory Visit: Payer: Self-pay | Admitting: *Deleted

## 2022-08-25 ENCOUNTER — Telehealth: Payer: Self-pay

## 2022-08-25 NOTE — Telephone Encounter (Signed)
Call to [employer] gave them information that patient has allergy to Humalog.  Patient will be able to receive without a copay. Call to pharmacy patient has still not been approved.  RTC to [employer] to call Pharmacy to get PA for patient.

## 2022-08-25 NOTE — Telephone Encounter (Signed)
Decision:Denied [name] (Key: [id number]) PA Case ID #: [id number] Rx #: [id number] Need Help? Call us at [phone] Outcome Denied today CaseId:[id number];Status:Denied;Review Type:;Appeal Information: [place] [id number]; Important - Please read the below note on eAppeals: Please reference the denial letter for information on the rights for an appeal, rationale for the denial, and how to submit an appeal including if any information is needed to support the appeal. Note about urgent situations - Generally, an urgent situation is one which, in the opinion of the provider, the health of the patient may be in serious jeopardy or may experience pain that cannot be adequately controlled while waiting for a decision on the appeal.; Drug Levemir FlexPen 100UNIT/ML pen-injectors ePA cloud Secretary/administrator PA Form (2017 NCPDP) Original Claim Info 18 MD [place]

## 2022-08-25 NOTE — Telephone Encounter (Signed)
Prior Authorization for patient (Levemir Flexpen) came through on cover my meds was submitted with last office notes and labs awaiting approval or denial.

## 2022-08-25 NOTE — Patient Instructions (Signed)
Visit Information  Thank you for taking time to visit with me today. Please don't hesitate to contact me if I can be of assistance to you.   Following are the goals we discussed today:   Goals Addressed             This Visit's Progress    Depression/Adjustment to life changes       Care Coordination Interventions: Pt reports she is doing ok- frustrated and at times overwhelmed by with things- especially with insurance issues related to medication coverage/costs and getting a new pump.  Someone suggested to her to call the Dept of Insurance to voice complaint about what her coverage says -vs- what they are saying in regards to her insulin being a "Tier5 drug". CSW provided pt with # to DOI.  Pt acknowledges continued depression and some anxiety- difficulty with sleep and also poor appetite; "I only eat dinner and that's because my husband makes me"- CSCW has placed [facility] consult pending for later this week Pt's depression screening today showing worsening symptoms- since last assessed 3 days ago- going from "14" to "59"- pt is open to considering RX/anti-depressant and CSW will communicate with PCP office again regarding this- pt to be seen by PCP on 09/05/22.    08/25/2022    1:25 PM 08/22/2022   11:15 AM 07/29/2022    1:02 PM 07/09/2022   12:31 PM  Depression screen PHQ 2/9  Decreased Interest 3 3 3 3   Down, Depressed, Hopeless 1 0 2 1  PHQ - 2 Score 4 3 5 4   Altered sleeping 3 3 3 3   Tired, decreased energy 3 3 3 3   Change in appetite 3 3 3 3   Feeling bad or failure about yourself  1 1 1  0  Trouble concentrating 2 1 1 1   Moving slowly or fidgety/restless 2 0 0 0  Suicidal thoughts 0 0 0 0  PHQ-9 Score 18 14 16 14   Difficult doing work/chores Very difficult Very difficult Very difficult Somewhat difficult    Pt previously shared she has some feelings of being overwhelmed with all the family in her home-  Having her daughter and her family (3 children) makes her feel "overwhelmed" and  loss of space/freedom. Pt has begun to do some "seed gardening" in the home which she is "excited about doing". She has also got her daughter doing it too and is impressed with her interest and abilities.  CSW validated the environment changes at home with the additional family members living there. Pt continues to find ways to escape and distract herself through headphones, going to her room, etc. She has been listening to some meditations on Youtube that she enjoys.  Pt previously had expressed a feeling of "losing my freedom"- discussed further possible volunteer opportunities- schools, nursing home, etc and feels maybe working with animals might be more comfortable for her- encouraged her to look into this as she wishes- pt has not found anything as of yet. Pt  reports struggling to get her Novolog-stating she is limited to 3 per month which is not enough she says- resolution underway with PCP and Insurance..... New pump not received yet-   CSW previously spoke with pt by phone on 07/29/22 who reports she was diagnosed with CHF in July, 2022 and has felt her depression has worsened. Pt denies SI/HI- reports no history of SI or hospitalizations for mental health reasons.  She does share that she tried a RX in her 37's but does  not recall what. Pt also shared she has anxiety and sometimes feels like she is having panic attacks-. Discussed relaxation/breathing techniques which pt is familiar with- "I do the 4,3,7' (inhale 4 seconds, hold it for 3 seconds and release for 7 seconds). Encouraged pt to continue with this and to try other techniques discussed as well (imagery, re-directing, etc) Pt indicates she is not on any RX for depression or anxiety- she is open to considering this and agrees to [place] collaborating with PCP to consider options. CSW also encouraged pt to consider counseling/support.   Motivational Interviewing employed Depression better with the inside gardening she enjoys Pt reports  difficulty with sleep, energy and PO intake Solution-Focused Strategies employed:  Mindfulness or Relaxation training provided Active listening / Reflection utilized  [place] strategies reviewed Provided general psycho-education for mental health needs  Quality of sleep assessed & Sleep Hygiene techniques promoted  Participation in counseling encouraged- limited options for in-person due to location of her residence but will consider- maybe can do virtual and/or through [facility] office Therapist. Pt has good family support- husband and a daughter plus family who moved in to help- pt has poor vision, cannot drive and relies on family to help her as needed.          Our next appointment is by telephone on 09/04/22 Please call the care guide team at [phone] if you need to cancel or reschedule your appointment.   If you are experiencing a Mental Health or [place] or need someone to talk to, please call the Suicide and Crisis Lifeline: 988 call 911   Patient verbalizes understanding of instructions and care plan provided today and agrees to view in [place]. Active MyChart status and patient understanding of how to access instructions and care plan via MyChart confirmed with patient.     Telephone follow up appointment with care management team member scheduled for: 09/04/22  Eduard Clos, MSW, [place] [facility] [employer] [facility] [phone]

## 2022-08-25 NOTE — Telephone Encounter (Signed)
Received a request for Levemir from the pharmacy,PA was submitted today.

## 2022-08-25 NOTE — Telephone Encounter (Signed)
Call from patient her insurance has Novolog as a Tier 5  medication will not pay for.  Patient needs an exception saying that it needs to be a Tier 3 that the insurance will pay for.  Patient unable to use Humalog had a an allergic reaction too.   Patient's insurance can be reached at [phone].

## 2022-08-25 NOTE — Patient Outreach (Signed)
Care Coordination   Follow Up Visit Note   08/25/2022 Name: Amanda Frederick MRN: [id number] DOB: 1971/04/06  Amanda Frederick is a 52 y.o. year old female who sees [name], DO for primary care. I spoke with  Amanda Frederick by phone today.  What matters to the patients health and wellness today?  Health and mental health wellness.    Goals Addressed             This Visit's Progress    Depression/Adjustment to life changes       Care Coordination Interventions: Pt reports she is doing ok- frustrated and at times overwhelmed by with things- especially with insurance issues related to medication coverage/costs and getting a new pump.  Someone suggested to her to call the Dept of Insurance to voice complaint about what her coverage says -vs- what they are saying in regards to her insulin being a "Tier5 drug". CSW provided pt with # to DOI.  Pt acknowledges continued depression and some anxiety- difficulty with sleep and also poor appetite; "I only eat dinner and that's because my husband makes me"- CSCW has placed [facility] consult pending for later this week Pt's depression screening today showing worsening symptoms- since last assessed 3 days ago- going from "14" to "77"- pt is open to considering RX/anti-depressant and CSW will communicate with PCP office again regarding this- pt to be seen by PCP on 09/05/22.    08/25/2022    1:25 PM 08/22/2022   11:15 AM 07/29/2022    1:02 PM 07/09/2022   12:31 PM  Depression screen PHQ 2/9  Decreased Interest 3 3 3 3   Down, Depressed, Hopeless 1 0 2 1  PHQ - 2 Score 4 3 5 4   Altered sleeping 3 3 3 3   Tired, decreased energy 3 3 3 3   Change in appetite 3 3 3 3   Feeling bad or failure about yourself  1 1 1  0  Trouble concentrating 2 1 1 1   Moving slowly or fidgety/restless 2 0 0 0  Suicidal thoughts 0 0 0 0  PHQ-9 Score 18 14 16 14   Difficult doing work/chores Very difficult Very difficult Very difficult Somewhat difficult    Pt previously shared  she has some feelings of being overwhelmed with all the family in her home-  Having her daughter and her family (3 children) makes her feel "overwhelmed" and loss of space/freedom. Pt has begun to do some "seed gardening" in the home which she is "excited about doing". She has also got her daughter doing it too and is impressed with her interest and abilities.  CSW validated the environment changes at home with the additional family members living there. Pt continues to find ways to escape and distract herself through headphones, going to her room, etc. She has been listening to some meditations on Youtube that she enjoys.  Pt previously had expressed a feeling of "losing my freedom"- discussed further possible volunteer opportunities- schools, nursing home, etc and feels maybe working with animals might be more comfortable for her- encouraged her to look into this as she wishes- pt has not found anything as of yet. Pt  reports struggling to get her Novolog-stating she is limited to 3 per month which is not enough she says- resolution underway with PCP and Insurance..... New pump not received yet-   CSW previously spoke with pt by phone on 07/29/22 who reports she was diagnosed with CHF in July, 2022 and has felt her depression has worsened. Pt denies  SI/HI- reports no history of SI or hospitalizations for mental health reasons.  She does share that she tried a RX in her 73's but does not recall what. Pt also shared she has anxiety and sometimes feels like she is having panic attacks-. Discussed relaxation/breathing techniques which pt is familiar with- "I do the 4,3,7' (inhale 4 seconds, hold it for 3 seconds and release for 7 seconds). Encouraged pt to continue with this and to try other techniques discussed as well (imagery, re-directing, etc) Pt indicates she is not on any RX for depression or anxiety- she is open to considering this and agrees to [place] collaborating with PCP to consider options. CSW also  encouraged pt to consider counseling/support.   Motivational Interviewing employed Depression better with the inside gardening she enjoys Pt reports difficulty with sleep, energy and PO intake Solution-Focused Strategies employed:  Mindfulness or Relaxation training provided Active listening / Reflection utilized  [place] strategies reviewed Provided general psycho-education for mental health needs  Quality of sleep assessed & Sleep Hygiene techniques promoted  Participation in counseling encouraged- limited options for in-person due to location of her residence but will consider- maybe can do virtual and/or through [facility] office Therapist. Pt has good family support- husband and a daughter plus family who moved in to help- pt has poor vision, cannot drive and relies on family to help her as needed.          SDOH assessments and interventions completed:  Yes     Care Coordination Interventions:  Yes, provided   Follow up plan: Follow up call scheduled for 09/04/22    Encounter Outcome:  Pt. Visit Completed

## 2022-08-25 NOTE — Patient Outreach (Signed)
Care Coordination   Follow Up Visit Note   08/25/2022 Name: Amanda Frederick MRN: [id number] DOB: 18-Sep-1970  Amanda Frederick is a 52 y.o. year old female who sees Amanda Blamer, DO for primary care. I spoke with  Amanda Frederick by phone today.  What matters to the patients health and wellness today?  Still trying to get her new pump and RX covered; depression/anxiety remain a concern.    Goals Addressed             This Visit's Progress    Depression/Adjustment to life changes       Care Coordination Interventions: Pt reports she is doing ok- frustrated and at times overwhelmed by with things- especially with insurance issues related to medication coverage/costs and getting a new pump.  Someone suggested to her to call the Dept of Insurance to voice complaint about what her coverage says -vs- what they are saying in regards to her insulin being a "Tier5 drug". CSW provided pt with # to DOI.  Pt acknowledges continued depression and some anxiety- difficulty with sleep and also poor appetite; "I only eat dinner and that's because my husband makes me"- CSCW has placed [facility] consult pending for later this week Pt's depression screening today showing worsening symptoms- since last assessed 3 days ago- going from "14" to "51"- pt is open to considering RX/anti-depressant and CSW will communicate with PCP office again regarding this- pt to be seen by PCP on 09/05/22.    08/25/2022    1:25 PM 08/22/2022   11:15 AM 07/29/2022    1:02 PM 07/09/2022   12:31 PM  Depression screen PHQ 2/9  Decreased Interest 3 3 3 3   Down, Depressed, Hopeless 1 0 2 1  PHQ - 2 Score 4 3 5 4   Altered sleeping 3 3 3 3   Tired, decreased energy 3 3 3 3   Change in appetite 3 3 3 3   Feeling bad or failure about yourself  1 1 1  0  Trouble concentrating 2 1 1 1   Moving slowly or fidgety/restless 2 0 0 0  Suicidal thoughts 0 0 0 0  PHQ-9 Score 18 14 16 14   Difficult doing work/chores Very difficult Very difficult Very  difficult Somewhat difficult    Pt previously shared she has some feelings of being overwhelmed with all the family in her home-  Having her daughter and her family (3 children) makes her feel "overwhelmed" and loss of space/freedom. Pt has begun to do some "seed gardening" in the home which she is "excited about doing". She has also got her daughter doing it too and is impressed with her interest and abilities.  CSW validated the environment changes at home with the additional family members living there. Pt continues to find ways to escape and distract herself through headphones, going to her room, etc. She has been listening to some meditations on Youtube that she enjoys.  Pt previously had expressed a feeling of "losing my freedom"- discussed further possible volunteer opportunities- schools, nursing home, etc and feels maybe working with animals might be more comfortable for her- encouraged her to look into this as she wishes- pt has not found anything as of yet. Pt  reports struggling to get her Novolog-stating she is limited to 3 per month which is not enough she says- resolution underway with PCP and Insurance..... New pump not received yet-   CSW previously spoke with pt by phone on 07/29/22 who reports she was diagnosed with CHF in July, 2022  and has felt her depression has worsened. Pt denies SI/HI- reports no history of SI or hospitalizations for mental health reasons.  She does share that she tried a RX in her 18's but does not recall what. Pt also shared she has anxiety and sometimes feels like she is having panic attacks-. Discussed relaxation/breathing techniques which pt is familiar with- "I do the 4,3,7' (inhale 4 seconds, hold it for 3 seconds and release for 7 seconds). Encouraged pt to continue with this and to try other techniques discussed as well (imagery, re-directing, etc) Pt indicates she is not on any RX for depression or anxiety- she is open to considering this and agrees to [place]  collaborating with PCP to consider options. CSW also encouraged pt to consider counseling/support.   Motivational Interviewing employed Depression better with the inside gardening she enjoys Pt reports difficulty with sleep, energy and PO intake Solution-Focused Strategies employed:  Mindfulness or Relaxation training provided Active listening / Reflection utilized  [place] strategies reviewed Provided general psycho-education for mental health needs  Quality of sleep assessed & Sleep Hygiene techniques promoted  Participation in counseling encouraged- limited options for in-person due to location of her residence but will consider- maybe can do virtual and/or through [facility] office Therapist. Pt has good family support- husband and a daughter plus family who moved in to help- pt has poor vision, cannot drive and relies on family to help her as needed.          SDOH assessments and interventions completed:  Yes     Care Coordination Interventions:  Yes, provided   Follow up plan: Follow up call scheduled for 09/04/22    Encounter Outcome:  Pt. Visit Completed

## 2022-08-26 ENCOUNTER — Other Ambulatory Visit: Payer: Self-pay | Admitting: Student

## 2022-08-26 DIAGNOSIS — E1059 Type 1 diabetes mellitus with other circulatory complications: Secondary | ICD-10-CM

## 2022-08-26 NOTE — Telephone Encounter (Signed)
RTC to  

## 2022-08-27 NOTE — Telephone Encounter (Signed)
Rescheduled 08/29/22   [place]  Direct Dial: [phone]

## 2022-08-29 ENCOUNTER — Ambulatory Visit: Payer: Self-pay

## 2022-08-29 NOTE — Patient Instructions (Signed)
Visit Information  Thank you for taking time to visit with me today. Please don't hesitate to contact me if I can be of assistance to you.   Following are the goals we discussed today:   Goals Addressed             This Visit's Progress    Care Coordination Activites- No follolw up       Patient Goals/Self Care Activities: -Patient/Caregiver will self-administer medications as prescribed as evidenced by self-report/primary caregiver report  -Patient/Caregiver will attend all scheduled provider appointments as evidenced by clinician review of documented attendance to scheduled appointments and patient/caregiver report -Patient/Caregiver will call pharmacy for medication refills as evidenced by patient report and review of pharmacy fill history as appropriate -Patient/Caregiver will call provider office for new concerns or questions as evidenced by review of documented incoming telephone call notes and patient report -Patient/Caregiver verbalizes understanding of plan  -check blood sugar at prescribed times -check blood sugar if I feel it is too high or too low -record values and write them down take them to all doctor visits   - Make sure  to vet her new insurance will have everything she needs for her health ie dental, transportation, medications, co-pay, stay with her physicians.          If you are experiencing a Mental Health or [place] or need someone to talk to, please call 1-800-273-TALK (toll free, 24 hour hotline)  Patient verbalizes understanding of instructions and care plan provided today and agrees to view in [place]. Active MyChart status and patient understanding of how to access instructions and care plan via MyChart confirmed with patient.     No further follow up required  Lazaro Arms RN, BSN, [place] [facility]   Phone: [phone]

## 2022-08-29 NOTE — Patient Outreach (Signed)
  Care Coordination   Initial Visit Note   08/29/2022 Name: Amanda Frederick MRN: [id number] DOB: 12/08/1970  Amanda Frederick is a 52 y.o. year old female who sees Amanda Blamer, DO for primary care. I spoke with  Amanda Frederick by phone today.  What matters to the patients health and wellness today?  Amanda Frederick has informed that she is currently facing issues with her insurance and is left without any coverage. Unfortunately, she will have to cancel her appointment with the office on the 9th. Additionally, she has run out of Amanda Frederick. While on the phone, she checked her blood sugar level which was 365 but that was right after eating. I suggested her to inquire if the office may have any samples that could help her with her medication until her new insurance starts in March.    Goals Addressed             This Visit's Progress    Care Coordination Activites- No follolw up       Patient Goals/Self Care Activities: -Patient/Caregiver will self-administer medications as prescribed as evidenced by self-report/primary caregiver report  -Patient/Caregiver will attend all scheduled provider appointments as evidenced by clinician review of documented attendance to scheduled appointments and patient/caregiver report -Patient/Caregiver will call pharmacy for medication refills as evidenced by patient report and review of pharmacy fill history as appropriate -Patient/Caregiver will call provider office for new concerns or questions as evidenced by review of documented incoming telephone call notes and patient report -Patient/Caregiver verbalizes understanding of plan  -check blood sugar at prescribed times -check blood sugar if I feel it is too high or too low -record values and write them down take them to all doctor visits   - Make sure  to vet her new insurance will have everything she needs for her health ie dental, transportation, medications, co-pay, stay with her physicians.         SDOH  assessments and interventions completed:  No     Care Coordination Interventions:  Yes, provided   Interventions Today    Flowsheet Row Most Recent Value  Chronic Disease Discussed/Reviewed   Chronic disease discussed/reviewed during today's visit Diabetes  General Interventions   General Interventions Discussed/Reviewed General Interventions Discussed, General Interventions Reviewed, Labs  Labs Hgb A1c every 3 months  Education Interventions   Education Provided Provided Verbal Education  Provided Verbal Education On Nutrition  Pharmacy Interventions   Pharmacy Dicussed/Reviewed Affording Medications  [Advised the patient to see if the office may have samples to help her until her insurance kicks in March.]        Follow up plan: No further intervention required.   Encounter Outcome:  Pt. Visit Completed   Lazaro Arms RN, BSN, [place] [facility]   Phone: [phone]

## 2022-09-01 ENCOUNTER — Telehealth: Payer: Self-pay

## 2022-09-01 NOTE — Telephone Encounter (Signed)
5 pens of Levemir 100 units/mL and 5 pens of Novolog 100 units/mL prepared and placed on top shelf in med room refrigerator.

## 2022-09-01 NOTE — Progress Notes (Signed)
Internal Medicine Clinic Attending  Case discussed with Dr. A[place]ons[place]  At the time of the visit.  We reviewed the resident's history and exam and pertinent patient test results.  I agree with the assessment, diagnosis, and plan of care documented in the resident's note.  

## 2022-09-01 NOTE — Telephone Encounter (Signed)
Incoming fax from pharmacy . Requesting preferred drug:Humalog 10 ML 100U/ML

## 2022-09-01 NOTE — Telephone Encounter (Signed)
Patient calling in for samples of Levemir and Novolog Flexpens. Husband will p/u later today.

## 2022-09-02 NOTE — Telephone Encounter (Signed)
Patient's husband picked up insulins yesterday at 1630. Handed by Dr. Coy Saunas.

## 2022-09-04 ENCOUNTER — Ambulatory Visit: Payer: Self-pay | Admitting: *Deleted

## 2022-09-04 NOTE — Patient Outreach (Addendum)
Care Coordination   Follow Up Visit Note   09/04/2022 Name: Amanda Frederick MRN: [id number] DOB: 09-23-1970  Amanda Frederick is a 52 y.o. year old female who sees [name], DO for primary care. I spoke with  [name] by phone today.  What matters to the patients health and wellness today?  "I'm getting new insurance that will cover more for me!"    Goals Addressed             This Visit's Progress    Depression/Adjustment to life changes       Care Coordination Interventions: Pt reports she is doing ok- still feeling frustrated and at times overwhelmed by with things-  family remains in the her home with plans to move into their own place but not happening quickly. Discussed possible conversation with daughter and her family about an expected move out date goal or expectation that they move by certain date. Pt is considering this and CSW offered encouragement to feel empowered to do so.  Pt is also pleased that her Insurance broker was able to connect her with a new insurance that "covers my drugs much better and didn't even cost more". Her new insurance with [employer] will go into effect on 09/26/22. Pt cancelled her PCP visit for tomorrow since she is without insurance until 09/26/22 and CSW suggested she call and get on the schedule for March appointment now before they possibly fill up.  Pt also is considering a job where her husband works- someone recently quit and "it's a job I can do and have done in the past". Pt would be able to ride into work and home with her husband and this would offer her some extra income, activity, socialization, purpose and overall help her mental health status. CSW encouraged her to consider it and see about a trial run with it to see if it's a good fit.  Pt also continues with her gardening with starter seeds in the home- she enjoys seeing the growth and finds enjoyment with the activity.     Pt previously shared she has some feelings of being overwhelmed  with all the family in her home-  Having her daughter and her family (3 children) makes her feel "overwhelmed" and loss of space/freedom. Pt has begun to do some "seed gardening" in the home which she is "excited about doing". She has also got her daughter doing it too and is impressed with her interest and abilities.  CSW validated the environment changes at home with the additional family members living there. Pt continues to find ways to escape and distract herself through headphones, going to her room, etc. She has been listening to some meditations on Youtube that she enjoys.  Pt previously had expressed a feeling of "losing my freedom"- discussed further possible volunteer opportunities- schools, nursing home, etc and feels maybe working with animals might be more comfortable for her- encouraged her to look into this as she wishes- pt has not found anything as of yet. Pt  reports struggling to get her Novolog-stating she is limited to 3 per month which is not enough she says- resolution underway with PCP and Insurance..... New pump not received yet-   CSW previously spoke with pt by phone on 07/29/22 who reports she was diagnosed with CHF in July, 2022 and has felt her depression has worsened. Pt denies SI/HI- reports no history of SI or hospitalizations for mental health reasons.  She does share that she tried a Careers information officer in  her 20's but does not recall what. Pt also shared she has anxiety and sometimes feels like she is having panic attacks-. Discussed relaxation/breathing techniques which pt is familiar with- "I do the 4,3,7' (inhale 4 seconds, hold it for 3 seconds and release for 7 seconds). Encouraged pt to continue with this and to try other techniques discussed as well (imagery, re-directing, etc) Pt indicates she is not on any RX for depression or anxiety- she is open to considering this and agrees to [place] collaborating with PCP to consider options. CSW also encouraged pt to consider counseling/support.    Motivational Interviewing employed Depression better with the inside gardening she enjoys Pt reports difficulty with sleep, energy and PO intake Solution-Focused Strategies employed:  Mindfulness or Relaxation training provided Active listening / Reflection utilized  [place] strategies reviewed Provided general psycho-education for mental health needs  Quality of sleep assessed & Sleep Hygiene techniques promoted  Participation in counseling encouraged- limited options for in-person due to location of her residence but will consider- maybe can do virtual and/or through [facility] office Therapist. Pt has good family support- husband and a daughter plus family who moved in to help- pt has poor vision, cannot drive and relies on family to help her as needed.          SDOH assessments and interventions completed:  Yes     Care Coordination Interventions:  Yes, provided   Follow up plan: Follow up call scheduled for 09/25/22    Encounter Outcome:  Pt. Visit Completed

## 2022-09-04 NOTE — Patient Instructions (Signed)
Visit Information  Thank you for taking time to visit with me today. Please don't hesitate to contact me if I can be of assistance to you.   Following are the goals we discussed today:   Goals Addressed             This Visit's Progress    Depression/Adjustment to life changes       Care Coordination Interventions: Pt reports she is doing ok- still feeling frustrated and at times overwhelmed by with things-  family remains in the her home with plans to move into their own place but not happening quickly. Discussed possible conversation with daughter and her family about an expected move out date goal or expectation that they move by certain date. Pt is considering this and CSW offered encouragement to feel empowered to do so.  Pt is also pleased that her Insurance broker was able to connect her with a new insurance that "covers my drugs much better and didn't even cost more". Her new insurance with [employer] will go into effect on 09/26/22. Pt cancelled her PCP visit for tomorrow since she is without insurance until 09/26/22 and CSW suggested she call and get on the schedule for March appointment now before they possibly fill up.  Pt also is considering a job where her husband works- someone recently quit and "it's a job I can do and have done in the past". Pt would be able to ride into work and home with her husband and this would offer her some extra income, activity, socialization, purpose and overall help her mental health status. CSW encouraged her to consider it and see about a trial run with it to see if it's a good fit.  Pt also continues with her gardening with starter seeds in the home- she enjoys seeing the growth and finds enjoyment with the activity.     Pt previously shared she has some feelings of being overwhelmed with all the family in her home-  Having her daughter and her family (3 children) makes her feel "overwhelmed" and loss of space/freedom. Pt has begun to do some "seed  gardening" in the home which she is "excited about doing". She has also got her daughter doing it too and is impressed with her interest and abilities.  CSW validated the environment changes at home with the additional family members living there. Pt continues to find ways to escape and distract herself through headphones, going to her room, etc. She has been listening to some meditations on Youtube that she enjoys.  Pt previously had expressed a feeling of "losing my freedom"- discussed further possible volunteer opportunities- schools, nursing home, etc and feels maybe working with animals might be more comfortable for her- encouraged her to look into this as she wishes- pt has not found anything as of yet. Pt  reports struggling to get her Novolog-stating she is limited to 3 per month which is not enough she says- resolution underway with PCP and Insurance..... New pump not received yet-   CSW previously spoke with pt by phone on 07/29/22 who reports she was diagnosed with CHF in July, 2022 and has felt her depression has worsened. Pt denies SI/HI- reports no history of SI or hospitalizations for mental health reasons.  She does share that she tried a RX in her 38's but does not recall what. Pt also shared she has anxiety and sometimes feels like she is having panic attacks-. Discussed relaxation/breathing techniques which pt is familiar with- "I  do the 4,3,7' (inhale 4 seconds, hold it for 3 seconds and release for 7 seconds). Encouraged pt to continue with this and to try other techniques discussed as well (imagery, re-directing, etc) Pt indicates she is not on any RX for depression or anxiety- she is open to considering this and agrees to [place] collaborating with PCP to consider options. CSW also encouraged pt to consider counseling/support.   Motivational Interviewing employed Depression better with the inside gardening she enjoys Pt reports difficulty with sleep, energy and PO intake Solution-Focused  Strategies employed:  Mindfulness or Relaxation training provided Active listening / Reflection utilized  [place] strategies reviewed Provided general psycho-education for mental health needs  Quality of sleep assessed & Sleep Hygiene techniques promoted  Participation in counseling encouraged- limited options for in-person due to location of her residence but will consider- maybe can do virtual and/or through [facility] office Therapist. Pt has good family support- husband and a daughter plus family who moved in to help- pt has poor vision, cannot drive and relies on family to help her as needed.          Our next appointment is by telephone on 09/25/22    Please call the care guide team at [phone] if you need to cancel or reschedule your appointment.   If you are experiencing a Mental Health or [place] or need someone to talk to, please call the Suicide and Crisis Lifeline: 988 call 911   The patient verbalized understanding of instructions, educational materials, and care plan provided today and DECLINED offer to receive copy of patient instructions, educational materials, and care plan.   Telephone follow up appointment with care management team member scheduled for:09/25/22  Eduard Clos, MSW, [place] Worker [employer] [phone]

## 2022-09-05 ENCOUNTER — Encounter: Payer: Commercial Managed Care - HMO | Admitting: Student

## 2022-09-08 ENCOUNTER — Telehealth: Payer: Self-pay | Admitting: Pharmacist

## 2022-09-08 ENCOUNTER — Encounter: Payer: Self-pay | Admitting: Pharmacist

## 2022-09-08 NOTE — Telephone Encounter (Signed)
Called patient at [phone] on 09/08/2022 at 3:31 PM and left HIPAA-compliant VM.  Plan to discuss issues regarding attaining insulin pump.   Sent patient a Mychart message as well.  Will await a response at this time.  Thank you for involving pharmacy/diabetes educator to assist in providing this patient's care.   Drexel Iha, PharmD, BCACP, [place], CPP

## 2022-09-13 ENCOUNTER — Other Ambulatory Visit (HOSPITAL_COMMUNITY): Payer: Self-pay | Admitting: Internal Medicine

## 2022-09-16 ENCOUNTER — Telehealth (HOSPITAL_COMMUNITY): Payer: Self-pay | Admitting: Surgery

## 2022-09-16 NOTE — Telephone Encounter (Signed)
I attempted to reach patient regarding Zio monitor return.  I received an email from company that the device was expected and delayed.  I left a message requesting a call back from patient to indicate if she had returned the monitor.

## 2022-09-17 NOTE — Addendum Note (Signed)
Encounter addended by: Micki Riley, RN on: 09/17/2022 9:30 AM  Actions taken: Imaging Exam ended

## 2022-09-25 ENCOUNTER — Ambulatory Visit: Payer: Self-pay | Admitting: *Deleted

## 2022-09-26 NOTE — Patient Instructions (Signed)
Visit Information  Thank you for taking time to visit with me today. Please don't hesitate to contact me if I can be of assistance to you.   Following are the goals we discussed today:   Goals Addressed             This Visit's Progress    Depression/Adjustment to life changes        Activities and task to complete in order to accomplish goals.    Call your insurance provider for more information about your Enhanced Benefits (glad you have been able to change your insurance to one that covers RX,etc)  Self Support options  (Email me the letter received from DSS so I can review it and help decipher what it is indicating  ) Continue to seek job opportunities as you wish          Our next appointment is by telephone on 10/02/22    Please call the care guide team at [phone] if you need to cancel or reschedule your appointment.   If you are experiencing a Mental Health or [place] or need someone to talk to, please call the Suicide and Crisis Lifeline: 988 call 911   The patient verbalized understanding of instructions, educational materials, and care plan provided today and DECLINED offer to receive copy of patient instructions, educational materials, and care plan.   Telephone follow up appointment with care management team member scheduled for:10/02/22  Eduard Clos, MSW, [place] Worker [employer] [phone]

## 2022-09-26 NOTE — Patient Outreach (Signed)
  Care Coordination   Follow Up Visit Note   09/26/2022 Name: Makiyla Marcella MRN: [id number] DOB: [birth year]  Katianna Culverson is a 52 y.o. year old female who sees [name], DO for primary care. I spoke with  [name] by phone today.  What matters to the patients health and wellness today?  Health and mental health wellness    Goals Addressed             This Visit's Progress    Depression/Adjustment to life changes        Activities and task to complete in order to accomplish goals.    Call your insurance provider for more information about your Enhanced Benefits (glad you have been able to change your insurance to one that covers RX,etc)  Self Support options  (Email me the letter received from DSS so I can review it and help decipher what it is indicating  ) Continue to seek job opportunities as you wish          SDOH assessments and interventions completed:  Yes     Care Coordination Interventions:  Yes, provided  Interventions Today    Flowsheet Row Most Recent Value  Chronic Disease   Chronic disease during today's visit Diabetes  General Interventions   General Interventions Discussed/Reviewed [place] Discussed/Reviewed Mental Health Discussed, Coping Strategies       Follow up plan: Follow up call scheduled for 10/02/22    Encounter Outcome:  Pt. Visit Completed

## 2022-09-30 ENCOUNTER — Ambulatory Visit: Payer: Commercial Managed Care - HMO | Admitting: Gastroenterology

## 2022-10-02 ENCOUNTER — Ambulatory Visit: Payer: Self-pay | Admitting: *Deleted

## 2022-10-02 NOTE — Patient Outreach (Addendum)
  Care Coordination   Follow Up Visit Note   10/02/2022 Name: Amanda Frederick MRN: [id number] DOB: [birth year]  Amanda Frederick is a 52 y.o. year old female who sees Amanda Blamer, DO for primary care. I spoke with  Amanda Frederick by phone today.  What matters to the patients health and wellness today?  "Sick and stuck in the bathroom for a few days"    Goals Addressed             This Visit's Progress    Depression/Adjustment to life changes        Activities and task to complete in order to accomplish goals.    Hope you feel better soon- call PCP if not better tomorrow Call your insurance provider for more information about your Enhanced Benefits (glad you have been able to change your insurance to one that covers RX,etc)  Self Support options  (Email me the letter received from DSS so I can review it and help decipher what it is indicating  ) Continue to seek job opportunities as you wish Keep doing your seed/gardening as you prepare for seasonal enjoyment! Signs/Symptoms of depression remain prevalent- plan to discuss with PCP          SDOH assessments and interventions completed:  Yes  SDOH Interventions Today    Flowsheet Row Most Recent Value  SDOH Interventions   Depression Interventions/Treatment  Medication          10/02/2022    1:07 PM 08/25/2022    1:25 PM 08/22/2022   11:15 AM 07/29/2022    1:02 PM 07/09/2022   12:31 PM  Depression screen PHQ 2/9  Decreased Interest '1 3 3 3 3  '$ Down, Depressed, Hopeless 2 1 0 2 1  PHQ - 2 Score '3 4 3 5 4  '$ Altered sleeping '3 3 3 3 3  '$ Tired, decreased energy '3 3 3 3 3  '$ Change in appetite '2 3 3 3 3  '$ Feeling bad or failure about yourself  '1 1 1 1 '$ 0  Trouble concentrating '1 2 1 1 1  '$ Moving slowly or fidgety/restless 1 2 0 0 0  Suicidal thoughts 0 0 0 0 0  PHQ-9 Score '14 18 14 16 14  '$ Difficult doing work/chores Somewhat difficult Very difficult Very difficult Very difficult Somewhat difficult     Care Coordination  Interventions:  Yes, provided   Interventions Today    Flowsheet Row Most Recent Value  Chronic Disease   Chronic disease during today's visit Diabetes  General Interventions   General Interventions Discussed/Reviewed [place] Discussed/Reviewed Mental Health Discussed, Mental Health Reviewed, Coping Strategies, Depression, Other  [will alert PCP again to the concerns/PHQ9 score and pt willingness to try RX]       Follow up plan: Follow up call scheduled for 10/13/22    Encounter Outcome:  Pt. Visit Completed

## 2022-10-02 NOTE — Patient Instructions (Signed)
Visit Information  Thank you for taking time to visit with me today. Please don't hesitate to contact me if I can be of assistance to you.   Following are the goals we discussed today:   Goals Addressed             This Visit's Progress    Depression/Adjustment to life changes        Activities and task to complete in order to accomplish goals.    Hope you feel better soon- call PCP if not better tomorrow Call your insurance provider for more information about your Enhanced Benefits (glad you have been able to change your insurance to one that covers RX,etc)  Self Support options  (Email me the letter received from DSS so I can review it and help decipher what it is indicating  ) Continue to seek job opportunities as you wish Keep doing your seed/gardening as you prepare for seasonal enjoyment! Signs/Symptoms of depression remain prevalent- plan to discuss with PCP          Our next appointment is by telephone on 10/13/22   Please call the care guide team at [phone] if you need to cancel or reschedule your appointment.   If you are experiencing a Mental Health or [place] or need someone to talk to, please call the Suicide and Crisis Lifeline: 988 call 911   The patient verbalized understanding of instructions, educational materials, and care plan provided today and DECLINED offer to receive copy of patient instructions, educational materials, and care plan.   Telephone follow up appointment with care management team member scheduled for: 10/13/22 Eduard Clos, MSW, [place] Worker [employer] [phone]

## 2022-10-03 ENCOUNTER — Encounter (HOSPITAL_COMMUNITY): Payer: Self-pay | Admitting: Internal Medicine

## 2022-10-03 ENCOUNTER — Telehealth (HOSPITAL_COMMUNITY): Payer: Self-pay

## 2022-10-03 ENCOUNTER — Ambulatory Visit (HOSPITAL_COMMUNITY)
Admission: RE | Admit: 2022-10-03 | Discharge: 2022-10-03 | Disposition: A | Discharge status: Home / Self Care | Payer: 59 | Source: Ambulatory Visit | Attending: Internal Medicine | Admitting: Internal Medicine

## 2022-10-03 VITALS — BP 118/80 | HR 84 | Wt 175.0 lb

## 2022-10-03 DIAGNOSIS — E1040 Type 1 diabetes mellitus with diabetic neuropathy, unspecified: Secondary | ICD-10-CM | POA: Insufficient documentation

## 2022-10-03 DIAGNOSIS — E7800 Pure hypercholesterolemia, unspecified: Secondary | ICD-10-CM | POA: Diagnosis not present

## 2022-10-03 DIAGNOSIS — Z955 Presence of coronary angioplasty implant and graft: Secondary | ICD-10-CM | POA: Diagnosis not present

## 2022-10-03 DIAGNOSIS — M797 Fibromyalgia: Secondary | ICD-10-CM | POA: Diagnosis not present

## 2022-10-03 DIAGNOSIS — I472 Ventricular tachycardia, unspecified: Secondary | ICD-10-CM | POA: Diagnosis not present

## 2022-10-03 DIAGNOSIS — Z794 Long term (current) use of insulin: Secondary | ICD-10-CM | POA: Diagnosis not present

## 2022-10-03 DIAGNOSIS — K529 Noninfective gastroenteritis and colitis, unspecified: Secondary | ICD-10-CM | POA: Insufficient documentation

## 2022-10-03 DIAGNOSIS — J90 Pleural effusion, not elsewhere classified: Secondary | ICD-10-CM | POA: Diagnosis not present

## 2022-10-03 DIAGNOSIS — Z8616 Personal history of COVID-19: Secondary | ICD-10-CM | POA: Insufficient documentation

## 2022-10-03 DIAGNOSIS — E785 Hyperlipidemia, unspecified: Secondary | ICD-10-CM | POA: Insufficient documentation

## 2022-10-03 DIAGNOSIS — Z7982 Long term (current) use of aspirin: Secondary | ICD-10-CM | POA: Diagnosis not present

## 2022-10-03 DIAGNOSIS — I471 Supraventricular tachycardia, unspecified: Secondary | ICD-10-CM | POA: Diagnosis not present

## 2022-10-03 DIAGNOSIS — E109 Type 1 diabetes mellitus without complications: Secondary | ICD-10-CM

## 2022-10-03 DIAGNOSIS — I110 Hypertensive heart disease with heart failure: Secondary | ICD-10-CM | POA: Diagnosis not present

## 2022-10-03 DIAGNOSIS — Z7902 Long term (current) use of antithrombotics/antiplatelets: Secondary | ICD-10-CM | POA: Diagnosis not present

## 2022-10-03 DIAGNOSIS — Z79899 Other long term (current) drug therapy: Secondary | ICD-10-CM | POA: Diagnosis not present

## 2022-10-03 DIAGNOSIS — I5022 Chronic systolic (congestive) heart failure: Secondary | ICD-10-CM | POA: Insufficient documentation

## 2022-10-03 DIAGNOSIS — I2510 Atherosclerotic heart disease of native coronary artery without angina pectoris: Secondary | ICD-10-CM | POA: Diagnosis not present

## 2022-10-03 DIAGNOSIS — I25118 Atherosclerotic heart disease of native coronary artery with other forms of angina pectoris: Secondary | ICD-10-CM | POA: Diagnosis not present

## 2022-10-03 DIAGNOSIS — I252 Old myocardial infarction: Secondary | ICD-10-CM | POA: Diagnosis not present

## 2022-10-03 DIAGNOSIS — R002 Palpitations: Secondary | ICD-10-CM | POA: Insufficient documentation

## 2022-10-03 LAB — BASIC METABOLIC PANEL
Anion gap: 12 (ref 5–15)
BUN: 11 mg/dL (ref 6–20)
CO2: 21 mmol/L — ABNORMAL LOW (ref 22–32)
Calcium: 8.9 mg/dL (ref 8.9–10.3)
Chloride: 103 mmol/L (ref 98–111)
Creatinine, Ser: 0.71 mg/dL (ref 0.44–1.00)
GFR, Estimated: >60 mL/min (ref >60)
Glucose, Bld: 306 mg/dL — ABNORMAL HIGH (ref 70–99)
Potassium: 3.3 mmol/L — ABNORMAL LOW (ref 3.5–5.1)
Sodium: 136 mmol/L (ref 135–145)

## 2022-10-03 LAB — BRAIN NATRIURETIC PEPTIDE: B Natriuretic Peptide: 62.2 pg/mL (ref 0.0–100.0)

## 2022-10-03 MED ORDER — ATORVASTATIN CALCIUM 80 MG PO TABS: 80.0000 mg | ORAL_TABLET | Freq: Every day | ORAL | 3 refills | Status: DC

## 2022-10-03 NOTE — Progress Notes (Signed)
Advanced Heart Failure Clinic Note    PCP: [name], DO Primary Cardiologist: Elouise Munroe, MD  HF Cardiologist: Dr. Haroldine Laws  HPI: Amanda Frederick is a 52 y.o. female with history of DM1, HTN, HLD, hx fibromyalgia, neuropathy, hx COVID-19 infection 02/2020. No cardiac history until recently.   Reports gradually progressing dyspnea with exertion for a couple of years. Felt horrible after COVID and dyspnea never really improved. Underwent ORIF for ankle fracture in June 2022. Initially did okay post-op. Subsequently developed LE edema, dyspnea and orthopnea. She was later admitted July 2022 with acute systolic CHF.   Echo 7/22: EF 25-30%, RV ok, large left pleural effusion, mild MR. Diuresed with IV lasix. Underwent thoracentesis for L pleural effusion.  R/LHC 7/22:  50% OM1, 50% OM2, 50% D2, 50% mid to distal LAD, 90% mid Lcx treated with PCI/DES RA 6 PA 34/15 (23) PCWP 9 mmHg Fick CO 5.02 Fick CI 2.54 PA sat 75%  Echo 11/22: EF 30-35%, septal and peri-apical severe hypokinesis, RV okay, mild MR  Seen for initial HF clinic visit 08/07/21. Noted ongoing dyspnea with exertion. Volume looked okay. ReDS 28%. Started on spiro.   Echo 09/16/21: EF 35-40%, RV okay, mild MR  cMRI 09/16/21: LVEF 43%, RVEF 49%, delayed enhancement showing myocardial infarction-pattern scarring consistent with substantial LAD territory MI.  Repeat cath 09/20/21 showed progression of LAD disease. Now with diffuse diabetic CAD in mid to distal LAD corresponding to infarct seen on cMRI. Reviewed with IC team. No target for intervention particularly given lack of viability on MRI. EF 35-45%   Ao = 133/79 (104) LV = 124/15 RA =  1 RV = 30/6 PA = 29/8 (17) PCW = 10 Fick cardiac output/index = 6.1/3.1 PVR = 0.9 WU Ao sat = 99% PA sat = 81%, 82% High SVC = 79%   Follow up 7/23, stable NYHA III and volume ok. Entresto increased to 49/51. Discussed enrolling in virtual CR.  Follow up 11/23, stable  NYHA III and volume ok. CPX arranged as symptoms out of proportion to exam findings.   At last visit, Entresto decreased due to low BP. That has helped. SBP now usually 120s. No low BPs. No edema, orthopnea or PND. Still with occasional CP but stable. Can do all ADLs without difficulty. Gets fatigued with a lot of walking but needs to go slow. Overall much improved. DM1 out of control recent A1c 8 -> 12. Trying to get closed loop system.     Review of Systems:  Cardiac and Respiratory - Negative except as mentioned in HPI.  Past Medical History:  Diagnosis Date   AC (acromioclavicular) joint bone spurs    Anemia    Anxiety    Arthritis    Bronchitis    Bulging lumbar disc    Bursitis    CHF (congestive heart failure) (HCC)    Complication of anesthesia    "has died 3 separate times under anesthesia"   Depression    Diabetes ([place])    Environmental allergies    Fibromyalgia    High blood pressure    High cholesterol    History of COVID-19 01/2020   was hospitalized   History of endometriosis    History of uterine fibroid    Migraines    Neuropathy    eyes , hands and feet    Pinched nerve    Pneumonia    Seizure ([place])    after eye test only   Spondylosis    Tachycardia  after COVID   Tendonitis    Vision abnormalities     Current Outpatient Medications  Medication Sig Dispense Refill   aspirin 81 MG chewable tablet Chew by mouth daily.     cholecalciferol (VITAMIN D) 1000 UNITS tablet Take 1,000 Units by mouth daily.     clopidogrel (PLAVIX) 75 MG tablet TAKE 1 TABLET BY MOUTH EVERY DAY 90 tablet 3   Continuous Blood Gluc Receiver ([place]) DEVI 1 each by Does not apply route as needed. 1 each 0   Continuous Blood Gluc Sensor (DEXCOM G7 SENSOR) MISC 3 each by Does not apply route as directed. 3 each 11   dicyclomine (BENTYL) 20 MG tablet Take 1 tablet (20 mg total) by mouth every 6 (six) hours. 60 tablet 3   furosemide (LASIX) 40 MG tablet Take 1 tablet  (40 mg total) by mouth daily as needed. 30 tablet 6   glucose blood (BAYER CONTOUR NEXT TEST) test strip Test 8-10 times per day 8-10 lancets/day 300 each 11   insulin aspart (NOVOLOG) 100 UNIT/ML injection take up to 150 units per day via the pump as instructed 10 mL 11   loperamide (IMODIUM) 2 MG capsule Take 1 capsule (2 mg total) by mouth as needed for diarrhea or loose stools. Take 2 mg after every loose bowel movement with a maximum of 16 mg a day (maximum of 8 pills a day) 90 capsule 2   Magnesium 250 MG TABS Take 250 mg by mouth daily.     metoprolol succinate (TOPROL-XL) 50 MG 24 hr tablet TAKE 1 TABLET BY MOUTH TWICE A DAY 180 tablet 1   Multiple Vitamin (MULTIVITAMIN) capsule Take 1 capsule by mouth daily.     potassium chloride SA (KLOR-CON M) 20 MEQ tablet Take 1 tablet (20 mEq total) by mouth daily. 90 tablet 3   sacubitril-valsartan (ENTRESTO) 49-51 MG Take 1 tablet by mouth 2 (two) times daily. 60 tablet 6   spironolactone (ALDACTONE) 25 MG tablet TAKE 1/2 TABLET BY MOUTH EVERY DAY 45 tablet 3   atorvastatin (LIPITOR) 80 MG tablet Take 1 tablet (80 mg total) by mouth daily. (Patient not taking: Reported on 07/31/2022) 90 tablet 3   Insulin Infusion Pump (T:SLIM X2 CONTROL-IQ PUMP) DEVI 1 each by Does not apply route as needed. (Patient not taking: Reported on 07/31/2022) 1 each 0   Insulin Infusion Pump Supplies (PARADIGM QUICK-SET 32" 6MM) MISC 1 Device by Does not apply route continuous. Vovolog (Patient not taking: Reported on 02/11/2022)     Insulin Infusion Pump Supplies (T:SLIM X2 3ML CARTRIDGE) MISC 1 each by Does not apply route as needed. (Patient not taking: Reported on 07/31/2022) 10 each 11   Insulin Infusion Pump Supplies (T:SLIM X2/CONTROL-IQ/ACC/INSTR) MISC 1 each by Does not apply route as needed. (Patient not taking: Reported on 07/31/2022) 1 each 0   isosorbide mononitrate (IMDUR) 30 MG 24 hr tablet Take 0.5 tablets (15 mg total) by mouth daily. (Patient not taking: Reported  on 07/31/2022) 15 tablet 7   No current facility-administered medications for this encounter.   Facility-Administered Medications Ordered in Other Encounters  Medication Dose Route Frequency Provider Last Rate Last Admin   sodium chloride flush (NS) 0.9 % injection 3 mL  3 mL Intravenous Q12H Eoghan Belcher, Shaune Pascal, MD       Allergies  Allergen Reactions   Fentanyl Nausea And Vomiting   Other Hives    Antibiotic specific one unsure but has tolerated antibiotic recently. Reaction was about  20 years ago.   Social History   Socioeconomic History   Marital status: Married    Spouse name: [name]   Number of children: 3   Years of education: college   Highest education level: Not on file  Occupational History    Comment: Home maker  Tobacco Use   Smoking status: Never   Smokeless tobacco: Never  Vaping Use   Vaping Use: Not on file  Substance and Sexual Activity   Alcohol use: Not Currently    Comment: special occassions   Drug use: No   Sexual activity: Not on file  Other Topics Concern   Not on file  Social History Narrative   Patient lives at home with her husband [name]. Worked previously in optometrist office, left work due to personnel issues. Patient has college education. Patient drinks 4-6 caffeine drinks daily.Right handed.   Social Determinants of Health   Financial Resource Strain: Low Risk  (07/29/2022)   Overall Financial Resource Strain (CARDIA)    Difficulty of Paying Living Expenses: Not hard at all  Food Insecurity: No Food Insecurity (07/29/2022)   Hunger Vital Sign    Worried About Running Out of Food in the Last Year: Never true    Ran Out of Food in the Last Year: Never true  Transportation Needs: No Transportation Needs (07/29/2022)   PRAPARE - Hydrologist (Medical): No    Lack of Transportation (Non-Medical): No  Physical Activity: Not on file  Stress: Stress Concern Present (07/29/2022)   [place]    Feeling of Stress : Very much  Social Connections: Not on file  Intimate Partner Violence: Not At Risk (07/29/2022)   Humiliation, Afraid, Rape, and Kick questionnaire    Fear of Current or Ex-Partner: No    Emotionally Abused: No    Physically Abused: No    Sexually Abused: No   Family History  Problem Relation Age of Onset   Anemia Mother    High Cholesterol Mother    Diabetes Mother    Arthritis Mother    Diabetes type I Maternal Grandmother    Colon cancer Neg Hx    Esophageal cancer Neg Hx    Stomach cancer Neg Hx    Colon polyps Neg Hx    BP (!) 140/84   Pulse 99   Wt 75.8 kg (167 lb)   SpO2 99%   BMI 26.95 kg/m   Wt Readings from Last 3 Encounters:  08/01/22 75.8 kg (167 lb)  07/31/22 75.8 kg (167 lb)  07/09/22 78.2 kg (172 lb 6.4 oz)   PHYSICAL EXAM: General:  Well appearing. No resp difficulty HEENT: normal Neck: supple. no JVD. Carotids 2+ bilat; no bruits. No lymphadenopathy or thryomegaly appreciated. Cor: PMI nondisplaced. Regular rate & rhythm. No rubs, gallops or murmurs. Lungs: clear Abdomen: soft, nontender, nondistended. No hepatosplenomegaly. No bruits or masses. Good bowel sounds. Extremities: no cyanosis, clubbing, rash, edema Neuro: alert & orientedx3, cranial nerves grossly intact. moves all 4 extremities w/o difficulty. Affect pleasant   ECG: NSR 89 No ST-T wave abnormalities. Personally reviewed   ASSESSMENT & PLAN:  Chronic systolic CHF/ICM: - Echo (7/22): EF 25-30%, RV okay - R/LHC (7/22): RA 6, PA mean 23, PCWP 9, Fick CI 2.54. 90% Lcx treated with PCI/DES, diffuse disease marginal branches, LAD and diagonal - Echo (11/22): EF 30-35% RV okay - Echo (2/23): EF 35-40%, RV okay, mild MR - cMRI (2/23): LVEF  43%, RVEF 49%, delayed enhancement suggestive of substantial LAD territory MI. - Cath 2/23 EF 35-40% showed progression of LAD disease. Now with diffuse diabetic CAD in mid to distal LAD  corresponding to infarct seen on cMRI. Reviewed with IC team. Lcx stent patent. No target for intervention particularly given lack of viability on MRI. RHC ok  - Improved NYHA II-III volume ok  - Continue Entresto 24/26 mg bid. (Decreased due to low BP) - Continue Lasix 40 mg prn.  - Continue Toprol XL 50 mg bid - Continue spiro 12.5 mg daily. - No SGLT2i with Type I DM - Check labs today - Due for repeat echo  2. CAD: - s/p PCI/DES mid Lcx 07/22.  - cMRI with delayed enhancement suggestive of LAD territory myocardial infarction. Dr. Haroldine Laws reviewed prior cath images from 07/22 personally. There is 70-80% diffuse disease in mid to distal LAD (? Event at some point with acute occlusion and subsequent recannulation) - Cath 2/23 as above. Medical therapy  - No s/s angina - On DAPT with aspirin 81 mg daily + plavix 75 mg daily.  - Continue atorvastatin 80. - Stable angina. No targets for PCI.  - Now off Imdur with labile BP  3. Palpitations - Zio 1/24 4 runs NSVT. 1 run SVT  4. Hyperlipidemia - On high-intensity statin - Managed by primary cardiology team  5. Type I DM: - Previously had insulin pump - A1c 8.4 -> 12 - Consider endo input  6. Snoring - In-lab sleep study ordered.  7. Chronic diarrhea - followed by GI  Glori Bickers, MD  10:13 AM

## 2022-10-03 NOTE — Telephone Encounter (Signed)
Patient aware of labs. She verbalized understanding about medication changes. Labs ordered and scheduled.

## 2022-10-03 NOTE — Patient Instructions (Signed)
There has been no changes to your medications.  Labs done today, your results will be available in MyChart, we will contact you for abnormal readings.  Your physician has requested that you have an echocardiogram. Echocardiography is a painless test that uses sound waves to create images of your heart. It provides your doctor with information about the size and shape of your heart and how well your heart's chambers and valves are working. This procedure takes approximately one hour. There are no restrictions for this procedure. Please do NOT wear cologne, perfume, aftershave, or lotions (deodorant is allowed). Please arrive 15 minutes prior to your appointment time.  Your physician recommends that you schedule a follow-up appointment in: 6 months ( September) ** please call the office in July to arrange your follow up appointment. **  If you have any questions or concerns before your next appointment please send [place] a message through [place] or call our office at [phone].    TO LEAVE A MESSAGE FOR THE NURSE SELECT OPTION 2, PLEASE LEAVE A MESSAGE INCLUDING: YOUR NAME DATE OF BIRTH CALL BACK NUMBER REASON FOR CALL**this is important as we prioritize the call backs  YOU WILL RECEIVE A CALL BACK THE SAME DAY AS LONG AS YOU CALL BEFORE 4:00 PM  At the [facility], you and your health needs are our priority. As part of our continuing mission to provide you with exceptional heart care, we have created designated Provider Care Teams. These Care Teams include your primary Cardiologist (physician) and Advanced Practice Providers (APPs- Physician Assistants and Nurse Practitioners) who all work together to provide you with the care you need, when you need it.   You may see any of the following providers on your designated Care Team at your next follow up: Dr Glori Bickers Dr Loralie Champagne Dr. Roxana Hires, NP Lyda Jester, [place] [facility] [place], [place] Forestine Na, NP Audry Riles, PharmD   Please be sure to bring in all your medications bottles to every appointment.    Thank you for choosing [facility]

## 2022-10-03 NOTE — Telephone Encounter (Signed)
error 

## 2022-10-07 ENCOUNTER — Telehealth: Payer: Self-pay | Admitting: *Deleted

## 2022-10-07 NOTE — Telephone Encounter (Signed)
Spoke with patient's husband who says that patient was diagnosed with the Flu on Sunday at an Urgent Care.  Patient is continuing to cough and does not feel well.   Has had a fever of 102. Cough thick green mucous.  Patient also has a history of COPD.  Husband requesting an appointment for patient today.  No available appointments today.  Does not want to return to Urgent Care.  Advised to take patient to the ER as soon as possible.

## 2022-10-08 ENCOUNTER — Ambulatory Visit (INDEPENDENT_AMBULATORY_CARE_PROVIDER_SITE_OTHER): Payer: 59 | Admitting: Student

## 2022-10-08 VITALS — BP 128/83 | HR 114 | Temp 97.0°F | Ht 66.0 in | Wt 178.0 lb

## 2022-10-08 DIAGNOSIS — J111 Influenza due to unidentified influenza virus with other respiratory manifestations: Secondary | ICD-10-CM

## 2022-10-08 DIAGNOSIS — E1059 Type 1 diabetes mellitus with other circulatory complications: Secondary | ICD-10-CM

## 2022-10-08 LAB — URINALYSIS, ROUTINE W REFLEX MICROSCOPIC
Bilirubin Urine: NEGATIVE
Glucose, UA: >=500 mg/dL — AB
Hgb urine dipstick: NEGATIVE
Ketones, ur: 5 mg/dL — AB
Nitrite: NEGATIVE
Protein, ur: 30 mg/dL — AB
Specific Gravity, Urine: 1.03 (ref 1.005–1.030)
WBC, UA: >50 WBC/hpf (ref 0–5)
pH: 6 (ref 5.0–8.0)

## 2022-10-08 LAB — BASIC METABOLIC PANEL
Anion gap: 14 (ref 5–15)
BUN: 11 mg/dL (ref 6–20)
CO2: 23 mmol/L (ref 22–32)
Calcium: 9.9 mg/dL (ref 8.9–10.3)
Chloride: 104 mmol/L (ref 98–111)
Creatinine, Ser: 0.72 mg/dL (ref 0.44–1.00)
GFR, Estimated: >60 mL/min (ref >60)
Glucose, Bld: 74 mg/dL (ref 70–99)
Potassium: 3.6 mmol/L (ref 3.5–5.1)
Sodium: 141 mmol/L (ref 135–145)

## 2022-10-08 LAB — BETA-HYDROXYBUTYRIC ACID: Beta-Hydroxybutyric Acid: 0.11 mmol/L (ref 0.05–0.27)

## 2022-10-08 NOTE — Patient Instructions (Signed)
Thank you, Ms.[name] for allowing [place] to provide your care today. Today we discussed .  Influenza infection Please continue to take your cough and cold medicine. Continue alternating ibuprofen and tylenol. If you have new symptoms that occur or your current symptom continue to worsen, please call our clinic  Diabetes   Please continue your lantus nightly and sliding scale. Please return to clinic in 1-2 weeks so we can make adjustments to your insulin. I have put in an urgent referral to endocrinology  I have ordered the following labs for you:   Lab Orders         BMP8+Anion Gap         Beta-hydroxybutyric acid         Urinalysis, Reflex Microscopic       Referrals ordered today:    Referral Orders         Ambulatory referral to Endocrinology      I have ordered the following medication/changed the following medications:   Stop the following medications: There are no discontinued medications.   Start the following medications: No orders of the defined types were placed in this encounter.    Follow up: 1-2 week or sooner    Should you have any questions or concerns please call the internal medicine clinic at [phone].    Sanjuana Letters, D.O. [place]

## 2022-10-08 NOTE — Progress Notes (Signed)
CC: Influenza Infection  HPI:  [name] is a 52 y.o. female living with a history stated below and presents today for URI secondary to influenza infection. Please see problem based assessment and plan for additional details.  Past Medical History:  Diagnosis Date   AC (acromioclavicular) joint bone spurs    Anemia    Anxiety    Arthritis    Bronchitis    Bulging lumbar disc    Bursitis    CHF (congestive heart failure) (HCC)    Complication of anesthesia    "has died 3 separate times under anesthesia"   Depression    Diabetes (HCC)    Environmental allergies    Fibromyalgia    High blood pressure    High cholesterol    History of COVID-19 01/2020   was hospitalized   History of endometriosis    History of uterine fibroid    Migraines    Neuropathy    eyes , hands and feet    Pinched nerve    Pneumonia    Seizure (HCC)    after eye test only   Spondylosis    Tachycardia    after COVID   Tendonitis    Vision abnormalities     Current Outpatient Medications on File Prior to Visit  Medication Sig Dispense Refill   aspirin 81 MG chewable tablet Chew by mouth daily.     atorvastatin (LIPITOR) 80 MG tablet Take 1 tablet (80 mg total) by mouth daily. 90 tablet 3   cholecalciferol (VITAMIN D) 1000 UNITS tablet Take 1,000 Units by mouth daily.     clopidogrel (PLAVIX) 75 MG tablet TAKE 1 TABLET BY MOUTH EVERY DAY 90 tablet 3   Continuous Blood Gluc Receiver (DEXCOM G7 RECEIVER) DEVI 1 each by Does not apply route as needed. (Patient not taking: Reported on 10/03/2022) 1 each 0   dicyclomine (BENTYL) 20 MG tablet TAKE 1 TABLET BY MOUTH EVERY 6 HOURS. 360 tablet 1   furosemide (LASIX) 40 MG tablet TAKE 1 TABLET BY MOUTH DAILY AS NEEDED 90 tablet 2   glucose blood (BAYER CONTOUR NEXT TEST) test strip Test 8-10 times per day 8-10 lancets/day 300 each 11   insulin aspart (NOVOLOG) 100 UNIT/ML injection Inject 10 units with meals three times a day. Inject 20 units for BS  > 400, 15 units for BS 300-399, 10 units for BS  200-299 and 5 units for BS 100-199 10 mL 11   insulin detemir (LEVEMIR FLEXPEN) 100 UNIT/ML FlexPen INJECT 40 UNITS INTO THE SKIN DAILY 15 mL 5   Insulin Infusion Pump (T:SLIM X2 CONTROL-IQ PUMP) DEVI 1 each by Does not apply route as needed. (Patient not taking: Reported on 07/31/2022) 1 each 0   Insulin Infusion Pump Supplies (PARADIGM QUICK-SET 32" ) MISC Use with novolog in insulin pump (Patient not taking: Reported on 10/03/2022) 60 each 4   Insulin Infusion Pump Supplies (T:SLIM X2 CARTRIDGE) MISC Use with insulin pump (Patient not taking: Reported on 10/03/2022) 60 each 4   Insulin Infusion Pump Supplies (T:SLIM X2/CONTROL-IQ/ACC/INSTR) MISC Use with insulin pump (Patient not taking: Reported on 10/03/2022) 1 each 1   loperamide (IMODIUM) 2 MG capsule Take 1 capsule (2 mg total) by mouth as needed for diarrhea or loose stools. Take 2 mg after every loose bowel movement with a maximum of 16 mg a day (maximum of 8 pills a day) 90 capsule 2   Magnesium 250 MG TABS Take 250 mg by mouth daily.  metoprolol succinate (TOPROL-XL) 50 MG 24 hr tablet TAKE 1 TABLET BY MOUTH TWICE A DAY 180 tablet 1   Multiple Vitamin (MULTIVITAMIN) capsule Take 1 capsule by mouth daily.     potassium chloride SA (KLOR-CON M) 20 MEQ tablet TAKE 1 TABLET BY MOUTH EVERY DAY 90 tablet 3   sacubitril-valsartan (ENTRESTO) 24-26 MG Take 1 tablet by mouth 2 (two) times daily. 60 tablet 8   spironolactone (ALDACTONE) 25 MG tablet TAKE 1/2 TABLET BY MOUTH EVERY DAY 45 tablet 3   [DISCONTINUED] DULoxetine (CYMBALTA) 60 MG capsule Take 1 capsule (60 mg total) by mouth daily. (Patient not taking: Reported on 01/03/2021) 30 capsule 12   Current Facility-Administered Medications on File Prior to Visit  Medication Dose Route Frequency Provider Last Rate Last Admin   sodium chloride flush (NS) 0.9 % injection 3 mL  3 mL Intravenous Q12H Bensimhon, Bevelyn Buckles, MD        Review of  Systems: ROS negative except for what is noted on the assessment and plan.  Vitals:   10/08/22 1600 10/08/22 1606 10/08/22 1609 10/08/22 1612  BP: 135/88 (!) 149/97 (!) 141/93 128/83  Pulse: (!) 103 92 (!) 102 (!) 114  Temp: (!) 97 F (36.1 C)     TempSrc: Oral     SpO2: 97%     Weight: 178 lb (80.7 kg)     Height: 5\' 6"  (1.676 m)       Physical Exam: Constitutional: unwell appearing, no acute distress HENT: normocephalic atraumatic, mucous membranes dry.  Eyes: conjunctiva non-erythematous Neck: supple Cardiovascular: regular rate and rhythm, no m/r/g Pulmonary/Chest: normal work of breathing on room air, lungs clear to auscultation bilaterally MSK: normal bulk and tone Neurological: alert & oriented x 3 Skin: warm and dry Psych: normal mood  Assessment & Plan:   Upper respiratory tract infection due to influenza Assessment: [name] was recently diagnosed with influenza (unclear A or B) after symptoms of URI and presentation to urgent care. She felt well until 6 days ago when she had began feeling ill with body aches. She slept most of that day and woke up the next morning feeling better. Later that evening she felt worse with body aches, chills, sore throat and a productive cough. She had fevers as high as 102F and 103F. She then presented to urgent care the following day and was diagnosed with influenza. While at home she has been taking cough cold medicines and alternating tylenol and ibuprofen. She denies any shortness of breath or chest pain.   She also has noticed increased dizziness, nausea and while she is able to tolerate PO's it is uncomfortable with her sore throat. She has had associated nausea but denies vomiting or multiple episodes of diarrhea.   One exam today she is unwell appearing but in no distress. She is breathing comfortably on room air with unremarkable O2 saturation. Her lungs are clear to auscultation bilaterally.   Low suspicion for progression to  viral pneumonia or post viral pneumonia. She is currently 6 days out from her initial symptoms and still not feeling ill. We can continue conservative management with OTC cough and cold medicines, netty pot, and tylenol/ibuprofen. She was given strict return precautions if worsening of symptoms, new symptoms, or changes in breathing.  Plan: - continue conservative management - follow up sooner if new symptoms/progression of current symptoms  DM (diabetes mellitus), type 1 (HCC) Assessment: Current regimen of novolog SSI w/ meals and levemir 40 U daily. She continues  to have uncontrolled glucose levels noting they range between 100-400 daily. Prior to this, she was well controlled with an A1c at around 6.0% on a T Slim insulin pump. She was previousliy followed by endocrinology. With her doing well on this specific pump, believe she should continue with endocrinology and I will place an urgent referral.   With her current illness and still not feeling well after 6 days will send BMP, BHB, and UA to assess for DKA.   Plan: - continue insulin regimen as per above - urgent referral to endocrinology - DKA labs  Addendum:  Urine with minimal ketones, anion gap elevated but bicarb normal. BHB negative. Continue to monitor glucose levels  Patient discussed with Dr. Jeanine Luz, D.O. [facility] Internal Medicine, PGY-3 Phone: [phone] Date 10/09/2022 Time 8:21 AM

## 2022-10-08 NOTE — Telephone Encounter (Signed)
Call placed to patient. No answer. Left message on VM requesting return call.   Call placed to patient's husband. States he took her to [facility] in [place]. She was tested for strep, covid, and flu. Only flu was positive. She was given a "shot" and Rx for cough medicine. They were told by Pharmacist that the cough med ordered by UC would interact with meds for T1DM, and HTN. Patient continues with "awful cough," and body aches. Fever is gone today. States he took her to ED yesterday but the waiting room was "packed" and she felt too ill to wait. Appt given today for 1545 with [facility] Team Provider. Husband will call back if he is unable to keep this appt. He understands that if fever returns, CBG's uncontrolled to take patient directly to ED. He agrees.

## 2022-10-09 ENCOUNTER — Other Ambulatory Visit: Payer: Self-pay | Admitting: Student

## 2022-10-09 ENCOUNTER — Telehealth: Payer: Self-pay | Admitting: Dietician

## 2022-10-09 DIAGNOSIS — J111 Influenza due to unidentified influenza virus with other respiratory manifestations: Secondary | ICD-10-CM | POA: Insufficient documentation

## 2022-10-09 NOTE — Telephone Encounter (Signed)
Called Amanda Frederick, she notes some improvement in her symptoms from her influenza. She notes the insulin she has been using is samples from our clinic. Believe she would do better with tresiba while we work on getting her back on an insulin pump. Will have her follow up in 1-2 weeks after her acute illness. Right now she is having morning glucose readings in the 400's. Asked her to increase her levemir from 40 to 45 units. She has a follow up appointment next week with Dr. Coy Saunas and he can adjust regimen further at this time.

## 2022-10-09 NOTE — Assessment & Plan Note (Signed)
Assessment: Amanda Frederick was recently diagnosed with influenza (unclear A or B) after symptoms of URI and presentation to urgent care. She felt well until 6 days ago when she had began feeling ill with body aches. She slept most of that day and woke up the next morning feeling better. Later that evening she felt worse with body aches, chills, sore throat and a productive cough. She had fevers as high as 102F and 103F. She then presented to urgent care the following day and was diagnosed with influenza. While at home she has been taking cough cold medicines and alternating tylenol and ibuprofen. She denies any shortness of breath or chest pain.   She also has noticed increased dizziness, nausea and while she is able to tolerate PO's it is uncomfortable with her sore throat. She has had associated nausea but denies vomiting or multiple episodes of diarrhea.   One exam today she is unwell appearing but in no distress. She is breathing comfortably on room air with unremarkable O2 saturation. Her lungs are clear to auscultation bilaterally.   Low suspicion for progression to viral pneumonia or post viral pneumonia. She is currently 6 days out from her initial symptoms and still not feeling ill. We can continue conservative management with OTC cough and cold medicines, netty pot, and tylenol/ibuprofen. She was given strict return precautions if worsening of symptoms, new symptoms, or changes in breathing.  Plan: - continue conservative management - follow up sooner if new symptoms/progression of current symptoms

## 2022-10-09 NOTE — Telephone Encounter (Signed)
Called Amanda Frederick to see what our office needs to do to assist her with getting all the paperwork done for an insulin pump. She is difficult to understand because she is hoarse, but what I understood was that she needs a referral. Dr. Johnney Ou is working on an endocrine referral.  She also said her blood sugars are high, she is taking extra insulin per her guidelines and drinking plenty of fluids. We agreed to a call on Monday to follow up. she is waiting to hear from Dr. Johnney Ou about her labs done yesterday.

## 2022-10-09 NOTE — Assessment & Plan Note (Addendum)
Assessment: Current regimen of novolog SSI w/ meals and levemir 40 U daily. She continues to have uncontrolled glucose levels noting they range between 100-400 daily. Prior to this, she was well controlled with an A1c at around 6.0% on a T Slim insulin pump. She was previousliy followed by endocrinology. With her doing well on this specific pump, believe she should continue with endocrinology and I will place an urgent referral.   With her current illness and still not feeling well after 6 days will send BMP, BHB, and UA to assess for DKA.   Plan: - continue insulin regimen as per above - urgent referral to endocrinology - DKA labs - follow up after resolution of URI to adjust insulin regimen.   Addendum:  Urine with minimal ketones, anion gap elevated but bicarb normal. BHB negative. Continue to monitor glucose levels

## 2022-10-10 ENCOUNTER — Other Ambulatory Visit (HOSPITAL_COMMUNITY): Payer: Self-pay

## 2022-10-10 ENCOUNTER — Telehealth: Payer: Self-pay

## 2022-10-10 NOTE — Progress Notes (Signed)
Internal Medicine Clinic Attending  Case discussed with Dr. Katsadouros  At the time of the visit.  We reviewed the resident's history and exam and pertinent patient test results.  I agree with the assessment, diagnosis, and plan of care documented in the resident's note.  

## 2022-10-10 NOTE — Telephone Encounter (Signed)
Pharmacy Patient Advocate Encounter  Insurance verification completed.    The patient is insured through [employer]    ran test claims for the following: TRESIBA .  Copays and coinsurance results: $20.00   PLEASE BE ADVISED [facility] Patient Advocate [phone]

## 2022-10-13 ENCOUNTER — Ambulatory Visit: Payer: Self-pay | Admitting: *Deleted

## 2022-10-13 NOTE — Patient Instructions (Signed)
Visit Information  Thank you for taking time to visit with me today. Please don't hesitate to contact me if I can be of assistance to you.   Following are the goals we discussed today:   Goals Addressed             This Visit's Progress    Depression/Adjustment to life changes        Activities and task to complete in order to accomplish goals.    Hope you feel better soon- I haveconfirmed you do have appointment tomorrow with PCP  Call your insurance provider for more information about your Enhanced Benefits (glad you have been able to change your insurance to one that covers RX,etc) CSW inquiring with DSS about the letter you received  Continue to seek job opportunities as you wish Keep doing your seed/gardening as you prepare for seasonal enjoyment! Signs/Symptoms of depression remain prevalent- plan to discuss with PCP          Our next appointment is by telephone on 10/22/22   Please call the care guide team at [phone] if you need to cancel or reschedule your appointment.   If you are experiencing a Mental Health or [place] or need someone to talk to, please call the Suicide and Crisis Lifeline: 988 call 911   The patient verbalized understanding of instructions, educational materials, and care plan provided today and DECLINED offer to receive copy of patient instructions, educational materials, and care plan.   Telephone follow up appointment with care management team member scheduled for: 10/22/22 Eduard Clos, MSW, [place] Worker [employer] [employer] [facility] [phone]

## 2022-10-13 NOTE — Patient Outreach (Signed)
  Care Coordination   Follow Up Visit Note   10/13/2022 Name: Amanda Frederick MRN: [id number] DOB: Nov 18, 1970  Amanda Frederick is a 52 y.o. year old female who sees Amanda Blamer, DO for primary care. I spoke with  Amanda Frederick by phone today.  What matters to the patients health and wellness today?  Still sick form the flu- cough that is lingering. PCP visit tomorrow.     Goals Addressed             This Visit's Progress    Depression/Adjustment to life changes        Activities and task to complete in order to accomplish goals.    Hope you feel better soon- I haveconfirmed you do have appointment tomorrow with PCP  Call your insurance provider for more information about your Enhanced Benefits (glad you have been able to change your insurance to one that covers RX,etc) CSW inquiring with DSS about the letter you received  Continue to seek job opportunities as you wish Keep doing your seed/gardening as you prepare for seasonal enjoyment! Signs/Symptoms of depression remain prevalent- plan to discuss with PCP          SDOH assessments and interventions completed:  Yes     Care Coordination Interventions:  Yes, provided   Follow up plan: Follow up call scheduled for 10/22/22    Encounter Outcome:  Pt. Visit Completed

## 2022-10-14 ENCOUNTER — Encounter: Payer: Self-pay | Admitting: Student

## 2022-10-14 ENCOUNTER — Ambulatory Visit (INDEPENDENT_AMBULATORY_CARE_PROVIDER_SITE_OTHER): Payer: 59 | Admitting: Student

## 2022-10-14 VITALS — BP 124/89 | HR 79 | Temp 97.8°F | Ht 66.0 in | Wt 176.2 lb

## 2022-10-14 DIAGNOSIS — E1059 Type 1 diabetes mellitus with other circulatory complications: Secondary | ICD-10-CM

## 2022-10-14 DIAGNOSIS — J111 Influenza due to unidentified influenza virus with other respiratory manifestations: Secondary | ICD-10-CM | POA: Diagnosis not present

## 2022-10-14 DIAGNOSIS — Z0000 Encounter for general adult medical examination without abnormal findings: Secondary | ICD-10-CM | POA: Insufficient documentation

## 2022-10-14 DIAGNOSIS — F320 Major depressive disorder, single episode, mild: Secondary | ICD-10-CM

## 2022-10-14 DIAGNOSIS — Z794 Long term (current) use of insulin: Secondary | ICD-10-CM

## 2022-10-14 DIAGNOSIS — Z1231 Encounter for screening mammogram for malignant neoplasm of breast: Secondary | ICD-10-CM

## 2022-10-14 MED ORDER — BENZONATATE 100 MG PO CAPS: 100.0000 mg | ORAL_CAPSULE | Freq: Four times a day (QID) | ORAL | 1 refills | Status: DC | PRN

## 2022-10-14 MED ORDER — DEXCOM G7 SENSOR MISC: 3 refills | Status: DC

## 2022-10-14 MED ORDER — FLUTICASONE PROPIONATE 50 MCG/ACT NA SUSP: 1.0000 | Freq: Every day | NASAL | 2 refills | Status: DC

## 2022-10-14 MED ORDER — TRESIBA FLEXTOUCH 100 UNIT/ML ~~LOC~~ SOPN: 40.0000 [IU] | PEN_INJECTOR | Freq: Every day | SUBCUTANEOUS | 3 refills | Status: DC

## 2022-10-14 NOTE — Assessment & Plan Note (Addendum)
Discussed with patient concerning her depressed mood. Patient has not significantly improve over the last few months. PHQ-9 today remains at 14. Her chronic medical problems as well as inability to get her insulin pump has contributed to her depressed mood. She finds it difficult helping around the house, usually has low energy and trouble concentrating. Discussed with patient her options in managing her depression which include SSRI and cognitive behavioral therapy (CBT). Patient interested in trying CBT before initiating medication for her depression.  Plan: -Referral to integrated behavioral health

## 2022-10-14 NOTE — Progress Notes (Signed)
CC: URI follow-up, diabetes  HPI:  Ms.Kateryn Farrah is a 52 y.o. female with PMH as below who presents to clinic today to follow-up on her upper respiratory tract infection. Please see problem based charting for evaluation, assessment and plan.  Past Medical History:  Diagnosis Date   AC (acromioclavicular) joint bone spurs    Anemia    Anxiety    Arthritis    Bronchitis    Bulging lumbar disc    Bursitis    CHF (congestive heart failure) (HCC)    Complication of anesthesia    "has died 3 separate times under anesthesia"   Depression    Diabetes ([place])    Environmental allergies    Fibromyalgia    High blood pressure    High cholesterol    History of COVID-19 01/2020   was hospitalized   History of endometriosis    History of uterine fibroid    Migraines    Neuropathy    eyes , hands and feet    Pinched nerve    Pneumonia    Seizure (HCC)    after eye test only   Spondylosis    Tachycardia    after COVID   Tendonitis    Vision abnormalities     Review of Systems:  Constitutional: Positive for chills and fatigue. Negative for fever  HEENT: Positive for nasal congestion, nasal discharge and cough.  Negative for sore throat.  Respiratory: Negative for shortness of breath Cardiac: Negative for chest pain MSK: Negative for muscle aches Abdomen: Negative for abdominal pain, N/V, constipation or diarrhea Neuro: Positive for headache.  Physical Exam: General: Pleasant, well-appearing middle-aged woman. No acute distress. HEENT: Mild erythema of the nasal mucosa.  Mild postnasal drip, no oropharyngeal exudate or erythema. No sinus tenderness. Cardiac: RRR. No murmurs, rubs or gallops. No LE edema Respiratory: Lungs CTAB. Normal work of breathing. No wheezing or crackles. Abdominal: Soft, symmetric and non tender. Normal BS. Skin: Warm, dry and intact without rashes or lesions Psych: Appropriate mood and affect.  Vitals:   10/14/22 0917  BP: 124/89  Pulse: 79   Temp: 97.8 F (36.6 C)  TempSrc: Oral  SpO2: 99%  Weight: 176 lb 3.2 oz (79.9 kg)  Height: 5\' 6"  (1.676 m)    Assessment & Plan:   Upper respiratory tract infection due to influenza Patient here for follow-up after being evaluated 5 days ago for URI symptoms secondary to flu infection.  Patient reports resolution of her muscle aches and improvement in her fevers but she continues to have productive cough, nasal discharge with occasional chills.  She continues to feel fatigue and has intermittent headaches but denies any shortness of breath, chest pain, palpitations or vision changes. Has been managing his symptoms with Robitussin DM and alternating Tylenol and ibuprofen for her headaches.  She has a saline spray on her husband about her Flonase.  Patient is well-appearing on exam with occasional cough throughout encounter.  Lungs are clear to auscultation bilaterally with no increased work of breathing.  Vitals are stable with SpO2 in the 99%.  She has mild erythema of the nasal mucosa and mild postnasal drip but no significant nasal tenderness or postnasal drip.  Low concern for sinusitis, bronchitis or postviral pneumonia at this point.  Advised patient to get Nettie pot nasal rinse over-the-counter in addition to her current OTC meds.  Discussed plan to send Tessalon Perles to help with cough the prescription for Flonase. Discussed with patient to call clinic if  symptoms worsen or she started having shortness of breath.  Plan: -Start Nettie pot nasal rinse -Start Tessalon Perle 100 mg every 6 hours as needed for cough -Start Flonase 1 spray into each nostrils daily, saline spray at bedtime -Continue OTC Robitussin, Tylenol and ibuprofen -Follow-up as needed   DM (diabetes mellitus), type 1 ([place]) Patient has increased her Levemir to 45 units daily.  Blood sugar remains variable with some readings in the mid 100s and others in the 300s to 400s. No evidence of DKA on labs during last office  visit.  Patient's new insurance covers [place] discussed with patient plan to switch from Levemir to [place] which has a longer half-life. Butch Penny provided patient with Tyler Aas coupon to reduce the cost as well as CGM placement during the office visit today for close monitoring of her blood sugars.  Plan: -Start Tresiba 40 units daily -Discontinue Levemir -Continue NovoLog sliding scale -Follow-up in 2 weeks for CGM reading and adjustment of insulin regimen -Provided number for patient to call endocrinologist office -Will need MA/CR at next office visit  Depression Discussed with patient concerning her depressed mood. Patient has not significantly improve over the last few months. PHQ-9 today remains at 14. Her chronic medical problems as well as inability to get her insulin pump has contributed to her depressed mood. She finds it difficult helping around the house, usually has low energy and trouble concentrating. Discussed with patient her options in managing her depression which include SSRI and cognitive behavioral therapy (CBT). Patient interested in trying CBT before initiating medication for her depression.  Plan: -Referral to integrated behavioral health    See Encounters Tab for problem based charting.  Patient discussed with Dr.  Thomes Cake, MD, MPH

## 2022-10-14 NOTE — Patient Instructions (Addendum)
Thank you, Ms.[name] for allowing [place] to provide your care today. Today we discussed your flu infection, depression and type 1 diabetes.  For your viral infection, continue conservative management with Nettie pot sinus rinse, Tylenol/ibuprofen and Robitussin DM.  I am sending you a prescription for Flonase and Tessalon Perles.  Follow-up with [place] in 1 to 2 weeks if your symptoms worsen or you develop shortness of breath.  For your depression, I am referring you to our integrated cognitive behavioral therapy.   For your diabetes, I am switching you from Levemir to [place], which last longer. We also placed a CGM on you for close monitoring.   Call [facility] for an appointment [facility] Address: [address], [place], [place] [address] Phone: [phone]   Fax [phone]  I have ordered the following tests:   I have ordered the following medication/changed the following medications:  Discontinue Levemir and start Tresiba at 40 units daily Start Tessalon Perle 100 mg x 6 hours as needed for cough Start Flonase, 1 spray into each nostrils daily, continue saline spray at bedtime  My Chart Access: [email or web address]  Please follow-up in 2 weeks  Please make sure to arrive 15 minutes prior to your next appointment. If you arrive late, you may be asked to reschedule.    We look forward to seeing you next time. Please call our clinic at [phone] if you have any questions or concerns. The best time to call is Monday-Friday from 9am-4pm, but there is someone available 24/7. If after hours or the weekend, call the main hospital number and ask for the Internal Medicine Resident On-Call. If you need medication refills, please notify your pharmacy one week in advance and they will send [place] a request.   Thank you for letting us take part in your care. Wishing you the best!  Lacinda Axon, MD 10/14/2022, 10:04 AM IM Resident, PGY-3 Oswaldo Milian 41:10

## 2022-10-14 NOTE — Assessment & Plan Note (Signed)
Patient here for follow-up after being evaluated 5 days ago for URI symptoms secondary to flu infection.  Patient reports resolution of her muscle aches and improvement in her fevers but she continues to have productive cough, nasal discharge with occasional chills.  She continues to feel fatigue and has intermittent headaches but denies any shortness of breath, chest pain, palpitations or vision changes. Has been managing his symptoms with Robitussin DM and alternating Tylenol and ibuprofen for her headaches.  She has a saline spray on her husband about her Flonase.  Patient is well-appearing on exam with occasional cough throughout encounter.  Lungs are clear to auscultation bilaterally with no increased work of breathing.  Vitals are stable with SpO2 in the 99%.  She has mild erythema of the nasal mucosa and mild postnasal drip but no significant nasal tenderness or postnasal drip.  Low concern for sinusitis, bronchitis or postviral pneumonia at this point.  Advised patient to get Nettie pot nasal rinse over-the-counter in addition to her current OTC meds.  Discussed plan to send Tessalon Perles to help with cough the prescription for Flonase. Discussed with patient to call clinic if symptoms worsen or she started having shortness of breath.  Plan: -Start Nettie pot nasal rinse -Start Tessalon Perle 100 mg every 6 hours as needed for cough -Start Flonase 1 spray into each nostrils daily, saline spray at bedtime -Continue OTC Robitussin, Tylenol and ibuprofen -Follow-up as needed

## 2022-10-14 NOTE — Assessment & Plan Note (Signed)
Order screening mammogram

## 2022-10-14 NOTE — Assessment & Plan Note (Addendum)
Patient has increased her Levemir to 45 units daily.  Blood sugar remains variable with some readings in the mid 100s and others in the 300s to 400s. No evidence of DKA on labs during last office visit.  Patient's new insurance covers [place] discussed with patient plan to switch from Levemir to [place] which has a longer half-life. Butch Penny provided patient with Tyler Aas coupon to reduce the cost as well as CGM placement during the office visit today. While this will help with close monitoring of her blood sugar, I recommend patient use an insulin pump as she has done in the past and achieved better control of her diabetes with this therapy. She has completed a diabetes education program.   Plan: -Start Tresiba 40 units daily -Discontinue Levemir -Continue NovoLog sliding scale -Follow-up in 2 weeks for CGM reading and adjustment of insulin regimen -Provided number for patient to call endocrinologist office -Will need MA/CR at next office visit

## 2022-10-15 NOTE — Progress Notes (Signed)
Internal Medicine Clinic Attending  Case discussed with Dr. A[place]ons[place]  At the time of the visit.  We reviewed the resident's history and exam and pertinent patient test results.  I agree with the assessment, diagnosis, and plan of care documented in the resident's note.  

## 2022-10-16 ENCOUNTER — Ambulatory Visit (HOSPITAL_COMMUNITY)
Admission: RE | Admit: 2022-10-16 | Discharge: 2022-10-16 | Disposition: A | Discharge status: Home / Self Care | Payer: 59 | Source: Ambulatory Visit | Attending: Cardiology | Admitting: Cardiology

## 2022-10-16 DIAGNOSIS — I5022 Chronic systolic (congestive) heart failure: Secondary | ICD-10-CM | POA: Insufficient documentation

## 2022-10-16 LAB — BASIC METABOLIC PANEL
Anion gap: 7 (ref 5–15)
BUN: 13 mg/dL (ref 6–20)
CO2: 26 mmol/L (ref 22–32)
Calcium: 9.1 mg/dL (ref 8.9–10.3)
Chloride: 104 mmol/L (ref 98–111)
Creatinine, Ser: 0.92 mg/dL (ref 0.44–1.00)
GFR, Estimated: >60 mL/min (ref >60)
Glucose, Bld: 320 mg/dL — ABNORMAL HIGH (ref 70–99)
Potassium: 4.5 mmol/L (ref 3.5–5.1)
Sodium: 137 mmol/L (ref 135–145)

## 2022-10-20 ENCOUNTER — Telehealth: Payer: Self-pay | Admitting: Dietician

## 2022-10-20 NOTE — Telephone Encounter (Signed)
Received fax from [facility] requesting cahrt notes and recent A1c. These items were faxed back today with status of "OK".

## 2022-10-21 NOTE — Telephone Encounter (Signed)
Patient notified

## 2022-10-22 ENCOUNTER — Ambulatory Visit: Payer: Self-pay | Admitting: *Deleted

## 2022-10-22 ENCOUNTER — Telehealth: Payer: Self-pay | Admitting: Dietician

## 2022-10-22 DIAGNOSIS — E1059 Type 1 diabetes mellitus with other circulatory complications: Secondary | ICD-10-CM

## 2022-10-22 NOTE — Patient Instructions (Signed)
Visit Information  Thank you for taking time to visit with me today. Please don't hesitate to contact me if I can be of assistance to you.   Following are the goals we discussed today:   Goals Addressed             This Visit's Progress    Depression/Adjustment to life changes        Activities and task to complete in order to accomplish goals.    Continues to have symptoms from cold/flu - somewhat better-  Continue with RX as prescribed for cough, etc Continue to work on pump approval-reach out for help through  CSW to email you info on [employer] your insurance provider for more information about your Enhanced Benefits (mental health outpatient copay?glad you have been able to change your insurance to one that covers RX,etc) Expect call from [facility] provider  Keep doing your seed/gardening as you prepare for seasonal enjoyment! Signs/Symptoms of depression remain prevalent- per plans with PCP to be referred for CBT with [facility] provider          Our next appointment is by telephone on 11/07/22  Please call the care guide team at [phone] if you need to cancel or reschedule your appointment.   If you are experiencing a Mental Health or [place] or need someone to talk to, please call the Suicide and Crisis Lifeline: 988 call 911   The patient verbalized understanding of instructions, educational materials, and care plan provided today and DECLINED offer to receive copy of patient instructions, educational materials, and care plan.   Telephone follow up appointment with care management team member scheduled for: 11/07/22  Eduard Clos, MSW, [place] Worker [employer] [employer] [facility] [phone]

## 2022-10-22 NOTE — Patient Outreach (Signed)
  Care Coordination   Follow Up Visit Note   10/22/2022 Name: Amanda Frederick MRN: [id number] DOB: 09/26/1970  Amanda Frederick is a 52 y.o. year old female who sees [name], DO for primary care. I spoke with  [name] by phone today.  What matters to the patients health and wellness today?  Still with symptoms from ?flu    Goals Addressed             This Visit's Progress    Depression/Adjustment to life changes        Activities and task to complete in order to accomplish goals.    Continues to have symptoms from cold/flu - somewhat better-  Continue with RX as prescribed for cough, etc Continue to work on pump approval-reach out for help through  CSW to email you info on [employer] your insurance provider for more information about your Enhanced Benefits (mental health outpatient copay?glad you have been able to change your insurance to one that covers RX,etc) Expect call from [facility] provider  Keep doing your seed/gardening as you prepare for seasonal enjoyment! Signs/Symptoms of depression remain prevalent- per plans with PCP to be referred for CBT with [facility] provider          SDOH assessments and interventions completed:  Yes     Care Coordination Interventions:  Yes, provided  Interventions Today    Flowsheet Row Most Recent Value  Chronic Disease   Chronic disease during today's visit Diabetes  General Interventions   General Interventions Discussed/Reviewed [employer], Doctor Visits, Museum/gallery conservator (DME)  Amanda Frederick is still struggling to get approval for DM pump- suggested she see if DM Educator can hellp her with the info insurance is requesting.]  Doctor Visits Discussed/Reviewed Doctor Visits Discussed  [Saw PCP- started on cough med- pt reports not working well yet]  Museum/gallery conservator (DME) Insulin Pump  [Needs help from PCP office to get approved- plans to reach out to "Donna".]  [place] Discussed, Coping Strategies, Depression  [Per pt visit to PCP, plan is to refer to [facility] for CBT]       Follow up plan: Follow up call scheduled for 11/07/22    Encounter Outcome:  Pt. Visit Completed

## 2022-10-22 NOTE — Telephone Encounter (Signed)
Amanda Frederick calls for help with getting her insulin pump approved; she states her Insurance company needs codes, dx code, procedure code, date of service.  [employer] says they need from her insurance company:  Single case agreement-if she has out of network benefits  Found out that [employer] is non-participating.with [employer]. Amanda Frederick does not have out of network benefits Submitted clinical notes for a  single case agreement to precert team.  Reference # for call: [id number] [phone] fax in clinical information must have member's [employer] ID and reference number, turn around 14 days Patient notified.

## 2022-10-28 MED ORDER — T:SLIM X2 CONTROL-IQ PUMP DEVI
1.0000 | 0 refills | Status: AC | PRN
Start: 2022-10-28 — End: ?

## 2022-10-28 MED ORDER — T:SLIM X2/CONTROL-IQ/ACC/INSTR MISC
1 refills | Status: AC
Start: 2022-10-28 — End: ?

## 2022-10-28 MED ORDER — T:SLIM X2 3ML CARTRIDGE MISC
4 refills | Status: AC
Start: 2022-10-28 — End: ?

## 2022-10-28 MED ORDER — PARADIGM QUICK-SET 32" 6MM MISC
3 refills | Status: AC
Start: 2022-10-28 — End: ?

## 2022-10-28 NOTE — Telephone Encounter (Signed)
Amanda Frederick asks that we send her prescriptions for her insulin pump to [facility]. I reviewed the orders with her and she agrees they are correct.

## 2022-10-28 NOTE — Addendum Note (Signed)
Addended by: Riesa Pope on: 10/28/2022 04:01 PM   Modules accepted: Orders

## 2022-10-28 NOTE — Addendum Note (Signed)
Addended by: Resa Miner on: 10/28/2022 03:57 PM   Modules accepted: Orders

## 2022-10-28 NOTE — Telephone Encounter (Signed)
Amanda Frederick calls stating that she has used [facility] in the past and does not want to use them if possible. She wants to find out if there are other in -network DME companies before we send [place] a prescription.

## 2022-10-28 NOTE — Telephone Encounter (Signed)
I received a message from patient's insurance company that her pre-certification request for In- Network DME with [facility] insulin pump and supplies was NOT approved because they found an in-network supplier for her [employer] insulin pump and supplies. In-network diabetes DME  is [employer] supplies: [address], [place], [place] Authorization # is [id number] If a peer to peer request is desired call [phone] option #4.   Suggest we send order to [facility].  Call to patient, left message to discuss.

## 2022-10-28 NOTE — Addendum Note (Signed)
Addended by: Resa Miner on: 10/28/2022 08:57 AM   Modules accepted: Orders

## 2022-11-03 ENCOUNTER — Ambulatory Visit (HOSPITAL_COMMUNITY)
Admission: RE | Admit: 2022-11-03 | Discharge: 2022-11-03 | Disposition: A | Discharge status: Home / Self Care | Payer: 59 | Source: Ambulatory Visit | Attending: Family Medicine | Admitting: Family Medicine

## 2022-11-03 DIAGNOSIS — I2510 Atherosclerotic heart disease of native coronary artery without angina pectoris: Secondary | ICD-10-CM | POA: Insufficient documentation

## 2022-11-03 DIAGNOSIS — E119 Type 2 diabetes mellitus without complications: Secondary | ICD-10-CM | POA: Diagnosis not present

## 2022-11-03 DIAGNOSIS — I340 Nonrheumatic mitral (valve) insufficiency: Secondary | ICD-10-CM | POA: Diagnosis not present

## 2022-11-03 DIAGNOSIS — I252 Old myocardial infarction: Secondary | ICD-10-CM | POA: Diagnosis not present

## 2022-11-03 DIAGNOSIS — I5022 Chronic systolic (congestive) heart failure: Secondary | ICD-10-CM | POA: Insufficient documentation

## 2022-11-03 DIAGNOSIS — I110 Hypertensive heart disease with heart failure: Secondary | ICD-10-CM | POA: Insufficient documentation

## 2022-11-03 DIAGNOSIS — E785 Hyperlipidemia, unspecified: Secondary | ICD-10-CM | POA: Diagnosis not present

## 2022-11-03 LAB — ECHOCARDIOGRAM COMPLETE
AR max vel: 2.28 cm2
AV Area VTI: 2.55 cm2
AV Area mean vel: 2.42 cm2
AV Mean grad: 2 mmHg
AV Peak grad: 4.2 mmHg
Ao pk vel: 1.02 m/s
Area-P 1/2: 4.26 cm2
Calc EF: 42.3 %
MV VTI: 1.79 cm2
S' Lateral: 3.7 cm
Single Plane A2C EF: 48.4 %
Single Plane A4C EF: 37.9 %

## 2022-11-03 NOTE — Progress Notes (Signed)
*  PRELIMINARY RESULTS* Echocardiogram 2D Echocardiogram has been performed.  Carolyne Fiscal 11/03/2022, 9:05 AM

## 2022-11-06 ENCOUNTER — Ambulatory Visit: Payer: Self-pay | Admitting: *Deleted

## 2022-11-06 NOTE — Patient Outreach (Signed)
  Care Coordination   Follow Up Visit Note   11/06/2022 Name: Amanda Frederick MRN: [id number] DOB: 1971-06-18  Amanda Frederick is a 52 y.o. year old female who sees Rocky Morel, DO for primary care. I spoke with  Lesli Albee by phone today.  What matters to the patients health and wellness today?  "Still not feeling well"    Goals Addressed             This Visit's Progress    Depression/Adjustment to life changes        Activities and task to complete in order to accomplish goals.    Discuss current symptoms with PCP next week at office visit Continue to work on pump approval-reach out for help through  Call your insurance provider for more information about your Enhanced Benefits (mental health outpatient copay?glad you have been able to change your insurance to one that covers RX,etc) Attend initial visit 11/10/22 with Christen Butter, Integrated Behavioral Health provider  Keep enjoying your gardening  main with plans for with PCP to be referred for CBT with [facility] provider          SDOH assessments and interventions completed:  Yes     Care Coordination Interventions:  Yes, provided  Interventions Today    Flowsheet Row Most Recent Value  Chronic Disease   Chronic disease during today's visit Diabetes  [still waiting for insulin pump- has been ordered thru an in network provider]  General Interventions   General Interventions Discussed/Reviewed General Interventions Discussed, Community Resources  Doctor Visits Discussed/Reviewed Doctor Visits Discussed  [Pt to see PCP again next week- reports to me that "blowing my nose makes stuff come out of my eyes". She feels "tired" and body aches/pains- denies any significant depression,  "just tired"]  Durable Medical Equipment (DME) Insulin Pump  [Ordered from "[facility]" an in-network provider]  Mental Health Interventions   Mental Health Discussed/Reviewed Mental Health Discussed, Coping Strategies, Depression  [denies issues  today]       Follow up plan: Follow up call scheduled for 11/26/22    Encounter Outcome:  Pt. Visit Completed

## 2022-11-06 NOTE — Patient Instructions (Signed)
Visit Information  Thank you for taking time to visit with me today. Please don't hesitate to contact me if I can be of assistance to you.   Following are the goals we discussed today:   Goals Addressed             This Visit's Progress    Depression/Adjustment to life changes        Activities and task to complete in order to accomplish goals.    Discuss current symptoms with PCP next week at office visit Continue to work on pump approval-reach out for help through  Call your insurance provider for more information about your Enhanced Benefits (mental health outpatient copay?glad you have been able to change your insurance to one that covers RX,etc) Attend initial visit 11/10/22 with Christen Butter, Integrated Behavioral Health provider  Keep enjoying your gardening  main with plans for with PCP to be referred for CBT with [facility] provider          Our next appointment is by telephone on 11/26/22    Please call the care guide team at [phone] if you need to cancel or reschedule your appointment.   If you are experiencing a Mental Health or Behavioral Health Crisis or need someone to talk to, please call the Suicide and Crisis Lifeline: 988 call 911   The patient verbalized understanding of instructions, educational materials, and care plan provided today and DECLINED offer to receive copy of patient instructions, educational materials, and care plan.   Telephone follow up appointment with care management team member scheduled for: 11/26/22 Reece Levy, MSW, LCSW Clinical Social Worker [employer] [employer] [phone]

## 2022-11-10 ENCOUNTER — Ambulatory Visit (INDEPENDENT_AMBULATORY_CARE_PROVIDER_SITE_OTHER): Payer: 59 | Admitting: Licensed Clinical Social Worker

## 2022-11-10 DIAGNOSIS — F419 Anxiety disorder, unspecified: Secondary | ICD-10-CM

## 2022-11-10 NOTE — BH Specialist Note (Signed)
Integrated Behavioral Health Initial In-Person Visit  MRN: [id number] Name: Amanda Frederick  Number of Integrated Behavioral Health Clinician visits: No data recorded Session Start time: 1430    Session End time: 1530  Total time in minutes: 60   Types of Service: Individual psychotherapy, General Behavioral Integrated Care (BHI), and Health & Behavioral Assessment/Intervention  Interpretor:No. Interpretor Name and Language: N/A   Warm Hand Off Completed.        Subjective: Amanda Frederick is a 52 y.o. female accompanied by Spouse Patient was referred by Dickie La for Depression/ Anxiety. Patient reports the following symptoms/concerns: Stress and inability to sleep Duration of problem: years; Severity of problem: moderate  Objective: Mood: NA and Affect: Appropriate Risk of harm to self or others: No plan to harm self or others   Goals Addressed: Patient will: Reduce symptoms of: anxiety and stress  Progress towards Goals: Achieved  Interventions: Interventions utilized: Motivational Interviewing, Solution-Focused Strategies, Supportive Counseling, and Supportive Reflection  Standardized Assessments completed: PHQ-SADS    11/10/2022    2:45 PM 10/14/2022   10:35 AM 10/14/2022   10:34 AM  PHQ-SADS Last 3 Score only  Total GAD-7 Score 2 13   PHQ Adolescent Score 8  14      Assessment: Patient denied feeling depressed or having anxiety. [facility] repeated PHQSADS.  Patient advised she has a family of five living in her home which aids to her stress and inability to sleep. Family should have vacated by now , but are still present in the home. Family composition is three children and two adults. One of the children has a diagnosis of Autism. [facility], patient and husband discussed giving family a deadline and setting a structured environment ( Quiet time , bed time, dinner time and adjusting sleeping arrangements).   Patient stated she has gone five days without sleep and at times  struggles with sleeping. Patient denied wanting to take sleep aids. Patient admitted to not having a structure bed time. [facility] recommended patient create a calming environment and set a specific time to go to bed. [facility] recommends clean sheets and requesting guest to limit noise. Music and noises are distracting for patient. [facility] recommends ear plugs and quiet.   Patient stated her anxiety sometimes interferes with her breathing. [facility] recommends patient to focus on three things when she feels and anxiety attack approaching  ( example; Trees, sky and birds chirping) block everything else out and take ten deep breathes.     Patient stated she is doing well. Patient is being followed by [facility]. If needed, patient will call [facility] to schedule a follow up appointment with [facility].   Christen Butter, MSW, LCSW-A She/Her Behavioral Health Clinician [facility]  Internal Medicine Center Direct Dial:[phone]  Fax [phone] Main Office Phone: [phone] [address] [place] [place], [place] [address] Website: [facility] Internal Medicine [facility]  [place], [place]  [place]

## 2022-11-12 ENCOUNTER — Telehealth: Payer: Self-pay | Admitting: Dietician

## 2022-11-12 ENCOUNTER — Encounter: Payer: Self-pay | Admitting: Gastroenterology

## 2022-11-12 ENCOUNTER — Ambulatory Visit: Payer: 59 | Admitting: Gastroenterology

## 2022-11-12 VITALS — BP 122/78 | HR 85 | Ht 66.0 in | Wt 179.0 lb

## 2022-11-12 DIAGNOSIS — K580 Irritable bowel syndrome with diarrhea: Secondary | ICD-10-CM

## 2022-11-12 DIAGNOSIS — E10649 Type 1 diabetes mellitus with hypoglycemia without coma: Secondary | ICD-10-CM

## 2022-11-12 DIAGNOSIS — E1040 Type 1 diabetes mellitus with diabetic neuropathy, unspecified: Secondary | ICD-10-CM

## 2022-11-12 DIAGNOSIS — K529 Noninfective gastroenteritis and colitis, unspecified: Secondary | ICD-10-CM

## 2022-11-12 NOTE — Progress Notes (Signed)
HPI : Amanda Frederick is a very pleasant 52 year old female with CAD s/p stent and poorly controlled Type I DM who presents for follow up of chronic diarrhea and abdominal pain.  A work up for causes of her symptoms at her last visit was unrevealing, which included normal inflammatory markers (CRP, ESR, fecal lactoferrin), fecal elastase and negative TTG/IgA.  She was advised to start taking her loperamide on a scheduled basis, and titrate up her dose as needed to improve her stool frequency.  Bentyl was added to help with abdominal pain.  Today, she reports that her stool consistency is a little better.  She has some form to her stools sometimes; it's not just liquid every time.  She is still going 8-10 times per day most days and typically has a nocturnal bowel movement most nights.   Her pain is also better; whereas she used to have pain all day, now it is more intermittent.  She tried taking the bentyl but didn't think it helped.   She has noted that dairy causes more problems with diarrhea and pain. She does have issues with incontinence at times, typically stress incontinence and more rarely urge incontinence. Her weight has been stable and is followed closely because of her fluid status related to her heart.  Her diabetes continues to be very poorly controlled, with her most recent A1c being 14.  She attributes this to issues with her insulin and pump and states that she has been struggling with her insurance company to get a replacement for over 6 months now. She has an initial appointment with endocrinology later today.  She reports having an EGD/Colonoscopy in [place] by Dr. Braulio Conte maybe 10-15 years ago to evaluate bloating and abdominal pain.  She says that she did have polyps removed.     She reports problems with general anesthesia in the past ('I died three times during surgery').  This occurred during her tubal ligation in 1995, hysterectomy in 2008 and a sinus surgery sometime  between the two.  She had a recent eye surgery with propofol which was uneventful.     She has a history of significant CAD diagnosed with July 2022 with 90% Lcx stenosis treated with DES.  TTE at that time showed EF 25-30%.   cMRI in Feb 2023 showed an LVEF of 43% and myocardial infarction-pattern scarring consistent with substantial LAD territory MI. Repeat cath on Sep 20, 2021 showed progression of LAD disease with diffuse diabetic CAD in mid/distal LAD, but no target for intervention.  EF 35-45%. A recent TTE showed an EF 35-40% with regional wall motion abnormalities and moderately decreased LV function  Past Medical History:  Diagnosis Date   AC (acromioclavicular) joint bone spurs    Anemia    Anxiety    Arthritis    Bronchitis    Bulging lumbar disc    Bursitis    CHF (congestive heart failure)    Complication of anesthesia    "has died 3 separate times under anesthesia"   Depression    Diabetes    Environmental allergies    Fibromyalgia    High blood pressure    High cholesterol    History of COVID-19 01/2020   was hospitalized   History of endometriosis    History of uterine fibroid    Migraines    Neuropathy    eyes , hands and feet    Pinched nerve    Pneumonia    Seizure  after eye test only   Spondylosis    Tachycardia    after COVID   Tendonitis    Vision abnormalities      Past Surgical History:  Procedure Laterality Date   CORONARY STENT INTERVENTION N/A 02/19/2021   Procedure: CORONARY STENT INTERVENTION;  Surgeon: Corky Crafts, MD;  Location: [facility] INVASIVE CV LAB;  Service: Cardiovascular;  Laterality: N/A;   NASAL SINUS SURGERY     ORIF ANKLE FRACTURE Left 01/04/2021   Procedure: OPEN REDUCTION INTERNAL FIXATION (ORIF) LEFT BIMALLEOLAR ANKLE FRACTURE;  Surgeon: Kathryne Hitch, MD;  Location: [facility] ORS;  Service: Orthopedics;  Laterality: Left;   RIGHT/LEFT HEART CATH AND CORONARY ANGIOGRAPHY N/A 02/19/2021   Procedure: RIGHT/LEFT  HEART CATH AND CORONARY ANGIOGRAPHY;  Surgeon: Corky Crafts, MD;  Location: [facility] INVASIVE CV LAB;  Service: Cardiovascular;  Laterality: N/A;   RIGHT/LEFT HEART CATH AND CORONARY ANGIOGRAPHY N/A 09/20/2021   Procedure: RIGHT/LEFT HEART CATH AND CORONARY ANGIOGRAPHY;  Surgeon: Dolores Patty, MD;  Location: [facility] INVASIVE CV LAB;  Service: Cardiovascular;  Laterality: N/A;   TUBAL LIGATION     VAGINAL HYSTERECTOMY     Family History  Problem Relation Age of Onset   Anemia Mother    High Cholesterol Mother    Diabetes Mother    Arthritis Mother    Diabetes type I Maternal Grandmother    Colon cancer Neg Hx    Esophageal cancer Neg Hx    Stomach cancer Neg Hx    Colon polyps Neg Hx    Social History   Tobacco Use   Smoking status: Never   Smokeless tobacco: Never  Substance Use Topics   Alcohol use: Not Currently    Comment: special occassions   Drug use: No   Current Outpatient Medications  Medication Sig Dispense Refill   aspirin 81 MG chewable tablet Chew by mouth daily.     atorvastatin (LIPITOR) 80 MG tablet Take 1 tablet (80 mg total) by mouth daily. 90 tablet 3   benzonatate (TESSALON PERLES) 100 MG capsule Take 1 capsule (100 mg total) by mouth every 6 (six) hours as needed for cough. 30 capsule 1   cholecalciferol (VITAMIN D) 1000 UNITS tablet Take 1,000 Units by mouth daily.     clopidogrel (PLAVIX) 75 MG tablet TAKE 1 TABLET BY MOUTH EVERY DAY 90 tablet 3   Continuous Blood Gluc Receiver (DEXCOM G7 RECEIVER) DEVI 1 each by Does not apply route as needed. 1 each 0   Continuous Blood Gluc Sensor (DEXCOM G7 SENSOR) MISC Use to monitor blood sugar as directed 9 each 3   dicyclomine (BENTYL) 20 MG tablet TAKE 1 TABLET BY MOUTH EVERY 6 HOURS. 360 tablet 1   fluticasone (FLONASE) 50 MCG/ACT nasal spray Place 1 spray into both nostrils daily. 15.8 mL 2   furosemide (LASIX) 40 MG tablet TAKE 1 TABLET BY MOUTH DAILY AS NEEDED 90 tablet 2   glucose blood (BAYER CONTOUR  NEXT TEST) test strip Test 8-10 times per day 8-10 lancets/day 300 each 11   insulin aspart (NOVOLOG) 100 UNIT/ML injection Inject 10 units with meals three times a day. Inject 20 units for BS > 400, 15 units for BS 300-399, 10 units for BS  200-299 and 5 units for BS 100-199 10 mL 11   insulin degludec (TRESIBA FLEXTOUCH) 100 UNIT/ML FlexTouch Pen Inject 40 Units into the skin daily. 9 mL 3   Insulin Infusion Pump (T:SLIM X2 CONTROL-IQ PUMP) DEVI 1 each by Does not  apply route as needed. 1 each 0   Insulin Infusion Pump Supplies (PARADIGM QUICK-SET 32" ) MISC Use with novolog in insulin pump 40 each 3   Insulin Infusion Pump Supplies (T:SLIM X2 CARTRIDGE) MISC Use with insulin pump 30 each 4   loperamide (IMODIUM) 2 MG capsule Take 1 capsule (2 mg total) by mouth as needed for diarrhea or loose stools. Take 2 mg after every loose bowel movement with a maximum of 16 mg a day (maximum of 8 pills a day) 90 capsule 2   Magnesium 250 MG TABS Take 250 mg by mouth daily.     metoprolol succinate (TOPROL-XL) 50 MG 24 hr tablet TAKE 1 TABLET BY MOUTH TWICE A DAY 180 tablet 1   Multiple Vitamin (MULTIVITAMIN) capsule Take 1 capsule by mouth daily.     potassium chloride SA (KLOR-CON M) 20 MEQ tablet TAKE 1 TABLET BY MOUTH EVERY DAY 90 tablet 3   sacubitril-valsartan (ENTRESTO) 24-26 MG Take 1 tablet by mouth 2 (two) times daily. 60 tablet 8   spironolactone (ALDACTONE) 25 MG tablet TAKE 1/2 TABLET BY MOUTH EVERY DAY 45 tablet 3   Insulin Infusion Pump Supplies (T:SLIM X2/CONTROL-IQ/ACC/INSTR) MISC Use with insulin pump (Patient not taking: Reported on 11/12/2022) 1 each 1   No current facility-administered medications for this visit.   Facility-Administered Medications Ordered in Other Visits  Medication Dose Route Frequency Provider Last Rate Last Admin   sodium chloride flush (NS) 0.9 % injection 3 mL  3 mL Intravenous Q12H Bensimhon, Bevelyn Buckles, MD       Allergies  Allergen Reactions   Fentanyl  Nausea And Vomiting   Other Hives    Antibiotic specific one unsure but has tolerated antibiotic recently. Reaction was about 20 years ago.   Wound Dressing Adhesive Other (See Comments)    Blisters     Review of Systems: All systems reviewed and negative except where noted in HPI.    ECHOCARDIOGRAM COMPLETE  Result Date: 11/03/2022    ECHOCARDIOGRAM REPORT   Patient Name:   NYSHIA LEAZER Date of Exam: 11/03/2022 Medical Rec #:  [id number]     Height:       66.0 in Accession #:    [id number]    Weight:       176.2 lb Date of Birth:  03/01/1971     BSA:          1.895 m Patient Age:    51 years      BP:           124/89 mmHg Patient Gender: F             HR:           74 bpm. Exam Location:  Outpatient Procedure: 2D Echo, Cardiac Doppler, Color Doppler and Strain Analysis Indications:    CHF  History:        Patient has prior history of Echocardiogram examinations, most                 recent 09/16/2021. CHF, CAD and Previous Myocardial Infarction;                 Risk Factors:Diabetes, Hypertension, Dyslipidemia and                 Non-Smoker.  Sonographer:    Mikki Harbor Referring Phys: [id number] DANIEL R BENSIMHON  Sonographer Comments: Global longitudinal strain was attempted. IMPRESSIONS  1. Left ventricular ejection fraction, by estimation, is 35 to  40%. The left ventricle has moderately decreased function. The left ventricle demonstrates regional wall motion abnormalities (see scoring diagram/findings for description). Left ventricular  diastolic parameters are indeterminate.  2. Right ventricular systolic function is normal. The right ventricular size is normal. There is normal pulmonary artery systolic pressure. The estimated right ventricular systolic pressure is 19.8 mmHg.  3. The mitral valve is normal in structure. Mild mitral valve regurgitation. No evidence of mitral stenosis.  4. The aortic valve is tricuspid. Aortic valve regurgitation is trivial. No aortic stenosis is present.  5. The  inferior vena cava is normal in size with greater than 50% respiratory variability, suggesting right atrial pressure of 3 mmHg. FINDINGS  Left Ventricle: Left ventricular ejection fraction, by estimation, is 35 to 40%. The left ventricle has moderately decreased function. The left ventricle demonstrates regional wall motion abnormalities. The left ventricular internal cavity size was normal in size. There is no left ventricular hypertrophy. Left ventricular diastolic parameters are indeterminate.  LV Wall Scoring: The mid and distal anterior wall, mid and distal anterior septum, apical inferior segment, and apex are akinetic. The entire lateral wall, inferior wall, basal anteroseptal segment, mid inferoseptal segment, basal anterior segment, and basal inferoseptal segment are normal. Right Ventricle: The right ventricular size is normal. No increase in right ventricular wall thickness. Right ventricular systolic function is normal. There is normal pulmonary artery systolic pressure. The tricuspid regurgitant velocity is 2.05 m/s, and  with an assumed right atrial pressure of 3 mmHg, the estimated right ventricular systolic pressure is 19.8 mmHg. Left Atrium: Left atrial size was normal in size. Right Atrium: Right atrial size was normal in size. Pericardium: There is no evidence of pericardial effusion. Mitral Valve: The mitral valve is normal in structure. Mild mitral valve regurgitation. No evidence of mitral valve stenosis. MV peak gradient, 6.8 mmHg. The mean mitral valve gradient is 2.0 mmHg. Tricuspid Valve: The tricuspid valve is normal in structure. Tricuspid valve regurgitation is trivial. Aortic Valve: The aortic valve is tricuspid. Aortic valve regurgitation is trivial. No aortic stenosis is present. Aortic valve mean gradient measures 2.0 mmHg. Aortic valve peak gradient measures 4.2 mmHg. Aortic valve area, by VTI measures 2.55 cm. Pulmonic Valve: The pulmonic valve was grossly normal. Pulmonic valve  regurgitation is trivial. Aorta: The aortic root is normal in size and structure. Venous: The inferior vena cava is normal in size with greater than 50% respiratory variability, suggesting right atrial pressure of 3 mmHg. IAS/Shunts: The interatrial septum was not well visualized.  LEFT VENTRICLE PLAX 2D LVIDd:         5.00 cm     Diastology LVIDs:         3.70 cm     LV e' medial:    9.36 cm/s LV PW:         1.00 cm     LV E/e' medial:  10.4 LV IVS:        0.80 cm     LV e' lateral:   7.94 cm/s LVOT diam:     1.90 cm     LV E/e' lateral: 12.2 LV SV:         55 LV SV Index:   29 LVOT Area:     2.84 cm  LV Volumes (MOD) LV vol d, MOD A2C: 72.9 ml LV vol d, MOD A4C: 67.1 ml LV vol s, MOD A2C: 37.6 ml LV vol s, MOD A4C: 41.7 ml LV SV MOD A2C:     35.3  ml LV SV MOD A4C:     67.1 ml LV SV MOD BP:      29.6 ml RIGHT VENTRICLE RV Basal diam:  3.15 cm RV Mid diam:    2.60 cm RV S prime:     9.68 cm/s TAPSE (M-mode): 2.0 cm LEFT ATRIUM           Index        RIGHT ATRIUM           Index LA diam:      4.20 cm 2.22 cm/m   RA Area:     13.60 cm LA Vol (A2C): 36.3 ml 19.16 ml/m  RA Volume:   31.90 ml  16.83 ml/m LA Vol (A4C): 41.5 ml 21.90 ml/m  AORTIC VALVE                    PULMONIC VALVE AV Area (Vmax):    2.28 cm     PV Vmax:       0.67 m/s AV Area (Vmean):   2.42 cm     PV Peak grad:  1.8 mmHg AV Area (VTI):     2.55 cm AV Vmax:           102.00 cm/s AV Vmean:          66.700 cm/s AV VTI:            0.216 m AV Peak Grad:      4.2 mmHg AV Mean Grad:      2.0 mmHg LVOT Vmax:         82.20 cm/s LVOT Vmean:        56.900 cm/s LVOT VTI:          0.194 m LVOT/AV VTI ratio: 0.90  AORTA Ao Root diam: 2.70 cm MITRAL VALVE               TRICUSPID VALVE MV Area (PHT): 4.26 cm    TR Peak grad:   16.8 mmHg MV Area VTI:   1.79 cm    TR Vmax:        205.00 cm/s MV Peak grad:  6.8 mmHg MV Mean grad:  2.0 mmHg    SHUNTS MV Vmax:       1.30 m/s    Systemic VTI:  0.19 m MV Vmean:      71.4 cm/s   Systemic Diam: 1.90 cm MV Decel  Time: 178 msec MV E velocity: 96.90 cm/s MV A velocity: 79.10 cm/s MV E/A ratio:  1.23 Epifanio Lesches MD Electronically signed by Epifanio Lesches MD Signature Date/Time: 11/03/2022/12:30:11 PM    Final     Physical Exam: BP 122/78   Pulse 85   Ht 5\' 6"  (1.676 m)   Wt 179 lb (81.2 kg)   BMI 28.89 kg/m  Constitutional: Pleasant,well-developed, Caucasian female in no acute distress.  Accompanied by husband HEENT: Normocephalic and atraumatic. Conjunctivae are normal. No scleral icterus. Neck supple.  Cardiovascular: Normal rate, regular rhythm.  Pulmonary/chest: Effort normal and breath sounds normal. No wheezing, rales or rhonchi. Abdominal: Soft, nondistended, nontender. Bowel sounds active throughout. There are no masses palpable. No hepatomegaly. Extremities: no edema Neurological: Alert and oriented to person place and time. Skin: Skin is warm and dry. No rashes noted. Psychiatric: Normal mood and affect. Behavior is normal.  CBC    Component Value Date/Time   WBC 8.5 11/05/2021 1446   RBC 5.14 (H) 11/05/2021 1446   HGB 14.3 11/05/2021 1446   HGB  15.0 09/13/2021 1409   HCT 42.9 11/05/2021 1446   HCT 45.2 09/13/2021 1409   PLT 266 11/05/2021 1446   PLT 271 09/13/2021 1409   MCV 83.5 11/05/2021 1446   MCV 85 09/13/2021 1409   MCH 27.8 11/05/2021 1446   MCHC 33.3 11/05/2021 1446   RDW 13.1 11/05/2021 1446   RDW 12.5 09/13/2021 1409   LYMPHSABS 1.2 02/15/2021 0409   MONOABS 0.7 02/15/2021 0409   EOSABS 0.0 02/15/2021 0409   BASOSABS 0.0 02/15/2021 0409    CMP     Component Value Date/Time   NA 137 10/16/2022 0907   NA 141 09/13/2021 1409   K 4.5 10/16/2022 0907   CL 104 10/16/2022 0907   CO2 26 10/16/2022 0907   GLUCOSE 320 (H) 10/16/2022 0907   BUN 13 10/16/2022 0907   BUN 28 (H) 09/13/2021 1409   CREATININE 0.92 10/16/2022 0907   CALCIUM 9.1 10/16/2022 0907   PROT 6.9 06/16/2022 0954   ALBUMIN 4.2 06/16/2022 0954   AST 24 06/16/2022 0954   ALT 26  06/16/2022 0954   ALKPHOS 111 06/16/2022 0954   BILITOT 1.5 (H) 06/16/2022 0954   GFRNONAA >60 10/16/2022 0907   GFRAA >60 03/07/2020 0130     ASSESSMENT AND PLAN:  52 year old female with poorly controlled Type I DM, CAD with hx DES and diffuse CAD not amenable to intervention with depressed EF, with persistent diarrhea, abdominal pain.  Testing negative for infectious causes, inflammatory diarrhea and EPI.  Suspect IBS vs microscopic colitis.  Diarrhea secondary to hyperglycemia/diabetes also very possible.  She is still taking imodium on an as needed basis, and I recommended she started taking it on a scheduled basis for now to reduce her stool frequency and urgency.  Taking a dose at night before bed should help prevent her nocturnal diarrhea, and taking a 1-2 doses during the day should reduce the number of stools during the day.  She should avoid foods with artificial sweeteners (she frequently drinks artificially flavored water).  Will try IBGard for help with abdominal pain. I am hopeful her GI symptoms will improve with improved glycemic control.  A colonoscopy is warranted to exclude microscopic colitis (as well as for colon cancer screening), but she remains a significant risk from an anesthesia standpoint.   Will follow up in 8-12 weeks and reassess symptoms and reconsider colonoscopy. A trial of Rifaximin  Chronic diarrhea/abdominal pain, suspect IBS - Scheduled loperamide; take 1 tb po qam and qhs; can titrate up dosing, max 8/day - IBGard PRN abdominal pain - F/U endocrinology to improve diabetic control - Avoid dairy, artificial sweeteners - F/U 8 weeks; start Rifaximin or Viberzi if no improvement - Will discuss risks/benefits of colonoscopy again at follow up  Shemeika Starzyk E. Tomasa Rand, MD  Gastroenterology  CC:  Rocky Morel, DO

## 2022-11-12 NOTE — Patient Instructions (Signed)
Take over the counter loperamide two tablets by mouth every morning, one tablet at lunch and one tablet at bedtime.  You have bee over the counter IBGard samples take as directed. This also can be purchased over the counter if this helps your symptoms.   You have been given a testing kit to check for small intestine bacterial overgrowth (SIBO) which is completed by a company named [employer]. Make sure to return your test in the mail using the return mailing label given to you along with the kit. The test order, your demographic and insurance information have all already been sent to the company. [facility] will collect an upfront charge of $99.74 for commercial insurance plans and $209.74 is you are paying cash. Make sure to discuss with [facility] PRIOR to having the test to see if they have gotten information from your insurance company as to how much your testing will cost out of pocket, if any. Please contact [facility] at phone number [phone] to get instructions regarding how to perform the test as our office is unable to give specific testing instructions.  The [facility] providers would like to encourage you to use [facility] to communicate with providers for non-urgent requests or questions.  Due to long hold times on the telephone, sending your provider a message by [facility] may be a faster and more efficient way to get a response.  Please allow 48 business hours for a response.  Please remember that this is for non-urgent requests.   Thank you for choosing me and  Gastroenterology.  Tiajuana Amass, MD

## 2022-11-12 NOTE — Telephone Encounter (Signed)
Endo visit cancelled because they were out of network, found one one in network that needs referral to:   Dr. Ocie Cornfield [phone])

## 2022-11-26 ENCOUNTER — Ambulatory Visit: Payer: Self-pay | Admitting: *Deleted

## 2022-11-27 NOTE — Patient Instructions (Signed)
Visit Information  Thank you for taking time to visit with me today. Please don't hesitate to contact me if I can be of assistance to you.   Following are the goals we discussed today:   Goals Addressed             This Visit's Progress    Depression/Adjustment to life changes        Activities and task to complete in order to accomplish goals.    Continue to work on pump approval- with help of PCP office assistance- paperwork pending at PCP office for completion?  Call your insurance provider for more information about your Enhanced Benefits (mental health outpatient copay?glad you have been able to change your insurance to one that covers RX,etc) Consider/continue visits with Christen Butter, Integrated Behavioral Health provider, as desired Plan to see Endocriniolgist as planned for initial visit in September- seek wait list/cancellations with them for visit possibly sooner Continue to feel empowered as we discussed Keep enjoying your gardening and prevent allergy symptoms as best you can           Our next appointment is by telephone on 12/12/22    Please call the care guide team at [phone] if you need to cancel or reschedule your appointment.   If you are experiencing a Mental Health or Behavioral Health Crisis or need someone to talk to, please call the Suicide and Crisis Lifeline: 988 call 911   The patient verbalized understanding of instructions, educational materials, and care plan provided today and DECLINED offer to receive copy of patient instructions, educational materials, and care plan.   Telephone follow up appointment with care management team member scheduled for: 5/`17/24 Reece Levy, MSW, LCSW Clinical Social Worker [employer] [phone]

## 2022-11-27 NOTE — Patient Outreach (Signed)
  Care Coordination   Follow Up Visit Note   11/27/2022 Name: Amanda Frederick MRN: [id number] DOB: 01-21-1971  Amanda Frederick is a 52 y.o. year old female who sees Rocky Morel, DO for primary care. I spoke with  Lesli Albee by phone today.  What matters to the patients health and wellness today?  Garden is growing and enjoying it- despite the allergies caused by the pollen.    Goals Addressed             This Visit's Progress    Depression/Adjustment to life changes        Activities and task to complete in order to accomplish goals.    Continue to work on pump approval- with help of PCP office assistance- paperwork pending at PCP office for completion?  Call your insurance provider for more information about your Enhanced Benefits (mental health outpatient copay?glad you have been able to change your insurance to one that covers RX,etc) Consider/continue visits with Christen Butter, Integrated Behavioral Health provider, as desired Plan to see Endocriniolgist as planned for initial visit in September- seek wait list/cancellations with them for visit possibly sooner Continue to feel empowered as we discussed Keep enjoying your gardening and prevent allergy symptoms as best you can           SDOH assessments and interventions completed:  Yes     Care Coordination Interventions:  Yes, provided  Interventions Today    Flowsheet Row Most Recent Value  Chronic Disease   Chronic disease during today's visit Diabetes  [Pt is working with Lupita Leash, RD at PCP office to get paperwork completed for approval  of pump]  General Interventions   General Interventions Discussed/Reviewed Community Resources  Mental Health Interventions   Mental Health Discussed/Reviewed Coping Strategies, Mental Health Discussed, Mental Health Reviewed, Anxiety, Other  [Pt reports some frustrations- discussed with CSW today. Suggestions made for helping to eliminate some of this and pt plans to implement as she  can/is comfortable.]       Follow up plan: Follow up call scheduled for 12/12/22    Encounter Outcome:  Pt. Visit Completed

## 2022-12-02 ENCOUNTER — Telehealth: Payer: Self-pay | Admitting: Dietician

## 2022-12-04 DIAGNOSIS — E108 Type 1 diabetes mellitus with unspecified complications: Secondary | ICD-10-CM | POA: Diagnosis not present

## 2022-12-04 DIAGNOSIS — Z794 Long term (current) use of insulin: Secondary | ICD-10-CM | POA: Diagnosis not present

## 2022-12-04 DIAGNOSIS — E1049 Type 1 diabetes mellitus with other diabetic neurological complication: Secondary | ICD-10-CM | POA: Diagnosis not present

## 2022-12-04 DIAGNOSIS — E1065 Type 1 diabetes mellitus with hyperglycemia: Secondary | ICD-10-CM | POA: Diagnosis not present

## 2022-12-04 DIAGNOSIS — E109 Type 1 diabetes mellitus without complications: Secondary | ICD-10-CM | POA: Diagnosis not present

## 2022-12-11 NOTE — Telephone Encounter (Signed)
Unable to reach patient by phone 

## 2022-12-12 ENCOUNTER — Ambulatory Visit: Payer: Self-pay | Admitting: *Deleted

## 2022-12-12 NOTE — Patient Outreach (Signed)
  Care Coordination   Follow Up Visit Note   12/12/2022 Name: Amanda Frederick MRN: [id number] DOB: May 25, 1971  Amanda Frederick is a 52 y.o. year old female who sees Rocky Morel, DO for primary care. I spoke with  Amanda Frederick by phone today.  What matters to the patients health and wellness today?  "I finally got my insulin pump".    Goals Addressed             This Visit's Progress    Depression/Adjustment to life changes        Activities and task to complete in order to accomplish goals.    So glad you received  your insulin pump! Call your insurance provider for more information about your Enhanced Benefits (mental health outpatient copay?glad you have been able to change your insurance to one that covers RX,etc) Consider/continue visits with Christen Butter, Integrated Behavior[facility] provider, as desired Plan to see Endocriniolgist as planned for initial visit in September- seek wait list/cancellations with them for visit possibly sooner Continue to feel empowered/valuing self and speaking up as we discussed Keep enjoying your gardening and prevent allergy symptoms as best you can           SDOH assessments and interventions completed:  Yes  SDOH Interventions Today    Flowsheet Row Most Recent Value  SDOH Interventions   Depression Interventions/Treatment  Medication, Currently on Treatment        Care Coordination Interventions:  Yes, provided  Interventions Today    Flowsheet Row Most Recent Value  Chronic Disease   Chronic disease during today's visit Diabetes  General Interventions   General Interventions Discussed/Reviewed General Interventions Discussed  [Pt has received her insulin pump and reports it is going pretty well- will advise RNCM and RD]  Mental Health Interventions   Mental Health Discussed/Reviewed Mental Health Discussed, Mental Health Reviewed, Coping Strategies, Depression  [Pt's depression symptoms continue to improve- scoring today "4"  which is down/better from previous scores of "8" and "14". Pt shared she is speaking up more,  feeling empowered to express her feelings/needs/wants and "be heard".]       Follow up plan: Follow up call scheduled for 01/02/23    Encounter Outcome:  Pt. Visit Completed

## 2022-12-12 NOTE — Patient Instructions (Signed)
Visit Information  Thank you for taking time to visit with me today. Please don't hesitate to contact me if I can be of assistance to you.   Following are the goals we discussed today:   Goals Addressed             This Visit's Progress    Depression/Adjustment to life changes        Activities and task to complete in order to accomplish goals.    So glad you received  your insulin pump! Call your insurance provider for more information about your Enhanced Benefits (mental health outpatient copay?glad you have been able to change your insurance to one that covers RX,etc) Consider/continue visits with Christen Butter, Integrated Behavior[facility] provider, as desired Plan to see Endocriniolgist as planned for initial visit in September- seek wait list/cancellations with them for visit possibly sooner Continue to feel empowered/valuing self and speaking up as we discussed Keep enjoying your gardening and prevent allergy symptoms as best you can           Our next appointment is by telephone on 01/02/23   Please call the care guide team at [phone] if you need to cancel or reschedule your appointment.   If you are experiencing a Mental Health or Behavioral Health Crisis or need someone to talk to, please call the Suicide and Crisis Lifeline: 988 call 911   The patient verbalized understanding of instructions, educational materials, and care plan provided today and DECLINED offer to receive copy of patient instructions, educational materials, and care plan.   Telephone follow up appointment with care management team member scheduled for:01/02/23  Reece Levy, MSW, LCSW Clinical Social Worker [facility] [phone]

## 2022-12-17 ENCOUNTER — Ambulatory Visit: Payer: 59

## 2023-01-02 ENCOUNTER — Ambulatory Visit: Payer: Self-pay | Admitting: *Deleted

## 2023-01-02 ENCOUNTER — Ambulatory Visit
Admission: RE | Admit: 2023-01-02 | Discharge: 2023-01-02 | Disposition: A | Discharge status: Home / Self Care | Payer: 59 | Source: Ambulatory Visit | Attending: Internal Medicine | Admitting: Internal Medicine

## 2023-01-02 DIAGNOSIS — Z1231 Encounter for screening mammogram for malignant neoplasm of breast: Secondary | ICD-10-CM

## 2023-01-02 NOTE — Patient Instructions (Signed)
Visit Information  Thank you for taking time to visit with me today. Please don't hesitate to contact me if I can be of assistance to you.   Following are the goals we discussed today:   Goals Addressed             This Visit's Progress    Depression/Adjustment to life changes        Activities and task to complete in order to accomplish goals.    So glad you received  your insulin pump! Call your insurance provider for more information about your Enhanced Benefits (mental health outpatient copay?glad you have been able to change your insurance to one that covers RX,etc) Consider/continue visits with Christen Butter, Integrated Behavior[facility] provider, as desired Plan to see Endocriniolgist as planned for initial visit now in August instead of September- seek wait list/cancellations with them for visit possibly sooner Continue to feel empowered/valuing self and speaking up as we discussed Keep enjoying your gardening and prevent allergy symptoms as best you can           Our next appointment is by telephone on 02/04/23  Please call the care guide team at [phone] if you need to cancel or reschedule your appointment.   If you are experiencing a Mental Health or Behavioral Health Crisis or need someone to talk to, please call the Suicide and Crisis Lifeline: 988 call 911   The patient verbalized understanding of instructions, educational materials, and care plan provided today and DECLINED offer to receive copy of patient instructions, educational materials, and care plan.   Telephone follow up appointment with care management team member scheduled for: 02/04/23  Reece Levy, MSW, LCSW Clinical Social Worker [facility] [phone]

## 2023-01-02 NOTE — Patient Outreach (Signed)
  Care Coordination   Follow Up Visit Note   01/02/2023 Name: Jillyan Plitt MRN: [id number] DOB: 1971-07-21  Stephanye Finnicum is a 52 y.o. year old female who sees Rocky Morel, DO for primary care. I spoke with  Lesli Albee by phone today.  What matters to the patients health and wellness today?  Talked with daughter about moving out (July 2025)     Goals Addressed             This Visit's Progress    Depression/Adjustment to life changes        Activities and task to complete in order to accomplish goals.    So glad you received  your insulin pump! Call your insurance provider for more information about your Enhanced Benefits (mental health outpatient copay?glad you have been able to change your insurance to one that covers RX,etc) Consider/continue visits with Christen Butter, Integrated Behavior[facility] provider, as desired Plan to see Endocriniolgist as planned for initial visit now in August instead of September- seek wait list/cancellations with them for visit possibly sooner Continue to feel empowered/valuing self and speaking up as we discussed Keep enjoying your gardening and prevent allergy symptoms as best you can           SDOH assessments and interventions completed:  Yes     Care Coordination Interventions:  Yes, provided  Interventions Today    Flowsheet Row Most Recent Value  Chronic Disease   Chronic disease during today's visit Other  [depression/stress]  Mental Health Interventions   Mental Health Discussed/Reviewed Mental Health Discussed, Coping Strategies, Depression  [Pt feeling better- has been able to enjoy gardening, canning vegetables and graduations. She also had a productive talk with her daughter and has asked that she and her family plan to move out by July 2025. Overall feels things are falling into place]       Follow up plan: Follow up call scheduled for 02/04/23    Encounter Outcome:  Pt. Visit Completed

## 2023-01-07 IMAGING — US US THORACENTESIS ASP PLEURAL SPACE W/IMG GUIDE
1 series · 6 of 6 positions shown · non-contrast
Comparison: none

INDICATION: Bilateral pleural effusion, left greater than right. Request for
therapeutic and diagnostic thoracentesis.

[Series 1: us thoracentesis asp pleural space w/img guide · 6 of 6 slices shown]
[im 1/6]
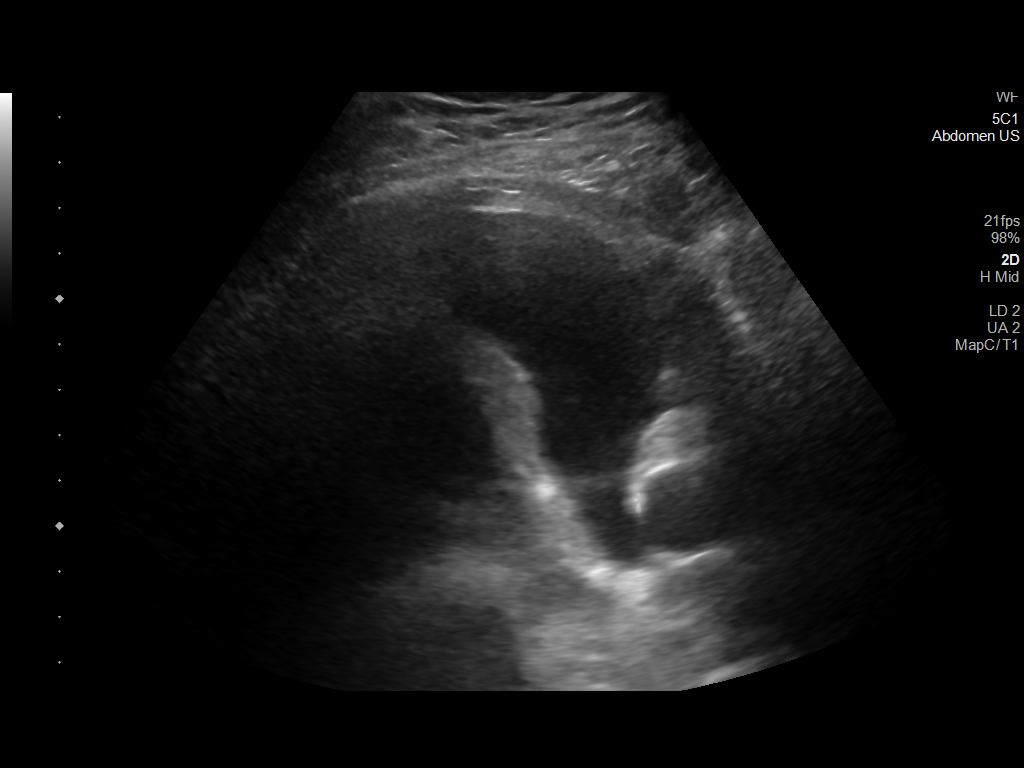
[im 2/6]
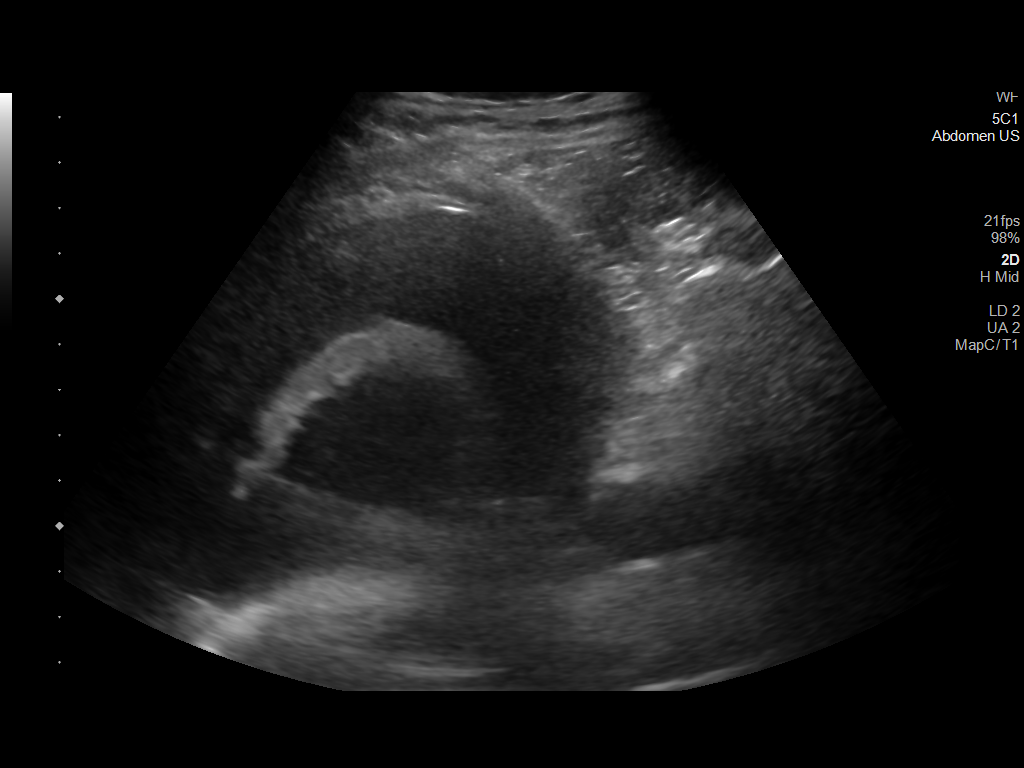
[im 3/6]
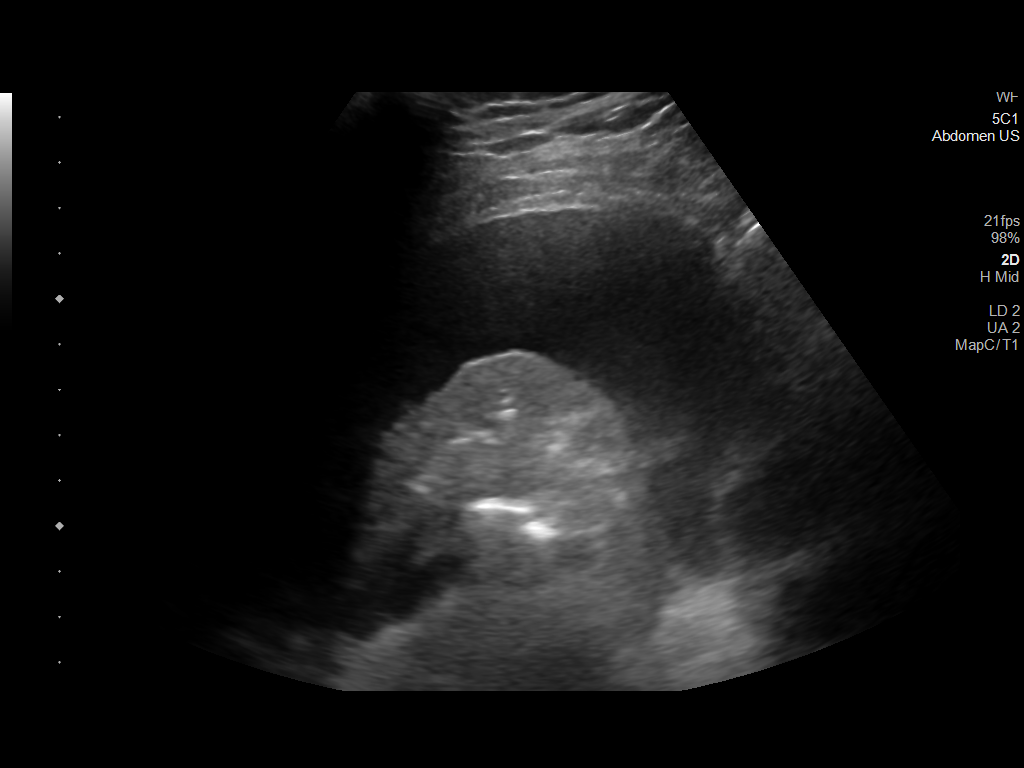
[im 4/6]
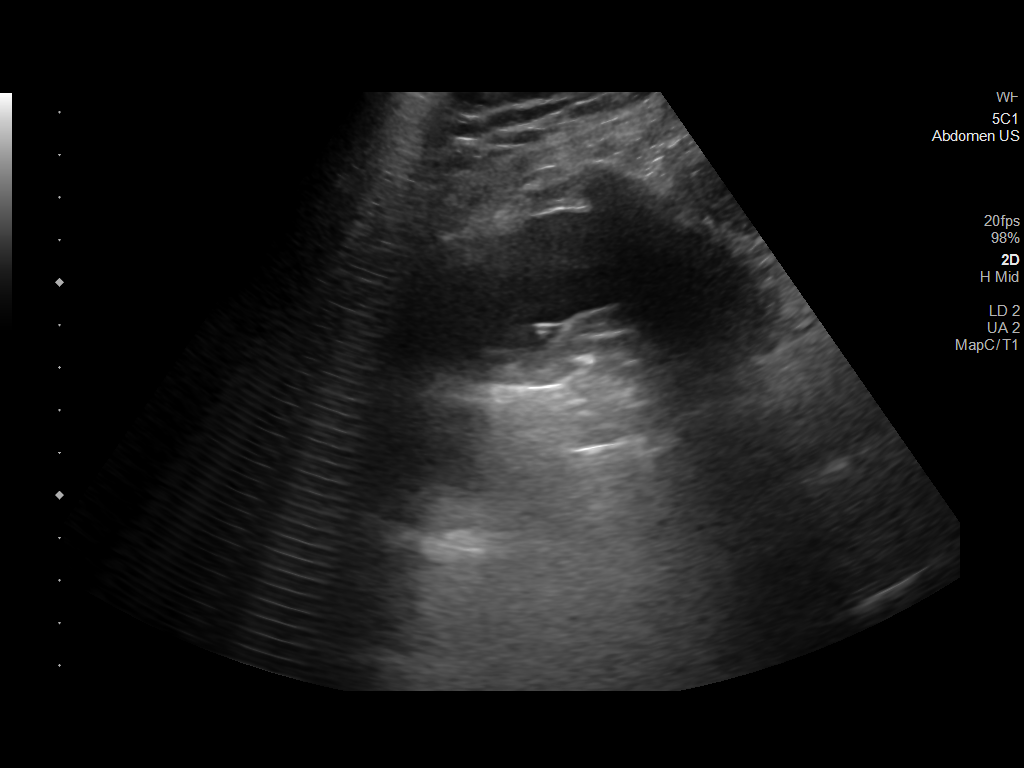
[im 5/6]
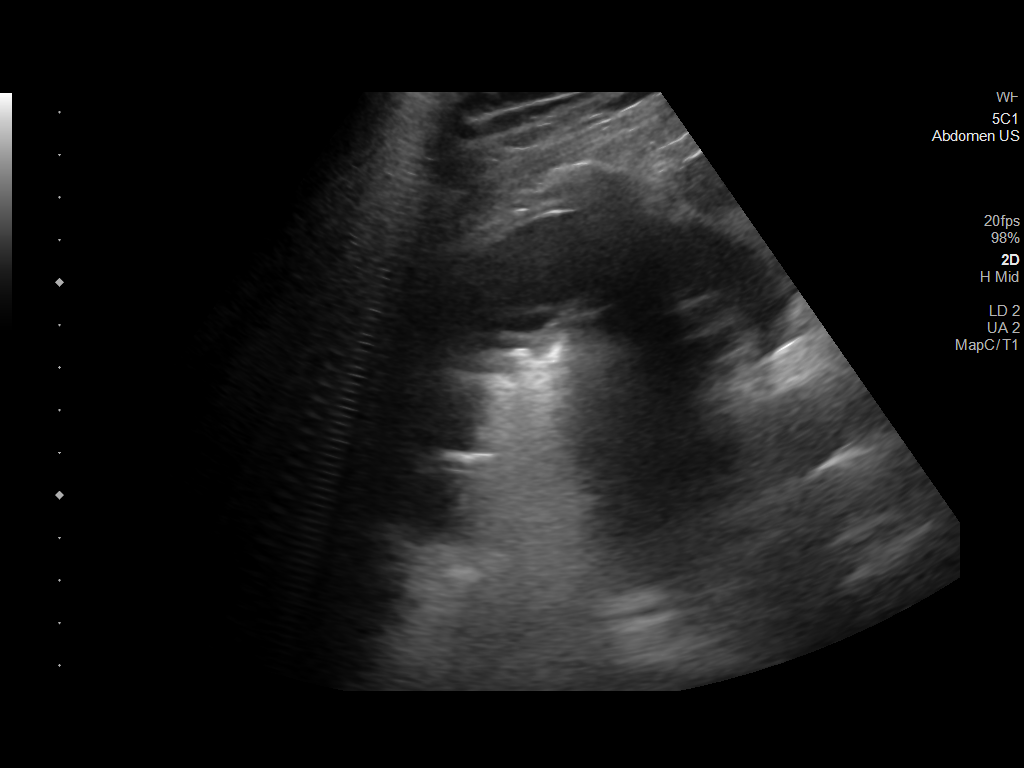
[im 6/6]
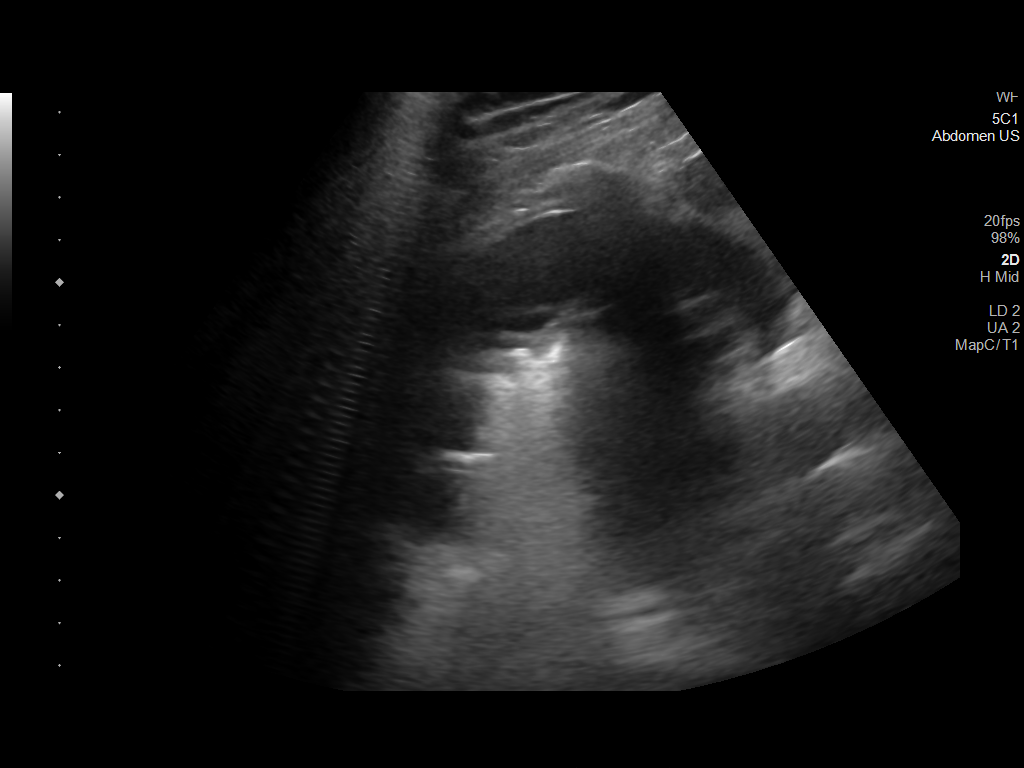

[6 of 6 positions shown; findings below may reference images not displayed]

EXAM:
ULTRASOUND GUIDED LEFT THORACENTESIS

MEDICATIONS:
10 mL 1% lidocaine

COMPLICATIONS:
None immediate.

PROCEDURE:
An ultrasound guided thoracentesis was thoroughly discussed with the
patient and questions answered. The benefits, risks, alternatives
and complications were also discussed. The patient understands and
wishes to proceed with the procedure. Written consent was obtained.

Ultrasound was performed to localize and mark an adequate pocket of
fluid in the left chest. The area was then prepped and draped in the
normal sterile fashion. 1% Lidocaine was used for local anesthesia.
Under ultrasound guidance a 6 Fr Safe-T-Centesis catheter was
introduced. Thoracentesis was performed. The catheter was removed
and a dressing applied.
FINDINGS: A total of approximately 700 cc of clear yellow fluid was removed.
Samples were sent to the laboratory as requested by the clinical
team.

Post procedure chest X-ray reviewed, negative for pneumothorax.
IMPRESSION: Successful ultrasound guided left thoracentesis yielding 700 cc of
pleural fluid.

## 2023-01-09 IMAGING — CR DG CHEST 2V
2 series · 2 of 2 positions shown · non-contrast
Comparison: 02/17/2021 and 03/02/2020

CLINICAL DATA: CHF.  Intermittent shortness of breath.

EXAM:
CHEST - 2 VIEW

[chest lat]
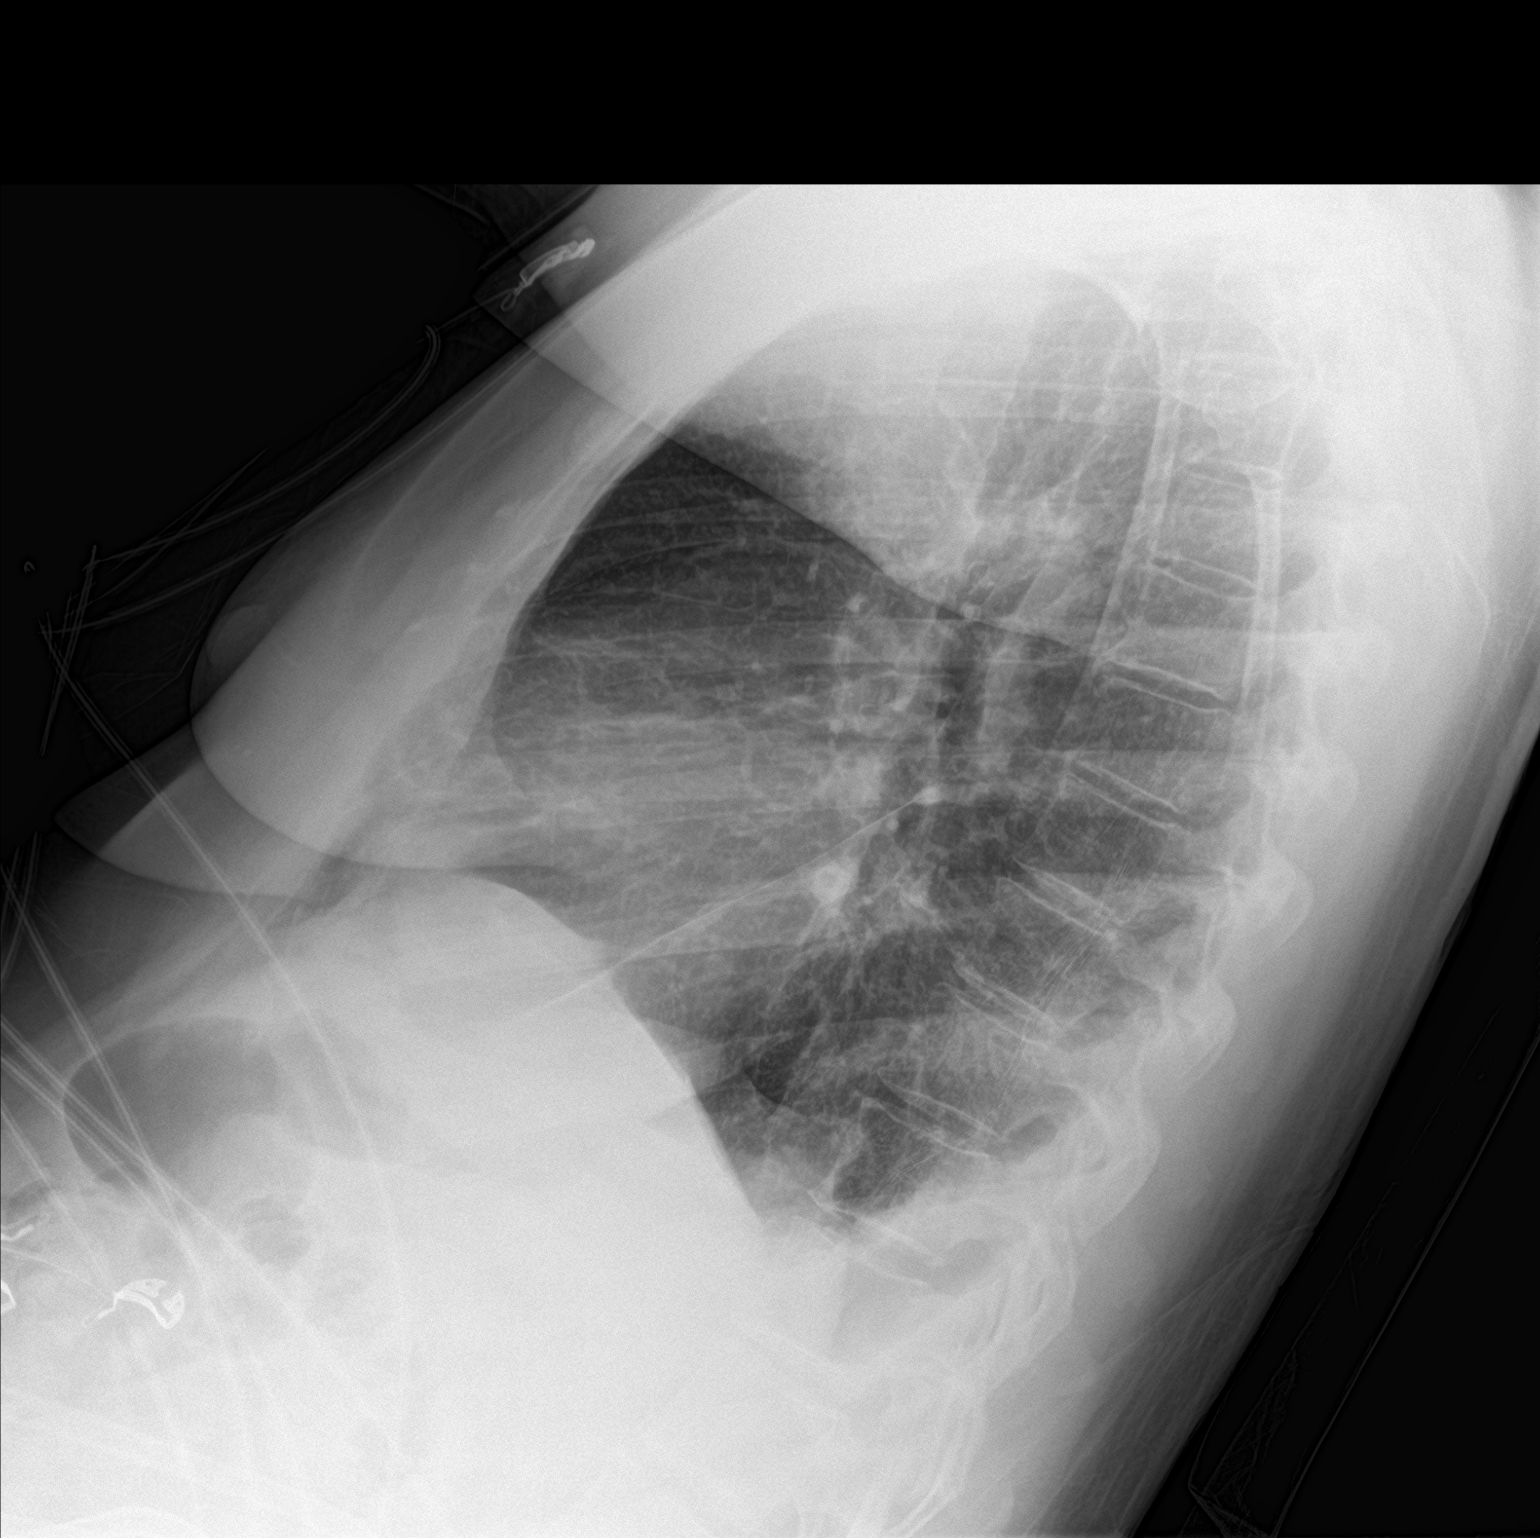

[chest ap]
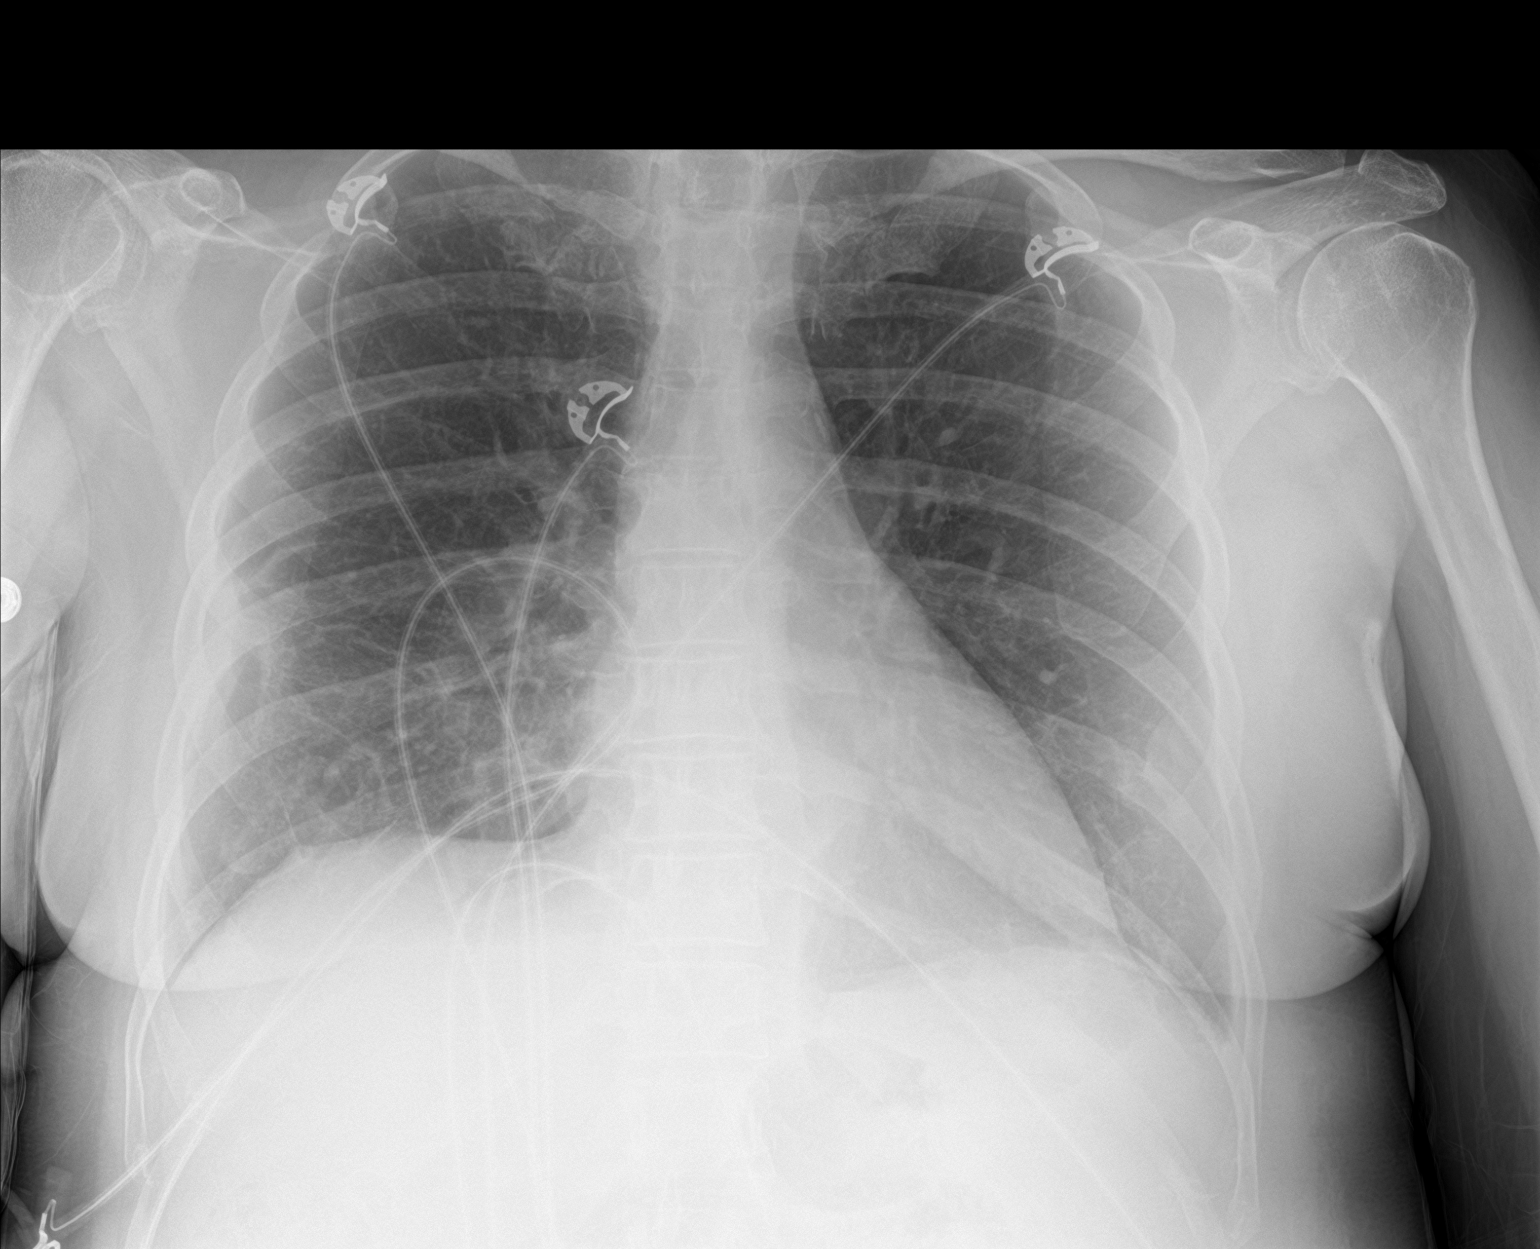

[2 of 2 positions shown; findings below may reference images not displayed]

FINDINGS: Patchy densities in the right lower chest are similar to the
previous examination. Suspect some of these densities are chronic
based on the older comparison examination. Heart size is normal
without overt pulmonary edema. Aeration at the left lung base has
improved although there may still be very small pleural effusions
based on the lateral view. Trachea is midline. No acute bone
abnormality.
IMPRESSION: 1. Improving aeration at the left lung base.
2. Probable very small pleural effusions.
3. No evidence for cardiomegaly or CHF.
4. Patchy densities in the right lower lung may represent a
combination of acute on chronic changes. Findings are similar to the
recent comparison examination.

## 2023-01-10 IMAGING — CT CT ANGIO CHEST
2 of 6 series · 18 of 46 positions shown · IV contrast (omnipaque)
Comparison: Chest radiograph from one day prior.

CLINICAL DATA: Inpatient. Left lower extremity swelling proved
increased dyspnea. PE suspected, low-intermediate probability,
positive D-dimer.

EXAM:
CT ANGIOGRAPHY CHEST WITH CONTRAST
TECHNIQUE: Multidetector CT imaging of the chest was performed using the
standard protocol during bolus administration of intravenous
contrast. Multiplanar CT image reconstructions and MIPs were
obtained to evaluate the vascular anatomy.
CONTRAST:  80mL OMNIPAQUE IOHEXOL 350 MG/ML SOLN

[Series 6: thins · axial · 0.66mm/px · z∈[+1250,+1510]mm · 15 of 286 slices shown]
[im 13/286  lung]
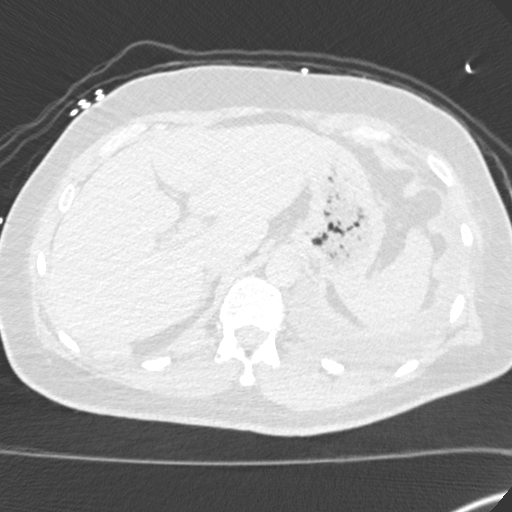
[im 38/286  soft-tissue]
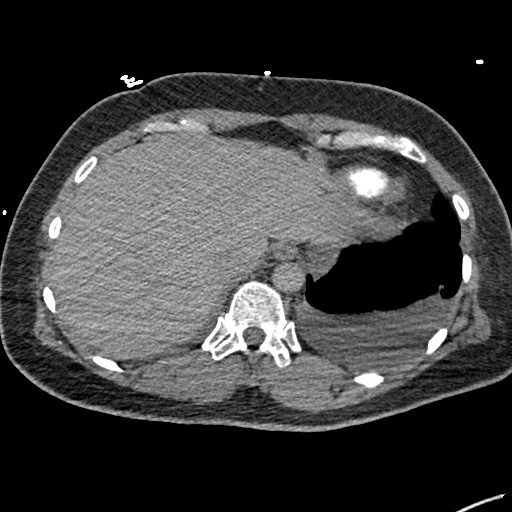
[im 50/286  lung]
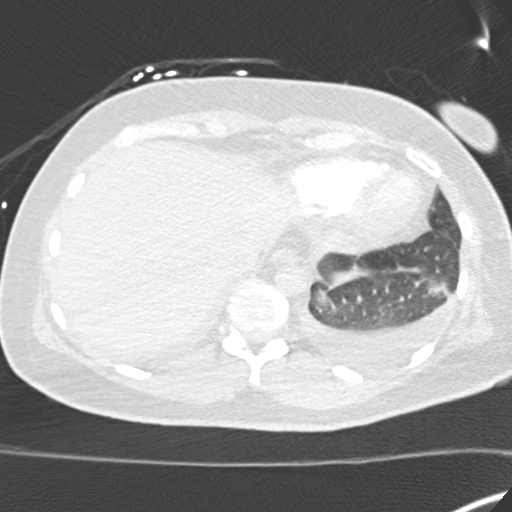
[im 75/286  soft-tissue]
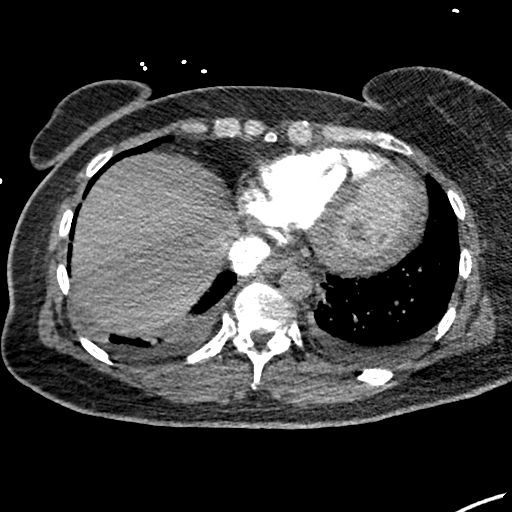
[im 87/286  lung]
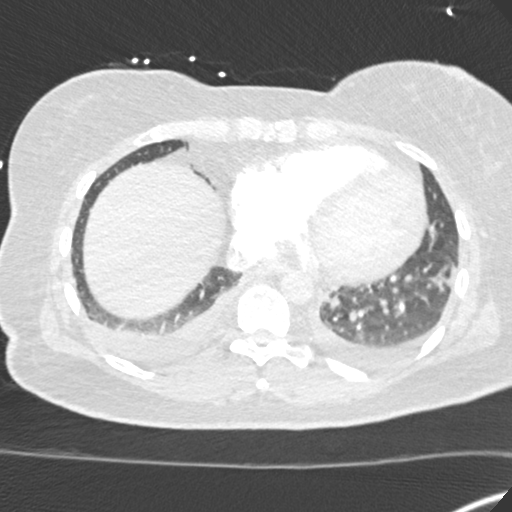
[im 112/286  soft-tissue]
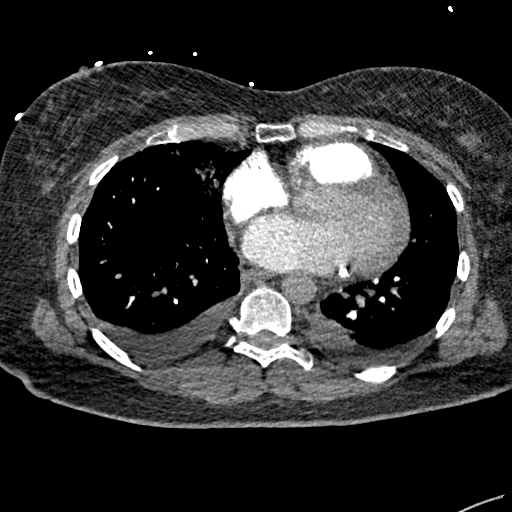
[im 124/286  lung]
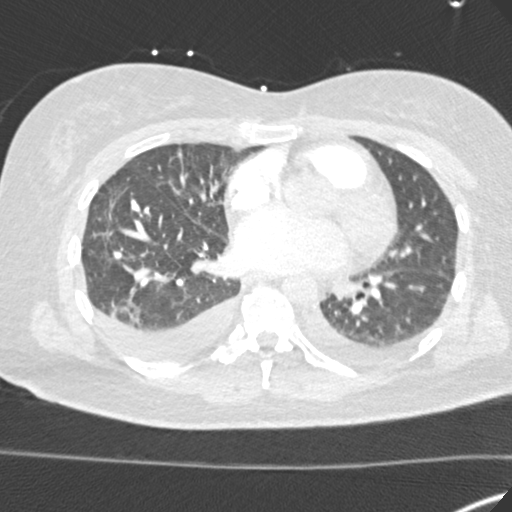
[im 149/286  soft-tissue]
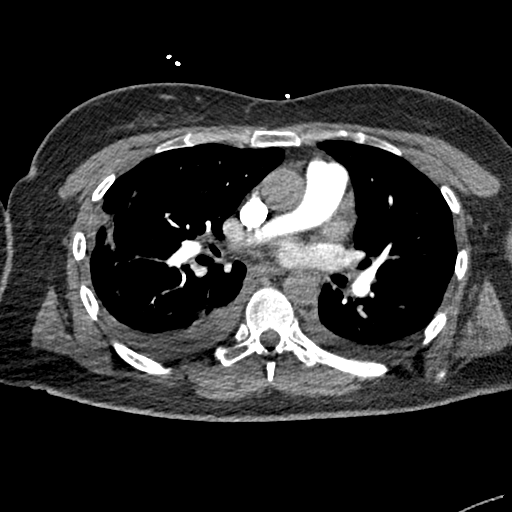
[im 162/286  lung]
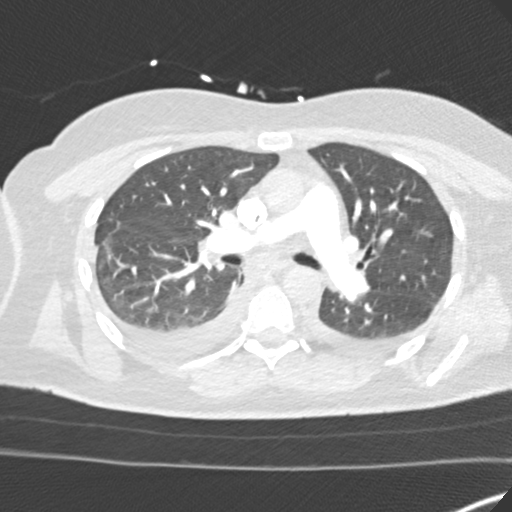
[im 174/286  soft-tissue]
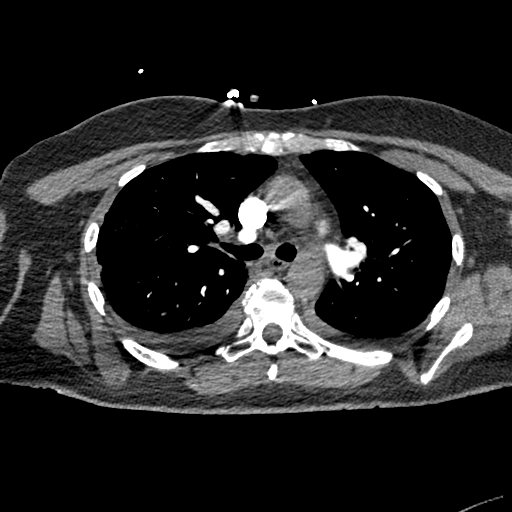
[im 199/286  lung]
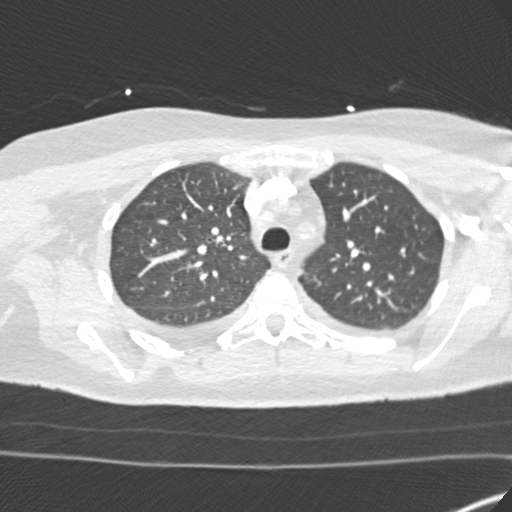
[im 211/286  soft-tissue]
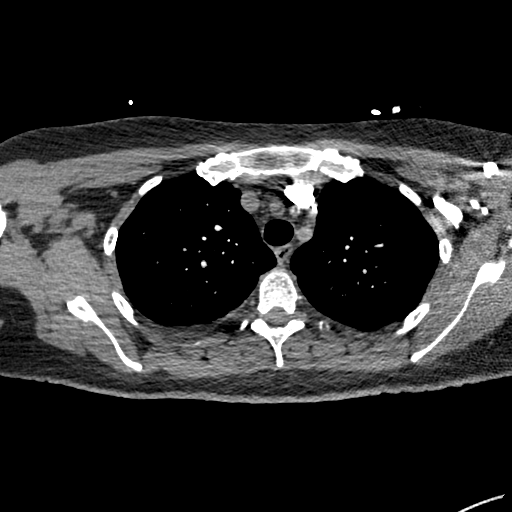
[im 236/286  lung]
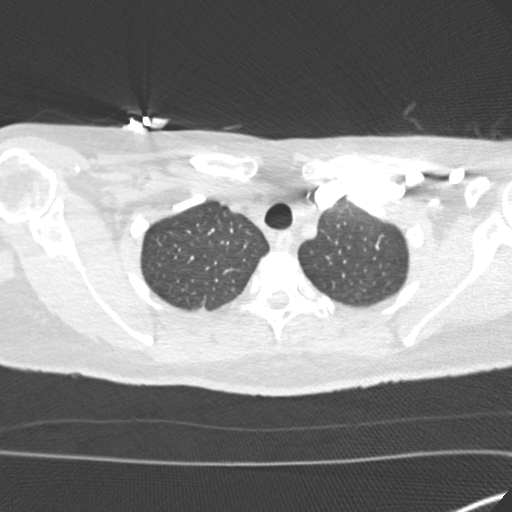
[im 248/286  soft-tissue]
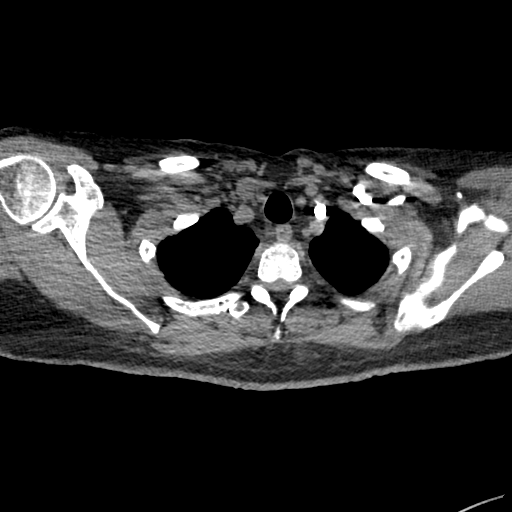
[im 273/286  lung]
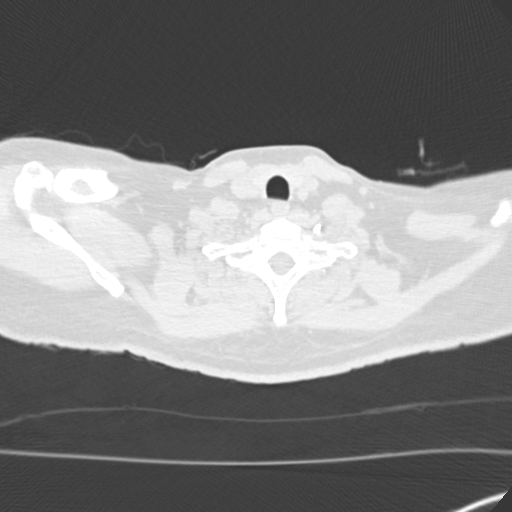

[Series 8: coronal mpr · coronal · 0.59mm/px · 3 of 108 slices shown]
[im 27/108  soft-tissue]
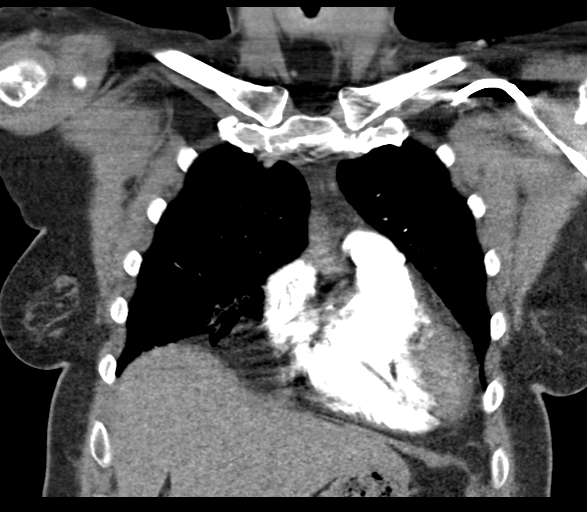
[im 54/108  soft-tissue]
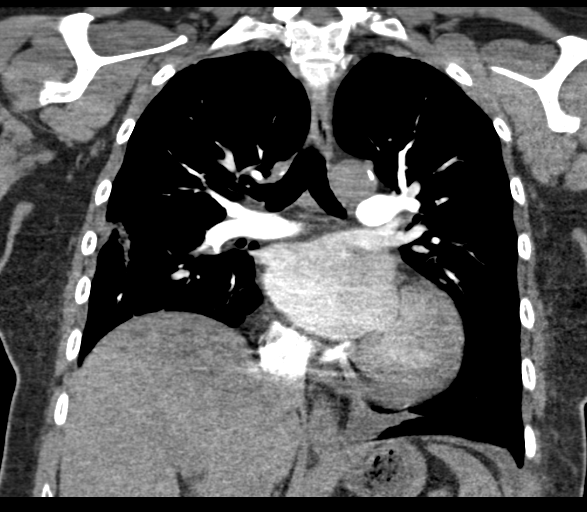
[im 81/108  soft-tissue]
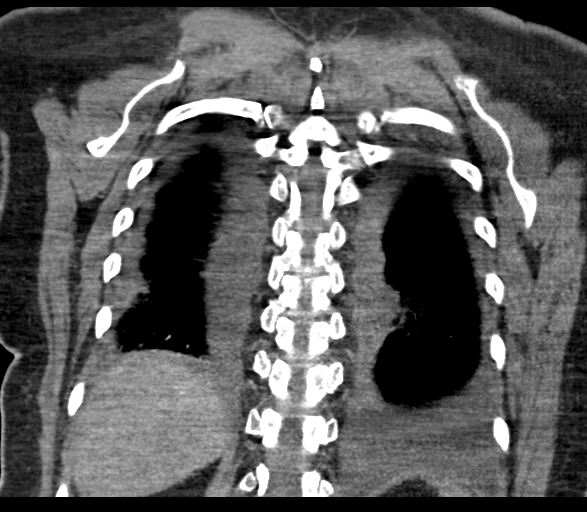

[18 of 46 positions shown; findings below may reference images not displayed]

FINDINGS: Cardiovascular: The study is moderate quality for the evaluation of
pulmonary embolism, with some motion degradation. There are no
filling defects in the central, lobar, segmental or subsegmental
pulmonary artery branches to suggest acute pulmonary embolism.
Mildly atherosclerotic nonaneurysmal thoracic aorta. Normal caliber
pulmonary arteries. Top normal heart size. No significant
pericardial fluid/thickening.

Mediastinum/Nodes: No discrete thyroid nodules. Unremarkable
esophagus. No pathologically enlarged axillary, mediastinal or hilar
lymph nodes.

Lungs/Pleura: No pneumothorax. Small dependent bilateral pleural
effusions. No acute consolidative airspace disease or lung masses.
Peripheral right lower lobe solid 5 mm pulmonary nodule (series
7/image 53). No additional significant pulmonary nodules. Mild to
moderate patchy ground-glass opacities throughout the periphery of
both lungs with scattered mildly thickened curvilinear parenchymal
bands with associated mild parenchymal distortion, most prominent in
the right middle lobe and perilobular lower lobes.

Upper abdomen: No acute abnormality.

Musculoskeletal: No aggressive appearing focal osseous lesions. Mild
thoracic spondylosis.

Review of the MIP images confirms the above findings.
IMPRESSION: 1. No pulmonary embolism.
2. Small dependent bilateral pleural effusions. Top-normal heart
size.
3. Mild to moderate patchy ground-glass opacities throughout the
periphery of both lungs with associated mild parenchymal distortion
and curvilinear parenchymal bands, most prominent in the right
middle lobe and perilobular lower lobes. Differential includes mild
pulmonary edema, atypical pneumonia and/or postinflammatory
fibrosis.
4. Right lower lobe 5 mm solid pulmonary nodule. No follow-up needed
if patient is low-risk. Non-contrast chest CT can be considered in
12 months if patient is high-risk. This recommendation follows the
consensus statement: Guidelines for Management of Incidental
Pulmonary Nodules Detected on CT Images: From the [HOSPITAL]
5. Aortic Atherosclerosis ([id number]).

## 2023-01-13 ENCOUNTER — Other Ambulatory Visit: Payer: Self-pay | Admitting: Student

## 2023-01-28 ENCOUNTER — Other Ambulatory Visit (HOSPITAL_COMMUNITY): Payer: Self-pay | Admitting: Internal Medicine

## 2023-02-04 ENCOUNTER — Ambulatory Visit: Payer: Self-pay | Admitting: *Deleted

## 2023-02-04 NOTE — Patient Outreach (Signed)
  Care Coordination   Follow Up Visit Note   02/04/2023 Name: Amanda Frederick MRN: [id number] DOB: 1970-10-24  Amanda Frederick is a 52 y.o. year old female who sees Amanda Morel, DO for primary care. I spoke with  Amanda Frederick by phone today.  What matters to the patients health and wellness today?  Garden is struggling with the heat and dry weather.    Goals Addressed             This Visit's Progress    Depression/Adjustment to life changes        Activities and task to complete in order to accomplish goals.    So glad your adjusting well to the insulin pump! 78%in range Call your insurance provider for more information about your Enhanced Benefits (mental health outpatient copay?glad you have been able to change your insurance to one that covers RX,etc) Plan to see Endocrinologist as planned for initial visit now in August instead of September- seek wait list/cancellations with them for visit possibly sooner Continue to feel empowered/valuing self and speaking up as we discussed- set house responsibilities, rules? Keep enjoying your gardening as you can           SDOH assessments and interventions completed:  Yes     Care Coordination Interventions:  Yes, provided  Interventions Today    Flowsheet Row Most Recent Value  Chronic Disease   Chronic disease during today's visit Diabetes  [doing well staying in range]  General Interventions   General Interventions Discussed/Reviewed General Interventions Discussed  Mental Health Interventions   Mental Health Discussed/Reviewed Coping Strategies, Mental Health Discussed, Other  [frustrated with home/sharing with kids and clutter- family to move out by next July]       Follow up plan: Follow up call scheduled for 02/12/23    Encounter Outcome:  Pt. Visit Completed

## 2023-02-04 NOTE — Patient Instructions (Signed)
Visit Information  Thank you for taking time to visit with me today. Please don't hesitate to contact me if I can be of assistance to you.   Following are the goals we discussed today:   Goals Addressed             This Visit's Progress    Depression/Adjustment to life changes        Activities and task to complete in order to accomplish goals.    So glad your adjusting well to the insulin pump! 78%in range Call your insurance provider for more information about your Enhanced Benefits (mental health outpatient copay?glad you have been able to change your insurance to one that covers RX,etc) Plan to see Endocrinologist as planned for initial visit now in August instead of September- seek wait list/cancellations with them for visit possibly sooner Continue to feel empowered/valuing self and speaking up as we discussed- set house responsibilities, rules? Keep enjoying your gardening as you can           Our next appointment is by telephone on 03/12/23  Please call the care guide team at [phone] if you need to cancel or reschedule your appointment.   If you are experiencing a Mental Health or Behavioral Health Crisis or need someone to talk to, please call the Suicide and Crisis Lifeline: 988 call 911   The patient verbalized understanding of instructions, educational materials, and care plan provided today and DECLINED offer to receive copy of patient instructions, educational materials, and care plan.   Telephone follow up appointment with care management team member scheduled for: 02/12/23  Reece Levy, MSW, LCSW Clinical Social Worker [facility] [phone]

## 2023-02-22 ENCOUNTER — Other Ambulatory Visit (HOSPITAL_COMMUNITY): Payer: Self-pay | Admitting: Internal Medicine

## 2023-03-02 ENCOUNTER — Encounter: Payer: 59 | Admitting: *Deleted

## 2023-03-03 ENCOUNTER — Ambulatory Visit: Payer: Self-pay | Admitting: *Deleted

## 2023-03-04 NOTE — Patient Outreach (Signed)
  Care Coordination   Follow Up Visit Note   03/04/2023 Name: Amanda Frederick MRN: [id number] DOB: [birth year]  Amanda Frederick is a 52 y.o. year old female who sees Rocky Morel, DO for primary care. I spoke with  Amanda Frederick by phone today.  What matters to the patients health and wellness today?  Feeling frustrated and upset with things at home.    Goals Addressed             This Visit's Progress    Depression/Adjustment to life changes        Activities and task to complete in order to accomplish goals.    So glad your adjusting well to the insulin pump!  Call your insurance provider for more information about your Enhanced Benefits (mental health outpatient copay?glad you have been able to change your insurance to one that covers RX,etc) Plan to see Endocrinologist as planned for initial visit now in August  Continue to feel empowered/valuing self and speaking up as we discussed- set house responsibilities, rules? Keep enjoying your gardening as you can           SDOH assessments and interventions completed:  Yes{THN Tip this will not be part of the note when signed-REQUIRED REPORT FIELD DO NOT DELETE (Optional):27901}     Care Coordination Interventions:  Yes, provided {THN Tip this will not be part of the note when signed-REQUIRED REPORT FIELD DO NOT DELETE (Optional):27901} Interventions Today    Flowsheet Row Most Recent Value  Chronic Disease   Chronic disease during today's visit Diabetes  Mental Health Interventions   Mental Health Discussed/Reviewed Mental Health Discussed, Mental Health Reviewed, Coping Strategies, Depression  [pt voices being tired, frustrated, upset with family dynamics- validated and offered support and encouragement]       Follow up plan: Follow up call scheduled for ***   Encounter Outcome:  {ENCOUTCOME:27770} {THN Tip this will not be part of the note when signed-REQUIRED REPORT FIELD DO NOT DELETE (Optional):27901}

## 2023-03-05 NOTE — Patient Instructions (Signed)
Visit Information  Thank you for taking time to visit with me today. Please don't hesitate to contact me if I can be of assistance to you.   Following are the goals we discussed today:   Goals Addressed             This Visit's Progress    Depression/Adjustment to life changes        Activities and task to complete in order to accomplish goals.    So glad your adjusting well to the insulin pump!  Call your insurance provider for more information about your Enhanced Benefits (mental health outpatient copay?glad you have been able to change your insurance to one that covers RX,etc) Plan to see Endocrinologist as planned for initial visit now in August  Continue to feel empowered/valuing self and speaking up as we discussed- set house responsibilities, rules? Keep enjoying your gardening as you can           Our next appointment is by telephone on 03/18/23    Please call the care guide team at [phone] if you need to cancel or reschedule your appointment.   If you are experiencing a Mental Health or Behavioral Health Crisis or need someone to talk to, please call the Suicide and Crisis Lifeline: 988 call 911   The patient verbalized understanding of instructions, educational materials, and care plan provided today and DECLINED offer to receive copy of patient instructions, educational materials, and care plan.   Telephone follow up appointment with care management team member scheduled for:03/18/23  Reece Levy, MSW, LCSW Clinical Social Worker [facility] [phone]

## 2023-03-06 DIAGNOSIS — E1049 Type 1 diabetes mellitus with other diabetic neurological complication: Secondary | ICD-10-CM | POA: Diagnosis not present

## 2023-03-06 DIAGNOSIS — E108 Type 1 diabetes mellitus with unspecified complications: Secondary | ICD-10-CM | POA: Diagnosis not present

## 2023-03-06 DIAGNOSIS — E109 Type 1 diabetes mellitus without complications: Secondary | ICD-10-CM | POA: Diagnosis not present

## 2023-03-06 DIAGNOSIS — Z794 Long term (current) use of insulin: Secondary | ICD-10-CM | POA: Diagnosis not present

## 2023-03-06 DIAGNOSIS — E1065 Type 1 diabetes mellitus with hyperglycemia: Secondary | ICD-10-CM | POA: Diagnosis not present

## 2023-03-11 ENCOUNTER — Ambulatory Visit: Payer: 59 | Admitting: Gastroenterology

## 2023-03-11 ENCOUNTER — Encounter: Payer: Self-pay | Admitting: Gastroenterology

## 2023-03-11 VITALS — BP 126/74 | HR 79 | Ht 66.0 in | Wt 190.0 lb

## 2023-03-11 DIAGNOSIS — I2510 Atherosclerotic heart disease of native coronary artery without angina pectoris: Secondary | ICD-10-CM | POA: Diagnosis not present

## 2023-03-11 DIAGNOSIS — K580 Irritable bowel syndrome with diarrhea: Secondary | ICD-10-CM

## 2023-03-11 DIAGNOSIS — R21 Rash and other nonspecific skin eruption: Secondary | ICD-10-CM

## 2023-03-11 DIAGNOSIS — I5022 Chronic systolic (congestive) heart failure: Secondary | ICD-10-CM

## 2023-03-11 MED ORDER — NYSTATIN 100000 UNIT/GM EX POWD: 1.0000 | Freq: Three times a day (TID) | CUTANEOUS | 0 refills | Status: AC

## 2023-03-11 MED ORDER — AMITRIPTYLINE HCL 10 MG PO TABS: 10.0000 mg | ORAL_TABLET | Freq: Every day | ORAL | 3 refills | Status: DC

## 2023-03-11 MED ORDER — CHOLESTYRAMINE 4 G PO PACK: 4.0000 g | PACK | Freq: Two times a day (BID) | ORAL | 1 refills | Status: DC | PRN

## 2023-03-11 NOTE — Progress Notes (Signed)
HPI : Amanda Frederick is a very pleasant 52 year old female with CAD s/p stent and poorly controlled Type I DM who presents for follow up of chronic diarrhea and abdominal pain. A work up for causes of her symptoms was unrevealing, which included normal inflammatory markers (CRP, ESR, fecal lactoferrin), fecal elastase and negative TTG/IgA.  Today, the patient reports that her stool frequency has improved with more regular Imodium use.  She typically takes 1 Imodium tablet every 6 hours, and then usually 2 at night.  On this regimen, she will have 4-5 stools per day, and very rare nocturnal bowel movements.  This is improved from her last visit when she was having 8-10 bowel movements per days and nocturnal bowel movements most nights. She still has some urgency, but this is improved.  Her stools remain poorly formed.  She still has significant problems with abdominal pain and bloating.  This has not improved much with the Imodium.  She continues to avoid dairy, artificial sweeteners and sugary foods.  She also reports having recurrent episodes of burning pain around the anus and gluteal cleft.  She has noted that the skin will appear very raw and "blistered".  It will take a week or so before this improves.  This is been going on for a few months now.  She states that currently she does not have any pain or discomfort back there.  She reports that her blood sugars have improved.  She received a new insulin pump May 18 and has an appointment with her new endocrinologist Friday.  She says that her blood sugars are typically in the 80s-110s in the morning.  She has not had a repeat hemoglobin A1c since January when it was 14.  She reports having an EGD/Colonoscopy in [place] by Dr. Braulio Conte maybe 10-15 years ago to evaluate bloating and abdominal pain.  She says that she did have polyps removed.     She reports problems with general anesthesia in the past ('I died three times during surgery').   This occurred during her tubal ligation in 1995, hysterectomy in 2008 and a sinus surgery sometime between the two.  She had a recent eye surgery with propofol which was uneventful.     She has a history of significant CAD diagnosed with July 2022 with 90% Lcx stenosis treated with DES.  TTE at that time showed EF 25-30%.   cMRI in Feb 2023 showed an LVEF of 43% and myocardial infarction-pattern scarring consistent with substantial LAD territory MI. Repeat cath on Sep 20, 2021 showed progression of LAD disease with diffuse diabetic CAD in mid/distal LAD, but no target for intervention.  EF 35-45%. A recent TTE (April 2024) showed an EF 35-40% with regional wall motion abnormalities and moderately decreased LV function  She reports ongoing problems with chest pain and shortness of breath.  She has chest pain on a daily basis.  She has significant exertional dyspnea, and gets short of breath and tired after walking 100 feet.   Past Medical History:  Diagnosis Date   AC (acromioclavicular) joint bone spurs    Anemia    Anxiety    Arthritis    Bronchitis    Bulging lumbar disc    Bursitis    CHF (congestive heart failure) (HCC)    Complication of anesthesia    "has died 3 separate times under anesthesia"   Depression    Diabetes (HCC)    Environmental allergies    Fibromyalgia  High blood pressure    High cholesterol    History of COVID-19 01/2020   was hospitalized   History of endometriosis    History of uterine fibroid    Migraines    Neuropathy    eyes , hands and feet    Pinched nerve    Pneumonia    Seizure (HCC)    after eye test only   Spondylosis    Tachycardia    after COVID   Tendonitis    Vision abnormalities      Past Surgical History:  Procedure Laterality Date   CORONARY STENT INTERVENTION N/A 02/19/2021   Procedure: CORONARY STENT INTERVENTION;  Surgeon: Corky Crafts, MD;  Location: [facility] INVASIVE CV LAB;  Service: Cardiovascular;  Laterality: N/A;    NASAL SINUS SURGERY     ORIF ANKLE FRACTURE Left 01/04/2021   Procedure: OPEN REDUCTION INTERNAL FIXATION (ORIF) LEFT BIMALLEOLAR ANKLE FRACTURE;  Surgeon: Kathryne Hitch, MD;  Location: [facility] OR[facility];  Service: Orthopedics;  Laterality: Left;   RIGHT/LEFT HEART CATH AND CORONARY ANGIOGRAPHY N/A 02/19/2021   Procedure: RIGHT/LEFT HEART CATH AND CORONARY ANGIOGRAPHY;  Surgeon: Corky Crafts, MD;  Location: [facility] INVASIVE CV LAB;  Service: Cardiovascular;  Laterality: N/A;   RIGHT/LEFT HEART CATH AND CORONARY ANGIOGRAPHY N/A 09/20/2021   Procedure: RIGHT/LEFT HEART CATH AND CORONARY ANGIOGRAPHY;  Surgeon: Dolores Patty, MD;  Location: [facility] INVASIVE CV LAB;  Service: Cardiovascular;  Laterality: N/A;   TUBAL LIGATION     VAGINAL HYSTERECTOMY     Family History  Problem Relation Age of Onset   Anemia Mother    High Cholesterol Mother    Diabetes Mother    Arthritis Mother    Diabetes type I Maternal Grandmother    Colon cancer Neg Hx    Esophageal cancer Neg Hx    Stomach cancer Neg Hx    Colon polyps Neg Hx    Breast cancer Neg Hx    Social History   Tobacco Use   Smoking status: Never   Smokeless tobacco: Never  Substance Use Topics   Alcohol use: Not Currently    Comment: special occassions   Drug use: No   Current Outpatient Medications  Medication Sig Dispense Refill   aspirin 81 MG chewable tablet Chew by mouth daily.     atorvastatin (LIPITOR) 80 MG tablet Take 1 tablet (80 mg total) by mouth daily. 90 tablet 3   cholecalciferol (VITAMIN D) 1000 UNITS tablet Take 1,000 Units by mouth daily.     clopidogrel (PLAVIX) 75 MG tablet TAKE 1 TABLET BY MOUTH EVERY DAY 90 tablet 3   Continuous Blood Gluc Receiver (DEXCOM G7 RECEIVER) DEVI 1 each by Does not apply route as needed. 1 each 0   Continuous Blood Gluc Sensor (DEXCOM G7 SENSOR) MISC Use to monitor blood sugar as directed 9 each 3   dicyclomine (BENTYL) 20 MG tablet TAKE 1 TABLET BY MOUTH EVERY 6 HOURS. 360  tablet 1   fluticasone (FLONASE) 50 MCG/ACT nasal spray SPRAY 1 SPRAY INTO BOTH NOSTRILS DAILY. 16 mL 2   furosemide (LASIX) 40 MG tablet TAKE 1 TABLET BY MOUTH DAILY AS NEEDED 90 tablet 2   glucose blood (BAYER CONTOUR NEXT TEST) test strip Test 8-10 times per day 8-10 lancets/day 300 each 11   insulin aspart (NOVOLOG) 100 UNIT/ML injection Inject 10 units with meals three times a day. Inject 20 units for BS > 400, 15 units for BS 300-399, 10 units for BS  200-299  and 5 units for BS 100-199 10 mL 11   insulin degludec (TRESIBA FLEXTOUCH) 100 UNIT/ML FlexTouch Pen Inject 40 Units into the skin daily. 9 mL 3   Insulin Infusion Pump (T:SLIM X2 CONTROL-IQ PUMP) DEVI 1 each by Does not apply route as needed. 1 each 0   Insulin Infusion Pump Supplies (PARADIGM QUICK-SET 32" ) MISC Use with novolog in insulin pump 40 each 3   Insulin Infusion Pump Supplies (T:SLIM X2 CARTRIDGE) MISC Use with insulin pump 30 each 4   Insulin Infusion Pump Supplies (T:SLIM X2/CONTROL-IQ/ACC/INSTR) MISC Use with insulin pump 1 each 1   loperamide (IMODIUM) 2 MG capsule Take 1 capsule (2 mg total) by mouth as needed for diarrhea or loose stools. Take 2 mg after every loose bowel movement with a maximum of 16 mg a day (maximum of 8 pills a day) 90 capsule 2   Magnesium 250 MG TABS Take 250 mg by mouth daily.     metoprolol succinate (TOPROL-XL) 50 MG 24 hr tablet TAKE 1 TABLET BY MOUTH TWICE A DAY 60 tablet 5   Multiple Vitamin (MULTIVITAMIN) capsule Take 1 capsule by mouth daily.     potassium chloride SA (KLOR-CON M) 20 MEQ tablet TAKE 1 TABLET BY MOUTH EVERY DAY 90 tablet 3   sacubitril-valsartan (ENTRESTO) 24-26 MG Take 1 tablet by mouth 2 (two) times daily. 60 tablet 8   spironolactone (ALDACTONE) 25 MG tablet TAKE 1/2 TABLET BY MOUTH DAILY 45 tablet 0   No current facility-administered medications for this visit.   Facility-Administered Medications Ordered in Other Visits  Medication Dose Route Frequency  Provider Last Rate Last Admin   sodium chloride flush (NS) 0.9 % injection 3 mL  3 mL Intravenous Q12H Bensimhon, Bevelyn Buckles, MD       Allergies  Allergen Reactions   Fentanyl Nausea And Vomiting   Other Hives    Antibiotic specific one unsure but has tolerated antibiotic recently. Reaction was about 20 years ago.   Wound Dressing Adhesive Other (See Comments)    Blisters     Review of Systems: All systems reviewed and negative except where noted in HPI.    No results found.  Physical Exam: BP 126/74 (BP Location: Left Arm, Patient Position: Sitting)   Pulse 79   Ht 5\' 6"  (1.676 m)   Wt 190 lb (86.2 kg)   SpO2 96%   BMI 30.67 kg/m  Constitutional: Pleasant,well-developed, Caucasian female in no acute distress.  Accompanied by spouse HEENT: Normocephalic and atraumatic. Conjunctivae are normal. No scleral icterus. Extremities: no edema Neurological: Alert and oriented to person place and time. Skin: Skin is warm and dry. No rashes noted.  Rectal exam deferred due to absence of symptoms at this time Psychiatric: Normal mood and affect. Behavior is normal.  CBC    Component Value Date/Time   WBC 8.5 11/05/2021 1446   RBC 5.14 (H) 11/05/2021 1446   HGB 14.3 11/05/2021 1446   HGB 15.0 09/13/2021 1409   HCT 42.9 11/05/2021 1446   HCT 45.2 09/13/2021 1409   PLT 266 11/05/2021 1446   PLT 271 09/13/2021 1409   MCV 83.5 11/05/2021 1446   MCV 85 09/13/2021 1409   MCH 27.8 11/05/2021 1446   MCHC 33.3 11/05/2021 1446   RDW 13.1 11/05/2021 1446   RDW 12.5 09/13/2021 1409   LYMPHSABS 1.2 02/15/2021 0409   MONOABS 0.7 02/15/2021 0409   EOSABS 0.0 02/15/2021 0409   BASOSABS 0.0 02/15/2021 0409    CMP  Component Value Date/Time   NA 137 10/16/2022 0907   NA 141 09/13/2021 1409   K 4.5 10/16/2022 0907   CL 104 10/16/2022 0907   CO2 26 10/16/2022 0907   GLUCOSE 320 (H) 10/16/2022 0907   BUN 13 10/16/2022 0907   BUN 28 (H) 09/13/2021 1409   CREATININE 0.92 10/16/2022  0907   CALCIUM 9.1 10/16/2022 0907   PROT 6.9 06/16/2022 0954   ALBUMIN 4.2 06/16/2022 0954   AST 24 06/16/2022 0954   ALT 26 06/16/2022 0954   ALKPHOS 111 06/16/2022 0954   BILITOT 1.5 (H) 06/16/2022 0954   GFRNONAA >60 10/16/2022 0907   GFRAA >60 03/07/2020 0130       Latest Ref Rng & Units 11/05/2021    2:46 PM 09/20/2021    9:32 AM 09/20/2021    9:27 AM  CBC EXTENDED  WBC 4.0 - 10.5 K/uL 8.5     RBC 3.87 - 5.11 MIL/uL 5.14     Hemoglobin 12.0 - 15.0 g/dL 30.8  65.7  84.6    96.2   HCT 36.0 - 46.0 % 42.9  34.0  31.0    38.0   Platelets 150 - 400 K/uL 266         ASSESSMENT AND PLAN: 52 year old female with coronary artery disease status post stenting, with residual small vessel disease not amenable to intervention and systolic dysfunction and poorly controlled diabetes with chronic abdominal pain and diarrhea without red flag symptoms.  Given the chronicity of her symptoms and negative workup, I think that her abdominal pain and diarrhea are most consistent with IBS. The Imodium has helped reduce her stool frequency, but she continues to have significant pain issues.  I think she would benefit from adding Elavil.  This may also help with her diarrhea.  She does have a history of depression and previously was taking Zoloft, but this is many years ago.  She does not think she has ever been on a TCA.  We discussed potential side effects of TCAs to include constipation, dry mouth, somnolence and urinary retention.  Will start at a low-dose, 10 mg at night and plan to increase to 20 mg after 2 weeks. I also recommended she try taking cholestyramine to see if this is more effective for her diarrhea, and may reduce the number of Imodium tablets.  Recommended she take it once daily to start, and increase up to twice a day.  She was informed that cholestyramine can interfere with absorption of other medications and that she would need to take her other meds at least 1 hour before taking  cholestyramine. If the Elavil does not result in improvement in her symptoms, we can try for rifaximin or Viberzi.  The patient's description of the perianal skin irritation seems most consistent with a fungal infection.  She is at risk for this given her poorly controlled diabetes.  Although she is asymptomatic at this time, I will prescribe nystatin powder to be used if her symptoms recur.  Will continue to defer elective endoscopic procedures at this time, given the risks of sedation based on her coronary artery disease, depressed EF and poor functional status.  IBS-D - Start Elavil 10 mg at bedtime, increase to 20 mg after 2 weeks - Start Cholestyramine -Continue loperamide, hopefully will wean off if cholestyramine is effective - Try Rifaximin if no improvement -Follow-up 8 weeks  Perianal rash - Suspect fungal, will given nystatin powder for next episode   E. Tomasa Rand, MD [facility]  I spent a total of 45 minutes reviewing the patient's medical record, interviewing and examining the patient, discussing her diagnosis and management of her condition going forward, and documenting in the medical record    Rocky Morel, DO

## 2023-03-11 NOTE — Patient Instructions (Addendum)
_______________________________________________________  If your blood pressure at your visit was 140/90 or greater, please contact your primary care physician to follow up on this.  We have sent the following medications to your pharmacy for you to pick up at your convenience: Elavil 10 mg every night for 2 weeks then increase to 2 tablets. Cholestyramine 4 grams , nystatin powder -apply to affected area twice daily.    If you are age 53 or older, your body mass index should be between 23-30. Your Body mass index is 30.67 kg/m. If this is out of the aforementioned range listed, please consider follow up with your Primary Care Provider.  If you are age 2 or younger, your body mass index should be between 19-25. Your Body mass index is 30.67 kg/m. If this is out of the aformentioned range listed, please consider follow up with your Primary Care Provider.   ________________________________________________________  The [facility] providers would like to encourage you to use [facility] to communicate with providers for non-urgent requests or questions.  Due to long hold times on the telephone, sending your provider a message by [facility] may be a faster and more efficient way to get a response.  Please allow 48 business hours for a response.  Please remember that this is for non-urgent requests.   It was a pleasure to see you today!  Thank you for trusting me with your gastrointestinal care!     Scott E.Tomasa Rand, MD

## 2023-03-13 DIAGNOSIS — E559 Vitamin D deficiency, unspecified: Secondary | ICD-10-CM | POA: Diagnosis not present

## 2023-03-13 DIAGNOSIS — E538 Deficiency of other specified B group vitamins: Secondary | ICD-10-CM | POA: Diagnosis not present

## 2023-03-13 DIAGNOSIS — I509 Heart failure, unspecified: Secondary | ICD-10-CM | POA: Diagnosis not present

## 2023-03-13 DIAGNOSIS — E1042 Type 1 diabetes mellitus with diabetic polyneuropathy: Secondary | ICD-10-CM | POA: Diagnosis not present

## 2023-03-13 DIAGNOSIS — I2510 Atherosclerotic heart disease of native coronary artery without angina pectoris: Secondary | ICD-10-CM | POA: Diagnosis not present

## 2023-03-13 DIAGNOSIS — E782 Mixed hyperlipidemia: Secondary | ICD-10-CM | POA: Diagnosis not present

## 2023-03-13 DIAGNOSIS — R635 Abnormal weight gain: Secondary | ICD-10-CM | POA: Diagnosis not present

## 2023-03-13 DIAGNOSIS — D751 Secondary polycythemia: Secondary | ICD-10-CM | POA: Diagnosis not present

## 2023-03-13 DIAGNOSIS — E1065 Type 1 diabetes mellitus with hyperglycemia: Secondary | ICD-10-CM | POA: Diagnosis not present

## 2023-03-13 DIAGNOSIS — I10 Essential (primary) hypertension: Secondary | ICD-10-CM | POA: Diagnosis not present

## 2023-03-18 ENCOUNTER — Ambulatory Visit: Payer: Self-pay | Admitting: *Deleted

## 2023-03-18 NOTE — Patient Instructions (Signed)
Visit Information  Thank you for taking time to visit with me today. Please don't hesitate to contact me if I can be of assistance to you.   Following are the goals we discussed today:   Goals Addressed             This Visit's Progress    Depression/Adjustment to life changes        Activities and task to complete in order to accomplish goals.    Call your insurance provider for more information about your Enhanced Benefits (mental health outpatient copay?glad you have been able to change your insurance to one that covers RX,etc) New Endocrinologist initial visit went well with 3 month follow up planned  Continue to feel empowered/valuing self and speaking up as we discussed- set house expectations, bundaries and responsibilities, rules? Keep enjoying your gardening as you can              Our next appointment is by telephone on 03/31/23    Please call the care guide team at [phone] if you need to cancel or reschedule your appointment.   If you are experiencing a Mental Health or Behavioral Health Crisis or need someone to talk to, please call the Suicide and Crisis Lifeline: 988 call 911   The patient verbalized understanding of instructions, educational materials, and care plan provided today and DECLINED offer to receive copy of patient instructions, educational materials, and care plan.   Telephone follow up appointment with care management team member scheduled for: 03/31/23  Reece Levy, MSW, LCSW Clinical Social Worker  [phone]

## 2023-03-18 NOTE — Patient Outreach (Signed)
  Care Coordination   Follow Up Visit Note   03/18/2023 Name: Amanda Frederick MRN: [id number] DOB: 1971/06/07  Amanda Frederick is a 52 y.o. year old female who sees Rocky Morel, DO for primary care. I spoke with  Lesli Albee by phone today.  What matters to the patients health and wellness today?  Feeling pretty good- still having some issues with family/co-habitating    Goals Addressed             This Visit's Progress    Depression/Adjustment to life changes        Activities and task to complete in order to accomplish goals.    Call your insurance provider for more information about your Enhanced Benefits (mental health outpatient copay?glad you have been able to change your insurance to one that covers RX,etc) New Endocrinologist initial visit went well with 3 month follow up planned  Continue to feel empowered/valuing self and speaking up as we discussed- set house expectations, bundaries and responsibilities, rules? Keep enjoying your gardening as you can              SDOH assessments and interventions completed:  Yes     Care Coordination Interventions:  Yes, provided  Interventions Today    Flowsheet Row Most Recent Value  Chronic Disease   Chronic disease during today's visit Diabetes  [Saw new Dr Roanna Raider, Endocrinologist- no changes yet. labwork pending]  General Interventions   General Interventions Discussed/Reviewed General Interventions Discussed, [facility], Doctor Visits  [Pt pleased with new Endocrinologist]  Doctor Visits Discussed/Reviewed Doctor Visits Discussed, Specialist  Mental Health Interventions   Mental Health Discussed/Reviewed Mental Health Discussed, Coping Strategies  [CSW validated pt's frustrations and family struggles,  offering suggestions and encouragement to be empowered to make house rules/expectations to enforce with her daughter, boyfriend of daughter and gkids.]       Follow up plan: Follow up call scheduled for 03/31/23     Encounter Outcome:  Pt. Visit Completed

## 2023-03-31 ENCOUNTER — Ambulatory Visit: Payer: Self-pay | Admitting: *Deleted

## 2023-03-31 NOTE — Patient Outreach (Signed)
  Care Coordination   Follow Up Visit Note   04/01/2023 Name: Amanda Frederick MRN: [id number] DOB: [birth year]  Amanda Frederick is a 52 y.o. year old female who sees Rocky Morel, DO for primary care. I spoke with  Lesli Albee by phone today.  What matters to the patients health and wellness today?  Family has begun to some small tasks/chores to help around the house.    Goals Addressed             This Visit's Progress    Depression/Adjustment to life changes        Activities and task to complete in order to accomplish goals.    Call your insurance provider for more information about your Enhanced Benefits (mental health outpatient copay?glad you have been able to change your insurance to one that covers RX,etc) New Endocrinologist initial visit went well with 3 month follow up planned  Continue to feel empowered/valuing self and speaking up as we discussed- set house expectations, bundaries and responsibilities, rules Keep enjoying your gardening as you can              SDOH assessments and interventions completed:  Yes  SDOH Interventions Today    Flowsheet Row Most Recent Value  SDOH Interventions   Depression Interventions/Treatment  Currently on Treatment          03/31/2023    1:22 PM 12/12/2022    2:45 PM 12/12/2022    2:39 PM 11/10/2022    2:45 PM 10/14/2022   10:34 AM  Depression screen PHQ 2/9  Decreased Interest 0 1 1 1 2   Down, Depressed, Hopeless 0 0 0 1 1  PHQ - 2 Score 0 1 1 2 3   Altered sleeping 1  0 3 1  Tired, decreased energy 3  2 1 3   Change in appetite 0  0 1 3  Feeling bad or failure about yourself  0  0 1 2  Trouble concentrating 1  1 0 2  Moving slowly or fidgety/restless 0  0 0 0  Suicidal thoughts 0  0  0  PHQ-9 Score 5  4 8 14   Difficult doing work/chores  Not difficult at all   Very difficult    Care Coordination Interventions:  Yes, provided  Interventions Today    Flowsheet Row Most Recent Value  Chronic Disease   Chronic  disease during today's visit Diabetes  [Pt reports her A1C has improved from 14 to 7]  General Interventions   General Interventions Discussed/Reviewed [facility], General Interventions Discussed  Mental Health Interventions   Mental Health Discussed/Reviewed Mental Health Discussed, Mental Health Reviewed, Depression  [CSW completed depression screening with pt today- pt scoring "5" and down from "14" in March showing significant improvement in depression symptoms. Pt is encouraged to find ways to verbalize with family the needs for help with home chores,etc.]       Follow up plan: Follow up call scheduled for 04/30/23    Encounter Outcome:  Patient Visit Completed

## 2023-04-01 NOTE — Patient Instructions (Signed)
Visit Information  Thank you for taking time to visit with me today. Please don't hesitate to contact me if I can be of assistance to you.   Following are the goals we discussed today:   Goals Addressed             This Visit's Progress    Depression/Adjustment to life changes        Activities and task to complete in order to accomplish goals.    Call your insurance provider for more information about your Enhanced Benefits (mental health outpatient copay?glad you have been able to change your insurance to one that covers RX,etc) New Endocrinologist initial visit went well with 3 month follow up planned  Continue to feel empowered/valuing self and speaking up as we discussed- set house expectations, bundaries and responsibilities, rules Keep enjoying your gardening as you can              Our next appointment is by telephone on 04/30/23  Please call the care guide team at [phone] if you need to cancel or reschedule your appointment.   If you are experiencing a Mental Health or Behavioral Health Crisis or need someone to talk to, please call the Suicide and Crisis Lifeline: 988 call 911   The patient verbalized understanding of instructions, educational materials, and care plan provided today and DECLINED offer to receive copy of patient instructions, educational materials, and care plan.   Telephone follow up appointment with care management team member scheduled for:04/30/23  Reece Levy, MSW, LCSW Clinical Social Worker [phone]

## 2023-04-02 ENCOUNTER — Other Ambulatory Visit: Payer: Self-pay | Admitting: Gastroenterology

## 2023-04-05 ENCOUNTER — Other Ambulatory Visit: Payer: Self-pay | Admitting: Student

## 2023-04-20 ENCOUNTER — Other Ambulatory Visit (HOSPITAL_COMMUNITY): Payer: Self-pay | Admitting: Internal Medicine

## 2023-04-30 ENCOUNTER — Ambulatory Visit: Payer: Self-pay | Admitting: *Deleted

## 2023-04-30 NOTE — Patient Outreach (Signed)
Care Coordination   Follow Up Visit Note   04/30/2023 Name: Amanda Frederick MRN: [id number] DOB: [birth year]  Amanda Frederick is a 52 y.o. year old female who sees Rocky Morel, DO for primary care. I spoke with  Lesli Albee by phone today.  What matters to the patients health and wellness today?  We had a surprise with 5 baby pigs born that I am having to raise.    Goals Addressed             This Visit's Progress    Depression/Adjustment to life changes        Activities and task to complete in order to accomplish goals.    Call your insurance provider for more information about your Enhanced Benefits (mental health outpatient copay?glad you have been able to change your insurance to one that covers RX,etc) New Endocrinologist initial visit went well with 3 month follow up planned  Continue to feel empowered/valuing self and speaking up as we discussed- set house expectations, bundaries and responsibilities, rules with consequences that you support Keep enjoying your gardening and pig raising !             SDOH assessments and interventions completed:  Yes     Care Coordination Interventions:  Yes, provided  Interventions Today    Flowsheet Row Most Recent Value  Mental Health Interventions   Mental Health Discussed/Reviewed --  [Pt's son has moved onto their property temporarily while seeking home to buy- pt feels his presence and actions with his sister are providing a positive model as well as some assistance with changes at home to help pt]       Follow up plan: Follow up call scheduled for 10/29    Encounter Outcome:  Patient Visit Completed

## 2023-04-30 NOTE — Patient Instructions (Signed)
Visit Information  Thank you for taking time to visit with me today. Please don't hesitate to contact me if I can be of assistance to you.   Following are the goals we discussed today:   Goals Addressed             This Visit's Progress    Depression/Adjustment to life changes        Activities and task to complete in order to accomplish goals.    Call your insurance provider for more information about your Enhanced Benefits (mental health outpatient copay?glad you have been able to change your insurance to one that covers RX,etc) New Endocrinologist initial visit went well with 3 month follow up planned  Continue to feel empowered/valuing self and speaking up as we discussed- set house expectations, bundaries and responsibilities, rules with consequences that you support Keep enjoying your gardening and pig raising !             Our next appointment is by telephone on 05/26/23   Please call the care guide team at [phone] if you need to cancel or reschedule your appointment.   If you are experiencing a Mental Health or Behavioral Health Crisis or need someone to talk to, please call the Suicide and Crisis Lifeline: 988 call 911   The patient verbalized understanding of instructions, educational materials, and care plan provided today and DECLINED offer to receive copy of patient instructions, educational materials, and care plan.   Telephone follow up appointment with care management team member scheduled for:05/26/23  Reece Levy, MSW, LCSW Clinical Social Worker [phone]

## 2023-05-20 ENCOUNTER — Other Ambulatory Visit: Payer: Self-pay | Admitting: Gastroenterology

## 2023-05-20 ENCOUNTER — Other Ambulatory Visit: Payer: Self-pay | Admitting: Internal Medicine

## 2023-05-26 ENCOUNTER — Ambulatory Visit: Payer: Self-pay | Admitting: *Deleted

## 2023-05-26 NOTE — Patient Outreach (Signed)
Care Coordination   Follow Up Visit Note   05/26/2023 Name: Lynora Twombly MRN: [id number] DOB: [birth year]  Vernadette Sehnert is a 52 y.o. year old female who sees Rocky Morel, DO for primary care. I spoke with  Lesli Albee by phone today.  What matters to the patients health and wellness today?  Pt reports currently having a bad cold.    Goals Addressed             This Visit's Progress    Depression/Adjustment to life changes        Activities and task to complete in order to accomplish goals.    Consider counseling support as discussed- call your insurance provider for more information about your Enhanced Benefits (mental health outpatient copay?glad you have been able to change your insurance to one that covers RX,etc) New Endocrinologist initial visit went well with 3 month follow up planned  Continue to feel empowered/valuing self and speaking up as we discussed- set house expectations, bundaries and responsibilities, rules with consequences that you support Keep enjoying your gardening and pig raising !    Reach out to your PCP for scheduling          SDOH assessments and interventions completed:  Yes     Care Coordination Interventions:  Yes, provided  Interventions Today    Flowsheet Row Most Recent Value  General Interventions   General Interventions Discussed/Reviewed Doctor Visits  Doctor Visits Discussed/Reviewed PCP  [Pt thought she had been scheduled but nothing in [facility]- suggested she call and request appointment]  Mental Health Interventions   Mental Health Discussed/Reviewed Mental Health Discussed, Mental Health Reviewed, Coping Strategies  [irritable/frustrated with house tasks that are "not taken care of and are left to me to do?]       Follow up plan: Follow up call scheduled for 06/19/23    Encounter Outcome:  Patient Visit Completed

## 2023-06-02 ENCOUNTER — Other Ambulatory Visit (HOSPITAL_COMMUNITY): Payer: Self-pay | Admitting: Family Medicine

## 2023-06-05 DIAGNOSIS — E109 Type 1 diabetes mellitus without complications: Secondary | ICD-10-CM | POA: Diagnosis not present

## 2023-06-05 DIAGNOSIS — E1049 Type 1 diabetes mellitus with other diabetic neurological complication: Secondary | ICD-10-CM | POA: Diagnosis not present

## 2023-06-05 DIAGNOSIS — E1065 Type 1 diabetes mellitus with hyperglycemia: Secondary | ICD-10-CM | POA: Diagnosis not present

## 2023-06-05 DIAGNOSIS — Z794 Long term (current) use of insulin: Secondary | ICD-10-CM | POA: Diagnosis not present

## 2023-06-05 DIAGNOSIS — E108 Type 1 diabetes mellitus with unspecified complications: Secondary | ICD-10-CM | POA: Diagnosis not present

## 2023-06-10 ENCOUNTER — Encounter: Payer: Self-pay | Admitting: Student

## 2023-06-10 ENCOUNTER — Ambulatory Visit (HOSPITAL_COMMUNITY)
Admission: RE | Admit: 2023-06-10 | Discharge: 2023-06-10 | Disposition: A | Discharge status: Home / Self Care | Payer: 59 | Source: Ambulatory Visit | Attending: Internal Medicine | Admitting: Internal Medicine

## 2023-06-10 ENCOUNTER — Ambulatory Visit (INDEPENDENT_AMBULATORY_CARE_PROVIDER_SITE_OTHER): Payer: 59 | Admitting: Student

## 2023-06-10 ENCOUNTER — Encounter (HOSPITAL_COMMUNITY): Payer: Self-pay

## 2023-06-10 ENCOUNTER — Ambulatory Visit (HOSPITAL_COMMUNITY)
Admission: RE | Admit: 2023-06-10 | Discharge: 2023-06-10 | Disposition: A | Discharge status: Home / Self Care | Payer: 59 | Source: Ambulatory Visit | Attending: Family Medicine | Admitting: Family Medicine

## 2023-06-10 ENCOUNTER — Other Ambulatory Visit (HOSPITAL_COMMUNITY): Payer: Self-pay | Admitting: Internal Medicine

## 2023-06-10 VITALS — BP 133/76 | HR 98 | Temp 98.0°F | Ht 66.0 in | Wt 201.4 lb

## 2023-06-10 VITALS — BP 138/76 | HR 93 | Wt 202.2 lb

## 2023-06-10 DIAGNOSIS — I471 Supraventricular tachycardia, unspecified: Secondary | ICD-10-CM | POA: Diagnosis not present

## 2023-06-10 DIAGNOSIS — Z79899 Other long term (current) drug therapy: Secondary | ICD-10-CM | POA: Insufficient documentation

## 2023-06-10 DIAGNOSIS — Z8616 Personal history of COVID-19: Secondary | ICD-10-CM | POA: Diagnosis not present

## 2023-06-10 DIAGNOSIS — E7800 Pure hypercholesterolemia, unspecified: Secondary | ICD-10-CM

## 2023-06-10 DIAGNOSIS — M542 Cervicalgia: Secondary | ICD-10-CM

## 2023-06-10 DIAGNOSIS — E109 Type 1 diabetes mellitus without complications: Secondary | ICD-10-CM

## 2023-06-10 DIAGNOSIS — I252 Old myocardial infarction: Secondary | ICD-10-CM | POA: Insufficient documentation

## 2023-06-10 DIAGNOSIS — I2510 Atherosclerotic heart disease of native coronary artery without angina pectoris: Secondary | ICD-10-CM | POA: Insufficient documentation

## 2023-06-10 DIAGNOSIS — Z955 Presence of coronary angioplasty implant and graft: Secondary | ICD-10-CM | POA: Diagnosis not present

## 2023-06-10 DIAGNOSIS — M797 Fibromyalgia: Secondary | ICD-10-CM | POA: Diagnosis not present

## 2023-06-10 DIAGNOSIS — R002 Palpitations: Secondary | ICD-10-CM | POA: Insufficient documentation

## 2023-06-10 DIAGNOSIS — R0683 Snoring: Secondary | ICD-10-CM | POA: Insufficient documentation

## 2023-06-10 DIAGNOSIS — I5022 Chronic systolic (congestive) heart failure: Secondary | ICD-10-CM | POA: Diagnosis not present

## 2023-06-10 DIAGNOSIS — R0602 Shortness of breath: Secondary | ICD-10-CM | POA: Insufficient documentation

## 2023-06-10 DIAGNOSIS — K529 Noninfective gastroenteritis and colitis, unspecified: Secondary | ICD-10-CM | POA: Insufficient documentation

## 2023-06-10 DIAGNOSIS — Z794 Long term (current) use of insulin: Secondary | ICD-10-CM | POA: Insufficient documentation

## 2023-06-10 DIAGNOSIS — I493 Ventricular premature depolarization: Secondary | ICD-10-CM

## 2023-06-10 DIAGNOSIS — E1040 Type 1 diabetes mellitus with diabetic neuropathy, unspecified: Secondary | ICD-10-CM | POA: Insufficient documentation

## 2023-06-10 DIAGNOSIS — E785 Hyperlipidemia, unspecified: Secondary | ICD-10-CM | POA: Diagnosis not present

## 2023-06-10 DIAGNOSIS — M7989 Other specified soft tissue disorders: Secondary | ICD-10-CM | POA: Diagnosis not present

## 2023-06-10 DIAGNOSIS — R197 Diarrhea, unspecified: Secondary | ICD-10-CM

## 2023-06-10 DIAGNOSIS — I110 Hypertensive heart disease with heart failure: Secondary | ICD-10-CM | POA: Insufficient documentation

## 2023-06-10 DIAGNOSIS — Z23 Encounter for immunization: Secondary | ICD-10-CM

## 2023-06-10 DIAGNOSIS — Z7902 Long term (current) use of antithrombotics/antiplatelets: Secondary | ICD-10-CM | POA: Diagnosis not present

## 2023-06-10 DIAGNOSIS — J0100 Acute maxillary sinusitis, unspecified: Secondary | ICD-10-CM | POA: Diagnosis not present

## 2023-06-10 DIAGNOSIS — E1059 Type 1 diabetes mellitus with other circulatory complications: Secondary | ICD-10-CM | POA: Diagnosis not present

## 2023-06-10 DIAGNOSIS — I472 Ventricular tachycardia, unspecified: Secondary | ICD-10-CM | POA: Insufficient documentation

## 2023-06-10 DIAGNOSIS — R42 Dizziness and giddiness: Secondary | ICD-10-CM | POA: Diagnosis not present

## 2023-06-10 LAB — GLUCOSE, CAPILLARY: Glucose-Capillary: 162 mg/dL — ABNORMAL HIGH (ref 70–99)

## 2023-06-10 LAB — COMPREHENSIVE METABOLIC PANEL
ALT: 17 U/L (ref 0–44)
AST: 18 U/L (ref 15–41)
Albumin: 3.4 g/dL — ABNORMAL LOW (ref 3.5–5.0)
Alkaline Phosphatase: 93 U/L (ref 38–126)
Anion gap: 9 (ref 5–15)
BUN: 22 mg/dL — ABNORMAL HIGH (ref 6–20)
CO2: 21 mmol/L — ABNORMAL LOW (ref 22–32)
Calcium: 9.3 mg/dL (ref 8.9–10.3)
Chloride: 106 mmol/L (ref 98–111)
Creatinine, Ser: 0.98 mg/dL (ref 0.44–1.00)
GFR, Estimated: >60 mL/min (ref >60)
Glucose, Bld: 115 mg/dL — ABNORMAL HIGH (ref 70–99)
Potassium: 3.6 mmol/L (ref 3.5–5.1)
Sodium: 136 mmol/L (ref 135–145)
Total Bilirubin: 0.3 mg/dL (ref <1.2)
Total Protein: 6.4 g/dL — ABNORMAL LOW (ref 6.5–8.1)

## 2023-06-10 LAB — TSH: TSH: 2.401 u[IU]/mL (ref 0.350–4.500)

## 2023-06-10 LAB — LIPID PANEL
Cholesterol: 136 mg/dL (ref 0–200)
HDL: 45 mg/dL (ref >40)
LDL Cholesterol: 59 mg/dL (ref 0–99)
Total CHOL/HDL Ratio: 3 {ratio}
Triglycerides: 161 mg/dL — ABNORMAL HIGH (ref <150)
VLDL: 32 mg/dL (ref 0–40)

## 2023-06-10 LAB — BRAIN NATRIURETIC PEPTIDE: B Natriuretic Peptide: 54.5 pg/mL (ref 0.0–100.0)

## 2023-06-10 LAB — POCT GLYCOSYLATED HEMOGLOBIN (HGB A1C): Hemoglobin A1C: 7 % — AB (ref 4.0–5.6)

## 2023-06-10 MED ORDER — PREGABALIN 75 MG PO CAPS
75.0000 mg | ORAL_CAPSULE | Freq: Two times a day (BID) | ORAL | 3 refills | Status: DC
Start: 2023-06-10 — End: 2023-10-22

## 2023-06-10 NOTE — Progress Notes (Signed)
Advanced [facility] Failure Clinic Note    PCP: Rocky Morel, DO Primary Cardiologist: Parke Poisson, MD  HF Cardiologist: Dr. Gala Romney  HPI: Amanda Frederick is a 52 y.o. female with history of DM1, HTN, HLD, hx fibromyalgia, neuropathy, hx COVID-19 infection 02/2020. No cardiac history until recently.   Reports gradually progressing dyspnea with exertion for a couple of years. Felt horrible after COVID and dyspnea never really improved. Underwent ORIF for ankle fracture in June 2022. Initially did okay post-op. Subsequently developed LE edema, dyspnea and orthopnea. She was later admitted July 2022 with acute systolic CHF.   Echo 7/22: EF 25-30%, RV ok, large left pleural effusion, mild MR. Diuresed with IV lasix. Underwent thoracentesis for L pleural effusion.  R/LHC 7/22:  50% OM1, 50% OM2, 50% D2, 50% mid to distal LAD, 90% mid Lcx treated with PCI/DES RA 6 PA 34/15 (23) PCWP 9 mmHg Fick CO 5.02 Fick CI 2.54 PA sat 75%  Echo 11/22: EF 30-35%, septal and peri-apical severe hypokinesis, RV okay, mild MR  Seen for initial HF clinic visit 08/07/21. Noted ongoing dyspnea with exertion. Volume looked okay. ReDS 28%. Started on spiro.   Echo 09/16/21: EF 35-40%, RV okay, mild MR  cMRI 09/16/21: LVEF 43%, RVEF 49%, delayed enhancement showing myocardial infarction-pattern scarring consistent with substantial LAD territory MI.  Repeat cath 09/20/21 showed progression of LAD disease. Now with diffuse diabetic CAD in mid to distal LAD corresponding to infarct seen on cMRI. Reviewed with IC team. No target for intervention particularly given lack of viability on MRI. EF 35-45%   Ao = 133/79 (104) LV = 124/15 RA =  1 RV = 30/6 PA = 29/8 (17) PCW = 10 Fick cardiac output/index = 6.1/3.1 PVR = 0.9 WU Ao sat = 99% PA sat = 81%, 82% High SVC = 79%   Follow up 7/23, stable NYHA III and volume ok. Entresto increased to 49/51. Discussed enrolling in virtual CR.  Follow up 11/23, stable  NYHA III and volume ok. CPX arranged as symptoms out of proportion to exam findings.   Echo 4/24 showed 35-40%, RV ok.  Today she returns for HF follow up with her husband. Overall feeling fine. Tending to farm animals, but has SOB walking on flat ground, recovers with rest. She has bendopnea & dizziness, has history of vertigo. Continues with CP, occurs with and without exertion. Pain is quick, sharp and squeezing; resolves spontaneously. She feels abdomen and legs swelling. Denies abnormal bleeding or PND/Orthopnea. Appetite ok. No fever or chills. Weight at home 199 pounds. Taking all medications, now taking Lasix daily. Now sees endocrinology, last A1c 7.4 (8/24). She snores.  Review of Systems:  Cardiac and Respiratory - Negative except as mentioned in HPI.  Past Medical History:  Diagnosis Date   AC (acromioclavicular) joint bone spurs    Anemia    Anxiety    Arthritis    Bronchitis    Bulging lumbar disc    Bursitis    CHF (congestive heart failure) (HCC)    Complication of anesthesia    "has died 3 separate times under anesthesia"   Depression    Diabetes (HCC)    Environmental allergies    Fibromyalgia    High blood pressure    High cholesterol    History of COVID-19 01/2020   was hospitalized   History of endometriosis    History of uterine fibroid    Migraines    Neuropathy    eyes , hands and feet  Pinched nerve    Pneumonia    Seizure (HCC)    after eye test only   Spondylosis    Tachycardia    after COVID   Tendonitis    Vision abnormalities    Current Outpatient Medications  Medication Sig Dispense Refill   amitriptyline (ELAVIL) 10 MG tablet Take 1 tablet (10 mg total) by mouth at bedtime. 90 tablet 3   aspirin 81 MG chewable tablet Chew by mouth daily.     atorvastatin (LIPITOR) 80 MG tablet Take 1 tablet (80 mg total) by mouth daily. 90 tablet 3   cholecalciferol (VITAMIN D) 1000 UNITS tablet Take 1,000 Units by mouth daily.     cholestyramine  (QUESTRAN) 4 g packet MIX AND TAKE 1 PACKET (4 G TOTAL) BY MOUTH EVERY 12 (TWELVE) HOURS AS NEEDED. 180 packet 1   clopidogrel (PLAVIX) 75 MG tablet TAKE 1 TABLET BY MOUTH EVERY DAY 90 tablet 0   Continuous Blood Gluc Receiver (DEXCOM G7 RECEIVER) DEVI 1 each by Does not apply route as needed. 1 each 0   Continuous Blood Gluc Sensor (DEXCOM G7 SENSOR) MISC Use to monitor blood sugar as directed 9 each 3   dicyclomine (BENTYL) 20 MG tablet TAKE 1 TABLET BY MOUTH EVERY 6 HOURS 120 tablet 5   ENTRESTO 24-26 MG TAKE 1 TABLET BY MOUTH TWICE A DAY 60 tablet 8   fluticasone (FLONASE) 50 MCG/ACT nasal spray INSTILL 1 SPRAY INTO BOTH NOSTRILS DAILY 16 mL 2   furosemide (LASIX) 40 MG tablet TAKE 1 TABLET BY MOUTH DAILY AS NEEDED 90 tablet 2   glucose blood (BAYER CONTOUR NEXT TEST) test strip Test 8-10 times per day 8-10 lancets/day 300 each 11   insulin aspart (NOVOLOG) 100 UNIT/ML injection Inject 10 units with meals three times a day. Inject 20 units for BS > 400, 15 units for BS 300-399, 10 units for BS  200-299 and 5 units for BS 100-199 10 mL 11   Insulin Infusion Pump (T:SLIM X2 CONTROL-IQ PUMP) DEVI 1 each by Does not apply route as needed. 1 each 0   Insulin Infusion Pump Supplies (PARADIGM QUICK-SET 32" ) MISC Use with novolog in insulin pump 40 each 3   Insulin Infusion Pump Supplies (T:SLIM X2 CARTRIDGE) MISC Use with insulin pump 30 each 4   Insulin Infusion Pump Supplies (T:SLIM X2/CONTROL-IQ/ACC/INSTR) MISC Use with insulin pump 1 each 1   loperamide (IMODIUM) 2 MG capsule Take 1 capsule (2 mg total) by mouth as needed for diarrhea or loose stools. Take 2 mg after every loose bowel movement with a maximum of 16 mg a day (maximum of 8 pills a day) 90 capsule 2   Magnesium 250 MG TABS Take 250 mg by mouth daily.     metoprolol succinate (TOPROL-XL) 50 MG 24 hr tablet TAKE 1 TABLET BY MOUTH TWICE A DAY 60 tablet 5   Multiple Vitamin (MULTIVITAMIN) capsule Take 1 capsule by mouth daily.      potassium chloride SA (KLOR-CON M) 20 MEQ tablet TAKE 1 TABLET BY MOUTH EVERY DAY 90 tablet 3   spironolactone (ALDACTONE) 25 MG tablet Take 0.5 tablets (12.5 mg total) by mouth daily. NEEDS FOLLOW UP APPOINTMENT FOR ANYMORE REFILLS 45 tablet 0   No current facility-administered medications for this encounter.   Facility-Administered Medications Ordered in Other Encounters  Medication Dose Route Frequency Provider Last Rate Last Admin   sodium chloride flush (NS) 0.9 % injection 3 mL  3 mL Intravenous Q12H Bensimhon,  Bevelyn Buckles, MD       Allergies  Allergen Reactions   Fentanyl Nausea And Vomiting   Other Hives    Antibiotic specific one unsure but has tolerated antibiotic recently. Reaction was about 20 years ago.   Wound Dressing Adhesive Other (See Comments)    Blisters   Social History   Socioeconomic History   Marital status: Married    Spouse name: Leory Plowman   Number of children: 3   Years of education: college   Highest education level: Not on file  Occupational History    Comment: Home maker  Tobacco Use   Smoking status: Never   Smokeless tobacco: Never  Vaping Use   Vaping status: Not on file  Substance and Sexual Activity   Alcohol use: Not Currently    Comment: special occassions   Drug use: No   Sexual activity: Not on file  Other Topics Concern   Not on file  Social History Narrative   Patient lives at home with her husband Leory Plowman. Worked previously in optometrist office, left work due to personnel issues. Patient has college education. Patient drinks 4-6 caffeine drinks daily.Right handed.   Social Determinants of Health   Financial Resource Strain: Low Risk  (07/29/2022)   Overall Financial Resource Strain (CARDIA)    Difficulty of Paying Living Expenses: Not hard at all  Food Insecurity: No Food Insecurity (07/29/2022)   Hunger Vital Sign    Worried About Running Out of Food in the Last Year: Never true    Ran Out of Food in the Last Year: Never true   Transportation Needs: No Transportation Needs (07/29/2022)   PRAPARE - Administrator, Civil Service (Medical): No    Lack of Transportation (Non-Medical): No  Physical Activity: Not on file  Stress: Stress Concern Present (07/29/2022)   [employer] of Occupational Health - Occupational Stress Questionnaire    Feeling of Stress : Very much  Social Connections: Not on file  Intimate Partner Violence: Not At Risk (07/29/2022)   Humiliation, Afraid, Rape, and Kick questionnaire    Fear of Current or Ex-Partner: No    Emotionally Abused: No    Physically Abused: No    Sexually Abused: No   Family History  Problem Relation Age of Onset   Anemia Mother    High Cholesterol Mother    Diabetes Mother    Arthritis Mother    Diabetes type I Maternal Grandmother    Colon cancer Neg Hx    Esophageal cancer Neg Hx    Stomach cancer Neg Hx    Colon polyps Neg Hx    Breast cancer Neg Hx    BP 138/76   Pulse 93   Wt 91.7 kg (202 lb 3.2 oz)   SpO2 97%   BMI 32.64 kg/m   Wt Readings from Last 3 Encounters:  06/10/23 91.7 kg (202 lb 3.2 oz)  03/11/23 86.2 kg (190 lb)  11/12/22 81.2 kg (179 lb)   PHYSICAL EXAM: General:  NAD. No resp difficulty, walked into clinic HEENT: Normal Neck: Supple. No JVD. Carotids 2+ bilat; no bruits. No lymphadenopathy or thryomegaly appreciated. Cor: PMI nondisplaced. Regular rate & rhythm. No rubs, gallops or murmurs. Lungs: Clear Abdomen: Soft, nontender, nondistended. No hepatosplenomegaly. No bruits or masses. Good bowel sounds. Extremities: No cyanosis, clubbing, rash, 1+ LLE edema Neuro: Alert & oriented x 3, cranial nerves grossly intact. Moves all 4 extremities w/o difficulty. Affect pleasant.  ECG (personally reviewed): NSR 83 bpm  ReDs: 31%  ASSESSMENT & PLAN: Chronic systolic CHF/ICM: - Echo (7/22): EF 25-30%, RV okay - R/LHC (7/22): RA 6, PA mean 23, PCWP 9, Fick CI 2.54. 90% Lcx treated with PCI/DES, diffuse disease marginal  branches, LAD and diagonal - Echo (11/22): EF 30-35% RV okay - Echo (2/23): EF 35-40%, RV okay, mild MR - cMRI (2/23): LVEF 43%, RVEF 49%, delayed enhancement suggestive of substantial LAD territory MI. - Cath 2/23 EF 35-40% showed progression of LAD disease. Now with diffuse diabetic CAD in mid to distal LAD corresponding to infarct seen on cMRI. Reviewed with IC team. Lcx stent patent. No target for intervention particularly given lack of viability on MRI. RHC ok  - Echo 4/24 showed 35-40%, RV ok - Stable NYHA IIb-III, volume ok ReDs 31% - Continue Lasix 40 mg daily, OK to take extra PRN - Continue Entresto 24/26 mg bid. (Decreased due to low BP)  - Continue Toprol XL 50 mg bid - Continue spiro 12.5 mg daily. - No SGLT2i with Type I DM - Labs today. - Repeat echo in 6 months.  2. CAD: - s/p PCI/DES mid Lcx 7/22.  - cMRI with delayed enhancement suggestive of LAD territory myocardial infarction. Dr. Gala Romney reviewed prior cath images from 07/22 personally. There is 70-80% diffuse disease in mid to distal LAD (? Event at some point with acute occlusion and subsequent recannulation) - Cath 2/23 as above; continue Medical therapy  - Stable angina. No targets for PCI.  - Can drop ASA and continue Plavix, discussed with PharmD. - Continue atorvastatin 80. - Now off Imdur with labile BP - would benefit from CR, have not been able to coordinate in past due to scheduling conflicts. Discussed at next visit.  3. Palpitations - Zio 1/24 4 runs NSVT. 1 run SVT - Feeling more palpitations - No ectopy on ECG today. - Repeat 2 week Zio  - Check CBC, BMET and TSH - Needs sleep study  4. Hyperlipidemia - On high-intensity statin - Managed by primary cardiology team - Last LDL 73   5. Type I DM: - Previously had insulin pump - A1c 8.4 -> 12-->14-->7.4 - Consider endo input  6. Snoring - In-lab sleep study ordered. - Will see if we can get this scheduled.  7. Chronic diarrhea -  Followed by GI  Follow up in 6 months with Dr. Gala Romney + echo  Jacklynn Ganong, FNP  8:52 AM

## 2023-06-10 NOTE — Progress Notes (Signed)
ReDS Vest / Clip - 06/10/23 0900       ReDS Vest / Clip   Station Marker D    Ruler Value 31.5    ReDS Value Range Low volume    ReDS Actual Value 31

## 2023-06-10 NOTE — Patient Instructions (Addendum)
Thank you for coming in today  If you had labs drawn today, any labs that are abnormal the clinic will call you No news is good news  Medications: STOP Asprin  Your provider has recommended that  you wear a Zio Patch for 14 days.  This monitor will record your heart rhythm for our review.  IF you have any symptoms while wearing the monitor please press the button.  If you have any issues with the patch or you notice a red or orange light on it please call the company at [phone].  Once you remove the patch please mail it back to the company as soon as possible so we can get the results.    Follow up appointments: You have a follow up with your General cardiology 06/30/2023 at 11:20 am  Your physician recommends that you schedule a follow-up appointment in:  6 months with echocardiogram Your physician has requested that you have an echocardiogram. Echocardiography is a painless test that uses sound waves to create images of your heart. It provides your doctor with information about the size and shape of your heart and how well your heart's chambers and valves are working. This procedure takes approximately one hour. There are no restrictions for this procedure.   Your physician has requested that you have an echocardiogram. Echocardiography is a painless test that uses sound waves to create images of your heart. It provides your doctor with information about the size and shape of your heart and how well your heart's chambers and valves are working. This procedure takes approximately one hour. There are no restrictions for this procedure.      Do the following things EVERYDAY: Weigh yourself in the morning before breakfast. Write it down and keep it in a log. Take your medicines as prescribed Eat low salt foods--Limit salt (sodium) to 2000 mg per day.  Stay as active as you can everyday Limit all fluids for the day to less than 2 liters   At the [facility], you  and your health needs are our priority. As part of our continuing mission to provide you with exceptional heart care, we have created designated Provider Care Teams. These Care Teams include your primary Cardiologist (physician) and Advanced Practice Providers (APPs- Physician Assistants and Nurse Practitioners) who all work together to provide you with the care you need, when you need it.   You may see any of the following providers on your designated Care Team at your next follow up: Dr Arvilla Meres Dr Marca Ancona Dr. Marcos Eke, NP Robbie Lis, [place] [facility] [place], [place] Brynda Peon, NP Karle Plumber, PharmD   Please be sure to bring in all your medications bottles to every appointment.    Thank you for choosing [facility]  If you have any questions or concerns before your next appointment please send [place] a message through [place] or call our office at [phone].    TO LEAVE A MESSAGE FOR THE NURSE SELECT OPTION 2, PLEASE LEAVE A MESSAGE INCLUDING: YOUR NAME DATE OF BIRTH CALL BACK NUMBER REASON FOR CALL**this is important as we prioritize the call backs  YOU WILL RECEIVE A CALL BACK THE SAME DAY AS LONG AS YOU CALL BEFORE 4:00 PM

## 2023-06-10 NOTE — Patient Instructions (Signed)
Thank you so much for coming to the clinic today!   For your sinus congestion, you can pick up DayQuil or other decongestants at your local pharmacy.   If you have any questions please feel free to the call the clinic at anytime at [phone]. It was a pleasure seeing you!  Best, Dr. Rayvon Char

## 2023-06-10 NOTE — Progress Notes (Unsigned)
CC: Diabetes follow-up  HPI: Amanda Frederick is a 52 y.o. female living with a history stated below and presents today for diabetes follow-up. Please see problem based assessment and plan for additional details.  Past Medical History:  Diagnosis Date   AC (acromioclavicular) joint bone spurs    Anemia    Anxiety    Arthritis    Bronchitis    Bulging lumbar disc    Bursitis    CHF (congestive heart failure) (HCC)    Complication of anesthesia    "has died 3 separate times under anesthesia"   Depression    Diabetes (HCC)    Environmental allergies    Fibromyalgia    High blood pressure    High cholesterol    History of COVID-19 01/2020   was hospitalized   History of endometriosis    History of uterine fibroid    Migraines    Neuropathy    eyes , hands and feet    Pinched nerve    Pneumonia    Seizure (HCC)    after eye test only   Spondylosis    Tachycardia    after COVID   Tendonitis    Vision abnormalities     Current Outpatient Medications on File Prior to Visit  Medication Sig Dispense Refill   amitriptyline (ELAVIL) 10 MG tablet Take 1 tablet (10 mg total) by mouth at bedtime. 90 tablet 3   atorvastatin (LIPITOR) 80 MG tablet Take 1 tablet (80 mg total) by mouth daily. 90 tablet 3   cholecalciferol (VITAMIN D) 1000 UNITS tablet Take 1,000 Units by mouth daily.     cholestyramine (QUESTRAN) 4 g packet MIX AND TAKE 1 PACKET (4 G TOTAL) BY MOUTH EVERY 12 (TWELVE) HOURS AS NEEDED. 180 packet 1   clopidogrel (PLAVIX) 75 MG tablet TAKE 1 TABLET BY MOUTH EVERY DAY 90 tablet 0   Continuous Blood Gluc Receiver (DEXCOM G7 RECEIVER) DEVI 1 each by Does not apply route as needed. 1 each 0   Continuous Blood Gluc Sensor (DEXCOM G7 SENSOR) MISC Use to monitor blood sugar as directed 9 each 3   dicyclomine (BENTYL) 20 MG tablet TAKE 1 TABLET BY MOUTH EVERY 6 HOURS 120 tablet 5   ENTRESTO 24-26 MG TAKE 1 TABLET BY MOUTH TWICE A DAY 60 tablet 8   fluticasone (FLONASE) 50  MCG/ACT nasal spray INSTILL 1 SPRAY INTO BOTH NOSTRILS DAILY 16 mL 2   furosemide (LASIX) 40 MG tablet TAKE 1 TABLET BY MOUTH DAILY AS NEEDED (Patient taking differently: Take 40 mg by mouth daily.) 90 tablet 2   glucose blood (BAYER CONTOUR NEXT TEST) test strip Test 8-10 times per day 8-10 lancets/day 300 each 11   insulin aspart (NOVOLOG) 100 UNIT/ML injection Inject 10 units with meals three times a day. Inject 20 units for BS > 400, 15 units for BS 300-399, 10 units for BS  200-299 and 5 units for BS 100-199 10 mL 11   Insulin Infusion Pump (T:SLIM X2 CONTROL-IQ PUMP) DEVI 1 each by Does not apply route as needed. 1 each 0   Insulin Infusion Pump Supplies (PARADIGM QUICK-SET 32" ) MISC Use with novolog in insulin pump 40 each 3   Insulin Infusion Pump Supplies (T:SLIM X2 CARTRIDGE) MISC Use with insulin pump 30 each 4   Insulin Infusion Pump Supplies (T:SLIM X2/CONTROL-IQ/ACC/INSTR) MISC Use with insulin pump 1 each 1   loperamide (IMODIUM) 2 MG capsule Take 1 capsule (2 mg total) by mouth as  needed for diarrhea or loose stools. Take 2 mg after every loose bowel movement with a maximum of 16 mg a day (maximum of 8 pills a day) 90 capsule 2   Magnesium 250 MG TABS Take 250 mg by mouth daily.     metoprolol succinate (TOPROL-XL) 50 MG 24 hr tablet TAKE 1 TABLET BY MOUTH TWICE A DAY 60 tablet 5   Multiple Vitamin (MULTIVITAMIN) capsule Take 1 capsule by mouth daily.     potassium chloride SA (KLOR-CON M) 20 MEQ tablet TAKE 1 TABLET BY MOUTH EVERY DAY 90 tablet 3   spironolactone (ALDACTONE) 25 MG tablet Take 0.5 tablets (12.5 mg total) by mouth daily. NEEDS FOLLOW UP APPOINTMENT FOR ANYMORE REFILLS 45 tablet 0   [DISCONTINUED] DULoxetine (CYMBALTA) 60 MG capsule Take 1 capsule (60 mg total) by mouth daily. (Patient not taking: Reported on 01/03/2021) 30 capsule 12   Current Facility-Administered Medications on File Prior to Visit  Medication Dose Route Frequency Provider Last Rate Last Admin    sodium chloride flush (NS) 0.9 % injection 3 mL  3 mL Intravenous Q12H Bensimhon, Bevelyn Buckles, MD        Family History  Problem Relation Age of Onset   Anemia Mother    High Cholesterol Mother    Diabetes Mother    Arthritis Mother    Diabetes type I Maternal Grandmother    Colon cancer Neg Hx    Esophageal cancer Neg Hx    Stomach cancer Neg Hx    Colon polyps Neg Hx    Breast cancer Neg Hx     Social History   Socioeconomic History   Marital status: Married    Spouse name: Leory Plowman   Number of children: 3   Years of education: college   Highest education level: Not on file  Occupational History    Comment: Home maker  Tobacco Use   Smoking status: Never   Smokeless tobacco: Never  Vaping Use   Vaping status: Not on file  Substance and Sexual Activity   Alcohol use: Not Currently    Comment: special occassions   Drug use: No   Sexual activity: Not on file  Other Topics Concern   Not on file  Social History Narrative   Patient lives at home with her husband Leory Plowman. Worked previously in optometrist office, left work due to personnel issues. Patient has college education. Patient drinks 4-6 caffeine drinks daily.Right handed.   Social Determinants of Health   Financial Resource Strain: Low Risk  (07/29/2022)   Overall Financial Resource Strain (CARDIA)    Difficulty of Paying Living Expenses: Not hard at all  Food Insecurity: No Food Insecurity (07/29/2022)   Hunger Vital Sign    Worried About Running Out of Food in the Last Year: Never true    Ran Out of Food in the Last Year: Never true  Transportation Needs: No Transportation Needs (07/29/2022)   PRAPARE - Administrator, Civil Service (Medical): No    Lack of Transportation (Non-Medical): No  Physical Activity: Not on file  Stress: Stress Concern Present (07/29/2022)   [employer] of Occupational Health - Occupational Stress Questionnaire    Feeling of Stress : Very much  Social Connections: Not on  file  Intimate Partner Violence: Not At Risk (07/29/2022)   Humiliation, Afraid, Rape, and Kick questionnaire    Fear of Current or Ex-Partner: No    Emotionally Abused: No    Physically Abused: No    Sexually  Abused: No    Review of Systems: ROS negative except for what is noted on the assessment and plan.  Vitals:   06/10/23 1555  BP: 133/76  Pulse: 98  Temp: 98 F (36.7 C)  TempSrc: Oral  SpO2: 99%  Weight: 201 lb 6.4 oz (91.4 kg)  Height: 5\' 6"  (1.676 m)   Physical Exam Constitutional:      Appearance: Normal appearance.  HENT:     Head: Normocephalic and atraumatic.     Nose: Congestion and rhinorrhea present.     Right Sinus: Maxillary sinus tenderness present.     Left Sinus: Maxillary sinus tenderness present.  Cardiovascular:     Rate and Rhythm: Normal rate and regular rhythm.  Pulmonary:     Effort: Pulmonary effort is normal.     Breath sounds: Normal breath sounds.  Abdominal:     General: Abdomen is flat. Bowel sounds are normal.     Palpations: Abdomen is soft.  Neurological:     Mental Status: She is alert.  Psychiatric:        Attention and Perception: Attention and perception normal.    Assessment & Plan:   DM (diabetes mellitus), type 1 (HCC) Patient last seen in the clinic 9 months ago in which her A1c was 14.  Today, her A1c is 7.0.  She is currently managing her diabetes with NovoLog and insulin pump and is followed by endocrinology.  She notes a couple instances of lows over the past month but these have been asymptomatic.  Generally, her glucose is around 100-110 in the morning.  She has no signs or symptoms of highs. - Follow-up microalbumin - Continue to follow-up with endocrinology  Maxillary sinusitis Patient notes a 2-week history of sinus congestion, ear pain, and a sore throat.  She feels like over the past week, her ear pain has worsened.  She has tried cough drops but these have not resolved her symptoms.  She has also used Flonase  which has helped with her sinus congestion.  She denies any fevers or sick contacts but does endorse nausea and ear popping.  Of note, she has a history of turbinate surgery.  On examination, her tympanic membranes are clear with no appearance suggesting otitis media.  Her nares are notable for bright pink mucosa with inflammation but clear rhinorrhea.  She endorses significant maxillary sinus congestion.  She has not used any over-the-counter sinus decongestions, so we will trial these.  Will hold off on beginning antibiotics as patient has been afebrile in the etiology is likely viral. - Begin over the counter decongestants - Continue Flonase  Neck pain Patient endorses worsening of her neck pain, which has been a chronic issue over the past decade.  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  At that time, she was seen by multiple neurosurgeons, but elected not to pursue surgery.  Patient notes that her arms will go numb after exerting them for a short amount of time.  She endorses burning in her hands and pain in her right shoulder.  She feels like these have worsened over the past 2 years.  In the past, she has taken Mobic, Cymbalta and Lyrica.  She says physical therapy and Cymbalta have not been helpful in the past.  Lyrica was helpful, so we will begin her on this again for symptom management.  Patient is amenable to following up with neurosurgery again, so we will send  in a referral for this. - Begin Lyrica 75 mg twice daily - Referral to neurosurgery  Patient seen with Dr. Darletta Moll, MD  [facility] Internal Medicine, PGY-1 Date 06/11/2023 Time 8:32 AM

## 2023-06-11 ENCOUNTER — Telehealth: Payer: Self-pay

## 2023-06-11 DIAGNOSIS — J320 Chronic maxillary sinusitis: Secondary | ICD-10-CM | POA: Insufficient documentation

## 2023-06-11 LAB — MICROALBUMIN / CREATININE URINE RATIO
Creatinine, Urine: 57 mg/dL
Microalb/Creat Ratio: <5 mg/g{creat} (ref 0–29)
Microalbumin, Urine: <3 ug/mL

## 2023-06-11 NOTE — Assessment & Plan Note (Signed)
Patient last seen in the clinic 9 months ago in which her A1c was 14.  Today, her A1c is 7.0.  She is currently managing her diabetes with NovoLog and insulin pump and is followed by endocrinology.  She notes a couple instances of lows over the past month but these have been asymptomatic.  Generally, her glucose is around 100-110 in the morning.  She has no signs or symptoms of highs. - Follow-up microalbumin - Continue to follow-up with endocrinology

## 2023-06-11 NOTE — Telephone Encounter (Signed)
Prior Authorization for patient ([employer]) came through on cover my meds was submitted with last office notes awaiting approval or denial.  KEY: [id number]

## 2023-06-11 NOTE — Assessment & Plan Note (Signed)
Patient notes a 2-week history of sinus congestion, ear pain, and a sore throat.  She feels like over the past week, her ear pain has worsened.  She has tried cough drops but these have not resolved her symptoms.  She has also used Flonase which has helped with her sinus congestion.  She denies any fevers or sick contacts but does endorse nausea and ear popping.  Of note, she has a history of turbinate surgery.  On examination, her tympanic membranes are clear with no appearance suggesting otitis media.  Her nares are notable for bright pink mucosa with inflammation but clear rhinorrhea.  She endorses significant maxillary sinus congestion.  She has not used any over-the-counter sinus decongestions, so we will trial these.  Will hold off on beginning antibiotics as patient has been afebrile in the etiology is likely viral. - Begin over the counter decongestants - Continue Flonase

## 2023-06-11 NOTE — Assessment & Plan Note (Signed)
Patient endorses worsening of her neck pain, which has been a chronic issue over the past decade.  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  At that time, she was seen by multiple neurosurgeons, but elected not to pursue surgery.  Patient notes that her arms will go numb after exerting them for a short amount of time.  She endorses burning in her hands and pain in her right shoulder.  She feels like these have worsened over the past 2 years.  In the past, she has taken Mobic, Cymbalta and Lyrica.  She says physical therapy and Cymbalta have not been helpful in the past.  Lyrica was helpful, so we will begin her on this again for symptom management.  Patient is amenable to following up with neurosurgery again, so we will send in a referral for this. - Begin Lyrica 75 mg twice daily - Referral to neurosurgery

## 2023-06-15 NOTE — Telephone Encounter (Signed)
Decision:Denied Your plan only covers this drug when you have tried another drug that your plan covers (preferred drug) and it did not work well for you. We reviewed the information we had. You do not meet these conditions. Your request has been denied. Your doctor can send [place] any new or missing information for [place] to review. For this drug, you may have to meet other criteria. You can request the drug policy for more details. You can also request other plan documents for your review.  FYI no preferred medications are listed.

## 2023-06-16 NOTE — Progress Notes (Signed)
Sent patient MyChart message to discuss results. Microalbumin/creatinine normal. Will continue to monitor.

## 2023-06-18 NOTE — Progress Notes (Signed)
Internal Medicine Clinic Attending  I was physically present during the key portions of the resident provided service and participated in the medical decision making of patient's management care. I reviewed pertinent patient test results.  The assessment, diagnosis, and plan were formulated together and I agree with the documentation in the resident's note.  Williams, Julie Anne, MD  

## 2023-06-19 ENCOUNTER — Encounter: Payer: Self-pay | Admitting: *Deleted

## 2023-06-19 ENCOUNTER — Telehealth: Payer: Self-pay | Admitting: *Deleted

## 2023-06-19 NOTE — Patient Outreach (Signed)
  Care Coordination   06/19/2023 Name: Amanda Frederick MRN: [id number] DOB: 1971/04/04   Care Coordination Outreach Attempts:  An unsuccessful telephone outreach was attempted today to offer the patient information about available care coordination services.  Follow Up Plan:  Additional outreach attempts will be made to offer the patient care coordination information and services.   Encounter Outcome:  No Answer   Care Coordination Interventions:  No, not indicated    Reece Levy, MSW, LCSW [facility]  [facility], [facility] Licensed Clinical Social Worker Care Coordinator  [phone]

## 2023-06-22 NOTE — Telephone Encounter (Signed)
PA has been appealed by [facility] Approved effective 06/19/2023-06/18/2024 Approval has been faxed to the pharmacy

## 2023-06-23 DIAGNOSIS — E10319 Type 1 diabetes mellitus with unspecified diabetic retinopathy without macular edema: Secondary | ICD-10-CM | POA: Diagnosis not present

## 2023-06-23 DIAGNOSIS — I2510 Atherosclerotic heart disease of native coronary artery without angina pectoris: Secondary | ICD-10-CM | POA: Diagnosis not present

## 2023-06-23 DIAGNOSIS — I509 Heart failure, unspecified: Secondary | ICD-10-CM | POA: Diagnosis not present

## 2023-06-23 DIAGNOSIS — D751 Secondary polycythemia: Secondary | ICD-10-CM | POA: Diagnosis not present

## 2023-06-23 DIAGNOSIS — I10 Essential (primary) hypertension: Secondary | ICD-10-CM | POA: Diagnosis not present

## 2023-06-23 DIAGNOSIS — H3552 Pigmentary retinal dystrophy: Secondary | ICD-10-CM | POA: Diagnosis not present

## 2023-06-23 DIAGNOSIS — E538 Deficiency of other specified B group vitamins: Secondary | ICD-10-CM | POA: Diagnosis not present

## 2023-06-23 DIAGNOSIS — Z8616 Personal history of COVID-19: Secondary | ICD-10-CM | POA: Diagnosis not present

## 2023-06-23 DIAGNOSIS — E669 Obesity, unspecified: Secondary | ICD-10-CM | POA: Diagnosis not present

## 2023-06-23 DIAGNOSIS — E108 Type 1 diabetes mellitus with unspecified complications: Secondary | ICD-10-CM | POA: Diagnosis not present

## 2023-06-23 DIAGNOSIS — E1042 Type 1 diabetes mellitus with diabetic polyneuropathy: Secondary | ICD-10-CM | POA: Diagnosis not present

## 2023-06-23 DIAGNOSIS — E782 Mixed hyperlipidemia: Secondary | ICD-10-CM | POA: Diagnosis not present

## 2023-06-28 ENCOUNTER — Other Ambulatory Visit: Payer: Self-pay | Admitting: Student

## 2023-06-28 NOTE — Progress Notes (Unsigned)
Cardiology Office Note:  .   Date:  06/30/2023  ID:  Amanda Frederick, DOB Sep 15, 1970, MRN [id number] PCP: Rocky Morel, DO  [facility] Providers Cardiologist: Parke Poisson, MD  }   History of Present Illness: .   Amanda Frederick is a 52 y.o. female DM1, HTN, HLD, CAD s/p PCI/DES mid Lcx 7/22, hx fibromyalgia, neuropathy, hx COVID-19 infection 02/2020. July 2022 with acute systolic CHF. Echo 7/22: EF 25-30%, RV ok, large left pleural effusion, mild MR.   Diuresed with IV lasix. Underwent thoracentesis for L pleural effusion. Cath 09/20/21 showed progression of LAD disease. Now with diffuse diabetic CAD in mid to distal LAD corresponding to infarct seen on cMRI.  No target for intervention particularly given lack of viability on MRI. EF 35-45%   Last seen in Advanced Heart Failure clinic on 06/10/2023 by Prince Rome, NP, at that time the patient was continued on Entresto 24-26 twice daily, Lasix 40 mg daily, Toprol, spironolactone, no SGLT 2 inhibitors with type 2 diabetes.  A repeat ZIO 2-week monitor was ordered on 06/10/2023.  Also she was to be considered for sleep study.Zio is not yet resulted.   She comes today stating that she is feeling some better.  She is able to walk around outside and work with her chickens without being exhausted.  Her weight has been labile which is concerning to her.  She has not yet taken her medications.  She has not yet sent the ZIO monitor back.  She denies chest pain, palpitations, but does continue to have some mild dyspnea on exertion.  She is doing her best to avoid salty foods.   ROS: As above otherwise negative  Studies Reviewed: .      Echocardiogram 11/03/2022 1. Left ventricular ejection fraction, by estimation, is 35 to 40%. The  left ventricle has moderately decreased function. The left ventricle  demonstrates regional wall motion abnormalities (see scoring  diagram/findings for description). Left ventricular   diastolic parameters  are indeterminate.   2. Right ventricular systolic function is normal. The right ventricular  size is normal. There is normal pulmonary artery systolic pressure. The  estimated right ventricular systolic pressure is 19.8 mmHg.   3. The mitral valve is normal in structure. Mild mitral valve  regurgitation. No evidence of mitral stenosis.   4. The aortic valve is tricuspid. Aortic valve regurgitation is trivial.  No aortic stenosis is present.   5. The inferior vena cava is normal in size with greater than 50%  respiratory variability, suggesting right atrial pressure of 3 mmHg.   Physical Exam:   VS:  BP (!) 142/78 (BP Location: Right Arm, Patient Position: Sitting, Cuff Size: Normal)   Pulse (!) 103   Ht 5\' 6"  (1.676 m)   Wt 204 lb (92.5 kg)   SpO2 99%   BMI 32.93 kg/m    Wt Readings from Last 3 Encounters:  06/30/23 204 lb (92.5 kg)  06/10/23 201 lb 6.4 oz (91.4 kg)  06/10/23 202 lb 3.2 oz (91.7 kg)    GEN: Well nourished, well developed in no acute distress NECK: No JVD; No carotid bruits CARDIAC: RRR, distant heart sounds, no murmurs, rubs, gallops RESPIRATORY:  Clear to auscultation without rales, wheezing or rhonchi  ABDOMEN: Soft, non-tender, non-distended EXTREMITIES:  No significant edema; No deformity   ASSESSMENT AND PLAN: .    HFrEF: Most recent echocardiogram 11/03/2022 EF of 35 to 40%.  She remains on guideline directed medical therapy with Entresto, beta-blocker,, spironolactone  and Lasix as needed.  She is not on SGLT2 due to history of diabetes.  She will continue to follow in Advanced Heart Failure Clinic.  They are continuing the workup for amyloidosis.   2.  Coronary artery disease: History of PCI and drug-eluting stent to the left circumflex in July 2022.  She continues on clopidogrel.  May need to consider stopping this and just putting her on aspirin on follow-up as it has been over 2 years.  3.  Hypercholesterolemia: Goal of LDL less than 70.  She remains on  atorvastatin 80 mg daily.  Most recent labs on 06/10/2023, total cholesterol 136, LDL 59, HDL 45.    4.  Daytime somnolence: Was to be scheduled for home sleep study.  I will go ahead and order this now as it is requested by Advanced Heart Failure team so that when they see her on follow-up she will have information.        Signed, Bettey Mare. Liborio Nixon, ANP, AACC

## 2023-06-29 ENCOUNTER — Ambulatory Visit: Payer: Self-pay | Admitting: *Deleted

## 2023-06-29 NOTE — Patient Outreach (Addendum)
  Care Coordination   Follow Up Visit Note   06/29/2023 Name: Jerrine Milleson MRN: [id number] DOB: 02-Apr-1971  Avalynne Komm is a 52 y.o. year old female who sees Rocky Morel, DO for primary care. I spoke with  Lesli Albee by phone today.  What matters to the patients health and wellness today?  "Things are better at home"    Goals Addressed             This Visit's Progress    COMPLETED: Depression/Adjustment to life changes        Activities and task to complete in order to accomplish goals.    Consider counseling support as discussed- call your insurance provider for more information about your Enhanced Benefits (mental health outpatient copay?glad you have been able to change your insurance to one that covers RX,etc) New Endocrinologist initial visit went well with 3 month follow up planned  Continue to feel empowered/valuing self and speaking up as we discussed- set house expectations, bundaries and responsibilities, rules with consequences that you support Keep enjoying your gardening and pig raising !    Reach out to your PCP for scheduling          SDOH assessments and interventions completed:  Yes     Care Coordination Interventions:  Yes, provided  Interventions Today    Flowsheet Row Most Recent Value  Mental Health Interventions   Mental Health Discussed/Reviewed Mental Health Discussed, Coping Strategies  [Pt reports things are better at home- less conflict at home - kids planning to move out as planned.]       Follow up plan: No further intervention required.   Encounter Outcome:  Patient Visit Completed

## 2023-06-29 NOTE — Patient Instructions (Signed)
Visit Information  Thank you for taking time to visit with me today. Please don't hesitate to contact me if I can be of assistance to you.   Following are the goals we discussed today:   Goals Addressed             This Visit's Progress    COMPLETED: Depression/Adjustment to life changes        Activities and task to complete in order to accomplish goals.    Consider counseling support as discussed- call your insurance provider for more information about your Enhanced Benefits (mental health outpatient copay?glad you have been able to change your insurance to one that covers RX,etc) New Endocrinologist initial visit went well with 3 month follow up planned  Continue to feel empowered/valuing self and speaking up as we discussed- set house expectations, bundaries and responsibilities, rules with consequences that you support Keep enjoying your gardening and pig raising !    Reach out to your PCP for scheduling           Please call your PCP or our care guide team at [phone] if you need to reconnect with [place].   If you are experiencing a Mental Health or Behavioral Health Crisis or need someone to talk to, please call the Suicide and Crisis Lifeline: 988 call 911   The patient verbalized understanding of instructions, educational materials, and care plan provided today and DECLINED offer to receive copy of patient instructions, educational materials, and care plan.   No further follow up required:     Reece Levy, MSW, LCSW [facility]  [facility], [facility] Licensed Clinical Social Worker Care Coordinator  [phone]

## 2023-06-30 ENCOUNTER — Encounter: Payer: Self-pay | Admitting: Adult Health

## 2023-06-30 ENCOUNTER — Ambulatory Visit: Payer: 59 | Attending: Adult Health | Admitting: Adult Health

## 2023-06-30 VITALS — BP 142/78 | HR 103 | Ht 66.0 in | Wt 204.0 lb

## 2023-06-30 DIAGNOSIS — E7800 Pure hypercholesterolemia, unspecified: Secondary | ICD-10-CM

## 2023-06-30 DIAGNOSIS — R400 Somnolence: Secondary | ICD-10-CM

## 2023-06-30 DIAGNOSIS — I2510 Atherosclerotic heart disease of native coronary artery without angina pectoris: Secondary | ICD-10-CM

## 2023-06-30 DIAGNOSIS — I5042 Chronic combined systolic (congestive) and diastolic (congestive) heart failure: Secondary | ICD-10-CM | POA: Diagnosis not present

## 2023-06-30 NOTE — Patient Instructions (Signed)
Medication Instructions:  No changes *If you need a refill on your cardiac medications before your next appointment, please call your pharmacy*   Lab Work: No Labs If you have labs (blood work) drawn today and your tests are completely normal, you will receive your results only by: MyChart Message (if you have MyChart) OR A paper copy in the mail If you have any lab test that is abnormal or we need to change your treatment, we will call you to review the results.   Testing/Procedures: [facility]. Your physician has recommended that you have a sleep study. This test records several body functions during sleep, including: brain activity, eye movement, oxygen and carbon dioxide blood levels, heart rate and rhythm, breathing rate and rhythm, the flow of air through your mouth and nose, snoring, body muscle movements, and chest and belly movement.    Follow-Up: At [facility], you and your health needs are our priority.  As part of our continuing mission to provide you with exceptional heart care, we have created designated Provider Care Teams.  These Care Teams include your primary Cardiologist (physician) and Advanced Practice Providers (APPs -  Physician Assistants and Nurse Practitioners) who all work together to provide you with the care you need, when you need it.  We recommend signing up for the patient portal called "MyChart".  Sign up information is provided on this After Visit Summary.  MyChart is used to connect with patients for Virtual Visits (Telemedicine).  Patients are able to view lab/test results, encounter notes, upcoming appointments, etc.  Non-urgent messages can be sent to your provider as well.   To learn more about what you can do with MyChart, go to [email or web address].    Your next appointment:   6 month(s)  Provider:   Parke Poisson, MD

## 2023-07-01 ENCOUNTER — Encounter: Payer: Self-pay | Admitting: *Deleted

## 2023-07-07 DIAGNOSIS — I493 Ventricular premature depolarization: Secondary | ICD-10-CM | POA: Diagnosis not present

## 2023-07-08 NOTE — Addendum Note (Signed)
Encounter addended by: Crissie Figures, RN on: 07/08/2023 11:48 AM  Actions taken: Imaging Exam ended

## 2023-07-16 ENCOUNTER — Other Ambulatory Visit (HOSPITAL_COMMUNITY): Payer: Self-pay | Admitting: Internal Medicine

## 2023-07-26 ENCOUNTER — Other Ambulatory Visit (HOSPITAL_COMMUNITY): Payer: Self-pay | Admitting: Internal Medicine

## 2023-08-06 DIAGNOSIS — E103413 Type 1 diabetes mellitus with severe nonproliferative diabetic retinopathy with macular edema, bilateral: Secondary | ICD-10-CM | POA: Diagnosis not present

## 2023-08-10 ENCOUNTER — Other Ambulatory Visit (HOSPITAL_COMMUNITY): Payer: Self-pay | Admitting: Internal Medicine

## 2023-08-14 ENCOUNTER — Other Ambulatory Visit: Payer: Self-pay | Admitting: Internal Medicine

## 2023-08-27 ENCOUNTER — Other Ambulatory Visit: Payer: Self-pay | Admitting: Gastroenterology

## 2023-08-28 ENCOUNTER — Other Ambulatory Visit: Payer: Self-pay | Admitting: Student

## 2023-08-28 DIAGNOSIS — E1059 Type 1 diabetes mellitus with other circulatory complications: Secondary | ICD-10-CM

## 2023-08-31 ENCOUNTER — Ambulatory Visit (INDEPENDENT_AMBULATORY_CARE_PROVIDER_SITE_OTHER)
Admit: 2023-08-31 | Discharge: 2023-08-31 | Disposition: A | Discharge status: Home / Self Care | Payer: 59 | Attending: Internal Medicine | Admitting: Internal Medicine

## 2023-08-31 ENCOUNTER — Ambulatory Visit (HOSPITAL_BASED_OUTPATIENT_CLINIC_OR_DEPARTMENT_OTHER)
Admission: RE | Admit: 2023-08-31 | Discharge: 2023-08-31 | Disposition: A | Discharge status: Home / Self Care | Payer: 59 | Source: Ambulatory Visit | Attending: Family Medicine | Admitting: Family Medicine

## 2023-08-31 ENCOUNTER — Encounter (HOSPITAL_BASED_OUTPATIENT_CLINIC_OR_DEPARTMENT_OTHER): Payer: Self-pay

## 2023-08-31 VITALS — BP 109/74 | HR 76 | Temp 98.0°F | Resp 20

## 2023-08-31 DIAGNOSIS — R051 Acute cough: Secondary | ICD-10-CM

## 2023-08-31 DIAGNOSIS — J208 Acute bronchitis due to other specified organisms: Secondary | ICD-10-CM

## 2023-08-31 DIAGNOSIS — R52 Pain, unspecified: Secondary | ICD-10-CM

## 2023-08-31 DIAGNOSIS — J984 Other disorders of lung: Secondary | ICD-10-CM | POA: Diagnosis not present

## 2023-08-31 MED ORDER — PROMETHAZINE-DM 6.25-15 MG/5ML PO SYRP: 5.0000 mL | ORAL_SOLUTION | Freq: Four times a day (QID) | ORAL | 0 refills | Status: DC | PRN

## 2023-08-31 MED ORDER — PREDNISONE 20 MG PO TABS: 20.0000 mg | ORAL_TABLET | Freq: Every day | ORAL | 0 refills | Status: AC

## 2023-08-31 MED ORDER — IPRATROPIUM-ALBUTEROL 0.5-2.5 (3) MG/3ML IN SOLN: 3.0000 mL | RESPIRATORY_TRACT | 0 refills | Status: AC | PRN

## 2023-08-31 MED ORDER — IPRATROPIUM-ALBUTEROL 0.5-2.5 (3) MG/3ML IN SOLN
3.0000 mL | Freq: Once | RESPIRATORY_TRACT | Status: AC
  Administered 2023-08-31: 3 mL via RESPIRATORY_TRACT

## 2023-08-31 NOTE — ED Triage Notes (Signed)
Pt c/o sinus congestion x 1 week, sore throat, ears hurting, body aches started on Thursday. Granddaughter recently had the flu.

## 2023-08-31 NOTE — Discharge Instructions (Addendum)
Chest x-ray appears negative for pneumonia.  Exam history and findings are most consistent with viral bronchitis.  Due to the duration of her symptoms, COVID and flu testing was not valuable and would not change the plan of care.  The patient does have diabetes.  We discussed use of prednisone for her bronchitis and I believe that is important.  But I cautioned her to monitor her glucose closely and watch for hyperglycemia.  She does use a continuous glucose monitor so that is helpful.  Prednisone, 20 mg, 1 daily for 5 days.  DuoNeb, 1 vial, through nebulizer machine, every 4-6 hours, as needed for wheezing or cough.  Promethazine DM, 5 mL or 1 teaspoon, every 6 hours, as needed for cough.  Follow-up if symptoms do not improve, worsen or new symptoms occur.  Needs a 2-week recheck with her family doctor.  Encouraged to call family doctor and make that appointment.

## 2023-08-31 NOTE — ED Provider Notes (Addendum)
[facility]    CSN: [id number] Arrival date & time: 08/31/23  1129      History   Chief Complaint Chief Complaint  Patient presents with   Cough    Coughing,sinus pain,ear ache, congestion and body aches - Entered by patient    HPI Amanda Frederick is a 53 y.o. female.   Pt c/o sinus congestion x 1 week, sore throat, ears hurting, body aches started on Thursday. Granddaughter recently had the flu.     Cough Associated symptoms: ear pain and sore throat   Associated symptoms: no chest pain, no chills, no fever, no rash and no shortness of breath     Past Medical History:  Diagnosis Date   AC (acromioclavicular) joint bone spurs    Anemia    Anxiety    Arthritis    Bronchitis    Bulging lumbar disc    Bursitis    CHF (congestive heart failure) (HCC)    Complication of anesthesia    "has died 3 separate times under anesthesia"   Depression    Diabetes (HCC)    Environmental allergies    Fibromyalgia    High blood pressure    High cholesterol    History of COVID-19 01/2020   was hospitalized   History of endometriosis    History of uterine fibroid    Migraines    Neuropathy    eyes , hands and feet    Pinched nerve    Pneumonia    Seizure (HCC)    after eye test only   Spondylosis    Tachycardia    after COVID   Tendonitis    Vision abnormalities     Patient Active Problem List   Diagnosis Date Noted   Maxillary sinusitis 06/11/2023   Healthcare maintenance 10/14/2022   Upper respiratory tract infection due to influenza 10/09/2022   Coronary artery disease 07/11/2022   Chronic diarrhea 07/11/2022   Orthostatic hypotension 07/11/2022   Stable angina (HCC) 07/09/2022   Insomnia 06/16/2022   Heart failure with improved ejection fraction (HFimpEF) (HCC) 02/15/2021   Displaced bimalleolar fracture of left lower leg, initial encounter for closed fracture 01/02/2021   Diabetic neuropathy (HCC) 03/03/2020   Anxiety 03/03/2020   HLD  (hyperlipidemia) 03/03/2020   Neck pain 04/22/2013   Depression    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   DM (diabetes mellitus), type 1 (HCC) 03/21/2013   Hereditary and idiopathic peripheral neuropathy 03/21/2013   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   Generalized OA 03/21/2013   Essential hypertension 03/21/2013   Low back pain 03/21/2013   Polycythemia, secondary 03/21/2013    Past Surgical History:  Procedure Laterality Date   CORONARY STENT INTERVENTION N/A 02/19/2021   Procedure: CORONARY STENT INTERVENTION;  Surgeon: Corky Crafts, MD;  Location: [facility];  Service: Cardiovascular;  Laterality: N/A;   NASAL SINUS SURGERY     ORIF ANKLE FRACTURE Left 01/04/2021   Procedure: OPEN REDUCTION INTERNAL FIXATION (ORIF) LEFT BIMALLEOLAR ANKLE FRACTURE;  Surgeon: Kathryne Hitch, MD;  Location: [facility];  Service: Orthopedics;  Laterality: Left;   RIGHT/LEFT HEART CATH AND CORONARY ANGIOGRAPHY N/A 02/19/2021   Procedure: RIGHT/LEFT HEART CATH AND CORONARY ANGIOGRAPHY;  Surgeon: Corky Crafts, MD;  Location: [facility];  Service: Cardiovascular;  Laterality: N/A;   RIGHT/LEFT HEART CATH AND CORONARY ANGIOGRAPHY N/A 09/20/2021   Procedure: RIGHT/LEFT HEART CATH AND CORONARY ANGIOGRAPHY;  Surgeon: Dolores Patty, MD;  Location: [facility];  Service: Cardiovascular;  Laterality: N/A;   TUBAL LIGATION     VAGINAL HYSTERECTOMY      OB History   No obstetric history on file.      Home Medications    Prior to Admission medications   Medication Sig Start Date End Date Taking? Authorizing Provider  amitriptyline (ELAVIL) 10 MG tablet Take 1 tablet (10 mg total) by mouth at bedtime. 03/11/23  Yes Jenel Lucks, MD  atorvastatin (LIPITOR) 80 MG tablet TAKE 1 TABLET BY MOUTH EVERY DAY 08/11/23  Yes Bensimhon, Bevelyn Buckles, MD  clopidogrel (PLAVIX) 75 MG tablet TAKE 1 TABLET BY MOUTH EVERY DAY 08/14/23  Yes Bensimhon, Bevelyn Buckles, MD   Continuous Blood Gluc Sensor (DEXCOM G7 SENSOR) MISC Use to monitor blood sugar as directed 10/14/22  Yes Amponsah, Flossie Buffy, MD  dicyclomine (BENTYL) 20 MG tablet TAKE 1 TABLET BY MOUTH EVERY 6 HOURS 05/20/23  Yes Jenel Lucks, MD  ENTRESTO 24-26 MG TAKE 1 TABLET BY MOUTH TWICE A DAY 06/02/23  Yes [place], Jessica M, FNP  ipratropium-albuterol (DUONEB) 0.5-2.5 (3) MG/3ML SOLN Take 3 mLs by nebulization every 4 (four) hours as needed (wheezing). 08/31/23  Yes Prescilla Sours, FNP  metoprolol succinate (TOPROL-XL) 50 MG 24 hr tablet TAKE 1 TABLET BY MOUTH TWICE A DAY 08/11/23  Yes Bensimhon, Bevelyn Buckles, MD  potassium chloride SA (KLOR-CON M20) 20 MEQ tablet TAKE 1 TABLET BY MOUTH EVERY DAY 08/11/23  Yes Bensimhon, Bevelyn Buckles, MD  predniSONE (DELTASONE) 20 MG tablet Take 1 tablet (20 mg total) by mouth daily with breakfast for 5 days. 08/31/23 09/05/23 Yes Prescilla Sours, FNP  pregabalin (LYRICA) 75 MG capsule Take 1 capsule (75 mg total) by mouth 2 (two) times daily. 06/10/23  Yes Morrie Sheldon, MD  promethazine-dextromethorphan (PROMETHAZINE-DM) 6.25-15 MG/5ML syrup Take 5 mLs by mouth 4 (four) times daily as needed. May make drowsy, do no use and drive. 08/31/23  Yes Prescilla Sours, FNP  spironolactone (ALDACTONE) 25 MG tablet TAKE 0.5 TABLETS (12.5 MG TOTAL) BY MOUTH DAILY. NEEDS FOLLOW UP APPOINTMENT FOR ANYMORE REFILLS 07/16/23  Yes Bensimhon, Bevelyn Buckles, MD  cholecalciferol (VITAMIN D) 1000 UNITS tablet Take 1,000 Units by mouth daily.    [provider]  cholestyramine (QUESTRAN) 4 g packet MIX AND TAKE 1 PACKET (4 G TOTAL) BY MOUTH EVERY 12 (TWELVE) HOURS AS NEEDED. 08/27/23   Jenel Lucks, MD  Continuous Blood Gluc Receiver (DEXCOM G7 RECEIVER) DEVI 1 each by Does not apply route as needed. 07/09/22   Rocky Morel, DO  fluticasone [facility]) 50 MCG/ACT nasal spray INSTILL 1 SPRAY INTO BOTH NOSTRILS DAILY 06/29/23   Rocky Morel, DO  furosemide (LASIX) 40 MG tablet TAKE 1 TABLET BY MOUTH  EVERY DAY AS NEEDED 07/27/23   Bensimhon, Bevelyn Buckles, MD  glucose blood (BAYER CONTOUR NEXT TEST) test strip Test 8-10 times per day 8-10 lancets/day 07/24/22   Rocky Morel, DO  Insulin Infusion Pump (T:SLIM X2 CONTROL-IQ PUMP) DEVI 1 each by Does not apply route as needed. 10/28/22   Belva Agee, MD  Insulin Infusion Pump Supplies (PARADIGM QUICK-SET 32" ) MISC Use with novolog in insulin pump 10/28/22   Katsadouros, Vasilios, MD  Insulin Infusion Pump Supplies (T:SLIM X2 CARTRIDGE) MISC Use with insulin pump 10/28/22   Katsadouros, Vasilios, MD  Insulin Infusion Pump Supplies (T:SLIM X2/CONTROL-IQ/ACC/INSTR) MISC Use with insulin pump 10/28/22   Katsadouros, Vasilios, MD  loperamide (IMODIUM) 2 MG capsule Take 1 capsule (2 mg total) by mouth as needed  for diarrhea or loose stools. Take 2 mg after every loose bowel movement with a maximum of 16 mg a day (maximum of 8 pills a day) 07/09/22   Rocky Morel, DO  Magnesium 250 MG TABS Take 250 mg by mouth daily.    [provider]  Multiple Vitamin (MULTIVITAMIN) capsule Take 1 capsule by mouth daily.    [provider]  NOVOLOG 100 UNIT/ML injection TAKE UP TO 200 UNITS PER DAY VIA THE PUMP AS INSTRUCTED 08/31/23   Rocky Morel, DO  DULoxetine (CYMBALTA) 60 MG capsule Take 1 capsule (60 mg total) by mouth daily. Patient not taking: Reported on 01/03/2021 04/22/13 01/04/21  Levert Feinstein, MD    Family History Family History  Problem Relation Age of Onset   Anemia Mother    High Cholesterol Mother    Diabetes Mother    Arthritis Mother    Diabetes type I Maternal Grandmother    Colon cancer Neg Hx    Esophageal cancer Neg Hx    Stomach cancer Neg Hx    Colon polyps Neg Hx    Breast cancer Neg Hx     Social History Social History   Tobacco Use   Smoking status: Never   Smokeless tobacco: Never  Substance Use Topics   Alcohol use: Not Currently    Comment: special occassions   Drug use: No     Allergies    Fentanyl, Other, and Wound dressing adhesive   Review of Systems Review of Systems  Constitutional:  Negative for chills and fever.  HENT:  Positive for congestion, ear pain, sinus pressure and sore throat.   Eyes:  Negative for pain and visual disturbance.  Respiratory:  Positive for cough. Negative for shortness of breath.   Cardiovascular:  Negative for chest pain and palpitations.  Gastrointestinal:  Negative for abdominal pain, constipation, diarrhea, nausea and vomiting.  Genitourinary:  Negative for dysuria and hematuria.  Musculoskeletal:  Positive for arthralgias. Negative for back pain.  Skin:  Negative for color change and rash.  Neurological:  Negative for seizures and syncope.  All other systems reviewed and are negative.    Physical Exam Triage Vital Signs ED Triage Vitals  Encounter Vitals Group     BP 08/31/23 1151 109/74     Systolic BP Percentile --      Diastolic BP Percentile --      Pulse Rate 08/31/23 1151 76     Resp 08/31/23 1151 20     Temp 08/31/23 1151 98 F (36.7 C)     Temp Source 08/31/23 1151 Oral     SpO2 08/31/23 1151 96 %     Weight --      Height --      Head Circumference --      Peak Flow --      Pain Score 08/31/23 1150 0     Pain Loc --      Pain Education --      Exclude from Growth Chart --    No data found.  Updated Vital Signs BP 109/74 (BP Location: Right Arm)   Pulse 76   Temp 98 F (36.7 C) (Oral)   Resp 20   SpO2 96%   Visual Acuity Right Eye Distance:   Left Eye Distance:   Bilateral Distance:    Right Eye Near:   Left Eye Near:    Bilateral Near:     Physical Exam Vitals and nursing note reviewed.  Constitutional:  General: She is not in acute distress.    Appearance: She is well-developed. She is ill-appearing. She is not toxic-appearing.  HENT:     Head: Normocephalic and atraumatic.     Right Ear: Hearing, tympanic membrane, ear canal and external ear normal.     Left Ear: Hearing, tympanic  membrane, ear canal and external ear normal.     Nose: Congestion and rhinorrhea present. Rhinorrhea is clear.     Right Sinus: No maxillary sinus tenderness or frontal sinus tenderness.     Left Sinus: No maxillary sinus tenderness or frontal sinus tenderness.     Mouth/Throat:     Lips: Pink.     Mouth: Mucous membranes are moist.     Pharynx: Uvula midline. No oropharyngeal exudate or posterior oropharyngeal erythema.     Tonsils: No tonsillar exudate.  Eyes:     Conjunctiva/sclera: Conjunctivae normal.     Pupils: Pupils are equal, round, and reactive to light.  Cardiovascular:     Rate and Rhythm: Normal rate and regular rhythm.     Heart sounds: S1 normal and S2 normal. No murmur heard. Pulmonary:     Effort: Pulmonary effort is normal. No respiratory distress.     Breath sounds: Examination of the right-upper field reveals decreased breath sounds. Examination of the left-upper field reveals decreased breath sounds. Examination of the right-middle field reveals decreased breath sounds. Examination of the left-middle field reveals decreased breath sounds. Examination of the right-lower field reveals decreased breath sounds. Examination of the left-lower field reveals decreased breath sounds. Decreased breath sounds present. No wheezing, rhonchi or rales.  Abdominal:     Palpations: Abdomen is soft.     Tenderness: There is generalized abdominal tenderness (Mild).  Musculoskeletal:        General: No swelling.     Cervical back: Neck supple.  Lymphadenopathy:     Head:     Right side of head: No submental, submandibular, tonsillar, preauricular or posterior auricular adenopathy.     Left side of head: No submental, submandibular, tonsillar, preauricular or posterior auricular adenopathy.     Cervical: No cervical adenopathy.     Right cervical: No superficial cervical adenopathy.    Left cervical: No superficial cervical adenopathy.  Skin:    General: Skin is warm and dry.      Capillary Refill: Capillary refill takes less than 2 seconds.     Findings: No rash.  Neurological:     Mental Status: She is alert and oriented to person, place, and time.  Psychiatric:        Mood and Affect: Mood normal.      UC Treatments / Results  Labs (all labs ordered are listed, but only abnormal results are displayed) Labs Reviewed - No data to display  EKG   Radiology No results found.  Procedures Procedures (including critical care time)  Medications Ordered in UC Medications  ipratropium-albuterol (DUONEB) 0.5-2.5 (3) MG/3ML nebulizer solution 3 mL (3 mLs Nebulization Given 08/31/23 1246)    Initial Impression / Assessment and Plan / UC Course  I have reviewed the triage vital signs and the nursing notes.  Pertinent labs & imaging results that were available during my care of the patient were reviewed by me and considered in my medical decision making (see chart for details).     Chest x-ray appears negative.  Will contact patient if radiology review provides a different interpretation that would change her plan of care.  Otherwise the results will  be available on her portal.  Due to the duration of her symptoms, COVID and flu testing is not valuable and would not change her plan of care.  Due to her diabetes, will use prednisone but lower dose and encouraged her to be vigilant about monitoring her blood glucose levels.  Prednisone, 20 mg, daily for 5 days.  DuoNeb treatment, via hand-held nebulizer, 1 vial, every 4-6 hours, as needed for wheezing.  Promethazine DM, 5 mL, every 6 hours as needed for cough.  Get plenty of fluids and rest.  Follow-up if symptoms do not improve, worsen or new symptoms occur.  Needs a recheck in 2 weeks with family doctor to make sure she got better and did not develop pneumonia.  Final Clinical Impressions(s) / UC Diagnoses   Final diagnoses:  Acute cough  Body aches  Acute viral bronchitis     Discharge Instructions       Chest x-ray appears negative for pneumonia.  Exam history and findings are most consistent with viral bronchitis.  Due to the duration of her symptoms, COVID and flu testing was not valuable and would not change the plan of care.  The patient does have diabetes.  We discussed use of prednisone for her bronchitis and I believe that is important.  But I cautioned her to monitor her glucose closely and watch for hyperglycemia.  She does use a continuous glucose monitor so that is helpful.  Prednisone, 20 mg, 1 daily for 5 days.  DuoNeb, 1 vial, through nebulizer machine, every 4-6 hours, as needed for wheezing or cough.  Promethazine DM, 5 mL or 1 teaspoon, every 6 hours, as needed for cough.  Follow-up if symptoms do not improve, worsen or new symptoms occur.  Needs a 2-week recheck with her family doctor.  Encouraged to call family doctor and make that appointment.     ED Prescriptions     Medication Sig Dispense Auth. Provider   ipratropium-albuterol (DUONEB) 0.5-2.5 (3) MG/3ML SOLN Take 3 mLs by nebulization every 4 (four) hours as needed (wheezing). 360 mL Prescilla Sours, FNP   promethazine-dextromethorphan (PROMETHAZINE-DM) 6.25-15 MG/5ML syrup Take 5 mLs by mouth 4 (four) times daily as needed. May make drowsy, do no use and drive. 118 mL Prescilla Sours, FNP   predniSONE (DELTASONE) 20 MG tablet Take 1 tablet (20 mg total) by mouth daily with breakfast for 5 days. 5 tablet Prescilla Sours, FNP      PDMP not reviewed this encounter.   Prescilla Sours, FNP 08/31/23 1318    Prescilla Sours, FNP 08/31/23 1322

## 2023-09-01 NOTE — Progress Notes (Signed)
Chest X-Ray IMPRESSION:  Right basilar scarring. No active cardiopulmonary disease.    Unable to reach the patient directly but I was able to leave a VM message.  Encouraged to return for re-evaluation if symptoms do not improve, resolve or if new symptoms occur.

## 2023-09-07 ENCOUNTER — Other Ambulatory Visit (HOSPITAL_COMMUNITY): Payer: Self-pay | Admitting: Internal Medicine

## 2023-09-10 ENCOUNTER — Telehealth (HOSPITAL_COMMUNITY): Payer: Self-pay | Admitting: Cardiology

## 2023-09-10 NOTE — Telephone Encounter (Signed)
[employer] called to request updated ICD 10 code. DOS 07/2022 code provided i47.40 is not a billable code  Returned call to update code to 147.20   [phone] ph#

## 2023-09-15 DIAGNOSIS — E1065 Type 1 diabetes mellitus with hyperglycemia: Secondary | ICD-10-CM | POA: Diagnosis not present

## 2023-09-15 DIAGNOSIS — E108 Type 1 diabetes mellitus with unspecified complications: Secondary | ICD-10-CM | POA: Diagnosis not present

## 2023-09-15 DIAGNOSIS — E1049 Type 1 diabetes mellitus with other diabetic neurological complication: Secondary | ICD-10-CM | POA: Diagnosis not present

## 2023-09-15 DIAGNOSIS — Z794 Long term (current) use of insulin: Secondary | ICD-10-CM | POA: Diagnosis not present

## 2023-09-15 DIAGNOSIS — E109 Type 1 diabetes mellitus without complications: Secondary | ICD-10-CM | POA: Diagnosis not present

## 2023-09-16 ENCOUNTER — Other Ambulatory Visit: Payer: Self-pay | Admitting: Student

## 2023-09-16 ENCOUNTER — Other Ambulatory Visit: Payer: Self-pay | Admitting: Gastroenterology

## 2023-10-19 ENCOUNTER — Other Ambulatory Visit: Payer: Self-pay

## 2023-10-19 MED ORDER — DEXCOM G7 SENSOR MISC: 3 refills | Status: DC

## 2023-10-19 NOTE — Telephone Encounter (Signed)
 Patient last seen 06/10/23, I called the patient to schedule a follow up appointment in order for [place] to continue to refill her medications, unable to reach her I left her a voicemail.

## 2023-10-22 ENCOUNTER — Telehealth: Payer: Self-pay | Admitting: Student

## 2023-10-22 ENCOUNTER — Other Ambulatory Visit: Payer: Self-pay

## 2023-10-22 ENCOUNTER — Ambulatory Visit (INDEPENDENT_AMBULATORY_CARE_PROVIDER_SITE_OTHER): Payer: Self-pay | Admitting: Internal Medicine

## 2023-10-22 ENCOUNTER — Encounter: Payer: Self-pay | Admitting: Internal Medicine

## 2023-10-22 VITALS — BP 120/67 | HR 87 | Temp 98.2°F | Ht 66.0 in | Wt 212.0 lb

## 2023-10-22 DIAGNOSIS — E109 Type 1 diabetes mellitus without complications: Secondary | ICD-10-CM | POA: Diagnosis not present

## 2023-10-22 DIAGNOSIS — F32A Depression, unspecified: Secondary | ICD-10-CM | POA: Diagnosis not present

## 2023-10-22 DIAGNOSIS — I509 Heart failure, unspecified: Secondary | ICD-10-CM

## 2023-10-22 DIAGNOSIS — F320 Major depressive disorder, single episode, mild: Secondary | ICD-10-CM

## 2023-10-22 DIAGNOSIS — Z1231 Encounter for screening mammogram for malignant neoplasm of breast: Secondary | ICD-10-CM

## 2023-10-22 DIAGNOSIS — I2585 Chronic coronary microvascular dysfunction: Secondary | ICD-10-CM

## 2023-10-22 DIAGNOSIS — M542 Cervicalgia: Secondary | ICD-10-CM | POA: Diagnosis not present

## 2023-10-22 DIAGNOSIS — E1059 Type 1 diabetes mellitus with other circulatory complications: Secondary | ICD-10-CM

## 2023-10-22 DIAGNOSIS — R0602 Shortness of breath: Secondary | ICD-10-CM

## 2023-10-22 DIAGNOSIS — F419 Anxiety disorder, unspecified: Secondary | ICD-10-CM

## 2023-10-22 DIAGNOSIS — I5032 Chronic diastolic (congestive) heart failure: Secondary | ICD-10-CM

## 2023-10-22 LAB — BRAIN NATRIURETIC PEPTIDE: B Natriuretic Peptide: 25.1 pg/mL (ref 0.0–100.0)

## 2023-10-22 LAB — POCT GLYCOSYLATED HEMOGLOBIN (HGB A1C): Hemoglobin A1C: 7 % — AB (ref 4.0–5.6)

## 2023-10-22 LAB — GLUCOSE, CAPILLARY: Glucose-Capillary: 111 mg/dL — ABNORMAL HIGH (ref 70–99)

## 2023-10-22 MED ORDER — FUROSEMIDE 40 MG PO TABS: 40.0000 mg | ORAL_TABLET | Freq: Two times a day (BID) | ORAL | 8 refills | Status: DC

## 2023-10-22 MED ORDER — DULOXETINE HCL 30 MG PO CPEP: 30.0000 mg | ORAL_CAPSULE | Freq: Every day | ORAL | 2 refills | Status: DC

## 2023-10-22 MED ORDER — FUROSEMIDE 40 MG PO TABS
40.0000 mg | ORAL_TABLET | Freq: Every day | ORAL | 8 refills | Status: AC | PRN
Start: 2023-10-22 — End: ?

## 2023-10-22 NOTE — Telephone Encounter (Signed)
 Pt is due for her Yearly Mammogram  in June   Unable to schedule per [facility] until a new order has been placed.   Last mammo-   MM 3D SCREENING MAMMOGRAM BILATERAL BREAST (Accession [id number]) (Order [id number]) Imaging Date: 01/02/2023 Department: [facility]

## 2023-10-22 NOTE — Patient Instructions (Addendum)
 Thank you, [name] for allowing [place] to provide your care today. Today we discussed .    Extra fluid I am checking your kidney labs today as extra fluid can cause a kidney injury.  For now continue taking medications as you have.  I will call tomorrow with instructions on next steps.  I think it would be best if we involve the heart failure team closely with adjusting your Lasix medications.  I agree with you that it seems like you have some extra fluid today.  I put another referral in for home sleep study  Diabetes remains well-controlled.  Please continue following up with your endocrinologist in May  Depression Please reach out to the therapist if you found this beneficial.  Studies have shown that therapy with medications is the most helpful.  I have stopped Lyrica since she did not have a benefit from this.  Please start taking Cymbalta 30 mg.  This potentially could help with your nerve pain and depression.   Please call the neurosurgery office to get follow-up about your neck. [facility] [place] Address: [address], [place], [place] [address] Phone: [phone]  I have ordered the following labs for you:  Lab Orders         Glucose, capillary         BMP8+Anion Gap         Brain natriuretic peptide         POC Hbg A1C       I have ordered the following medication/changed the following medications:   Stop the following medications: Medications Discontinued During This Encounter  Medication Reason   furosemide (LASIX) 40 MG tablet    pregabalin (LYRICA) 75 MG capsule    furosemide (LASIX) 40 MG tablet      Start the following medications: Meds ordered this encounter  Medications   DISCONTD: furosemide (LASIX) 40 MG tablet    Sig: Take 1 tablet (40 mg total) by mouth 2 (two) times daily.    Dispense:  60 tablet    Refill:  8   DULoxetine (CYMBALTA) 30 MG capsule    Sig:  Take 1 capsule (30 mg total) by mouth daily.    Dispense:  30 capsule    Refill:  2   furosemide (LASIX) 40 MG tablet    Sig: Take 1 tablet (40 mg total) by mouth daily as needed.    Dispense:  60 tablet    Refill:  8     Follow up:  1 week We look forward to seeing you next time. Please call our clinic at [phone] if you have any questions or concerns. The best time to call is Monday-Friday from 9am-4pm, but there is someone available 24/7. If after hours or the weekend, call the main hospital number and ask for the Internal Medicine Resident On-Call. If you need medication refills, please notify your pharmacy one week in advance and they will send [place] a request.   Thank you for trusting me with your care. Wishing you the best!   Rudene Christians, DO [facility]

## 2023-10-22 NOTE — Progress Notes (Addendum)
 Subjective:  CC: shortness of breath  HPI:  [name] is a 53 y.o. female with a past medical history of type 1 diabetes, CAD s/p PCI to mid Lcx 7/22, HFrEF EF 25-30% who presents today for several concerns.  Shortness of breath Since her last visit with Dr. Milas Kocher in the fall she reports feeling short of breath.  She now is not able to walk to the front of the clinic due to dyspnea or climb a flight of stairs without feeling she needs to stop.  Her weight is about 10 pounds up from last office visit at cardiology in December to 212 lbs. She has noted her pants feeling tighter but denies new lower extremity swelling. Her breathing feels the same when she is laying flat.  Dizziness Towards end of visit she noted dizziness that has been present for several months to years.  She reports that she is told every provider that she is interacted with that she has dizziness.  Dizziness occurs with standing but also at times whenever she is laying down.  She has not noticed that it is triggered with sudden movements of her head.  Depression   She is noted feeling down and had difficulty sleeping.  She has talked with behavioral therapy in the past and found this helpful.  She has resources through insurance if she wants to reach out for behavioral therapy.  She would be open to taking medications for depression as well.   Please see problem based assessment and plan for additional details.  Past Medical History:  Diagnosis Date   AC (acromioclavicular) joint bone spurs    Anemia    Anxiety    Arthritis    Bronchitis    Bulging lumbar disc    Bursitis    CHF (congestive heart failure) (HCC)    Complication of anesthesia    "has died 3 separate times under anesthesia"   Depression    Diabetes (HCC)    Environmental allergies    Fibromyalgia    High blood pressure    High cholesterol    History of COVID-19 01/2020   was hospitalized   History of endometriosis    History of  uterine fibroid    Migraines    Neuropathy    eyes , hands and feet    Pinched nerve    Pneumonia    Seizure (HCC)    after eye test only   Spondylosis    Tachycardia    after COVID   Tendonitis    Vision abnormalities     MEDICATIONS:  Insulin pump- novolog dexcom Amitriptyline 10 mg Cholestyramine 4 g Entresto 24-26 mg bid Vitamin d Spironolactone 12.5 mg Potassium 20 meq Dicyclomine 20 mg every 6 hours Lasix 40 mg PRN Metoprolol succinate 50 mg BID Lyrica 75 mg every day Atorvastatin 80 mg  Family History  Problem Relation Age of Onset   Anemia Mother    High Cholesterol Mother    Diabetes Mother    Arthritis Mother    Diabetes type I Maternal Grandmother    Colon cancer Neg Hx    Esophageal cancer Neg Hx    Stomach cancer Neg Hx    Colon polyps Neg Hx    Breast cancer Neg Hx     Past Surgical History:  Procedure Laterality Date   CORONARY STENT INTERVENTION N/A 02/19/2021   Procedure: CORONARY STENT INTERVENTION;  Surgeon: Corky Crafts, MD;  Location: [facility];  Service: Cardiovascular;  Laterality: N/A;   NASAL SINUS SURGERY     ORIF ANKLE FRACTURE Left 01/04/2021   Procedure: OPEN REDUCTION INTERNAL FIXATION (ORIF) LEFT BIMALLEOLAR ANKLE FRACTURE;  Surgeon: Kathryne Hitch, MD;  Location: [facility];  Service: Orthopedics;  Laterality: Left;   RIGHT/LEFT HEART CATH AND CORONARY ANGIOGRAPHY N/A 02/19/2021   Procedure: RIGHT/LEFT HEART CATH AND CORONARY ANGIOGRAPHY;  Surgeon: Corky Crafts, MD;  Location: [facility]VASIVE CV LAB;  Service: Cardiovascular;  Laterality: N/A;   RIGHT/LEFT HEART CATH AND CORONARY ANGIOGRAPHY N/A 09/20/2021   Procedure: RIGHT/LEFT HEART CATH AND CORONARY ANGIOGRAPHY;  Surgeon: Dolores Patty, MD;  Location: [facility];  Service: Cardiovascular;  Laterality: N/A;   TUBAL LIGATION     VAGINAL HYSTERECTOMY       Social History   Socioeconomic History   Marital status: Married    Spouse name:  Leory Plowman   Number of children: 3   Years of education: college   Highest education level: Not on file  Occupational History    Comment: Home maker  Tobacco Use   Smoking status: Never   Smokeless tobacco: Never  Vaping Use   Vaping status: Not on file  Substance and Sexual Activity   Alcohol use: Not Currently    Comment: special occassions   Drug use: No   Sexual activity: Not on file  Other Topics Concern   Not on file  Social History Narrative   Patient lives at home with her husband Leory Plowman. Worked previously in optometrist office, left work due to personnel issues. Patient has college education. Patient drinks 4-6 caffeine drinks daily.Right handed.   Social Drivers of Corporate investment banker Strain: Low Risk  (07/29/2022)   Overall Financial Resource Strain (CARDIA)    Difficulty of Paying Living Expenses: Not hard at all  Food Insecurity: No Food Insecurity (07/29/2022)   Hunger Vital Sign    Worried About Running Out of Food in the Last Year: Never true    Ran Out of Food in the Last Year: Never true  Transportation Needs: No Transportation Needs (07/29/2022)   PRAPARE - Administrator, Civil Service (Medical): No    Lack of Transportation (Non-Medical): No  Physical Activity: Not on file  Stress: Stress Concern Present (07/29/2022)   [employer] of Occupational Health - Occupational Stress Questionnaire    Feeling of Stress : Very much  Social Connections: Not on file  Intimate Partner Violence: Not At Risk (07/29/2022)   Humiliation, Afraid, Rape, and Kick questionnaire    Fear of Current or Ex-Partner: No    Emotionally Abused: No    Physically Abused: No    Sexually Abused: No    Review of Systems: ROS negative except for what is noted on the assessment and plan.  Objective:   Vitals:   10/22/23 0858  BP: 120/67  Pulse: 87  Temp: 98.2 F (36.8 C)  TempSrc: Oral  SpO2: 99%  Weight: 212 lb (96.2 kg)  Height: 5\' 6"  (1.676 m)   No data  found.      10/22/2023    9:00 AM 03/31/2023    1:22 PM 12/12/2022    2:45 PM  Depression screen PHQ 2/9  Decreased Interest 0 0 1  Down, Depressed, Hopeless 1 0 0  PHQ - 2 Score 1 0 1  Altered sleeping 3 1   Tired, decreased energy 3 3   Change in appetite 2 0   Feeling bad or failure about yourself  1 0   Trouble concentrating 0 1   Moving slowly or fidgety/restless 0 0   Suicidal thoughts 0 0   PHQ-9 Score 10 5   Difficult doing work/chores Not difficult at all  Not difficult at all     Physical Exam: Constitutional: well-appearing, in no acute distress Cardiovascular: regular rate and rhythm, no m/r/g Pulmonary/Chest: normal work of breathing on room air, lungs clear to auscultation bilaterally Abdominal: soft, non-tender, non-distended MSK: 1+ non pitting edema to left lower extremity Neurological: alert & oriented x 3,  normal gait Skin: extremities are warm and well perfused   Assessment & Plan:  DM (diabetes mellitus), type 1 (HCC) Last A1c was in November and she had decreased her A1c from 14 to 7%.  She follows with endocrinology and is using NovoLog and insulin pump. I am not able to review notes from endocrinology office.  She denies recent low blood sugars.  I will check A1c here as she says that her follow-up with endocrinology is not until May.  She ask about medications to help with weight loss.  Unsure if she would be a candidate for GLP-1's as a type I diabetic.  Encouraged her to ask her endocrinologist about this when she follows up in May.  She was seen by ophthalmology in January 2025, Dr. Sherryll Burger.  She did not feel like it was a good visit and would like to be referred to a different doctor.  She is also followed for retinitis pigmentosa. P: A1c remains well-controlled at 7.  Forward would not check A1c unless she has missed follow-up with endocrinology Follow-up with endocrinology Referral placed for ophthalmology  Heart failure with improved ejection  fraction (HFimpEF) (HCC) Last EF 35 to 40% in April 2024.  She remains on Entresto, metoprolol, spironolactone and Lasix as needed.  Heart cath done in 2023 showed severe diffuse disease.  Heart failure and cardiology have ordered home sleep studies but these have not been completed.  She reports feeling worsening dyspnea in the last several months.  Now she cannot climb a flight of stairs without feeling like she needs to stop or walk from the exam room to the front of the clinic.  She has not noticed worsening orthopnea.  Her weight is up by about 10 pounds since December.  She reports taking Lasix 40 mg daily and is adherent with other heart failure medications.  Her extremities are warm and well-perfused. A: Differentials include subacute heart failure exacerbation or deconditioning.  Additionally could be pulmonary cause although she does not have significant smoking history or cough. She reported dizziness on current medications.  Orthostatics were not positive but very close to being so. P: BNP at 25, not consistent with heart failure exacerbation.  BMP also with stable renal function.  Electrolytes within normal limits.   Home sleep study ordered.  Think she would benefit from closer follow-up with Dr. Gala Romney.  Question if she would be a good candidate for cardiac rehab if this is related to deconditioning.   For dizziness I am concerned GDMT is titrated to high.  I will call to encourage patient to follow-up with heart failure clinic soon.  In the time being I asked that she cut spironolactone in half and see if symptoms improve. Follow-up in one month  Depression PHQ-9 elevated at 10.  No suicidal thoughts.  She has talked with behavioral therapy in the past and thought it was helpful.  She has resources through her insurance to reach out  again.  He would be interested in trying medication for depression.  She was started on Lyrica in November for neck pain but has not found this helpful.   Will stop Lyrica and start Cymbalta.  Patient will need to follow-up in 4 to 6 weeks to see if medication is effective.  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  She was referred to neurosurgery at office visit in November.  She has not been contacted by neuro office.  She continues to have numbness and tingling in her upper extremities that feels about the same since the fall.  She remains interested in going to neurosurgery. P: Contact information provided in after visit summary   Patient discussed with Dr. Hurshel Keys Fernand Sorbello, D.O. [facility]  PGY-3 Pager: [phone]  Phone: [phone] Date 10/27/2023  Time 1:49 PM

## 2023-10-23 LAB — BMP8+ANION GAP
Anion Gap: 18 mmol/L (ref 10.0–18.0)
BUN/Creatinine Ratio: 20 (ref 9–23)
BUN: 16 mg/dL (ref 6–24)
CO2: 20 mmol/L (ref 20–29)
Calcium: 9.5 mg/dL (ref 8.7–10.2)
Chloride: 107 mmol/L — ABNORMAL HIGH (ref 96–106)
Creatinine, Ser: 0.8 mg/dL (ref 0.57–1.00)
Glucose: 100 mg/dL — ABNORMAL HIGH (ref 70–99)
Potassium: 4.7 mmol/L (ref 3.5–5.2)
Sodium: 145 mmol/L — ABNORMAL HIGH (ref 134–144)
eGFR: 88 mL/min/{1.73_m2} (ref >59)

## 2023-10-23 NOTE — Assessment & Plan Note (Signed)
 Last A1c was in November and she had decreased her A1c from 14 to 7%.  She follows with endocrinology and is using NovoLog and insulin pump. I am not able to review notes from endocrinology office.  She denies recent low blood sugars.  I will check A1c here as she says that her follow-up with endocrinology is not until May.  She ask about medications to help with weight loss.  Unsure if she would be a candidate for GLP-1's as a type I diabetic.  Encouraged her to ask her endocrinologist about this when she follows up in May.  She was seen by ophthalmology in January 2025, Dr. Sherryll Burger.  She did not feel like it was a good visit and would like to be referred to a different doctor.  She is also followed for retinitis pigmentosa. P: A1c remains well-controlled at 7.  Forward would not check A1c unless she has missed follow-up with endocrinology Follow-up with endocrinology Referral placed for ophthalmology

## 2023-10-23 NOTE — Assessment & Plan Note (Addendum)
 Last EF 35 to 40% in April 2024.  She remains on Entresto, metoprolol, spironolactone and Lasix as needed.  Heart cath done in 2023 showed severe diffuse disease.  Heart failure and cardiology have ordered home sleep studies but these have not been completed.  She reports feeling worsening dyspnea in the last several months.  Now she cannot climb a flight of stairs without feeling like she needs to stop or walk from the exam room to the front of the clinic.  She has not noticed worsening orthopnea.  Her weight is up by about 10 pounds since December.  She reports taking Lasix 40 mg daily and is adherent with other heart failure medications.  Her extremities are warm and well-perfused. A: Differentials include subacute heart failure exacerbation or deconditioning.  Additionally could be pulmonary cause although she does not have significant smoking history or cough. She reported dizziness on current medications.  Orthostatics were not positive but very close to being so. P: BNP at 25, not consistent with heart failure exacerbation.  BMP also with stable renal function.  Electrolytes within normal limits.   Home sleep study ordered.  Think she would benefit from closer follow-up with Dr. Gala Romney.  Question if she would be a good candidate for cardiac rehab if this is related to deconditioning.   For dizziness I am concerned GDMT is titrated to high.  I will call to encourage patient to follow-up with heart failure clinic soon.  In the time being I asked that she cut spironolactone in half and see if symptoms improve. Follow-up in one month

## 2023-10-23 NOTE — Progress Notes (Signed)
 Internal Medicine Clinic Attending  Case discussed with the resident at the time of the visit.  We reviewed the resident's history and exam and pertinent patient test results.  I agree with the assessment, diagnosis, and plan of care documented in the resident's note.

## 2023-10-23 NOTE — Assessment & Plan Note (Signed)
 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  She was referred to neurosurgery at office visit in November.  She has not been contacted by neuro office.  She continues to have numbness and tingling in her upper extremities that feels about the same since the fall.  She remains interested in going to neurosurgery. P: Contact information provided in after visit summary

## 2023-10-23 NOTE — Assessment & Plan Note (Signed)
 PHQ-9 elevated at 10.  No suicidal thoughts.  She has talked with behavioral therapy in the past and thought it was helpful.  She has resources through her insurance to reach out again.  He would be interested in trying medication for depression.  She was started on Lyrica in November for neck pain but has not found this helpful.  Will stop Lyrica and start Cymbalta.  Patient will need to follow-up in 4 to 6 weeks to see if medication is effective.

## 2023-10-27 ENCOUNTER — Encounter: Payer: Self-pay | Admitting: Internal Medicine

## 2023-10-27 ENCOUNTER — Ambulatory Visit: Payer: Self-pay

## 2023-10-27 NOTE — Telephone Encounter (Signed)
 Called [facility] and spoke with a Nurse Venita Sheffield about patient's refusal of the ER at this time.  This RN advised that I read the patient's lab results to her and she had no further questions--I also let them know I advised the patient that if anything changed or got worse for her to go to the Emergency Room for immediate medical attention or call 911 for an ambulance to take her to the Emergency Room. Patient had verbalized understanding.

## 2023-10-27 NOTE — Telephone Encounter (Addendum)
 Copied from CRM [id number]. Topic: Clinical - Red Word Triage >> Oct 27, 2023  3:44 PM [name] wrote: Red Word that prompted transfer to Nurse Triage: shortness of breath; fast heartbeat   Chief Complaint: Chest Pain Symptoms: pain Frequency: 3-4 minutes prior to triage with this RN---episodes since July 2022. Pertinent Negatives: Patient denies difficulty breathing, fever, dizziness, cough Disposition: [] ED /[] Urgent Care (no appt availability in office) / [] Appointment(In office/virtual)/ []  [facility]/ [] Home Care/ [x] Refused Recommended Disposition /[] [employer] [facility]/ []  Follow-up with PCP Additional Notes: Patient called and advised that she was calling to get her lab results because someone had called her about them earlier.  Patient then mentioned that she was actively having an episode of chest pain while on the phone with this RN. Patient states that she is having Chest Pain at this time stating that it feels sharp and deep. Patient states some episodes last between 5-10 minutes and she has a lot of episodes. Patient states diagnosed with heart failure in July of 2022 and has had these episodes where she feels short of breath and like her heart is beating fast ever since then. Patient states that she was just calling about her lab results. Lab results were read to patient verbatim from her chart where she was called earlier: "Amanda Frederick,   I have not been able to reach your by phone to review your blood work.    Your labs showed that you do not have fluid on you currently to explain why you are feeling more short of breath. Please continue heart failure medication at same dose and I recommend follow-up with heart failure team soon. Order was placed for sleep study last week. Please call [facility] [phone]) if you do not hear about getting that scheduled soon.   Please call if you have additional questions about your blood work.   Thank you, Dr. Sloan Leiter"  Patient  had no further questions about her lab work and she wrote down the phone number to be able to follow up about her sleep study if she isn't contact first to get that scheduled. Patient states that her chest feels a little achy and patient states that the actual pain is gone.   Patient is advised that with having the chest pain at this time with recent episodes the recommendation is that she goes to the Emergency Room.  Patient states she isn't going to do that at this time due to no changes since her last appointment with a provider and this is something they have been assessing for a while now.  Patient is advised though that if anything changes or worsens to go to the Emergency Room for immediate medical attention or call 911 for an ambulance if needed.  Patient verbalized understanding.    Reason for Disposition  [1] Chest pain lasts > 5 minutes AND [2] occurred in past 3 days (72 hours) (Exception: Feels exactly the same as previously diagnosed heartburn and has accompanying sour taste in mouth.)  Answer Assessment - Initial Assessment Questions 1. LOCATION: "Where does it hurt?"       Left sided chest pain at this exact moment 2. RADIATION: "Does the pain go anywhere else?" (e.g., into neck, jaw, arms, back)     Non radiating---felt deeper than normal 3. ONSET: "When did the chest pain begin?" (Minutes, hours or days)      5 minutes ago (about 3:45pm today 10/27/2023) 4. PATTERN: "Does the pain come and go, or  has it been constant since it started?"  "Does it get worse with exertion?"      Constant at first but now it is kind of easing off 5. DURATION: "How long does it last" (e.g., seconds, minutes, hours)     Somewhere between 5-10 minutes  it varies 6. SEVERITY: "How bad is the pain?"  (e.g., Scale 1-10; mild, moderate, or severe)    - MILD (1-3): doesn't interfere with normal activities     - MODERATE (4-7): interferes with normal activities or awakens from sleep    - SEVERE (8-10):  excruciating pain, unable to do any normal activities       2 7. CARDIAC RISK FACTORS: "Do you have any history of heart problems or risk factors for heart disease?" (e.g., angina, prior heart attack; diabetes, high blood pressure, high cholesterol, smoker, or strong family history of heart disease)     Heart failure 8. PULMONARY RISK FACTORS: "Do you have any history of lung disease?"  (e.g., blood clots in lung, asthma, emphysema, birth control pills)     No 9. CAUSE: "What do you think is causing the chest pain?"     unsure 10. OTHER SYMPTOMS: "Do you have any other symptoms?" (e.g., dizziness, nausea, vomiting, sweating, fever, difficulty breathing, cough)       Sweating 11. PREGNANCY: "Is there any chance you are pregnant?" "When was your last menstrual period?"       No  Protocols used: Chest Pain-A-AH

## 2023-10-27 NOTE — Addendum Note (Signed)
 Addended by: Lucille Passy on: 10/27/2023 08:55 AM   Modules accepted: Orders

## 2023-10-29 NOTE — Telephone Encounter (Addendum)
 Call to patient t schedule Nurse Visit appointment for next week.  Patent to come in on 11/02/2023.To bring B/P home Monitor and have Dexcom downloaded.

## 2023-10-30 ENCOUNTER — Telehealth (HOSPITAL_COMMUNITY): Payer: Self-pay

## 2023-10-30 NOTE — Telephone Encounter (Signed)
 Called to confirm/remind patient of their appointment at [facility] on 11/02/2023 11:00.   Appointment:   [] Confirmed  [x] Left mess   [] No answer/No voice mail  [] Phone not in service  Patient reminded to bring all medications and/or complete list.  Confirmed patient has transportation. Gave directions, instructed to utilize valet parking.

## 2023-11-02 ENCOUNTER — Ambulatory Visit

## 2023-11-02 ENCOUNTER — Ambulatory Visit (HOSPITAL_COMMUNITY)
Admission: RE | Admit: 2023-11-02 | Discharge: 2023-11-02 | Disposition: A | Discharge status: Home / Self Care | Source: Ambulatory Visit | Attending: Physician Assistant | Admitting: Physician Assistant

## 2023-11-02 ENCOUNTER — Encounter (HOSPITAL_COMMUNITY): Payer: Self-pay | Admitting: Physician Assistant

## 2023-11-02 VITALS — BP 128/88 | HR 86 | Ht 66.0 in | Wt 214.6 lb

## 2023-11-02 DIAGNOSIS — Z794 Long term (current) use of insulin: Secondary | ICD-10-CM | POA: Diagnosis not present

## 2023-11-02 DIAGNOSIS — I5022 Chronic systolic (congestive) heart failure: Secondary | ICD-10-CM | POA: Diagnosis not present

## 2023-11-02 DIAGNOSIS — Z955 Presence of coronary angioplasty implant and graft: Secondary | ICD-10-CM | POA: Insufficient documentation

## 2023-11-02 DIAGNOSIS — Z7902 Long term (current) use of antithrombotics/antiplatelets: Secondary | ICD-10-CM | POA: Diagnosis not present

## 2023-11-02 DIAGNOSIS — R0683 Snoring: Secondary | ICD-10-CM | POA: Diagnosis not present

## 2023-11-02 DIAGNOSIS — I5032 Chronic diastolic (congestive) heart failure: Secondary | ICD-10-CM | POA: Diagnosis not present

## 2023-11-02 DIAGNOSIS — K529 Noninfective gastroenteritis and colitis, unspecified: Secondary | ICD-10-CM | POA: Diagnosis not present

## 2023-11-02 DIAGNOSIS — R0601 Orthopnea: Secondary | ICD-10-CM | POA: Insufficient documentation

## 2023-11-02 DIAGNOSIS — M7989 Other specified soft tissue disorders: Secondary | ICD-10-CM | POA: Insufficient documentation

## 2023-11-02 DIAGNOSIS — R42 Dizziness and giddiness: Secondary | ICD-10-CM | POA: Insufficient documentation

## 2023-11-02 DIAGNOSIS — E785 Hyperlipidemia, unspecified: Secondary | ICD-10-CM | POA: Diagnosis not present

## 2023-11-02 DIAGNOSIS — E782 Mixed hyperlipidemia: Secondary | ICD-10-CM

## 2023-11-02 DIAGNOSIS — Z8616 Personal history of COVID-19: Secondary | ICD-10-CM | POA: Insufficient documentation

## 2023-11-02 DIAGNOSIS — R002 Palpitations: Secondary | ICD-10-CM | POA: Diagnosis not present

## 2023-11-02 DIAGNOSIS — Z9641 Presence of insulin pump (external) (internal): Secondary | ICD-10-CM | POA: Insufficient documentation

## 2023-11-02 DIAGNOSIS — I110 Hypertensive heart disease with heart failure: Secondary | ICD-10-CM | POA: Diagnosis not present

## 2023-11-02 DIAGNOSIS — I252 Old myocardial infarction: Secondary | ICD-10-CM | POA: Insufficient documentation

## 2023-11-02 DIAGNOSIS — E1040 Type 1 diabetes mellitus with diabetic neuropathy, unspecified: Secondary | ICD-10-CM | POA: Insufficient documentation

## 2023-11-02 DIAGNOSIS — Z79899 Other long term (current) drug therapy: Secondary | ICD-10-CM | POA: Diagnosis not present

## 2023-11-02 DIAGNOSIS — M797 Fibromyalgia: Secondary | ICD-10-CM | POA: Diagnosis not present

## 2023-11-02 DIAGNOSIS — I255 Ischemic cardiomyopathy: Secondary | ICD-10-CM | POA: Diagnosis not present

## 2023-11-02 DIAGNOSIS — I25118 Atherosclerotic heart disease of native coronary artery with other forms of angina pectoris: Secondary | ICD-10-CM | POA: Insufficient documentation

## 2023-11-02 DIAGNOSIS — G8929 Other chronic pain: Secondary | ICD-10-CM | POA: Diagnosis not present

## 2023-11-02 MED ORDER — ENTRESTO 49-51 MG PO TABS
1.0000 | ORAL_TABLET | Freq: Two times a day (BID) | ORAL | 3 refills | Status: DC
Start: 2023-11-02 — End: 2023-12-03

## 2023-11-02 NOTE — Progress Notes (Addendum)
 [facility] Note    PCP: Rocky Morel, DO Primary Cardiologist: Parke Poisson, MD  HF Cardiologist: Dr. Gala Romney  HPI: Amanda Frederick is a 53 y.o. female with history of DM1, HTN, HLD, hx fibromyalgia, neuropathy, hx COVID-19 infection 02/2020.   Reports gradually progressing dyspnea with exertion for a couple of years. Felt horrible after COVID and dyspnea never really improved. Underwent ORIF for ankle fracture in June 2022. Initially did okay post-op. Subsequently developed LE edema, dyspnea and orthopnea. She was later admitted July 2022 with acute systolic CHF.   Echo 7/22: EF 25-30%, RV ok, large left pleural effusion, mild MR. Diuresed with IV lasix. Underwent thoracentesis for L pleural effusion.  R/LHC 7/22:  50% OM1, 50% OM2, 50% D2, 50% mid to distal LAD, 90% mid Lcx treated with PCI/DES RA 6 PA 34/15 (23) PCWP 9 mmHg Fick CO 5.02 Fick CI 2.54 PA sat 75%  Echo 11/22: EF 30-35%, septal and peri-apical severe hypokinesis, RV okay, mild MR  Seen for initial HF clinic visit 08/07/21. Noted ongoing dyspnea with exertion. Volume looked okay. ReDS 28%. Started on spiro.   Echo 09/16/21: EF 35-40%, RV okay, mild MR  cMRI 09/16/21: LVEF 43%, RVEF 49%, delayed enhancement showing myocardial infarction-pattern scarring consistent with substantial LAD territory MI.  Repeat cath 09/20/21 showed progression of LAD disease. Now with diffuse diabetic CAD in mid to distal LAD corresponding to infarct seen on cMRI. Reviewed with IC team. No target for intervention particularly given lack of viability on MRI. EF 35-45%   Ao = 133/79 (104) LV = 124/15 RA =  1 RV = 30/6 PA = 29/8 (17) PCW = 10 Fick cardiac output/index = 6.1/3.1 PVR = 0.9 WU Ao sat = 99% PA sat = 81%, 82% High SVC = 79%  Echo 4/24 showed 35-40%, RV ok.  She's had chronic intermittent chest pain since 2022.   She is here today for HF follow-up. Reports chronic chest pain that has not changed  in nature. Notes several different types of pain. One type is sharp needlelike jabs and is fleeting, the second is a deep pressure that may last anywhere from seconds to 5-10 minutes. The third is intense ache. All pains occur on left side. Pain can occur at rest or with exertion. Aslo describes sensation of heart turning over in chest. BP has been up to 180 systolic during episodes. 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 Got winded mopping floor in kitchen this weekend and had to take breaks. Has had chronic orthopnea X several years. Intermittent leg swelling. Weight is up 12 lb from last clinic visit. Compliant with all medications. Reports chronic dizziness, even after GDMT previously scaled back. Can occur sitting or upon standing. BP rarely less than 120 systolic, even during episodes of dizziness.  Review of Systems:  Cardiac and Respiratory - Negative except as mentioned in HPI.  Past Medical History:  Diagnosis Date   AC (acromioclavicular) joint bone spurs    Anemia    Anxiety    Arthritis    Bronchitis    Bulging lumbar disc    Bursitis    CHF (congestive heart failure) (HCC)    Complication of anesthesia    "has died 3 separate times under anesthesia"   Depression    Diabetes (HCC)    Environmental allergies    Fibromyalgia    High blood pressure    High cholesterol  History of COVID-19 01/2020   was hospitalized   History of endometriosis    History of uterine fibroid    Migraines    Neuropathy    eyes , hands and feet    Pinched nerve    Pneumonia    Seizure (HCC)    after eye test only   Spondylosis    Tachycardia    after COVID   Tendonitis    Vision abnormalities    Current Outpatient Medications  Medication Sig Dispense Refill   amitriptyline (ELAVIL) 10 MG tablet TAKE 1 TABLET BY MOUTH EVERYDAY AT BEDTIME 90 tablet 4   atorvastatin (LIPITOR) 80 MG tablet TAKE 1 TABLET BY MOUTH EVERY DAY 90  tablet 3   cholecalciferol (VITAMIN D) 1000 UNITS tablet Take 1,000 Units by mouth daily.     clopidogrel (PLAVIX) 75 MG tablet TAKE 1 TABLET BY MOUTH EVERY DAY 30 tablet 11   Continuous Blood Gluc Receiver (DEXCOM G7 RECEIVER) DEVI 1 each by Does not apply route as needed. 1 each 0   Continuous Glucose Sensor (DEXCOM G7 SENSOR) MISC Use to monitor blood sugar as directed 9 each 3   dicyclomine (BENTYL) 20 MG tablet TAKE 1 TABLET BY MOUTH EVERY 6 HOURS 120 tablet 5   DULoxetine (CYMBALTA) 30 MG capsule Take 1 capsule (30 mg total) by mouth daily. 30 capsule 2   ENTRESTO 24-26 MG TAKE 1 TABLET BY MOUTH TWICE A DAY 60 tablet 8   fluticasone (FLONASE) 50 MCG/ACT nasal spray INSTILL 1 SPRAY INTO BOTH NOSTRILS DAILY 48 mL 2   furosemide (LASIX) 40 MG tablet Take 1 tablet (40 mg total) by mouth daily as needed. 60 tablet 8   glucose blood (BAYER CONTOUR NEXT TEST) test strip Test 8-10 times per day 8-10 lancets/day 300 each 11   Insulin Infusion Pump (T:SLIM X2 CONTROL-IQ PUMP) DEVI 1 each by Does not apply route as needed. 1 each 0   Insulin Infusion Pump Supplies (PARADIGM QUICK-SET 32" ) MISC Use with novolog in insulin pump 40 each 3   Insulin Infusion Pump Supplies (T:SLIM X2 CARTRIDGE) MISC Use with insulin pump 30 each 4   Insulin Infusion Pump Supplies (T:SLIM X2/CONTROL-IQ/ACC/INSTR) MISC Use with insulin pump 1 each 1   loperamide (IMODIUM) 2 MG capsule Take 1 capsule (2 mg total) by mouth as needed for diarrhea or loose stools. Take 2 mg after every loose bowel movement with a maximum of 16 mg a day (maximum of 8 pills a day) 90 capsule 2   Magnesium 250 MG TABS Take 250 mg by mouth daily.     metoprolol succinate (TOPROL-XL) 50 MG 24 hr tablet TAKE 1 TABLET BY MOUTH TWICE A DAY 180 tablet 3   Misc Natural Products (BEET ROOT PO) Take 500 mg by mouth daily.     Multiple Vitamin (MULTIVITAMIN) capsule Take 1 capsule by mouth daily.     NOVOLOG 100 UNIT/ML injection TAKE UP TO 200 UNITS  PER DAY VIA THE PUMP AS INSTRUCTED 60 mL 11   potassium chloride SA (KLOR-CON M20) 20 MEQ tablet TAKE 1 TABLET BY MOUTH EVERY DAY 90 tablet 3   spironolactone (ALDACTONE) 25 MG tablet TAKE 0.5 TABLETS (12.5 MG TOTAL) BY MOUTH DAILY. NEEDS FOLLOW UP APPOINTMENT FOR ANYMORE REFILLS 45 tablet 1   vitamin B-12 (CYANOCOBALAMIN) 500 MCG tablet Take 500 mcg by mouth daily.     cholestyramine (QUESTRAN) 4 g packet MIX AND TAKE 1 PACKET (4 G TOTAL) BY MOUTH EVERY  12 (TWELVE) HOURS AS NEEDED. (Patient not taking: Reported on 11/02/2023) 180 packet 2   ipratropium-albuterol (DUONEB) 0.5-2.5 (3) MG/3ML SOLN Take 3 mLs by nebulization every 4 (four) hours as needed (wheezing). (Patient not taking: Reported on 11/02/2023) 360 mL 0   promethazine-dextromethorphan (PROMETHAZINE-DM) 6.25-15 MG/5ML syrup Take 5 mLs by mouth 4 (four) times daily as needed. May make drowsy, do no use and drive. (Patient not taking: Reported on 11/02/2023) 118 mL 0   No current facility-administered medications for this encounter.   Facility-Administered Medications Ordered in Other Encounters  Medication Dose Route Frequency Provider Last Rate Last Admin   sodium chloride flush (NS) 0.9 % injection 3 mL  3 mL Intravenous Q12H Bensimhon, Bevelyn Buckles, MD       Allergies  Allergen Reactions   Fentanyl Nausea And Vomiting   Other Hives    Antibiotic specific one unsure but has tolerated antibiotic recently. Reaction was about 20 years ago.   Wound Dressing Adhesive Other (See Comments)    Blisters   Social History   Socioeconomic History   Marital status: Married    Spouse name: Leory Plowman   Number of children: 3   Years of education: college   Highest education level: Not on file  Occupational History    Comment: Home maker  Tobacco Use   Smoking status: Never   Smokeless tobacco: Never  Vaping Use   Vaping status: Not on file  Substance and Sexual Activity   Alcohol use: Not Currently    Comment: special occassions   Drug use:  No   Sexual activity: Not on file  Other Topics Concern   Not on file  Social History Narrative   Patient lives at home with her husband Leory Plowman. Worked previously in optometrist office, left work due to personnel issues. Patient has college education. Patient drinks 4-6 caffeine drinks daily.Right handed.   Social Drivers of Corporate investment banker Strain: Low Risk  (07/29/2022)   Overall Financial Resource Strain (CARDIA)    Difficulty of Paying Living Expenses: Not hard at all  Food Insecurity: No Food Insecurity (07/29/2022)   Hunger Vital Sign    Worried About Running Out of Food in the Last Year: Never true    Ran Out of Food in the Last Year: Never true  Transportation Needs: No Transportation Needs (07/29/2022)   PRAPARE - Administrator, Civil Service (Medical): No    Lack of Transportation (Non-Medical): No  Physical Activity: Not on file  Stress: Stress Concern Present (07/29/2022)   [employer] of Occupational Health - Occupational Stress Questionnaire    Feeling of Stress : Very much  Social Connections: Not on file  Intimate Partner Violence: Not At Risk (07/29/2022)   Humiliation, Afraid, Rape, and Kick questionnaire    Fear of Current or Ex-Partner: No    Emotionally Abused: No    Physically Abused: No    Sexually Abused: No   Family History  Problem Relation Age of Onset   Anemia Mother    High Cholesterol Mother    Diabetes Mother    Arthritis Mother    Diabetes type I Maternal Grandmother    Colon cancer Neg Hx    Esophageal cancer Neg Hx    Stomach cancer Neg Hx    Colon polyps Neg Hx    Breast cancer Neg Hx    BP 128/88 (BP Location: Right Arm, Patient Position: Sitting, Cuff Size: Normal)   Pulse 86   Ht 5'  6" (1.676 m)   Wt 97.3 kg (214 lb 9.6 oz)   SpO2 96%   BMI 34.64 kg/m   BP lying 158/98 pulse 83 BP sitting 144/96 pulse 81 SP standing 180/118 pulse 85  Wt Readings from Last 3 Encounters:  11/02/23 97.3 kg (214 lb 9.6 oz)   10/22/23 96.2 kg (212 lb)  06/30/23 92.5 kg (204 lb)   PHYSICAL EXAM: General:  Well appearing. Ambulated into clinic. Neck: no JVD.  Cor: Regular rate & rhythm. No rubs, gallops or murmurs. Lungs: clear Abdomen: obese, soft, nontender, nondistended.  Extremities: no edema Neuro: alert & orientedx3. Affect pleasant   ReDs: 30%  ASSESSMENT & PLAN: Chronic systolic CHF/ICM: - Echo (7/22): EF 25-30%, RV okay - R/LHC (7/22): RA 6, PA mean 23, PCWP 9, Fick CI 2.54. 90% Lcx treated with PCI/DES, diffuse disease marginal branches, LAD and diagonal - Echo (11/22): EF 30-35% RV okay - Echo (2/23): EF 35-40%, RV okay, mild MR - cMRI (2/23): LVEF 43%, RVEF 49%, delayed enhancement suggestive of substantial LAD territory MI. - Cath 2/23 EF 35-40% showed progression of LAD disease. Now with diffuse diabetic CAD in mid to distal LAD corresponding to infarct seen on cMRI. Reviewed with IC team. Lcx stent patent. No target for intervention particularly given lack of viability on MRI. RHC ok  - Echo 4/24 showed 35-40%, RV ok - NYHA III. Volume okay on exam and by ReDS which is 30%. Weight is up 12 lb but does not appear to be d/t volume. - Continue Lasix 40 mg daily, OK to take extra PRN (discussed when to do this) - BP not optimally controlled. Increase Entresto to 49/51 mg BID - Continue Toprol XL 50 mg bid - Continue spiro 12.5 mg daily. - No SGLT2i with Type I DM - BMET/BNP 03/27 were stable. Repeat labs in 1-2 weeks. - Repeat echo already scheduled same day as f/u 05/08.  2. CAD: - s/p PCI/DES mid Lcx 7/22.  - cMRI with delayed enhancement suggestive of LAD territory myocardial infarction. Dr. Gala Romney reviewed prior cath images from 07/22 personally. There is 70-80% diffuse disease in mid to distal LAD (? Event at some point with acute occlusion and subsequent recannulation) - Cath 2/23 as above; continue Medical therapy  - Hx stable angina. Also has some episodes of CP that are very  atypical for angina. ? How much uncontrolled HTN may be contributing. BP medications adjusted as above. Discussed home monitoring of blood pressure. - Off ASA. Now on plavix as single antiplatelet agent. - Continue atorvastatin 80. - Now off Imdur with labile BP - would benefit from CR, have not been able to coordinate in past due to scheduling conflicts.   3. Palpitations - Zio 1/24 4 runs NSVT. 1 run SVT - Zio 11/24 7 runs NSVT longest 6 beats and 4 runs SVT longest 17 beats - Continue metoprolol - Needs sleep study.   4. Hyperlipidemia - On high-intensity statin - Managed by primary cardiology team - Last LDL was 59  5. Type I DM: - Has insulin pump - A1c 8.4 -> 12-->14-->7.0 - Followed by Endocrine  6. Snoring - In-lab sleep study ordered. Has not been contacted to schedule. PCP recently resubmitted referral.   7. Chronic diarrhea - Followed by GI  Follow up As scheduled in May with echo  Rico Massar N, PA-C  11:46 AM

## 2023-11-02 NOTE — Telephone Encounter (Signed)
error 

## 2023-11-02 NOTE — Progress Notes (Signed)
 ReDS Vest / Clip - 11/02/23 1200       ReDS Vest / Clip   Station Marker D    Ruler Value 32    ReDS Value Range Low volume    ReDS Actual Value 30

## 2023-11-02 NOTE — Patient Instructions (Signed)
 Medication Changes:  INCREASE ENTRESTO TO 49/51MG  TWICE DAILY   Lab Work:  RETURN FOR LABS IN 2 WEEKS AS SCHEDULED   Follow-Up in: AS SCHEDULED IN MAY   At the [facility], you and your health needs are our priority. We have a designated team specialized in the treatment of Heart Failure. This Care Team includes your primary Heart Failure Specialized Cardiologist (physician), Advanced Practice Providers (APPs- Physician Assistants and Nurse Practitioners), and Pharmacist who all work together to provide you with the care you need, when you need it.   You may see any of the following providers on your designated Care Team at your next follow up:  Dr. Arvilla Meres Dr. Marca Ancona Dr. Dorthula Nettles Dr. Theresia Bough Tonye Becket, NP Robbie Lis, [place] [facility] [place], [place] Brynda Peon, NP [place] Lee, NP Karle Plumber, PharmD   Please be sure to bring in all your medications bottles to every appointment.   Need to Contact us:  If you have any questions or concerns before your next appointment please send [place] a message through [place] or call our office at [phone].    TO LEAVE A MESSAGE FOR THE NURSE SELECT OPTION 2, PLEASE LEAVE A MESSAGE INCLUDING: YOUR NAME DATE OF BIRTH CALL BACK NUMBER REASON FOR CALL**this is important as we prioritize the call backs  YOU WILL RECEIVE A CALL BACK THE SAME DAY AS LONG AS YOU CALL BEFORE 4:00 PM

## 2023-11-02 NOTE — Addendum Note (Signed)
 Encounter addended by: Andrey Farmer, PA-C on: 11/02/2023 2:24 PM  Actions taken: Clinical Note Signed

## 2023-11-10 ENCOUNTER — Other Ambulatory Visit: Payer: Self-pay | Admitting: Gastroenterology

## 2023-11-16 ENCOUNTER — Ambulatory Visit (HOSPITAL_COMMUNITY)
Admission: RE | Admit: 2023-11-16 | Discharge: 2023-11-16 | Disposition: A | Discharge status: Home / Self Care | Source: Ambulatory Visit | Attending: Cardiology | Admitting: Cardiology

## 2023-11-16 DIAGNOSIS — I509 Heart failure, unspecified: Secondary | ICD-10-CM | POA: Insufficient documentation

## 2023-11-16 DIAGNOSIS — I255 Ischemic cardiomyopathy: Secondary | ICD-10-CM | POA: Diagnosis present

## 2023-11-16 LAB — BASIC METABOLIC PANEL WITH GFR
Anion gap: 11 (ref 5–15)
BUN: 22 mg/dL — ABNORMAL HIGH (ref 6–20)
CO2: 21 mmol/L — ABNORMAL LOW (ref 22–32)
Calcium: 8.9 mg/dL (ref 8.9–10.3)
Chloride: 105 mmol/L (ref 98–111)
Creatinine, Ser: 0.89 mg/dL (ref 0.44–1.00)
GFR, Estimated: >60 mL/min (ref >60)
Glucose, Bld: 229 mg/dL — ABNORMAL HIGH (ref 70–99)
Potassium: 4 mmol/L (ref 3.5–5.1)
Sodium: 137 mmol/L (ref 135–145)

## 2023-11-16 LAB — BRAIN NATRIURETIC PEPTIDE: B Natriuretic Peptide: 25.5 pg/mL (ref 0.0–100.0)

## 2023-11-17 ENCOUNTER — Other Ambulatory Visit: Payer: Self-pay | Admitting: Internal Medicine

## 2023-11-17 DIAGNOSIS — F419 Anxiety disorder, unspecified: Secondary | ICD-10-CM

## 2023-11-19 ENCOUNTER — Other Ambulatory Visit: Payer: Self-pay | Admitting: Gastroenterology

## 2023-12-02 ENCOUNTER — Telehealth (HOSPITAL_COMMUNITY): Payer: Self-pay | Admitting: Internal Medicine

## 2023-12-02 NOTE — Telephone Encounter (Signed)
 Called to confirm/remind patient of their appointment at the [facility] on 12/02/2023.   Appointment:   [] Confirmed  [x] Left mess   [] No answer/No voice mail  [] VM Full/unable to leave message  [] Phone not in service  Patient reminded to bring all medications and/or complete list.  Confirmed patient has transportation. Gave directions, instructed to utilize valet parking.

## 2023-12-03 ENCOUNTER — Ambulatory Visit (HOSPITAL_COMMUNITY)
Admission: RE | Admit: 2023-12-03 | Discharge: 2023-12-03 | Disposition: A | Discharge status: Home / Self Care | Source: Ambulatory Visit | Attending: Internal Medicine | Admitting: Internal Medicine

## 2023-12-03 ENCOUNTER — Other Ambulatory Visit: Payer: Self-pay | Admitting: Gastroenterology

## 2023-12-03 ENCOUNTER — Ambulatory Visit (INDEPENDENT_AMBULATORY_CARE_PROVIDER_SITE_OTHER): Admitting: Neurology

## 2023-12-03 ENCOUNTER — Ambulatory Visit (HOSPITAL_BASED_OUTPATIENT_CLINIC_OR_DEPARTMENT_OTHER)
Admission: RE | Admit: 2023-12-03 | Discharge: 2023-12-03 | Disposition: A | Discharge status: Home / Self Care | Source: Ambulatory Visit | Attending: Internal Medicine | Admitting: Internal Medicine

## 2023-12-03 VITALS — BP 125/85 | HR 81 | Ht 66.0 in | Wt 216.4 lb

## 2023-12-03 VITALS — BP 136/88 | HR 75 | Wt 215.8 lb

## 2023-12-03 DIAGNOSIS — R079 Chest pain, unspecified: Secondary | ICD-10-CM | POA: Insufficient documentation

## 2023-12-03 DIAGNOSIS — R002 Palpitations: Secondary | ICD-10-CM | POA: Diagnosis not present

## 2023-12-03 DIAGNOSIS — I2510 Atherosclerotic heart disease of native coronary artery without angina pectoris: Secondary | ICD-10-CM | POA: Insufficient documentation

## 2023-12-03 DIAGNOSIS — E782 Mixed hyperlipidemia: Secondary | ICD-10-CM | POA: Diagnosis not present

## 2023-12-03 DIAGNOSIS — E66811 Obesity, class 1: Secondary | ICD-10-CM

## 2023-12-03 DIAGNOSIS — I5022 Chronic systolic (congestive) heart failure: Secondary | ICD-10-CM | POA: Insufficient documentation

## 2023-12-03 DIAGNOSIS — Z79899 Other long term (current) drug therapy: Secondary | ICD-10-CM | POA: Diagnosis not present

## 2023-12-03 DIAGNOSIS — Z9641 Presence of insulin pump (external) (internal): Secondary | ICD-10-CM | POA: Insufficient documentation

## 2023-12-03 DIAGNOSIS — Z8616 Personal history of COVID-19: Secondary | ICD-10-CM | POA: Diagnosis not present

## 2023-12-03 DIAGNOSIS — G4700 Insomnia, unspecified: Secondary | ICD-10-CM | POA: Diagnosis not present

## 2023-12-03 DIAGNOSIS — R519 Headache, unspecified: Secondary | ICD-10-CM

## 2023-12-03 DIAGNOSIS — F32A Depression, unspecified: Secondary | ICD-10-CM | POA: Insufficient documentation

## 2023-12-03 DIAGNOSIS — Z9189 Other specified personal risk factors, not elsewhere classified: Secondary | ICD-10-CM | POA: Diagnosis not present

## 2023-12-03 DIAGNOSIS — G4719 Other hypersomnia: Secondary | ICD-10-CM | POA: Diagnosis not present

## 2023-12-03 DIAGNOSIS — Z955 Presence of coronary angioplasty implant and graft: Secondary | ICD-10-CM

## 2023-12-03 DIAGNOSIS — I081 Rheumatic disorders of both mitral and tricuspid valves: Secondary | ICD-10-CM | POA: Insufficient documentation

## 2023-12-03 DIAGNOSIS — I252 Old myocardial infarction: Secondary | ICD-10-CM | POA: Insufficient documentation

## 2023-12-03 DIAGNOSIS — R351 Nocturia: Secondary | ICD-10-CM | POA: Diagnosis not present

## 2023-12-03 DIAGNOSIS — Z794 Long term (current) use of insulin: Secondary | ICD-10-CM | POA: Diagnosis not present

## 2023-12-03 DIAGNOSIS — E785 Hyperlipidemia, unspecified: Secondary | ICD-10-CM | POA: Insufficient documentation

## 2023-12-03 DIAGNOSIS — Z7902 Long term (current) use of antithrombotics/antiplatelets: Secondary | ICD-10-CM | POA: Insufficient documentation

## 2023-12-03 DIAGNOSIS — E1040 Type 1 diabetes mellitus with diabetic neuropathy, unspecified: Secondary | ICD-10-CM | POA: Insufficient documentation

## 2023-12-03 DIAGNOSIS — E109 Type 1 diabetes mellitus without complications: Secondary | ICD-10-CM | POA: Diagnosis not present

## 2023-12-03 DIAGNOSIS — M797 Fibromyalgia: Secondary | ICD-10-CM | POA: Insufficient documentation

## 2023-12-03 DIAGNOSIS — R0683 Snoring: Secondary | ICD-10-CM

## 2023-12-03 DIAGNOSIS — I110 Hypertensive heart disease with heart failure: Secondary | ICD-10-CM | POA: Diagnosis not present

## 2023-12-03 DIAGNOSIS — I10 Essential (primary) hypertension: Secondary | ICD-10-CM

## 2023-12-03 LAB — ECHOCARDIOGRAM COMPLETE
Area-P 1/2: 4.68 cm2
Calc EF: 45.2 %
S' Lateral: 3.6 cm
Single Plane A2C EF: 45.3 %
Single Plane A4C EF: 45.9 %

## 2023-12-03 MED ORDER — ENTRESTO 97-103 MG PO TABS: 1.0000 | ORAL_TABLET | Freq: Two times a day (BID) | ORAL | 5 refills | Status: DC

## 2023-12-03 NOTE — Addendum Note (Signed)
 Encounter addended by: Bentlee Drier B, RN on: 12/03/2023 10:11 AM  Actions taken: Clinical Note Signed, Order list changed

## 2023-12-03 NOTE — Patient Instructions (Signed)
 Medication Changes:  INCREASE ENTRESTO  TO 97/103MG  TWICE DAILY   Follow-Up in: 6 MONTHS PLEASE CALL OUR OFFICE AROUND SEPTEMBER TO GET SCHEDULED FOR YOUR APPOINTMENT. PHONE NUMBER IS [phone] OPTION 2   At the [facility], you and your health needs are our priority. We have a designated team specialized in the treatment of Heart Failure. This Care Team includes your primary Heart Failure Specialized Cardiologist (physician), Advanced Practice Providers (APPs- Physician Assistants and Nurse Practitioners), and Pharmacist who all work together to provide you with the care you need, when you need it.   You may see any of the following providers on your designated Care Team at your next follow up:  Dr. Jules Oar Dr. Peder Bourdon Dr. Alwin Baars Dr. Judyth Nunnery Nieves Bars, NP Ruddy Corral, [place] [facility] [place], [place] Dennise Fitz, NP [place] Lee, NP Luster Salters, PharmD   Please be sure to bring in all your medications bottles to every appointment.   Need to Contact Us :  If you have any questions or concerns before your next appointment please send us  a message through [place] or call our office at [phone].    TO LEAVE A MESSAGE FOR THE NURSE SELECT OPTION 2, PLEASE LEAVE A MESSAGE INCLUDING: YOUR NAME DATE OF BIRTH CALL BACK NUMBER REASON FOR CALL**this is important as we prioritize the call backs  YOU WILL RECEIVE A CALL BACK THE SAME DAY AS LONG AS YOU CALL BEFORE 4:00 PM

## 2023-12-03 NOTE — Progress Notes (Signed)
 Subjective:    Patient ID: Amanda Frederick is a 53 y.o. female.  HPI    [name], MD, PhD [facility] [address], [address] [address] [place], [place] [address]  Dear Dr. [name],   I saw your patient, Amanda Frederick, upon your kind request in my clinic today for initial consultation of her sleep disorder, in particular, concern for underlying obstructive sleep apnea.  Patient is accompanied by her husband today.  As you know, [name] is a 53 year old female with an underlying complex medical history of coronary artery disease with status post stent placement, chronic systolic congestive heart failure, depression, anxiety, neck pain, anemia, SVT, allergies, fibromyalgia, left ankle fracture with status post ORIF in 2022, hyperlipidemia, and obesity, who reports snoring and excessive daytime somnolence.  Her Epworth sleepiness score is 12 out of 24, fatigue severity score is 59 out of 63.  I reviewed your office note from 10/22/2023.  She has not slept well in years, she has chronic difficulty initiating and maintaining sleep.  She is not on any sleep aids over-the-counter or by prescription.  She tried melatonin which did not help.  She tried her husband's CPAP or BiPAP machine which did not help at all and made her feel like she was smothering.  She is not aware of any family of sleep apnea.  Her bedtime varies, typically around 8 or 9 PM and rise time anywhere from 9 AM to noon.  She has nocturia about twice per average night and has occasional morning headaches.  She lives with her husband, she may have no children.  They have 1 cat and 1 chincilla in the household.  She drinks caffeine  in the form of coffee, usually 1 cup in the morning.  She drinks alcohol very rarely, on special occasions.  She is a non-smoker.  Her weight fluctuates, particularly because of water retention.  She typically sleeps with the TV on at night.  She is familiar with PAP therapy as her husband  has sleep apnea.  Her Past Medical History Is Significant For: Past Medical History:  Diagnosis Date   AC (acromioclavicular) joint bone spurs    Anemia    Anxiety    Arthritis    Bronchitis    Bulging lumbar disc    Bursitis    CHF (congestive heart failure) (HCC)    Complication of anesthesia    "has died 3 separate times under anesthesia"   Depression    Diabetes (HCC)    Environmental allergies    Fibromyalgia    High blood pressure    High cholesterol    History of COVID-19 01/2020   was hospitalized   History of endometriosis    History of uterine fibroid    Migraines    Neuropathy    eyes , hands and feet    Pinched nerve    Pneumonia    Seizure (HCC)    after eye test only   Spondylosis    Tachycardia    after COVID   Tendonitis    Vision abnormalities     Her Past Surgical History Is Significant For: Past Surgical History:  Procedure Laterality Date   CORONARY STENT INTERVENTION N/A 02/19/2021   Procedure: CORONARY STENT INTERVENTION;  Surgeon: Lucendia Rusk, MD;  Location: [facility] INVASIVE CV LAB;  Service: Cardiovascular;  Laterality: N/A;   NASAL SINUS SURGERY     ORIF ANKLE FRACTURE Left 01/04/2021   Procedure: OPEN REDUCTION INTERNAL FIXATION (ORIF) LEFT  BIMALLEOLAR ANKLE FRACTURE;  Surgeon: Arnie Lao, MD;  Location: [facility] OR[facility];  Service: Orthopedics;  Laterality: Left;   RIGHT/LEFT HEART CATH AND CORONARY ANGIOGRAPHY N/A 02/19/2021   Procedure: RIGHT/LEFT HEART CATH AND CORONARY ANGIOGRAPHY;  Surgeon: Lucendia Rusk, MD;  Location: [facility] INVASIVE CV LAB;  Service: Cardiovascular;  Laterality: N/A;   RIGHT/LEFT HEART CATH AND CORONARY ANGIOGRAPHY N/A 09/20/2021   Procedure: RIGHT/LEFT HEART CATH AND CORONARY ANGIOGRAPHY;  Surgeon: Mardell Shade, MD;  Location: [facility] INVASIVE CV LAB;  Service: Cardiovascular;  Laterality: N/A;   TUBAL LIGATION     VAGINAL HYSTERECTOMY      Her Family History Is Significant For: Family History   Problem Relation Age of Onset   Anemia Mother    High Cholesterol Mother    Diabetes Mother    Arthritis Mother    Diabetes type I Maternal Grandmother    Colon cancer Neg Hx    Esophageal cancer Neg Hx    Stomach cancer Neg Hx    Colon polyps Neg Hx    Breast cancer Neg Hx     Her Social History Is Significant For: Social History   Socioeconomic History   Marital status: Married    Spouse name: [name]   Number of children: 3   Years of education: college   Highest education level: Not on file  Occupational History    Comment: Home maker  Tobacco Use   Smoking status: Never   Smokeless tobacco: Never  Vaping Use   Vaping status: Not on file  Substance and Sexual Activity   Alcohol use: Not Currently    Comment: special occassions   Drug use: No   Sexual activity: Not on file  Other Topics Concern   Not on file  Social History Narrative   Patient lives at home with her husband [name]. Worked previously in optometrist office, left work due to personnel issues. Patient has college education. Patient drinks 4-6 caffeine  drinks daily.Right handed.   Social Drivers of Corporate investment banker Strain: Low Risk  (07/29/2022)   Overall Financial Resource Strain (CARDIA)    Difficulty of Paying Living Expenses: Not hard at all  Food Insecurity: No Food Insecurity (07/29/2022)   Hunger Vital Sign    Worried About Running Out of Food in the Last Year: Never true    Ran Out of Food in the Last Year: Never true  Transportation Needs: No Transportation Needs (07/29/2022)   PRAPARE - Administrator, Civil Service (Medical): No    Lack of Transportation (Non-Medical): No  Physical Activity: Not on file  Stress: Stress Concern Present (07/29/2022)   [employer] of Occupational Health - Occupational Stress Questionnaire    Feeling of Stress : Very much  Social Connections: Not on file    Her Allergies Are:  Allergies  Allergen Reactions   Fentanyl  Nausea And  Vomiting   Other Hives    Antibiotic specific one unsure but has tolerated antibiotic recently. Reaction was about 20 years ago.   Wound Dressing Adhesive Other (See Comments)    Blisters  :   Her Current Medications Are:  Outpatient Encounter Medications as of 12/03/2023  Medication Sig   amitriptyline  (ELAVIL ) 10 MG tablet TAKE 1 TABLET BY MOUTH EVERYDAY AT BEDTIME   atorvastatin  (LIPITOR ) 80 MG tablet TAKE 1 TABLET BY MOUTH EVERY DAY   cholecalciferol  (VITAMIN D ) 1000 UNITS tablet Take 1,000 Units by mouth daily.   clopidogrel  (PLAVIX ) 75 MG tablet TAKE  1 TABLET BY MOUTH EVERY DAY   Continuous Blood Gluc Receiver (DEXCOM G7 RECEIVER) DEVI 1 each by Does not apply route as needed.   Continuous Glucose Sensor (DEXCOM G7 SENSOR) MISC Use to monitor blood sugar as directed   dicyclomine  (BENTYL ) 20 MG tablet Take 1 tablet (20 mg total) by mouth every 6 (six) hours as needed for spasms. Please schedule a yearly follow up for further refills. Thank you   DULoxetine  (CYMBALTA ) 30 MG capsule TAKE 1 CAPSULE BY MOUTH EVERY DAY   fluticasone  (FLONASE ) 50 MCG/ACT nasal spray INSTILL 1 SPRAY INTO BOTH NOSTRILS DAILY   furosemide  (LASIX ) 40 MG tablet Take 1 tablet (40 mg total) by mouth daily as needed.   glucose blood (BAYER CONTOUR NEXT TEST) test strip Test 8-10 times per day 8-10 lancets/day   Insulin  Infusion Pump (T:SLIM X2 CONTROL-IQ PUMP) DEVI 1 each by Does not apply route as needed.   Insulin  Infusion Pump Supplies (T:SLIM X2 CARTRIDGE) MISC Use with insulin  pump   Insulin  Infusion Pump Supplies (T:SLIM X2/CONTROL-IQ/ACC/INSTR) MISC Use with insulin  pump   ipratropium-albuterol  (DUONEB) 0.5-2.5 (3) MG/3ML SOLN Take 3 mLs by nebulization every 4 (four) hours as needed (wheezing).   Magnesium  250 MG TABS Take 250 mg by mouth daily.   metoprolol  succinate (TOPROL -XL) 50 MG 24 hr tablet TAKE 1 TABLET BY MOUTH TWICE A DAY   Misc Natural Products (BEET ROOT PO) Take 500 mg by mouth daily.    Multiple Vitamin (MULTIVITAMIN) capsule Take 1 capsule by mouth daily.   NOVOLOG  100 UNIT/ML injection TAKE UP TO 200 UNITS PER DAY VIA THE PUMP AS INSTRUCTED   potassium chloride  SA (KLOR-CON  M20) 20 MEQ tablet TAKE 1 TABLET BY MOUTH EVERY DAY   sacubitril -valsartan  (ENTRESTO ) 97-103 MG Take 1 tablet by mouth 2 (two) times daily.   spironolactone  (ALDACTONE ) 25 MG tablet TAKE 0.5 TABLETS (12.5 MG TOTAL) BY MOUTH DAILY. NEEDS FOLLOW UP APPOINTMENT FOR ANYMORE REFILLS   vitamin B-12 (CYANOCOBALAMIN ) 500 MCG tablet Take 500 mcg by mouth daily.   cholestyramine  (QUESTRAN ) 4 g packet MIX AND TAKE 1 PACKET (4 G TOTAL) BY MOUTH EVERY 12 (TWELVE) HOURS AS NEEDED. (Patient not taking: Reported on 12/03/2023)   Insulin  Infusion Pump Supplies (PARADIGM QUICK-SET 32" ) MISC Use with novolog  in insulin  pump (Patient not taking: Reported on 12/03/2023)   loperamide  (IMODIUM ) 2 MG capsule Take 1 capsule (2 mg total) by mouth as needed for diarrhea or loose stools. Take 2 mg after every loose bowel movement with a maximum of 16 mg a day (maximum of 8 pills a day) (Patient not taking: Reported on 12/03/2023)   Facility-Administered Encounter Medications as of 12/03/2023  Medication   sodium chloride  flush (NS) 0.9 % injection 3 mL  :   Review of Systems:  Out of a complete 14 point review of systems, all are reviewed and negative with the exception of these symptoms as listed below:  Review of Systems  Neurological:        Room 9 Pt is here with her Husband. Pt's husband states that she snores at night. Pt's husband states that pt doesn't have any Apnea. Pt states that may get 4 hours of sleep if she is lucky.  Pt has a history of Heart Failure. Pt has tried Melatonin and it hasn't helped. Pt states that she will be sleep and then wake up out of nowhere and can barely go back to sleep. Pt states that pain wakes her up at  night. ESS 12/13 FSS 59    Objective:  Neurological Exam  Physical Exam Physical  Examination:   Vitals:   12/03/23 1427  BP: 125/85  Pulse: 81    General Examination: The patient is a very pleasant 53 y.o. female in no acute distress. She appears well-developed and well-nourished and well groomed.   HEENT: Normocephalic, atraumatic, pupils are equal, round and reactive to light, extraocular tracking is good without limitation to gaze excursion or nystagmus noted. Hearing is grossly intact. Face is symmetric with normal facial animation. Speech is clear with no dysarthria noted. There is no hypophonia. There is no lip, neck/head, jaw or voice tremor. Neck is supple with full range of passive and active motion. There are no carotid bruits on auscultation. Oropharynx exam reveals: mild mouth dryness, good dental hygiene and mild airway crowding, due to small airway entry, slightly prominent uvula but not swollen, tonsils small, Mallampati class II.  Neck circumference 15 and three-quarter inches.  Tongue protrudes centrally and palate elevates symmetrically.  She has a minimal overbite.  Chest: Clear to auscultation without wheezing, rhonchi or crackles noted.  Heart: S1+S2+0, regular and normal without murmurs, rubs or gallops noted.   Abdomen: Soft, non-tender and non-distended.  Extremities: There is pitting edema in the distal lower extremities bilaterally.   Skin: Warm and dry without trophic changes noted.   Musculoskeletal: exam reveals unremarkable surgical scar left ankle which is wider compared to right.    Neurologically:  Mental status: The patient is awake, alert and oriented in all 4 spheres. Her immediate and remote memory, attention, language skills and fund of knowledge are appropriate. There is no evidence of aphasia, agnosia, apraxia or anomia. Speech is clear with normal prosody and enunciation. Thought process is linear. Mood is normal and affect is normal.  Cranial nerves II - XII are as described above under HEENT exam.  Motor exam: Normal bulk,  moving all 4 extremities without restriction, no obvious action or resting tremor.  Fine motor skills and coordination: grossly intact.  Cerebellar testing: No dysmetria or intention tremor. There is no truncal or gait ataxia.  Sensory exam: intact to light touch in the upper and lower extremities.  Gait, station and balance: She stands easily. No veering to one side is noted. No leaning to one side is noted. Posture is age-appropriate and stance is narrow based. Gait shows normal stride length and normal pace. No problems turning are noted.   Assessment and Plan:  In summary, Geniva Lootens is a very pleasant 53 y.o.-year old female with an underlying complex medical history of coronary artery disease with status post stent placement, chronic systolic congestive heart failure, depression, anxiety, neck pain, anemia, SVT, allergies, fibromyalgia, left ankle fracture with status post ORIF in 2022, hyperlipidemia, and obesity, whose history and physical exam are concerning for sleep disordered breathing, particularly obstructive sleep apnea (OSA). A laboratory attended sleep study is typically considered "gold standard" for evaluation of sleep disordered breathing.   I had a long chat with the patient and her husband about my findings and the diagnosis of sleep apnea, particularly OSA, its prognosis and treatment options. We talked about medical/conservative treatments, surgical interventions and non-pharmacological approaches for symptom control. I explained, in particular, the risks and ramifications of untreated moderate to severe OSA, especially with respect to developing cardiovascular disease down the road, including congestive heart failure (CHF), difficult to treat hypertension, cardiac arrhythmias (particularly A-fib), neurovascular complications including TIA, stroke and dementia. Even type 2 diabetes  has, in part, been linked to untreated OSA. Symptoms of untreated OSA may include (but may not be  limited to) daytime sleepiness, nocturia (i.e. frequent nighttime urination), memory problems, mood irritability and suboptimally controlled or worsening mood disorder such as depression and/or anxiety, lack of energy, lack of motivation, physical discomfort, as well as recurrent headaches, especially morning or nocturnal headaches. We talked about the importance of maintaining a healthy lifestyle and striving for healthy weight. In addition, we talked about the importance of striving for and maintaining good sleep hygiene. I recommended a sleep study at this time. I outlined the differences between a laboratory attended sleep study which is considered more comprehensive and accurate over the option of a home sleep test (HST); the latter may lead to underestimation of sleep disordered breathing in some instances and does not help with diagnosing upper airway resistance syndrome and is not accurate enough to diagnose primary central sleep apnea typically. I outlined possible surgical and non-surgical treatment options of OSA, including the use of a positive airway pressure (PAP) device (i.e. CPAP, AutoPAP/APAP or BiPAP in certain circumstances), a custom-made dental device (aka oral appliance, which would require a referral to a specialist dentist or orthodontist typically, and is generally speaking not considered for patients with full dentures or edentulous state), upper airway surgical options, such as traditional UPPP (which is not considered a first-line treatment) or the Inspire device (hypoglossal nerve stimulator, which would involve a referral for consultation with an ENT surgeon, after careful selection, following inclusion criteria - also not first-line treatment). I explained the PAP treatment option to the patient in detail, as this is generally considered first-line treatment.  The patient indicated that she would be willing to try PAP therapy, if the need arises. I explained the importance of being  compliant with PAP treatment, not only for insurance purposes but primarily to improve patient's symptoms symptoms, and for the patient's long term health benefit, including to reduce Her cardiovascular risks longer-term.    We will pick up our discussion about the next steps and treatment options after testing.  We will keep her posted as to the test results by phone call and/or MyChart messaging where possible.  We will plan to follow-up in sleep clinic accordingly as well.  I answered all their questions today and the patient and her husband were in agreement.   I encouraged her to call with any interim questions, concerns, problems or updates or email us  through MyChart.  Generally speaking, sleep test authorizations may take up to 2 weeks, sometimes less, sometimes longer, the patient is encouraged to get in touch with us  if they do not hear back from the sleep lab staff directly within the next 2 weeks.  Thank you very much for allowing me to participate in the care of this nice patient. If I can be of any further assistance to you please do not hesitate to call me at [phone].  Sincerely,   [name], MD, PhD

## 2023-12-03 NOTE — Patient Instructions (Signed)

## 2023-12-03 NOTE — Addendum Note (Signed)
 Encounter addended by: Dewey Fordyce, CMA on: 12/03/2023 10:31 AM  Actions taken: Order list changed, Diagnosis association updated, Flowsheet accepted, Clinical Note Signed, Charge Capture section accepted

## 2023-12-03 NOTE — Progress Notes (Signed)
 ReDS Vest / Clip - 12/03/23 1000       ReDS Vest / Clip   Station Marker D    Ruler Value 31.5    ReDS Value Range Low volume    ReDS Actual Value 27

## 2023-12-03 NOTE — Progress Notes (Signed)
 Advanced Heart Failure Clinic Note    PCP: Cleven Dallas, DO Primary Cardiologist: Euell Herrlich, MD  HF Cardiologist: Dr. Julane Ny  HPI: Amanda Frederick is a 53 y.o. female with history of DM1, HTN, HLD, hx fibromyalgia, neuropathy, hx COVID-19 infection 02/2020.   Reports gradually progressing dyspnea with exertion for a couple of years. Felt horrible after COVID and dyspnea never really improved. Underwent ORIF for ankle fracture in June 2022. Initially did okay post-op. Subsequently developed LE edema, dyspnea and orthopnea. She was later admitted July 2022 with acute systolic CHF.   Echo 7/22: EF 25-30%, RV ok, large left pleural effusion, mild MR. Diuresed with IV lasix . Underwent thoracentesis for L pleural effusion.  Echo 11/22: EF 30-35%, septal and peri-apical severe hypokinesis, RV okay, mild MR  Seen for initial HF clinic visit 08/07/21. Noted ongoing dyspnea with exertion and chronic CP. Volume looked okay. ReDS 28%. Started on spiro.   Echo 09/16/21: EF 35-40%, RV okay, mild MR  cMRI 09/16/21: LVEF 43%, RVEF 49%, delayed enhancement showing myocardial infarction-pattern scarring consistent with substantial LAD territory MI.  Cath 09/20/21 showed progression of LAD disease. Now with diffuse diabetic CAD in mid to distal LAD corresponding to infarct seen on cMRI. Reviewed with IC team. No target for intervention particularly given lack of viability on MRI. EF 35-45%   Ao = 133/79 (104) LV = 124/15 RA =  1 RV = 30/6 PA = 29/8 (17) PCW = 10 Fick cardiac output/index = 6.1/3.1 PVR = 0.9 WU Ao sat = 99% PA sat = 81%, 82% High SVC = 79%  Echo 4/24 showed 35-40%, RV ok.  Here for routine f/u. Says she is doing pretty well. Working around the house. Has occasional dizziness and ab swelling. SOB if she does too much. Able to feed and water pigs and chickens.    ReDs 27%   Echo today 12/03/23 EF 40-45% RV ok Personally reviewed  Review of Systems:  Cardiac and  Respiratory - Negative except as mentioned in HPI.  Past Medical History:  Diagnosis Date   AC (acromioclavicular) joint bone spurs    Anemia    Anxiety    Arthritis    Bronchitis    Bulging lumbar disc    Bursitis    CHF (congestive heart failure) (HCC)    Complication of anesthesia    "has died 3 separate times under anesthesia"   Depression    Diabetes (HCC)    Environmental allergies    Fibromyalgia    High blood pressure    High cholesterol    History of COVID-19 01/2020   was hospitalized   History of endometriosis    History of uterine fibroid    Migraines    Neuropathy    eyes , hands and feet    Pinched nerve    Pneumonia    Seizure (HCC)    after eye test only   Spondylosis    Tachycardia    after COVID   Tendonitis    Vision abnormalities    Current Outpatient Medications  Medication Sig Dispense Refill   amitriptyline  (ELAVIL ) 10 MG tablet TAKE 1 TABLET BY MOUTH EVERYDAY AT BEDTIME 90 tablet 4   atorvastatin  (LIPITOR ) 80 MG tablet TAKE 1 TABLET BY MOUTH EVERY DAY 90 tablet 3   cholecalciferol  (VITAMIN D ) 1000 UNITS tablet Take 1,000 Units by mouth daily.     cholestyramine  (QUESTRAN ) 4 g packet MIX AND TAKE 1 PACKET (4 G TOTAL) BY MOUTH EVERY 12 (  TWELVE) HOURS AS NEEDED. 180 packet 2   clopidogrel  (PLAVIX ) 75 MG tablet TAKE 1 TABLET BY MOUTH EVERY DAY 30 tablet 11   Continuous Blood Gluc Receiver (DEXCOM G7 RECEIVER) DEVI 1 each by Does not apply route as needed. 1 each 0   Continuous Glucose Sensor (DEXCOM G7 SENSOR) MISC Use to monitor blood sugar as directed 9 each 3   dicyclomine  (BENTYL ) 20 MG tablet Take 1 tablet (20 mg total) by mouth every 6 (six) hours as needed for spasms. Please schedule a yearly follow up for further refills. Thank you 30 tablet 0   DULoxetine  (CYMBALTA ) 30 MG capsule TAKE 1 CAPSULE BY MOUTH EVERY DAY 90 capsule 1   fluticasone  (FLONASE ) 50 MCG/ACT nasal spray INSTILL 1 SPRAY INTO BOTH NOSTRILS DAILY 48 mL 2   furosemide   (LASIX ) 40 MG tablet Take 1 tablet (40 mg total) by mouth daily as needed. 60 tablet 8   glucose blood (BAYER CONTOUR NEXT TEST) test strip Test 8-10 times per day 8-10 lancets/day 300 each 11   Insulin  Infusion Pump (T:SLIM X2 CONTROL-IQ PUMP) DEVI 1 each by Does not apply route as needed. 1 each 0   Insulin  Infusion Pump Supplies (PARADIGM QUICK-SET 32" ) MISC Use with novolog  in insulin  pump 40 each 3   Insulin  Infusion Pump Supplies (T:SLIM X2 CARTRIDGE) MISC Use with insulin  pump 30 each 4   Insulin  Infusion Pump Supplies (T:SLIM X2/CONTROL-IQ/ACC/INSTR) MISC Use with insulin  pump 1 each 1   ipratropium-albuterol  (DUONEB) 0.5-2.5 (3) MG/3ML SOLN Take 3 mLs by nebulization every 4 (four) hours as needed (wheezing). 360 mL 0   loperamide  (IMODIUM ) 2 MG capsule Take 1 capsule (2 mg total) by mouth as needed for diarrhea or loose stools. Take 2 mg after every loose bowel movement with a maximum of 16 mg a day (maximum of 8 pills a day) 90 capsule 2   Magnesium  250 MG TABS Take 250 mg by mouth daily.     metoprolol  succinate (TOPROL -XL) 50 MG 24 hr tablet TAKE 1 TABLET BY MOUTH TWICE A DAY 180 tablet 3   Misc Natural Products (BEET ROOT PO) Take 500 mg by mouth daily.     Multiple Vitamin (MULTIVITAMIN) capsule Take 1 capsule by mouth daily.     NOVOLOG  100 UNIT/ML injection TAKE UP TO 200 UNITS PER DAY VIA THE PUMP AS INSTRUCTED 60 mL 11   potassium chloride  SA (KLOR-CON  M20) 20 MEQ tablet TAKE 1 TABLET BY MOUTH EVERY DAY 90 tablet 3   sacubitril -valsartan  (ENTRESTO ) 49-51 MG Take 1 tablet by mouth 2 (two) times daily. 60 tablet 3   spironolactone  (ALDACTONE ) 25 MG tablet TAKE 0.5 TABLETS (12.5 MG TOTAL) BY MOUTH DAILY. NEEDS FOLLOW UP APPOINTMENT FOR ANYMORE REFILLS 45 tablet 1   vitamin B-12 (CYANOCOBALAMIN ) 500 MCG tablet Take 500 mcg by mouth daily.     No current facility-administered medications for this encounter.   Facility-Administered Medications Ordered in Other Encounters   Medication Dose Route Frequency Provider Last Rate Last Admin   sodium chloride  flush (NS) 0.9 % injection 3 mL  3 mL Intravenous Q12H Sadeen Wiegel, Rheta Celestine, MD       Allergies  Allergen Reactions   Fentanyl  Nausea And Vomiting   Other Hives    Antibiotic specific one unsure but has tolerated antibiotic recently. Reaction was about 20 years ago.   Wound Dressing Adhesive Other (See Comments)    Blisters   Social History   Socioeconomic History   Marital  status: Married    Spouse name: Amanda Frederick   Number of children: 3   Years of education: college   Highest education level: Not on file  Occupational History    Comment: Home maker  Tobacco Use   Smoking status: Never   Smokeless tobacco: Never  Vaping Use   Vaping status: Not on file  Substance and Sexual Activity   Alcohol use: Not Currently    Comment: special occassions   Drug use: No   Sexual activity: Not on file  Other Topics Concern   Not on file  Social History Narrative   Patient lives at home with her husband Amanda Frederick. Worked previously in optometrist office, left work due to personnel issues. Patient has college education. Patient drinks 4-6 caffeine  drinks daily.Right handed.   Social Drivers of Corporate investment banker Strain: Low Risk  (07/29/2022)   Overall Financial Resource Strain (CARDIA)    Difficulty of Paying Living Expenses: Not hard at all  Food Insecurity: No Food Insecurity (07/29/2022)   Hunger Vital Sign    Worried About Running Out of Food in the Last Year: Never true    Ran Out of Food in the Last Year: Never true  Transportation Needs: No Transportation Needs (07/29/2022)   PRAPARE - Administrator, Civil Service (Medical): No    Lack of Transportation (Non-Medical): No  Physical Activity: Not on file  Stress: Stress Concern Present (07/29/2022)   [employer] of Occupational Health - Occupational Stress Questionnaire    Feeling of Stress : Very much  Social Connections: Not on  file  Intimate Partner Violence: Not At Risk (07/29/2022)   Humiliation, Afraid, Rape, and Kick questionnaire    Fear of Current or Ex-Partner: No    Emotionally Abused: No    Physically Abused: No    Sexually Abused: No   Family History  Problem Relation Age of Onset   Anemia Mother    High Cholesterol Mother    Diabetes Mother    Arthritis Mother    Diabetes type I Maternal Grandmother    Colon cancer Neg Hx    Esophageal cancer Neg Hx    Stomach cancer Neg Hx    Colon polyps Neg Hx    Breast cancer Neg Hx    BP 136/88   Pulse 75   Wt 97.9 kg (215 lb 12.8 oz)   SpO2 95%   BMI 34.83 kg/m    Wt Readings from Last 3 Encounters:  12/03/23 97.9 kg (215 lb 12.8 oz)  11/02/23 97.3 kg (214 lb 9.6 oz)  10/22/23 96.2 kg (212 lb)   PHYSICAL EXAM: General:  Well appearing. No resp difficulty HEENT: normal Neck: supple. no JVD. Carotids 2+ bilat; no bruits. No lymphadenopathy or thryomegaly appreciated. Cor: PMI nondisplaced. Regular rate & rhythm. No rubs, gallops or murmurs. Lungs: clear Abdomen: obese soft, nontender, nondistended. No hepatosplenomegaly. No bruits or masses. Good bowel sounds. Extremities: no cyanosis, clubbing, rash, edema Neuro: alert & orientedx3, cranial nerves grossly intact. moves all 4 extremities w/o difficulty. Affect pleasant   ReDs: 27%  ASSESSMENT & PLAN: Chronic systolic CHF/ICM: - Echo (7/22): EF 25-30%, RV okay - R/LHC (7/22): RA 6, PA mean 23, PCWP 9, Fick CI 2.54. 90% Lcx treated with PCI/DES, diffuse disease marginal branches, LAD and diagonal - Echo (11/22): EF 30-35% RV okay - Echo (2/23): EF 35-40%, RV okay, mild MR - cMRI (2/23): LVEF 43%, RVEF 49%, delayed enhancement suggestive of substantial LAD territory MI. -  Cath 2/23 EF 35-40% showed progression of LAD disease. Now with diffuse diabetic CAD in mid to distal LAD corresponding to infarct seen on cMRI. Reviewed with IC team. Lcx stent patent. No target for intervention  particularly given lack of viability on MRI. RHC ok  - Echo 4/24 showed 35-40%, RV ok - Echo today 12/03/23  EF 40-45% RV ok Personally reviewed - NYHA II-III Volume ok - Degree of symptoms seem out of proportion to cardiac findings. Suspect weight and depression playing a roe - Continue Lasix  40 mg daily,  - Increase Entresto  to 97/130 mg BID - Continue Toprol  XL 50 mg bid - Continue spiro 12.5 mg daily. - No SGLT2i with Type I DM - Encouraged more activity - Recent labs reviewed 11/16/23 K 4.0 Cr 0.89   2. CAD: - s/p PCI/DES mid Lcx 7/22.  - cMRI with delayed enhancement suggestive of LAD territory myocardial infarction. Dr. Julane Ny reviewed prior cath images from 07/22 personally. There is 70-80% diffuse disease in mid to distal LAD (? Event at some point with acute occlusion and subsequent recannulation) - Cath 2/23 as above; continue Medical therapy  - Continues with occasional CP - Off ASA. Now on plavix  as single antiplatelet agent. - Continue atorvastatin  80. - Followed by [facility] - would benefit from CR, have not been able to coordinate in past due to scheduling conflicts. We discussed this again  3. Palpitations - Zio 1/24 4 runs NSVT. 1 run SVT - Zio 11/24 7 runs NSVT longest 6 beats and 4 runs SVT longest 17 beats - Continue metoprolol  - Sleep consult as below  4. Hyperlipidemia - On high-intensity statin - Managed by primary cardiology team - Last LDL was 59  5. Type I DM: - Has insulin  pump - A1c 8.4 -> 12-->14-->7.0 -> 7.0 - Followed by Endocrine  6. Snoring - Has f/u today with [facility]  7. HTN - BP still up at time - increase Entresto     Jules Oar, MD  9:47 AM

## 2023-12-08 ENCOUNTER — Telehealth: Payer: Self-pay | Admitting: Neurology

## 2023-12-08 NOTE — Telephone Encounter (Signed)
 NPSG [employer] pending

## 2023-12-14 NOTE — Telephone Encounter (Signed)
 [employer] denied the NPSG see below for the reason.  HST [employer] no auth req.

## 2023-12-16 ENCOUNTER — Encounter: Payer: Self-pay | Admitting: Internal Medicine

## 2023-12-18 DIAGNOSIS — E1065 Type 1 diabetes mellitus with hyperglycemia: Secondary | ICD-10-CM | POA: Diagnosis not present

## 2023-12-18 DIAGNOSIS — E1049 Type 1 diabetes mellitus with other diabetic neurological complication: Secondary | ICD-10-CM | POA: Diagnosis not present

## 2023-12-18 DIAGNOSIS — E108 Type 1 diabetes mellitus with unspecified complications: Secondary | ICD-10-CM | POA: Diagnosis not present

## 2023-12-18 DIAGNOSIS — Z794 Long term (current) use of insulin: Secondary | ICD-10-CM | POA: Diagnosis not present

## 2023-12-18 DIAGNOSIS — E109 Type 1 diabetes mellitus without complications: Secondary | ICD-10-CM | POA: Diagnosis not present

## 2023-12-23 DIAGNOSIS — E108 Type 1 diabetes mellitus with unspecified complications: Secondary | ICD-10-CM | POA: Diagnosis not present

## 2023-12-23 DIAGNOSIS — I509 Heart failure, unspecified: Secondary | ICD-10-CM | POA: Diagnosis not present

## 2023-12-23 DIAGNOSIS — E669 Obesity, unspecified: Secondary | ICD-10-CM | POA: Diagnosis not present

## 2023-12-23 DIAGNOSIS — H3552 Pigmentary retinal dystrophy: Secondary | ICD-10-CM | POA: Diagnosis not present

## 2023-12-23 DIAGNOSIS — Z8616 Personal history of COVID-19: Secondary | ICD-10-CM | POA: Diagnosis not present

## 2023-12-23 DIAGNOSIS — D751 Secondary polycythemia: Secondary | ICD-10-CM | POA: Diagnosis not present

## 2023-12-23 DIAGNOSIS — E1042 Type 1 diabetes mellitus with diabetic polyneuropathy: Secondary | ICD-10-CM | POA: Diagnosis not present

## 2023-12-23 DIAGNOSIS — I2510 Atherosclerotic heart disease of native coronary artery without angina pectoris: Secondary | ICD-10-CM | POA: Diagnosis not present

## 2023-12-23 DIAGNOSIS — E103413 Type 1 diabetes mellitus with severe nonproliferative diabetic retinopathy with macular edema, bilateral: Secondary | ICD-10-CM | POA: Diagnosis not present

## 2023-12-23 DIAGNOSIS — E782 Mixed hyperlipidemia: Secondary | ICD-10-CM | POA: Diagnosis not present

## 2023-12-23 DIAGNOSIS — I10 Essential (primary) hypertension: Secondary | ICD-10-CM | POA: Diagnosis not present

## 2023-12-25 ENCOUNTER — Ambulatory Visit

## 2023-12-25 DIAGNOSIS — I5022 Chronic systolic (congestive) heart failure: Secondary | ICD-10-CM

## 2023-12-25 DIAGNOSIS — Z9189 Other specified personal risk factors, not elsewhere classified: Secondary | ICD-10-CM

## 2023-12-25 DIAGNOSIS — Z955 Presence of coronary angioplasty implant and graft: Secondary | ICD-10-CM

## 2023-12-25 DIAGNOSIS — G4700 Insomnia, unspecified: Secondary | ICD-10-CM

## 2023-12-25 DIAGNOSIS — R519 Headache, unspecified: Secondary | ICD-10-CM

## 2023-12-25 DIAGNOSIS — R0683 Snoring: Secondary | ICD-10-CM

## 2023-12-25 DIAGNOSIS — E66811 Obesity, class 1: Secondary | ICD-10-CM

## 2023-12-25 DIAGNOSIS — G4719 Other hypersomnia: Secondary | ICD-10-CM

## 2023-12-25 DIAGNOSIS — R351 Nocturia: Secondary | ICD-10-CM

## 2024-02-13 ENCOUNTER — Other Ambulatory Visit (HOSPITAL_COMMUNITY): Payer: Self-pay | Admitting: Internal Medicine

## 2024-02-24 ENCOUNTER — Ambulatory Visit: Admitting: Student

## 2024-02-24 VITALS — BP 129/85 | HR 81 | Temp 98.2°F | Ht 66.0 in | Wt 218.0 lb

## 2024-02-24 DIAGNOSIS — Z1211 Encounter for screening for malignant neoplasm of colon: Secondary | ICD-10-CM

## 2024-02-24 DIAGNOSIS — Z0000 Encounter for general adult medical examination without abnormal findings: Secondary | ICD-10-CM

## 2024-02-24 DIAGNOSIS — E669 Obesity, unspecified: Secondary | ICD-10-CM

## 2024-02-24 DIAGNOSIS — Z6835 Body mass index (BMI) 35.0-35.9, adult: Secondary | ICD-10-CM

## 2024-02-24 DIAGNOSIS — E109 Type 1 diabetes mellitus without complications: Secondary | ICD-10-CM | POA: Diagnosis not present

## 2024-02-24 DIAGNOSIS — I5032 Chronic diastolic (congestive) heart failure: Secondary | ICD-10-CM

## 2024-02-24 DIAGNOSIS — M25522 Pain in left elbow: Secondary | ICD-10-CM | POA: Diagnosis not present

## 2024-02-24 DIAGNOSIS — E1059 Type 1 diabetes mellitus with other circulatory complications: Secondary | ICD-10-CM

## 2024-02-24 LAB — POCT GLYCOSYLATED HEMOGLOBIN (HGB A1C): Hemoglobin A1C: 7.6 % — AB (ref 4.0–5.6)

## 2024-02-24 LAB — GLUCOSE, CAPILLARY: Glucose-Capillary: 328 mg/dL — ABNORMAL HIGH (ref 70–99)

## 2024-02-24 NOTE — Progress Notes (Unsigned)
 CC: Follow-up  HPI:  [name] is a 53 y.o. female living with a history stated below and presents today for follow-up. Please see problem based assessment and plan for additional details.  Past Medical History:  Diagnosis Date   AC (acromioclavicular) joint bone spurs    Anemia    Anxiety    Arthritis    Bronchitis    Bulging lumbar disc    Bursitis    CHF (congestive heart failure) (HCC)    Complication of anesthesia    has died 3 separate times under anesthesia   Depression    Diabetes (HCC)    Environmental allergies    Fibromyalgia    High blood pressure    High cholesterol    History of COVID-19 01/2020   was hospitalized   History of endometriosis    History of uterine fibroid    Migraines    Neuropathy    eyes , hands and feet    Pinched nerve    Pneumonia    Seizure (HCC)    after eye test only   Spondylosis    Tachycardia    after COVID   Tendonitis    Vision abnormalities     Current Outpatient Medications on File Prior to Visit  Medication Sig Dispense Refill   amitriptyline  (ELAVIL ) 10 MG tablet TAKE 1 TABLET BY MOUTH EVERYDAY AT BEDTIME 90 tablet 5   atorvastatin  (LIPITOR ) 80 MG tablet TAKE 1 TABLET BY MOUTH EVERY DAY 90 tablet 3   cholecalciferol  (VITAMIN D ) 1000 UNITS tablet Take 1,000 Units by mouth daily.     cholestyramine  (QUESTRAN ) 4 g packet MIX AND TAKE 1 PACKET (4 G TOTAL) BY MOUTH EVERY 12 (TWELVE) HOURS AS NEEDED. (Patient not taking: Reported on 12/03/2023) 180 packet 2   clopidogrel  (PLAVIX ) 75 MG tablet TAKE 1 TABLET BY MOUTH EVERY DAY 30 tablet 11   Continuous Blood Gluc Receiver (DEXCOM G7 RECEIVER) DEVI 1 each by Does not apply route as needed. 1 each 0   Continuous Glucose Sensor (DEXCOM G7 SENSOR) MISC Use to monitor blood sugar as directed 9 each 3   dicyclomine  (BENTYL ) 20 MG tablet Take 1 tablet (20 mg total) by mouth every 6 (six) hours as needed for spasms. Please schedule a yearly follow up for further refills.  Thank you 30 tablet 0   DULoxetine  (CYMBALTA ) 30 MG capsule TAKE 1 CAPSULE BY MOUTH EVERY DAY 90 capsule 1   fluticasone  (FLONASE ) 50 MCG/ACT nasal spray INSTILL 1 SPRAY INTO BOTH NOSTRILS DAILY 48 mL 2   furosemide  (LASIX ) 40 MG tablet Take 1 tablet (40 mg total) by mouth daily as needed. 60 tablet 8   glucose blood (BAYER CONTOUR NEXT TEST) test strip Test 8-10 times per day 8-10 lancets/day 300 each 11   Insulin  Infusion Pump (T:SLIM X2 CONTROL-IQ PUMP) DEVI 1 each by Does not apply route as needed. 1 each 0   Insulin  Infusion Pump Supplies (PARADIGM QUICK-SET 32 ) MISC Use with novolog  in insulin  pump (Patient not taking: Reported on 12/03/2023) 40 each 3   Insulin  Infusion Pump Supplies (T:SLIM X2 CARTRIDGE) MISC Use with insulin  pump 30 each 4   Insulin  Infusion Pump Supplies (T:SLIM X2/CONTROL-IQ/ACC/INSTR) MISC Use with insulin  pump 1 each 1   ipratropium-albuterol  (DUONEB) 0.5-2.5 (3) MG/3ML SOLN Take 3 mLs by nebulization every 4 (four) hours as needed (wheezing). 360 mL 0   loperamide  (IMODIUM ) 2 MG capsule Take 1 capsule (2 mg total) by mouth as needed  for diarrhea or loose stools. Take 2 mg after every loose bowel movement with a maximum of 16 mg a day (maximum of 8 pills a day) (Patient not taking: Reported on 12/03/2023) 90 capsule 2   Magnesium  250 MG TABS Take 250 mg by mouth daily.     metoprolol  succinate (TOPROL -XL) 50 MG 24 hr tablet TAKE 1 TABLET BY MOUTH TWICE A DAY 180 tablet 3   Misc Natural Products (BEET ROOT PO) Take 500 mg by mouth daily.     Multiple Vitamin (MULTIVITAMIN) capsule Take 1 capsule by mouth daily.     NOVOLOG  100 UNIT/ML injection TAKE UP TO 200 UNITS PER DAY VIA THE PUMP AS INSTRUCTED 60 mL 11   potassium chloride  SA (KLOR-CON  M20) 20 MEQ tablet TAKE 1 TABLET BY MOUTH EVERY DAY 90 tablet 3   sacubitril -valsartan  (ENTRESTO ) 97-103 MG Take 1 tablet by mouth 2 (two) times daily. 60 tablet 5   spironolactone  (ALDACTONE ) 25 MG tablet TAKE 0.5 TABLET (12.5  MG TOTAL) BY MOUTH DAILY. NEEDS FOLLOW UP APPOINTMENT FOR ANYMORE REFILLS 15 tablet 5   vitamin B-12 (CYANOCOBALAMIN ) 500 MCG tablet Take 500 mcg by mouth daily.     Current Facility-Administered Medications on File Prior to Visit  Medication Dose Route Frequency Provider Last Rate Last Admin   sodium chloride  flush (NS) 0.9 % injection 3 mL  3 mL Intravenous Q12H Bensimhon, Toribio SAUNDERS, MD        Family History  Problem Relation Age of Onset   Anemia Mother    High Cholesterol Mother    Diabetes Mother    Arthritis Mother    Diabetes type I Maternal Grandmother    Colon cancer Neg Hx    Esophageal cancer Neg Hx    Stomach cancer Neg Hx    Colon polyps Neg Hx    Breast cancer Neg Hx     Social History   Socioeconomic History   Marital status: Married    Spouse name: Amanda Frederick   Number of children: 3   Years of education: college   Highest education level: Not on file  Occupational History    Comment: Home maker  Tobacco Use   Smoking status: Never   Smokeless tobacco: Never  Vaping Use   Vaping status: Not on file  Substance and Sexual Activity   Alcohol use: Not Currently    Comment: special occassions   Drug use: No   Sexual activity: Not on file  Other Topics Concern   Not on file  Social History Narrative   Patient lives at home with her husband Amanda Frederick. Worked previously in optometrist office, left work due to personnel issues. Patient has college education. Patient drinks 4-6 caffeine  drinks daily.Right handed.   Social Drivers of Corporate investment banker Strain: Low Risk  (07/29/2022)   Overall Financial Resource Strain (CARDIA)    Difficulty of Paying Living Expenses: Not hard at all  Food Insecurity: No Food Insecurity (07/29/2022)   Hunger Vital Sign    Worried About Running Out of Food in the Last Year: Never true    Ran Out of Food in the Last Year: Never true  Transportation Needs: No Transportation Needs (07/29/2022)   PRAPARE - Scientist, research (physical sciences) (Medical): No    Lack of Transportation (Non-Medical): No  Physical Activity: Not on file  Stress: Stress Concern Present (07/29/2022)   [employer] of Occupational Health - Occupational Stress Questionnaire    Feeling of Stress :  Very much  Social Connections: Not on file  Intimate Partner Violence: Not At Risk (07/29/2022)   Humiliation, Afraid, Rape, and Kick questionnaire    Fear of Current or Ex-Partner: No    Emotionally Abused: No    Physically Abused: No    Sexually Abused: No    Review of Systems: ROS negative except for what is noted on the assessment and plan.  Vitals:   02/24/24 1602  BP: 129/85  Pulse: 81  Temp: 98.2 F (36.8 C)  TempSrc: Oral  SpO2: 93%  Weight: 218 lb (98.9 kg)  Height: 5' 6 (1.676 m)    Physical Exam: Constitutional: well-appearing, sitting in chair, in no acute distress Cardiovascular: regular rate and rhythm, no m/r/g Pulmonary/Chest: normal work of breathing on room air, lungs clear to auscultation bilaterally Abdominal: soft, non-tender, non-distended MSK: normal bulk and tone Skin: warm and dry Psych: normal mood and behavior  Assessment & Plan:     Patient {[id number]::discussed with,seen with} Dr. {[id number]::Tpoopjfd,Z. Hoffman,Mullen,Narendra,Vincent,Guilloud,Lau,Machen}  No problem-specific Assessment & Plan notes found for this encounter.   Norman Lobstein, D.O. [facility] Internal Medicine, PGY-1 Phone: [phone] Date 02/24/2024 Time 4:21 PM

## 2024-02-26 DIAGNOSIS — M25522 Pain in left elbow: Secondary | ICD-10-CM | POA: Insufficient documentation

## 2024-02-26 DIAGNOSIS — E669 Obesity, unspecified: Secondary | ICD-10-CM | POA: Insufficient documentation

## 2024-02-26 NOTE — Assessment & Plan Note (Addendum)
 A1c increased to 7.6 today from 7.0 in 09/2023.  She is using an infusion pump and also follows with endocrinology.  Given overall mild increase and history of well-controlled A1c's, will manage with lifestyle modifications which was counseled today and will reevaluate at follow-up.  Ophthalmology referral sent.

## 2024-02-26 NOTE — Assessment & Plan Note (Signed)
 Inquires about weight loss medications today.  Given that she is a type I diabetic, GLP-1 would not be covered and unfortunately out-of-pocket cost is not an option for her.  She will work on lifestyle modifications for now.

## 2024-02-26 NOTE — Progress Notes (Signed)
 Internal Medicine Clinic Attending  Case discussed with the resident at the time of the visit.  We reviewed the resident's history and exam and pertinent patient test results.  I agree with the assessment, diagnosis, and plan of care documented in the resident's note.

## 2024-02-26 NOTE — Assessment & Plan Note (Signed)
 Began approximately 3 months ago.  No inciting event/trauma.  Noticed it while lying in bed.  She feels as if initially there was some bruising around the area but now just experiences a strong dull ache that is worse with movement.  POC ultrasound showed potential minimal fluid surrounding trochanteric bursa.  She denies fevers.  Overall presentation consistent with mild trochanteric bursitis though cannot rule out tendinitis.  Fluid and symptoms are not consistent with something that should be tapped. She was counseled on limiting pressure on the affected area and will obtain OTC trochanteric brace in the meantime.  She continues to follow with orthopedics as she is status post ORIF for left ankle fracture in 2022.  Appointment is coming up, and I also asked her to have them weigh in on this as well.

## 2024-02-26 NOTE — Assessment & Plan Note (Signed)
 11/2023 TTE with EF of 45-50%.  No signs of volume overload on exam.  Denies orthopnea/PND/SOB.  Continue Toprol -XL 50 mg daily Aldactone  25 mg daily, Entresto  97-103 mg daily, and furosemide  40 mg daily as needed.  Not on SGLT2-I given that she is a type I diabetic.

## 2024-02-26 NOTE — Assessment & Plan Note (Signed)
 GI referral sent for screening colonoscopy.

## 2024-02-29 ENCOUNTER — Encounter: Payer: Self-pay | Admitting: Student

## 2024-03-17 ENCOUNTER — Other Ambulatory Visit (HOSPITAL_COMMUNITY): Payer: Self-pay | Admitting: Family Medicine

## 2024-03-17 DIAGNOSIS — E109 Type 1 diabetes mellitus without complications: Secondary | ICD-10-CM | POA: Diagnosis not present

## 2024-03-17 DIAGNOSIS — E1049 Type 1 diabetes mellitus with other diabetic neurological complication: Secondary | ICD-10-CM | POA: Diagnosis not present

## 2024-03-17 DIAGNOSIS — Z794 Long term (current) use of insulin: Secondary | ICD-10-CM | POA: Diagnosis not present

## 2024-03-17 DIAGNOSIS — E1065 Type 1 diabetes mellitus with hyperglycemia: Secondary | ICD-10-CM | POA: Diagnosis not present

## 2024-03-17 DIAGNOSIS — E108 Type 1 diabetes mellitus with unspecified complications: Secondary | ICD-10-CM | POA: Diagnosis not present

## 2024-03-18 ENCOUNTER — Other Ambulatory Visit (HOSPITAL_COMMUNITY): Payer: Self-pay

## 2024-03-18 MED ORDER — SACUBITRIL-VALSARTAN 97-103 MG PO TABS
1.0000 | ORAL_TABLET | Freq: Two times a day (BID) | ORAL | 5 refills | Status: DC
Start: 2024-03-18 — End: 2024-04-15

## 2024-04-14 NOTE — Progress Notes (Signed)
 Cardiology Office Note    Patient Name: Amanda Frederick Date of Encounter: 04/14/2024  Primary Care Provider:  Jolaine Pac, DO Primary Cardiologist:  Soyla DELENA Merck, MD Primary Electrophysiologist: None   Past Medical History    Past Medical History:  Diagnosis Date   AC (acromioclavicular) joint bone spurs    Anemia    Anxiety    Arthritis    Bronchitis    Bulging lumbar disc    Bursitis    CHF (congestive heart failure) (HCC)    Complication of anesthesia    has died 3 separate times under anesthesia   Depression    Diabetes (HCC)    Environmental allergies    Fibromyalgia    High blood pressure    High cholesterol    History of COVID-19 01/2020   was hospitalized   History of endometriosis    History of uterine fibroid    Migraines    Neuropathy    eyes , hands and feet    Pinched nerve    Pneumonia    Seizure (HCC)    after eye test only   Spondylosis    Tachycardia    after COVID   Tendonitis    Vision abnormalities     History of Present Illness  Amanda Frederick is a 53 y.o. female with a PMH of CAD s/p PCI left circumflex 01/2021, chronic combined CHF, DM type I, HTN, HLD, fibromyalgia, tachycardia who presents today for follow-up.  Amanda Frederick was seen initially in 2022 with progressive lower extremity edema and shortness of breath.  2D echo revealing EF of 25-30% with apical akinesis and global hypokinesis with grade 3 DD.  She completed R/LHC which demonstrated multivessel CAD with severe disease in her left circumflex and PCI/DES medical therapy.  She is currently followed by the advanced heart failure clinic after repeat 2D echo on 05/2021 showed EF of 30-35% and severe hypokinesis and mild MR.  She was continued on GDMT and cardiac MRI was ordered however declined by patient due to severe claustrophobia.  She was found not to be a candidate for CRT therapy.  She completed repeat 2D echo on 09/16/2021 with EF of 35-40% mild MR.  Order complete cardiac  MRI on 09/16/2021 with EF of 49% and delayed enhancement showing myocardial infarction pattern consistent with substantial LAD territory MI.  She was seen in follow-up in the advanced heart failure clinic by Dr. Cherrie ongoing dyspnea worsening chest pain with R/LHC completed on 09/20/2021 to evaluate cardiac anatomy.  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  She had no targets for PCI to be continued.  She wore a ZIO monitor in 06/21/2023 that showed nonsustained VT.  2D echo completed on 12/03/2023 with EF of 40-45% prior to patient being evaluated in advanced heart failure clinic.  She had Entresto  increased to 97/103 mg twice daily with discussion regarding cardiac rehab and benefits.  She will be completing a sleep consultation with [facility] neurology.  Amanda Frederick presents today with her husband for 39-month follow-up. She has experienced dizziness for several years, which has persisted despite adjustments to her blood pressure medication in May. Recently, she has fallen twice in the last two weeks due to dizziness, resulting in a broken toe. No loss of consciousness occurred during these falls. Her blood pressure at home has been low, with a recent reading of 82/60. She  is currently on Entresto , which was increased in May, and Lasix , which she takes as needed. She has been on a 40 mg dose of Lasix  for three years and recently took it daily for several consecutive days. She also takes spironolactone  and metoprolol .  She reports shortness of breath with minimal exertion which is not a new symptom for her. She reports chest pain and tightness, particularly when lying down at night, describing it as 'elephants are back'. She has a history of heart catheterization. She has type 1 diabetes and reports her blood sugars are not as well controlled as she would like, with a recent A1c  of 7.6. She experiences a rise in blood sugar levels at night, which normalizes by morning. She follows a diet with minimal processed foods and no added salt. She reports palpitations that occur without any specific trigger. She is on metoprolol . She has not noticed any correlation between her palpitations and her activities or medications. She does not engage in regular exercise but walks to feed animals. She experiences difficulty breathing in hot weather and limits her outdoor activities accordingly.  Discussed the use of AI scribe software for clinical note transcription with the patient, who gave verbal consent to proceed.  History of Present Illness   Review of Systems  Please see the history of present illness.    All other systems reviewed and are otherwise negative except as noted above.  Physical Exam    Wt Readings from Last 3 Encounters:  02/24/24 218 lb (98.9 kg)  12/03/23 215 lb 12.8 oz (97.9 kg)  12/03/23 216 lb 6.4 oz (98.2 kg)   CD:Uyzmz were no vitals filed for this visit.,There is no height or weight on file to calculate BMI. GEN: Well nourished, well developed in no acute distress Neck: No JVD; No carotid bruits Pulmonary: Clear to auscultation without rales, wheezing or rhonchi  Cardiovascular: Normal rate. Regular rhythm. Normal S1. Normal S2.   Murmurs: There is no murmur.  ABDOMEN: Soft, non-tender, non-distended EXTREMITIES: +1 bilateral edema  EKG/LABS/ Recent Cardiac Studies   ECG personally reviewed by me today -none completed today  Risk Assessment/Calculations:        Lab Results  Component Value Date   WBC 8.5 11/05/2021   HGB 14.3 11/05/2021   HCT 42.9 11/05/2021   MCV 83.5 11/05/2021   PLT 266 11/05/2021   Lab Results  Component Value Date   CREATININE 0.89 11/16/2023   BUN 22 (H) 11/16/2023   NA 137 11/16/2023   K 4.0 11/16/2023   CL 105 11/16/2023   CO2 21 (L) 11/16/2023   Lab Results  Component Value Date   CHOL 136 06/10/2023    HDL 45 06/10/2023   LDLCALC 59 06/10/2023   LDLDIRECT 73 06/16/2022   TRIG 161 (H) 06/10/2023   CHOLHDL 3.0 06/10/2023    Lab Results  Component Value Date   HGBA1C 7.6 (A) 02/24/2024   Assessment & Plan    Assessment & Plan  1.  Chronic combined CHF/ICM: - 2D echo completed on 12/03/2023 with EF of 40-45% follow-up in advanced heart failure clinic Entresto  increased to 97/105 mg twice daily. - Today patient reports shortness of breath with exertion that is unchanged from her baseline. -She was euvolemic on examination with clear lung sounds but reports ongoing dizziness with hypotension. -MCT and BNP today - We will decrease Entresto  to 49/51 mg twice daily due to hypotension - Patient will continue current GDMT with Toprol -XL 50 mg twice daily, spironolactone   12.5 mg daily, potassium 20 mEq daily - She is planning to schedule a follow-up in the advanced heart failure clinic in October.  2.  History of CAD: -s/p R/LHC completed 08/2021 showing progressive disease of LAD with diffuse diabetic changes and currently treated medically with no targets for PCI. - Today patient reports an increase in chest heaviness and pain with activity. - Order PET stress test at [facility] to assess blood flow and potential progression of heart disease. - Prescribe nitroglycerin  tablets for chest pain management. - Continue current GDMT with Plavix  75 mg daily, Lipitor  80 mg daily, Toprol -XL 50 mg twice daily - Patient was to go to the ED if chest pain is increased and not resolved with.  Nitroglycerin .  3.  Hyperlipidemia: - Patient's labs LDL cholesterol was 59 - Continue Lipitor  80 mg daily  4.  DM type I: - Insulin  pump in place with last hemoglobin A1c 7.6 - Continue current treatment plan per endocrinology - Endocrinologist to reassess A1c in October.  5.  Essential hypertension: - Patient BP was low initially at 82/60 and was 102/68 on recheck. - We will reduce Entresto  as noted above and  patient advised to continue spironolactone  12.5 mg and Toprol -XL 50 mg twice daily  6.  Sleep disturbance: - Patient is advised by neurologist to pursue sleep study to rule out OSA however no follow-up was made from their office. - We will schedule patient for split-night sleep study with STOP-BANG of 8   5.  Palpitations: - Patient reports ongoing palpitations that are fleeting and appear to be controlled with current dose of blocker. - Continue Toprol -XL 50 mg twice daily  Disposition: Follow-up with Soyla DELENA Merck, MD or APP in 2 months Informed Consent   Shared Decision Making/Informed Consent The risks [chest pain, shortness of breath, cardiac arrhythmias, dizziness, blood pressure fluctuations, myocardial infarction, stroke/transient ischemic attack, nausea, vomiting, allergic reaction, radiation exposure, metallic taste sensation and life-threatening complications (estimated to be 1 in 10,000)], benefits (risk stratification, diagnosing coronary artery disease, treatment guidance) and alternatives of a cardiac PET stress test were discussed in detail with Amanda Frederick and she agrees to proceed.      Signed, Wyn Raddle, Jackee Shove, NP 04/14/2024, 1:07 PM [facility]

## 2024-04-15 ENCOUNTER — Ambulatory Visit: Attending: Nurse Practitioner | Admitting: Nurse Practitioner

## 2024-04-15 ENCOUNTER — Encounter: Payer: Self-pay | Admitting: Nurse Practitioner

## 2024-04-15 VITALS — BP 102/68 | HR 100 | Ht 66.0 in | Wt 216.6 lb

## 2024-04-15 DIAGNOSIS — I5022 Chronic systolic (congestive) heart failure: Secondary | ICD-10-CM

## 2024-04-15 DIAGNOSIS — I255 Ischemic cardiomyopathy: Secondary | ICD-10-CM | POA: Diagnosis not present

## 2024-04-15 DIAGNOSIS — R400 Somnolence: Secondary | ICD-10-CM

## 2024-04-15 DIAGNOSIS — I2510 Atherosclerotic heart disease of native coronary artery without angina pectoris: Secondary | ICD-10-CM

## 2024-04-15 DIAGNOSIS — E109 Type 1 diabetes mellitus without complications: Secondary | ICD-10-CM | POA: Diagnosis not present

## 2024-04-15 DIAGNOSIS — I10 Essential (primary) hypertension: Secondary | ICD-10-CM | POA: Diagnosis not present

## 2024-04-15 DIAGNOSIS — E782 Mixed hyperlipidemia: Secondary | ICD-10-CM | POA: Diagnosis not present

## 2024-04-15 MED ORDER — SACUBITRIL-VALSARTAN 49-51 MG PO TABS: 1.0000 | ORAL_TABLET | Freq: Two times a day (BID) | ORAL | 3 refills | Status: AC

## 2024-04-15 NOTE — Patient Instructions (Addendum)
 Medication Instructions:  DECREASE Entresto  to 49/51mg  Take 1 tablet twice a day *If you need a refill on your cardiac medications before your next appointment, please call your pharmacy*  Lab Work: TODAY-BMET & BNP If you have labs (blood work) drawn today and your tests are completely normal, you will receive your results only by: MyChart Message (if you have MyChart) OR A paper copy in the mail If you have any lab test that is abnormal or we need to change your treatment, we will call you to review the results.  Testing/Procedures: Your physician has recommended that you have a sleep study. This test records several body functions during sleep, including: brain activity, eye movement, oxygen and carbon dioxide blood levels, heart rate and rhythm, breathing rate and rhythm, the flow of air through your mouth and nose, snoring, body muscle movements, and chest and belly movement.  PET STRESS TEST  Follow-Up: At [facility], you and your health needs are our priority.  As part of our continuing mission to provide you with exceptional heart care, our providers are all part of one team.  This team includes your primary Cardiologist (physician) and Advanced Practice Providers or APPs (Physician Assistants and Nurse Practitioners) who all work together to provide you with the care you need, when you need it.  Your next appointment:   2 month(s)  Provider:   Gayatri A Acharya, MD or APP   We recommend signing up for the patient portal called MyChart.  Sign up information is provided on this After Visit Summary.  MyChart is used to connect with patients for Virtual Visits (Telemedicine).  Patients are able to view lab/test results, encounter notes, upcoming appointments, etc.  Non-urgent messages can be sent to your provider as well.   To learn more about what you can do with MyChart, go to [email or web address].   Other Instructions   Check your blood pressure daily for 2  weeks, then contact the office with your readings.  Contact the office either by phone or MyChart with your readings.  Make sure to check your blood pressure 2 hours after taking your medications.   AVOID these things for 30 minutes before checking your blood pressure: No Drinking caffeine . No Drinking alcohol. No Eating. No Smoking. No Exercising.  Five minutes before checking your blood pressure: Pee. Sit in a dining chair. Avoid sitting in a soft couch or armchair. Be quiet. Do not talk.       Please report to Radiology at the [facility] Main Entrance 30 minutes early for your test.  [address] [place], [place] [address]                         OR   Please report to Radiology at [facility] Main Entrance, medical mall, 30 mins prior to your test.  [address]  [place], [place]  How to Prepare for Your Cardiac PET/CT Stress Test:  Nothing to eat or drink, except water, 3 hours prior to arrival time.  NO caffeine /decaffeinated products, or chocolate 12 hours prior to arrival. (Please note decaffeinated beverages (teas/coffees) still contain caffeine ).  If you have caffeine  within 12 hours prior, the test will need to be rescheduled.  Medication instructions: Do not take erectile dysfunction medications for 72 hours prior to test (sildenafil, tadalafil) Do not take nitrates (isosorbide  mononitrate, Ranexa) the day before or day of test Do not take tamsulosin the day  before or morning of test Hold theophylline containing medications for 12 hours. Hold Dipyridamole 48 hours prior to the test.  Diabetic Preparation: If able to eat breakfast prior to 3 hour fasting, you may take all medications, including your insulin . Do not worry if you miss your breakfast dose of insulin  - start at your next meal. If you do not eat prior to 3 hour fast-Hold all diabetes (oral and insulin ) medications. Patients who wear a continuous glucose  monitor MUST remove the device prior to scanning.  You may take your remaining medications with water.  NO perfume, cologne or lotion on chest or abdomen area. FEMALES - Please avoid wearing dresses to this appointment.  Total time is 1 to 2 hours; you may want to bring reading material for the waiting time.  IF YOU THINK YOU MAY BE PREGNANT, OR ARE NURSING PLEASE INFORM THE TECHNOLOGIST.  In preparation for your appointment, medication and supplies will be purchased.  Appointment availability is limited, so if you need to cancel or reschedule, please call the Radiology Department Scheduler at [phone] 24 hours in advance to avoid a cancellation fee of $100.00  What to Expect When you Arrive:  Once you arrive and check in for your appointment, you will be taken to a preparation room within the Radiology Department.  A technologist or Nurse will obtain your medical history, verify that you are correctly prepped for the exam, and explain the procedure.  Afterwards, an IV will be started in your arm and electrodes will be placed on your skin for EKG monitoring during the stress portion of the exam. Then you will be escorted to the PET/CT scanner.  There, staff will get you positioned on the scanner and obtain a blood pressure and EKG.  During the exam, you will continue to be connected to the EKG and blood pressure machines.  A small, safe amount of a radioactive tracer will be injected in your IV to obtain a series of pictures of your heart along with an injection of a stress agent.    After your Exam:  It is recommended that you eat a meal and drink a caffeinated beverage to counter act any effects of the stress agent.  Drink plenty of fluids for the remainder of the day and urinate frequently for the first couple of hours after the exam.  Your doctor will inform you of your test results within 7-10 business days.  For more information and frequently asked questions, please visit our  website: [email or web address]  For questions about your test or how to prepare for your test, please call: Cardiac Imaging Nurse Navigators Office: [phone]

## 2024-04-16 ENCOUNTER — Ambulatory Visit: Payer: Self-pay | Admitting: Nurse Practitioner

## 2024-04-16 LAB — BASIC METABOLIC PANEL WITH GFR
BUN/Creatinine Ratio: 20 (ref 9–23)
BUN: 19 mg/dL (ref 6–24)
CO2: 19 mmol/L — ABNORMAL LOW (ref 20–29)
Calcium: 9.8 mg/dL (ref 8.7–10.2)
Chloride: 104 mmol/L (ref 96–106)
Creatinine, Ser: 0.96 mg/dL (ref 0.57–1.00)
Glucose: 124 mg/dL — ABNORMAL HIGH (ref 70–99)
Potassium: 4.5 mmol/L (ref 3.5–5.2)
Sodium: 140 mmol/L (ref 134–144)
eGFR: 71 mL/min/1.73 (ref >59)

## 2024-04-16 LAB — PRO B NATRIURETIC PEPTIDE: NT-Pro BNP: 45 pg/mL (ref 0–249)

## 2024-04-18 ENCOUNTER — Telehealth: Payer: Self-pay

## 2024-04-18 NOTE — Telephone Encounter (Signed)
**Note De-Identified Deserea Bordley Obfuscation** Per the [facility] provider portal, this Split Night Sleep Study has been approved from 04/18/2024-10/15/2024. Authorization Number: [id number] Case Number: [id number]   I have transferred the order to the sleep lab. I called the pt to advise her of this approval and to give her the sleep lab's phone number but I got no answer so I left a message on her VM advising her that I have sent her a [facility] message and that if she has any questions or concerns after reading it to call Macario at Jackee Alberts, NP's office at [facility] at [phone].

## 2024-04-29 ENCOUNTER — Encounter (HOSPITAL_COMMUNITY): Payer: Self-pay

## 2024-04-29 ENCOUNTER — Telehealth: Payer: Self-pay | Admitting: Internal Medicine

## 2024-04-29 NOTE — Telephone Encounter (Signed)
 Patient called to report her BP readings as requested.  9/19, 12:51 pm  -  122/81  HR 96 9/20, 2:28 pm  -  108/75  HR 94 9/21, 1:00 pm  - 107/75  HR 94 9/22, 12:25 pm  - 120/82  HR 87 9/23, 11:41 am  - 127/88  HR 110 9/24, 9:52 am  -  88/69  HR 88 9/25, 3:32 pm  - 10/68  HR 93 9/26, no time   - 93/61  HR 101 9/27, 3:23 pm  -  89/73  HR 92 9/28, 12:45 pm  - 154/91  HR 111 9/29, 10:15 am  - 121/83  HR 94 9/30, 10:22 am  - 112/78  HR 90 10/1, 10:02 am  - 123/80  HR 92 10/2, 10:35 am  - 133/91  HR 94 10/3, 10:50 am  - 94/75 HR 103

## 2024-05-03 ENCOUNTER — Ambulatory Visit (HOSPITAL_COMMUNITY): Admission: RE | Admit: 2024-05-03 | Source: Ambulatory Visit

## 2024-05-03 DIAGNOSIS — E103392 Type 1 diabetes mellitus with moderate nonproliferative diabetic retinopathy without macular edema, left eye: Secondary | ICD-10-CM | POA: Diagnosis not present

## 2024-05-03 DIAGNOSIS — E103311 Type 1 diabetes mellitus with moderate nonproliferative diabetic retinopathy with macular edema, right eye: Secondary | ICD-10-CM | POA: Diagnosis not present

## 2024-05-03 LAB — OPHTHALMOLOGY REPORT-SCANNED

## 2024-05-05 NOTE — Progress Notes (Unsigned)
 05/05/2024 Amanda Frederick [id number] 1970/11/21  Referring provider: Jolaine Pac, DO Primary GI doctor: Dr. Stacia  ASSESSMENT AND PLAN:  Chronic diarrhea 2024 negative sed rate CRP fecal lactoferrin ferritin fecal elastase and celiac 02/2023 trial of amitriptyline , Colesytramine Consider Viberzi or rifaximin  Poorly controlled type I diabetic 01/28/2024 A1c 7.6  Chronic stable angina with CAD Status post DES LCx July 2022 February 2023 LHC diffuse diabetic disease mid and distal LAD no target for interventional particular given lack of viability continue medical therapy, chronic stable angina  HFrEF Echocardiogram 12/03/2023 ejection fraction 45 to 50% grade 2 diastolic dysfunction normal RVSP mild to moderate MR no aortic stenosis  Patient Care Team: Jolaine Pac, DO as PCP - General Loni Soyla LABOR, MD as PCP - Cardiology (Cardiology) Braulio Hough, MD as Referring Physician (Internal Medicine)  HISTORY OF PRESENT ILLNESS: 53 y.o. female with a past medical history listed below presents for evaluation of ***.   Patient last seen in the office 03/11/2023 by Dr. Stacia diarrhea and abdominal pain.  *** Discussed the use of AI scribe software for clinical note transcription with the patient, who gave verbal consent to proceed.  History of Present Illness            She  reports that she has never smoked. She has never used smokeless tobacco. She reports that she does not currently use alcohol. She reports that she does not use drugs.  RELEVANT GI HISTORY, IMAGING AND LABS: Results          CBC    Component Value Date/Time   WBC 8.5 11/05/2021 1446   RBC 5.14 (H) 11/05/2021 1446   HGB 14.3 11/05/2021 1446   HGB 15.0 09/13/2021 1409   HCT 42.9 11/05/2021 1446   HCT 45.2 09/13/2021 1409   PLT 266 11/05/2021 1446   PLT 271 09/13/2021 1409   MCV 83.5 11/05/2021 1446   MCV 85 09/13/2021 1409   MCH 27.8 11/05/2021 1446   MCHC 33.3 11/05/2021  1446   RDW 13.1 11/05/2021 1446   RDW 12.5 09/13/2021 1409   LYMPHSABS 1.2 02/15/2021 0409   MONOABS 0.7 02/15/2021 0409   EOSABS 0.0 02/15/2021 0409   BASOSABS 0.0 02/15/2021 0409   No results for input(s): HGB in the last 8760 hours.  CMP     Component Value Date/Time   NA 140 04/15/2024 0933   K 4.5 04/15/2024 0933   CL 104 04/15/2024 0933   CO2 19 (L) 04/15/2024 0933   GLUCOSE 124 (H) 04/15/2024 0933   GLUCOSE 229 (H) 11/16/2023 1029   BUN 19 04/15/2024 0933   CREATININE 0.96 04/15/2024 0933   CALCIUM  9.8 04/15/2024 0933   PROT 6.4 (L) 06/10/2023 0947   ALBUMIN 3.4 (L) 06/10/2023 0947   AST 18 06/10/2023 0947   ALT 17 06/10/2023 0947   ALKPHOS 93 06/10/2023 0947   BILITOT 0.3 06/10/2023 0947   GFRNONAA >60 11/16/2023 1029   GFRAA >60 03/07/2020 0130      Latest Ref Rng & Units 06/10/2023    9:47 AM 06/16/2022    9:54 AM 11/05/2021    2:46 PM  Hepatic Function  Total Protein 6.5 - 8.1 g/dL 6.4  6.9  6.6   Albumin 3.5 - 5.0 g/dL 3.4  4.2  3.7   AST 15 - 41 U/L 18  24  21    ALT 0 - 44 U/L 17  26  27    Alk Phosphatase 38 - 126 U/L 93  111  64   Total Bilirubin <1.2 mg/dL 0.3  1.5  0.7       Current Medications:   Current Outpatient Medications (Endocrine & Metabolic):    NOVOLOG  100 UNIT/ML injection, TAKE UP TO 200 UNITS PER DAY VIA THE PUMP AS INSTRUCTED   Current Outpatient Medications (Cardiovascular):    atorvastatin  (LIPITOR ) 80 MG tablet, TAKE 1 TABLET BY MOUTH EVERY DAY   cholestyramine  (QUESTRAN ) 4 g packet, as needed.   furosemide  (LASIX ) 40 MG tablet, Take 1 tablet (40 mg total) by mouth daily as needed.   metoprolol  succinate (TOPROL -XL) 50 MG 24 hr tablet, TAKE 1 TABLET BY MOUTH TWICE A DAY   sacubitril -valsartan  (ENTRESTO ) 49-51 MG, Take 1 tablet by mouth 2 (two) times daily.   spironolactone  (ALDACTONE ) 25 MG tablet, TAKE 0.5 TABLET (12.5 MG TOTAL) BY MOUTH DAILY. NEEDS FOLLOW UP APPOINTMENT FOR ANYMORE REFILLS   Current Outpatient  Medications (Respiratory):    fluticasone  (FLONASE ) 50 MCG/ACT nasal spray, INSTILL 1 SPRAY INTO BOTH NOSTRILS DAILY   ipratropium-albuterol  (DUONEB) 0.5-2.5 (3) MG/3ML SOLN, Take 3 mLs by nebulization every 4 (four) hours as needed (wheezing).     Current Outpatient Medications (Hematological):    clopidogrel  (PLAVIX ) 75 MG tablet, TAKE 1 TABLET BY MOUTH EVERY DAY   vitamin B-12 (CYANOCOBALAMIN ) 500 MCG tablet, Take 500 mcg by mouth daily.   Current Outpatient Medications (Other):    Accu-Chek Softclix Lancets lancets, 3 (three) times daily.   amitriptyline  (ELAVIL ) 10 MG tablet, TAKE 1 TABLET BY MOUTH EVERYDAY AT BEDTIME   cholecalciferol  (VITAMIN D ) 1000 UNITS tablet, Take 1,000 Units by mouth daily.   Continuous Blood Gluc Receiver (DEXCOM G7 RECEIVER) DEVI, 1 each by Does not apply route as needed.   Continuous Glucose Sensor (DEXCOM G7 SENSOR) MISC, Use to monitor blood sugar as directed   dicyclomine  (BENTYL ) 20 MG tablet, Take 1 tablet (20 mg total) by mouth every 6 (six) hours as needed for spasms. Please schedule a yearly follow up for further refills. Thank you   DULoxetine  (CYMBALTA ) 30 MG capsule, TAKE 1 CAPSULE BY MOUTH EVERY DAY   glucose blood (BAYER CONTOUR NEXT TEST) test strip, Test 8-10 times per day 8-10 lancets/day   Insulin  Infusion Pump (T:SLIM X2 CONTROL-IQ PUMP) DEVI, 1 each by Does not apply route as needed.   Insulin  Infusion Pump Supplies (PARADIGM QUICK-SET 32 ) MISC, Use with novolog  in insulin  pump   Insulin  Infusion Pump Supplies (T:SLIM X2 CARTRIDGE) MISC, Use with insulin  pump   Insulin  Infusion Pump Supplies (T:SLIM X2/CONTROL-IQ/ACC/INSTR) MISC, Use with insulin  pump   loperamide  (IMODIUM ) 2 MG capsule, Take 1 capsule (2 mg total) by mouth as needed for diarrhea or loose stools. Take 2 mg after every loose bowel movement with a maximum of 16 mg a day (maximum of 8 pills a day)   Magnesium  250 MG TABS, Take 250 mg by mouth daily.   Misc Natural  Products (BEET ROOT PO), Take 500 mg by mouth daily.   Multiple Vitamin (MULTIVITAMIN) capsule, Take 1 capsule by mouth daily.   potassium chloride  SA (KLOR-CON  M20) 20 MEQ tablet, TAKE 1 TABLET BY MOUTH EVERY DAY   Facility-Administered Medications Ordered in Other Visits (Other):    sodium chloride  flush (NS) 0.9 % injection 3 mL No current facility-administered medications for this visit.  Medical History:  Past Medical History:  Diagnosis Date   AC (acromioclavicular) joint bone spurs    Anemia    Anxiety    Arthritis  Bronchitis    Bulging lumbar disc    Bursitis    CHF (congestive heart failure) (HCC)    Complication of anesthesia    has died 3 separate times under anesthesia   Depression    Diabetes (HCC)    Environmental allergies    Fibromyalgia    High blood pressure    High cholesterol    History of COVID-19 01/2020   was hospitalized   History of endometriosis    History of uterine fibroid    Migraines    Neuropathy    eyes , hands and feet    Pinched nerve    Pneumonia    Seizure (HCC)    after eye test only   Spondylosis    Tachycardia    after COVID   Tendonitis    Vision abnormalities    Allergies:  Allergies  Allergen Reactions   Fentanyl  Nausea And Vomiting   Other Hives    Antibiotic specific one unsure but has tolerated antibiotic recently. Reaction was about 20 years ago.   Wound Dressing Adhesive Other (See Comments)    Blisters  Other Reaction(s): blisters     Surgical History:  She  has a past surgical history that includes Vaginal hysterectomy; Tubal ligation; Nasal sinus surgery; ORIF ankle fracture (Left, 01/04/2021); RIGHT/LEFT HEART CATH AND CORONARY ANGIOGRAPHY (N/A, 02/19/2021); CORONARY STENT INTERVENTION (N/A, 02/19/2021); and RIGHT/LEFT HEART CATH AND CORONARY ANGIOGRAPHY (N/A, 09/20/2021). Family History:  Her family history includes Anemia in her mother; Arthritis in her mother; Diabetes in her mother; Diabetes type I in  her maternal grandmother; High Cholesterol in her mother.  REVIEW OF SYSTEMS  : All other systems reviewed and negative except where noted in the History of Present Illness.  PHYSICAL EXAM: There were no vitals taken for this visit. Physical Exam          Alan JONELLE Coombs, PA-C 1:14 PM

## 2024-05-06 ENCOUNTER — Ambulatory Visit (INDEPENDENT_AMBULATORY_CARE_PROVIDER_SITE_OTHER)
Admission: RE | Admit: 2024-05-06 | Discharge: 2024-05-06 | Disposition: A | Discharge status: Home / Self Care | Source: Ambulatory Visit | Attending: Physician Assistant | Admitting: Physician Assistant

## 2024-05-06 ENCOUNTER — Ambulatory Visit: Admitting: Physician Assistant

## 2024-05-06 ENCOUNTER — Other Ambulatory Visit (INDEPENDENT_AMBULATORY_CARE_PROVIDER_SITE_OTHER)

## 2024-05-06 ENCOUNTER — Encounter: Payer: Self-pay | Admitting: Physician Assistant

## 2024-05-06 VITALS — BP 102/68 | HR 88 | Ht 64.5 in | Wt 218.0 lb

## 2024-05-06 DIAGNOSIS — G8929 Other chronic pain: Secondary | ICD-10-CM | POA: Diagnosis not present

## 2024-05-06 DIAGNOSIS — K580 Irritable bowel syndrome with diarrhea: Secondary | ICD-10-CM | POA: Diagnosis not present

## 2024-05-06 DIAGNOSIS — R197 Diarrhea, unspecified: Secondary | ICD-10-CM

## 2024-05-06 DIAGNOSIS — R1310 Dysphagia, unspecified: Secondary | ICD-10-CM

## 2024-05-06 DIAGNOSIS — R1084 Generalized abdominal pain: Secondary | ICD-10-CM | POA: Diagnosis not present

## 2024-05-06 DIAGNOSIS — R1013 Epigastric pain: Secondary | ICD-10-CM

## 2024-05-06 DIAGNOSIS — I5022 Chronic systolic (congestive) heart failure: Secondary | ICD-10-CM

## 2024-05-06 DIAGNOSIS — K529 Noninfective gastroenteritis and colitis, unspecified: Secondary | ICD-10-CM | POA: Diagnosis not present

## 2024-05-06 DIAGNOSIS — I2510 Atherosclerotic heart disease of native coronary artery without angina pectoris: Secondary | ICD-10-CM

## 2024-05-06 DIAGNOSIS — R21 Rash and other nonspecific skin eruption: Secondary | ICD-10-CM

## 2024-05-06 DIAGNOSIS — R109 Unspecified abdominal pain: Secondary | ICD-10-CM | POA: Diagnosis not present

## 2024-05-06 LAB — COMPREHENSIVE METABOLIC PANEL WITH GFR
ALT: 14 U/L (ref 0–35)
AST: 13 U/L (ref 0–37)
Albumin: 4.5 g/dL (ref 3.5–5.2)
Alkaline Phosphatase: 107 U/L (ref 39–117)
BUN: 19 mg/dL (ref 6–23)
CO2: 26 meq/L (ref 19–32)
Calcium: 9.6 mg/dL (ref 8.4–10.5)
Chloride: 102 meq/L (ref 96–112)
Creatinine, Ser: 0.91 mg/dL (ref 0.40–1.20)
GFR: 71.85 mL/min (ref >60.00)
Glucose, Bld: 234 mg/dL — ABNORMAL HIGH (ref 70–99)
Potassium: 4.1 meq/L (ref 3.5–5.1)
Sodium: 139 meq/L (ref 135–145)
Total Bilirubin: 0.4 mg/dL (ref 0.2–1.2)
Total Protein: 7.4 g/dL (ref 6.0–8.3)

## 2024-05-06 LAB — CBC WITH DIFFERENTIAL/PLATELET
Basophils Absolute: 0 K/uL (ref 0.0–0.1)
Basophils Relative: 0.5 % (ref 0.0–3.0)
Eosinophils Absolute: 0.1 K/uL (ref 0.0–0.7)
Eosinophils Relative: 2 % (ref 0.0–5.0)
HCT: 43.2 % (ref 36.0–46.0)
Hemoglobin: 14.4 g/dL (ref 12.0–15.0)
Lymphocytes Relative: 24.8 % (ref 12.0–46.0)
Lymphs Abs: 1.7 K/uL (ref 0.7–4.0)
MCHC: 33.3 g/dL (ref 30.0–36.0)
MCV: 84.2 fl (ref 78.0–100.0)
Monocytes Absolute: 0.5 K/uL (ref 0.1–1.0)
Monocytes Relative: 6.7 % (ref 3.0–12.0)
Neutro Abs: 4.6 K/uL (ref 1.4–7.7)
Neutrophils Relative %: 66 % (ref 43.0–77.0)
Platelets: 277 K/uL (ref 150.0–400.0)
RBC: 5.14 Mil/uL — ABNORMAL HIGH (ref 3.87–5.11)
RDW: 14 % (ref 11.5–15.5)
WBC: 7 K/uL (ref 4.0–10.5)

## 2024-05-06 LAB — SEDIMENTATION RATE: Sed Rate: 19 mm/h (ref 0–30)

## 2024-05-06 LAB — IBC + FERRITIN
Ferritin: 42.4 ng/mL (ref 10.0–291.0)
Iron: 87 ug/dL (ref 42–145)
Saturation Ratios: 23.4 % (ref 20.0–50.0)
TIBC: 372.4 ug/dL (ref 250.0–450.0)
Transferrin: 266 mg/dL (ref 212.0–360.0)

## 2024-05-06 MED ORDER — PANTOPRAZOLE SODIUM 40 MG PO TBEC: 40.0000 mg | DELAYED_RELEASE_TABLET | Freq: Every day | ORAL | 3 refills | Status: AC

## 2024-05-06 MED ORDER — CHOLESTYRAMINE 4 G PO PACK: 4.0000 g | PACK | Freq: Two times a day (BID) | ORAL | 4 refills | Status: DC

## 2024-05-06 NOTE — Patient Instructions (Addendum)
 Your provider has requested that you go to the basement level for lab work before leaving today. Press B on the elevator. The lab is located at the first door on the left as you exit the elevator.  Your provider has requested that you have an abdominal x ray before leaving today. Please go to the basement floor to our Radiology department for the test.  Gastroparesis Gastroparesis is a condition in which food takes longer than normal to empty from the stomach.  This condition is also known as delayed gastric emptying. It is usually a long-term (chronic) condition.  What are the signs or symptoms? Symptoms of this condition include: Feeling full after eating very little or a loss of appetite. Nausea, vomiting, or heartburn. Bloating of your abdomen. Inconsistent blood sugar (glucose) levels on blood tests. Unexplained weight loss. Acid from the stomach coming up into the esophagus (gastroesophageal reflux). Sudden tightening (spasm) of the stomach, which can be painful. Symptoms may come and go. Some people may not notice any symptoms.  What increases the risk? You are more likely to develop this condition if: You have certain disorders or diseases. These may include: An endocrine disorder. An eating disorder. Amyloidosis. Scleroderma. Parkinson's disease. Multiple sclerosis. Cancer or infection of the stomach or the vagus nerve. You have had surgery on your stomach or vagus nerve. You take certain medicines. You are female.  Things you can do: Please do small frequent meals like 4-6 meals a day.  Eat and drink liquids at separate times.  Avoid high fiber foods, cook your vegetables, avoid high fat food.  Suggest spreading protein throughout the day (greek yogurt, glucerna, soft meat, milk, eggs) Choose soft foods that you can mash with a fork When you are more symptomatic, change to pureed foods foods and liquids.  Consider reading Living well with Gastroparesis by [facility] Check out this link to a diet online [email or web address].GroupJournal.fr  Please take your proton pump inhibitor medication, pantoprazole  20 mg daily  Please take this medication 30 minutes to 1 hour before meals- this makes it more effective.  Avoid spicy and acidic foods Avoid fatty foods Limit your intake of coffee, tea, alcohol, and carbonated drinks Work to maintain a healthy weight Keep the head of the bed elevated at least 3 inches with blocks or a wedge pillow if you are having any nighttime symptoms Stay upright for 2 hours after eating Avoid meals and snacks three to four hours before bedtime  VISIT SUMMARY:  During your visit, we discussed your gastrointestinal symptoms, chest pain, and overall health. You have been experiencing significant gastrointestinal issues, including frequent bowel movements, abdominal pain, and difficulty swallowing. We also reviewed your history of diabetes and coronary artery disease, noting your improved diabetes management. We have developed a plan to address your symptoms and improve your overall health.  YOUR PLAN:  -CHRONIC DIARRHEA WITH STEATORRHEA AND NOCTURNAL SYMPTOMS: Chronic diarrhea with oily stools and nighttime symptoms suggests bile acid malabsorption and possible exocrine pancreatic insufficiency. We will increase your Questran  (cholestyramine ) to twice daily, recheck your pancreatic elastase stool study, and order an abdominal ultrasound and x-ray.  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 We will recheck your pancreatic elastase stool study.  -ABDOMINAL PAIN (EPIGASTRIC AND RIGHT UPPER QUADRANT): You have intermittent pain in the upper middle and right side of your abdomen, often associated with bowel movements. We will order an abdominal  ultrasound to  investigate further.  -SUSPECTED GASTROPARESIS: Gastroparesis is a condition where your stomach empties slowly, causing symptoms like early fullness and reflux. We will provide you with a gastroparesis diet and order a barium swallow study to assess your gastric emptying.  -DYSPHAGIA (SOLIDS AND LIQUIDS): Dysphagia means difficulty swallowing. We will order a barium swallow study to evaluate this issue.  -GASTROESOPHAGEAL REFLUX DISEASE (GERD): GERD is a condition where stomach acid frequently flows back into the tube connecting your mouth and stomach, causing heartburn. We will order a barium swallow study to assess for reflux.  -CORONARY ARTERY DISEASE WITH CHRONIC STABLE ANGINA: Chronic stable angina is chest pain due to reduced blood flow to the heart, often related to coronary artery disease. We will discuss with Dr. Stacia regarding cardiac clearance for an EGD and colonoscopy and coordinate with cardiology for these procedures in a hospital setting.  -TYPE 2 DIABETES MELLITUS, IMPROVED CONTROL: Your diabetes management has significantly improved, with your A1c levels decreasing from 14 to 7.6. This is crucial for managing your coronary artery disease and potential gastroparesis. Continue your current diabetes management.  INSTRUCTIONS:  Please follow up with the recommended tests and procedures, including the abdominal ultrasound, x-ray, pancreatic elastase stool study, and barium swallow study. We will also coordinate with cardiology for potential EGD and colonoscopy in a hospital setting. Continue taking your medications as prescribed and follow the gastroparesis diet provided.  You have been scheduled for a Barium Esophogram at [facility] (1st floor of the hospital) on 05/13/2024 at 10:30 am. Please arrive 30 minutes prior to your appointment for registration. Make certain not to have anything to eat or drink 3 hours prior to your test. If you need to reschedule  for any reason, please contact radiology at [phone] to do so. __________________________________________________________________ A barium swallow is an examination that concentrates on views of the esophagus. This tends to be a double contrast exam (barium and two liquids which, when combined, create a gas to distend the wall of the oesophagus) or single contrast (non-ionic iodine based). The study is usually tailored to your symptoms so a good history is essential. Attention is paid during the study to the form, structure and configuration of the esophagus, looking for functional disorders (such as aspiration, dysphagia, achalasia, motility and reflux) EXAMINATION You may be asked to change into a gown, depending on the type of swallow being performed. A radiologist and radiographer will perform the procedure. The radiologist will advise you of the type of contrast selected for your procedure and direct you during the exam. You will be asked to stand, sit or lie in several different positions and to hold a small amount of fluid in your mouth before being asked to swallow while the imaging is performed .In some instances you may be asked to swallow barium coated marshmallows to assess the motility of a solid food bolus. The exam can be recorded as a digital or video fluoroscopy procedure. POST PROCEDURE It will take 1-2 days for the barium to pass through your system. To facilitate this, it is important, unless otherwise directed, to increase your fluids for the next 24-48hrs and to resume your normal diet.  This test typically takes about 30 minutes to perform. __________________________________________________________________________________   Due to recent changes in healthcare laws, you may see the results of your imaging and laboratory studies on MyChart before your provider has had a chance to review them.  We understand that in some cases there may be results that are confusing  or concerning  to you. Not all laboratory results come back in the same time frame and the provider may be waiting for multiple results in order to interpret others.  Please give us  48 hours in order for your provider to thoroughly review all the results before contacting the office for clarification of your results.    I appreciate the  opportunity to care for you  Thank You   [facility]

## 2024-05-07 ENCOUNTER — Other Ambulatory Visit: Payer: Self-pay | Admitting: Student

## 2024-05-07 DIAGNOSIS — F419 Anxiety disorder, unspecified: Secondary | ICD-10-CM

## 2024-05-09 ENCOUNTER — Other Ambulatory Visit

## 2024-05-09 ENCOUNTER — Ambulatory Visit: Payer: Self-pay | Admitting: Physician Assistant

## 2024-05-09 DIAGNOSIS — R197 Diarrhea, unspecified: Secondary | ICD-10-CM | POA: Diagnosis not present

## 2024-05-09 DIAGNOSIS — G8929 Other chronic pain: Secondary | ICD-10-CM | POA: Diagnosis not present

## 2024-05-09 DIAGNOSIS — R1084 Generalized abdominal pain: Secondary | ICD-10-CM | POA: Diagnosis not present

## 2024-05-09 DIAGNOSIS — K580 Irritable bowel syndrome with diarrhea: Secondary | ICD-10-CM | POA: Diagnosis not present

## 2024-05-09 DIAGNOSIS — R1013 Epigastric pain: Secondary | ICD-10-CM | POA: Diagnosis not present

## 2024-05-11 LAB — FECAL FAT, QUALITATIVE

## 2024-05-11 NOTE — Telephone Encounter (Signed)
 Left patient a detailed message, ok per DPR, with blood pressure readings. Advised to call back or send a MyChart message with any questions or concerns.

## 2024-05-11 NOTE — Telephone Encounter (Signed)
 Wyn Jackee VEAR Mickey., NP to Me (Selected Message)     05/10/24  2:36 PM Blood pressures appear to be improving with reduction in medication. Please advise to continue current medication and keep follow up with primary cardiologist next month for further recommendations.    Thanks,

## 2024-05-12 ENCOUNTER — Encounter (HOSPITAL_BASED_OUTPATIENT_CLINIC_OR_DEPARTMENT_OTHER): Payer: Self-pay

## 2024-05-13 ENCOUNTER — Ambulatory Visit (HOSPITAL_COMMUNITY)

## 2024-05-14 LAB — OVA AND PARASITE EXAMINATION

## 2024-05-14 LAB — SPECIMEN STATUS REPORT

## 2024-05-15 LAB — PANCREATIC ELASTASE, FECAL: Pancreatic Elastase-1, Stool: >800 ug/g (ref >200)

## 2024-05-15 NOTE — Progress Notes (Signed)
Agree with the assessment and plan as outlined by Amanda Collier,  PA-C.  Scott E. Cunningham, MD  

## 2024-05-21 ENCOUNTER — Other Ambulatory Visit: Payer: Self-pay | Admitting: Student

## 2024-05-23 NOTE — Telephone Encounter (Signed)
 Medication sent to pharmacy

## 2024-05-25 ENCOUNTER — Ambulatory Visit (HOSPITAL_COMMUNITY)
Admission: RE | Admit: 2024-05-25 | Discharge: 2024-05-25 | Disposition: A | Discharge status: Home / Self Care | Source: Ambulatory Visit | Attending: Physician Assistant | Admitting: Physician Assistant

## 2024-05-25 DIAGNOSIS — G8929 Other chronic pain: Secondary | ICD-10-CM | POA: Insufficient documentation

## 2024-05-25 DIAGNOSIS — K7689 Other specified diseases of liver: Secondary | ICD-10-CM | POA: Diagnosis not present

## 2024-05-25 DIAGNOSIS — R1084 Generalized abdominal pain: Secondary | ICD-10-CM | POA: Diagnosis not present

## 2024-05-25 DIAGNOSIS — R1013 Epigastric pain: Secondary | ICD-10-CM | POA: Diagnosis not present

## 2024-05-25 DIAGNOSIS — K580 Irritable bowel syndrome with diarrhea: Secondary | ICD-10-CM | POA: Diagnosis not present

## 2024-05-25 DIAGNOSIS — R197 Diarrhea, unspecified: Secondary | ICD-10-CM | POA: Insufficient documentation

## 2024-05-25 DIAGNOSIS — R109 Unspecified abdominal pain: Secondary | ICD-10-CM | POA: Diagnosis not present

## 2024-05-26 ENCOUNTER — Ambulatory Visit (HOSPITAL_COMMUNITY): Admission: RE | Admit: 2024-05-26 | Source: Ambulatory Visit

## 2024-05-27 ENCOUNTER — Ambulatory Visit (HOSPITAL_COMMUNITY)
Admission: RE | Admit: 2024-05-27 | Discharge: 2024-05-27 | Disposition: A | Discharge status: Home / Self Care | Source: Ambulatory Visit | Attending: Physician Assistant | Admitting: Physician Assistant

## 2024-05-27 DIAGNOSIS — K580 Irritable bowel syndrome with diarrhea: Secondary | ICD-10-CM | POA: Diagnosis not present

## 2024-05-27 DIAGNOSIS — R197 Diarrhea, unspecified: Secondary | ICD-10-CM | POA: Diagnosis not present

## 2024-05-27 DIAGNOSIS — R1013 Epigastric pain: Secondary | ICD-10-CM | POA: Diagnosis not present

## 2024-05-27 DIAGNOSIS — R1310 Dysphagia, unspecified: Secondary | ICD-10-CM | POA: Diagnosis not present

## 2024-05-27 DIAGNOSIS — G8929 Other chronic pain: Secondary | ICD-10-CM | POA: Diagnosis not present

## 2024-05-27 DIAGNOSIS — K449 Diaphragmatic hernia without obstruction or gangrene: Secondary | ICD-10-CM | POA: Diagnosis not present

## 2024-05-27 DIAGNOSIS — R1084 Generalized abdominal pain: Secondary | ICD-10-CM | POA: Insufficient documentation

## 2024-06-10 ENCOUNTER — Encounter: Payer: Self-pay | Admitting: *Deleted

## 2024-06-13 ENCOUNTER — Ambulatory Visit: Attending: Internal Medicine | Admitting: Internal Medicine

## 2024-06-13 ENCOUNTER — Telehealth (HOSPITAL_COMMUNITY): Payer: Self-pay | Admitting: *Deleted

## 2024-06-13 VITALS — BP 112/64 | Ht 66.0 in | Wt 222.7 lb

## 2024-06-13 DIAGNOSIS — I255 Ischemic cardiomyopathy: Secondary | ICD-10-CM

## 2024-06-13 DIAGNOSIS — I10 Essential (primary) hypertension: Secondary | ICD-10-CM | POA: Diagnosis not present

## 2024-06-13 DIAGNOSIS — I2510 Atherosclerotic heart disease of native coronary artery without angina pectoris: Secondary | ICD-10-CM

## 2024-06-13 DIAGNOSIS — I5022 Chronic systolic (congestive) heart failure: Secondary | ICD-10-CM

## 2024-06-13 DIAGNOSIS — E782 Mixed hyperlipidemia: Secondary | ICD-10-CM

## 2024-06-13 NOTE — Telephone Encounter (Signed)
 Reaching out to patient to offer assistance regarding upcoming cardiac imaging study; pt's husband verbalizes understanding of appt date/time, parking situation and where to check in, pre-test NPO status, and verified current allergies; name and call back number provided for further questions should they arise  Chantal Requena RN Navigator Cardiac Imaging [facility] [phone] office [phone] cell  Patient's husband aware that patient is to avoid caffeine  12 hours prior to her cardiac PET study.

## 2024-06-13 NOTE — Progress Notes (Signed)
 Cardiology Office Note:  .   Date:  06/13/2024  ID:  Amanda Frederick, DOB 09/30/1970, MRN [id number] PCP: Jolaine Pac, DO  [facility] Providers Cardiologist:  Soyla DELENA Merck, MD    History of Present Illness: .   Amanda Frederick is a 53 y.o. female.  Discussed the use of AI scribe software for clinical note transcription with the patient, who gave verbal consent to proceed.  History of Present Illness Amanda Frederick is a 53 year old female with coronary artery disease and chronic systolic heart failure who presents with chest pain and weight gain. She is accompanied by her husband, Amanda Frederick.  She has ongoing squeezing chest pain and dyspnea that are unchanged since her last visit in May. She has coronary artery disease with a stent to the left circumflex artery in July 2022 and a prior myocardial infarction. Her most recent echocardiogram in May showed a mildly reduced ejection fraction of 45-50%.  She has gained weight from 145 to 170 pounds with fluctuations of about five pounds over a few days. She notes abdominal heaviness and attributes some of the weight changes to fluid retention. She uses Lasix  40 mg as needed.  Her cardiac medications include atorvastatin  80 mg daily, clopidogrel  75 mg daily, metoprolol  succinate 50 mg twice daily, sacubitril /valsartan  49/51 mg twice daily, spironolactone  12.5 mg daily, and magnesium  supplements.  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    ROS: negative except per HPI above.  Studies Reviewed: SABRA   EKG Interpretation Date/Time:  Monday June 13 2024 08:41:03 EST Ventricular Rate:  79 PR Interval:  140 QRS Duration:  76 QT Interval:  390 QTC  Calculation: 447 R Axis:   73  Text Interpretation: Normal sinus rhythm Low voltage QRS Septal infarct (cited on or before 14-Feb-2021) When compared with ECG of 10-Jun-2023 08:48, No significant change was found Confirmed by Merck Soyla ([id number]) on 06/13/2024 9:11:02 AM    Results LABS Thyroid  function tests: Normal (2024)  RADIOLOGY Cardiac MRI: Ejection fraction 43%, ischemic cardiomyopathy due to coronary artery disease (2023)  DIAGNOSTIC Echocardiogram: Ejection fraction 45-50% (11/2023) Cardiac catheterization: Patent stent in circumflex artery, progression of LAD disease, no revascularization targets EKG: Normal, history of myocardial infarction (06/13/2024) Risk Assessment/Calculations:       Physical Exam:   VS:  BP 112/64 (BP Location: Left Arm, Patient Position: Sitting, Cuff Size: Large)   Ht 5' 6 (1.676 m)   Wt 222 lb 11.2 oz (101 kg)   SpO2 96%   BMI 35.94 kg/m    Wt Readings from Last 3 Encounters:  06/14/24 220 lb (99.8 kg)  06/13/24 222 lb 11.2 oz (101 kg)  05/06/24 218 lb (98.9 kg)     Physical Exam GENERAL: Alert, cooperative, well developed, no acute distress. HEENT: Normocephalic, normal oropharynx, moist mucous membranes. CHEST: Clear to auscultation bilaterally, no wheezes, rhonchi, or crackles. CARDIOVASCULAR: Normal heart rate and rhythm, S1 and S2 normal without murmurs. ABDOMEN: Soft, non-tender, non-distended, without organomegaly, normal bowel sounds. EXTREMITIES: No cyanosis or edema. NEUROLOGICAL: Cranial nerves grossly intact, moves all extremities without gross motor or sensory deficit.   ASSESSMENT AND PLAN: .    Assessment and Plan Assessment & Plan Ischemic cardiomyopathy with chronic systolic heart failure (EF 45-50%) Chronic systolic heart  failure with mildly reduced ejection fraction. Symptoms stable since May. Dizziness possibly medication-related. PET stress test pending for chest pain risk stratification. - Continue  Entresto  49-51 mg BID. - Continue metoprolol  50 mg daily. - Continue spironolactone  12.5 mg daily. - Continue Lasix  40 mg as needed for fluid overload symptoms. - Encouraged regular exercise, including walking and light strength training if stress test is low risk. - Coordinated PET stress test scheduling.  Coronary artery disease with prior myocardial infarction Coronary artery disease with prior PCI. LAD disease progression noted, no revascularization targets. Symptoms unchanged. PET stress test planned for ischemia assessment and risk stratification. - Coordinated PET stress test scheduling. - Continue clopidogrel  75 mg daily. - Consider ranolazine if pet stress test is not high risk for control of chest pain.  Hyperlipidemia Reasonably well-controlled with atorvastatin . Last cholesterol labs satisfactory. - Continue atorvastatin  80 mg daily. - Recheck cholesterol levels at next appropriate visit.  Hypertension Managed with current regimen. Blood pressure borderline, dizziness possibly medication-related. - Continue current antihypertensive regimen. - Monitor blood pressure regularly, especially with dizziness.  Recording duration: 31 minutes  I spent 40 minutes in the care of Amanda Frederick today including reviewing labs (lipids 11/24), reviewing studies (echo, cath), face to face time discussing treatment options (31 min), and documenting in the encounter.     Soyla Merck, MD, FACC

## 2024-06-13 NOTE — Patient Instructions (Signed)
 Medication Instructions:  No Changes *If you need a refill on your cardiac medications before your next appointment, please call your pharmacy*  Lab Work: None  Follow-Up: At [facility], you and your health needs are our priority.  As part of our continuing mission to provide you with exceptional heart care, our providers are all part of one team.  This team includes your primary Cardiologist (physician) and Advanced Practice Providers or APPs (Physician Assistants and Nurse Practitioners) who all work together to provide you with the care you need, when you need it.  Your next appointment:   3 month(s)  Provider:   Callie Goodrich, PA or Hao Meng, [place]

## 2024-06-14 ENCOUNTER — Ambulatory Visit (INDEPENDENT_AMBULATORY_CARE_PROVIDER_SITE_OTHER): Attending: Nurse Practitioner | Admitting: Cardiology

## 2024-06-14 ENCOUNTER — Other Ambulatory Visit (HOSPITAL_COMMUNITY): Payer: Self-pay | Admitting: Nurse Practitioner

## 2024-06-14 ENCOUNTER — Ambulatory Visit (HOSPITAL_COMMUNITY)
Admission: RE | Admit: 2024-06-14 | Discharge: 2024-06-14 | Disposition: A | Discharge status: Home / Self Care | Source: Ambulatory Visit | Attending: Nurse Practitioner | Admitting: Nurse Practitioner

## 2024-06-14 DIAGNOSIS — I255 Ischemic cardiomyopathy: Secondary | ICD-10-CM | POA: Diagnosis not present

## 2024-06-14 DIAGNOSIS — I700 Atherosclerosis of aorta: Secondary | ICD-10-CM | POA: Diagnosis not present

## 2024-06-14 DIAGNOSIS — I5022 Chronic systolic (congestive) heart failure: Secondary | ICD-10-CM | POA: Diagnosis not present

## 2024-06-14 DIAGNOSIS — I10 Essential (primary) hypertension: Secondary | ICD-10-CM | POA: Insufficient documentation

## 2024-06-14 DIAGNOSIS — E109 Type 1 diabetes mellitus without complications: Secondary | ICD-10-CM | POA: Diagnosis not present

## 2024-06-14 DIAGNOSIS — R400 Somnolence: Secondary | ICD-10-CM | POA: Diagnosis not present

## 2024-06-14 DIAGNOSIS — E782 Mixed hyperlipidemia: Secondary | ICD-10-CM | POA: Insufficient documentation

## 2024-06-14 DIAGNOSIS — R911 Solitary pulmonary nodule: Secondary | ICD-10-CM | POA: Diagnosis not present

## 2024-06-14 DIAGNOSIS — I110 Hypertensive heart disease with heart failure: Secondary | ICD-10-CM | POA: Insufficient documentation

## 2024-06-14 DIAGNOSIS — R918 Other nonspecific abnormal finding of lung field: Secondary | ICD-10-CM | POA: Diagnosis not present

## 2024-06-14 LAB — NM PET CT CARDIAC PERFUSION MULTI W/ABSOLUTE BLOODFLOW
LV dias vol: 92 mL (ref 46–106)
MBFR: 1.71
Nuc Rest EF: 47 %
Nuc Stress EF: 49 %
Peak HR: 113 {beats}/min
Rest HR: 90 {beats}/min
Rest MBF: 0.87 ml/g/min
Rest Nuclear Isotope Dose: 25.7 mCi
ST Depression (mm): 0 mm
Stress MBF: 1.49 ml/g/min
Stress Nuclear Isotope Dose: 25.7 mCi
TID: 1

## 2024-06-14 MED ORDER — RUBIDIUM RB82 GENERATOR (RUBYFILL)
25.6700 | PACK | Freq: Once | INTRAVENOUS | Status: AC
  Administered 2024-06-14: 25.67 via INTRAVENOUS

## 2024-06-14 MED ORDER — REGADENOSON 0.4 MG/5ML IV SOLN
0.4000 mg | Freq: Once | INTRAVENOUS | Status: AC
  Administered 2024-06-14: 0.4 mg via INTRAVENOUS

## 2024-06-14 MED ORDER — REGADENOSON 0.4 MG/5ML IV SOLN
INTRAVENOUS | Status: AC
  Filled 2024-06-14: qty 5

## 2024-06-14 NOTE — Progress Notes (Signed)
 Pt tolerated lexiscan. Endorsed chest tightness and SOB during scan, resolved by end. Minor HA at end, given PO caffeine . Able to ambulate independently.

## 2024-06-15 DIAGNOSIS — E109 Type 1 diabetes mellitus without complications: Secondary | ICD-10-CM | POA: Diagnosis not present

## 2024-06-15 DIAGNOSIS — E1049 Type 1 diabetes mellitus with other diabetic neurological complication: Secondary | ICD-10-CM | POA: Diagnosis not present

## 2024-06-15 DIAGNOSIS — Z794 Long term (current) use of insulin: Secondary | ICD-10-CM | POA: Diagnosis not present

## 2024-06-15 DIAGNOSIS — E1065 Type 1 diabetes mellitus with hyperglycemia: Secondary | ICD-10-CM | POA: Diagnosis not present

## 2024-06-15 DIAGNOSIS — E108 Type 1 diabetes mellitus with unspecified complications: Secondary | ICD-10-CM | POA: Diagnosis not present

## 2024-06-16 ENCOUNTER — Ambulatory Visit (HOSPITAL_COMMUNITY): Payer: Self-pay | Admitting: Nurse Practitioner

## 2024-06-17 MED ORDER — NITROGLYCERIN 0.4 MG SL SUBL: 0.4000 mg | SUBLINGUAL_TABLET | SUBLINGUAL | 3 refills | Status: AC | PRN

## 2024-06-17 NOTE — Telephone Encounter (Signed)
 Left the patient a message to call back.

## 2024-06-17 NOTE — Addendum Note (Signed)
 Addended by: SEBASTIAN JANESE GRADE on: 06/17/2024 03:04 PM   Modules accepted: Orders

## 2024-07-01 ENCOUNTER — Ambulatory Visit: Admitting: Gastroenterology

## 2024-07-01 ENCOUNTER — Encounter: Payer: Self-pay | Admitting: Gastroenterology

## 2024-07-01 VITALS — BP 122/80 | HR 100 | Ht 66.0 in | Wt 221.0 lb

## 2024-07-01 DIAGNOSIS — R1310 Dysphagia, unspecified: Secondary | ICD-10-CM

## 2024-07-01 DIAGNOSIS — K2289 Other specified disease of esophagus: Secondary | ICD-10-CM | POA: Diagnosis not present

## 2024-07-01 DIAGNOSIS — K580 Irritable bowel syndrome with diarrhea: Secondary | ICD-10-CM | POA: Diagnosis not present

## 2024-07-01 DIAGNOSIS — R1319 Other dysphagia: Secondary | ICD-10-CM

## 2024-07-01 DIAGNOSIS — Z1211 Encounter for screening for malignant neoplasm of colon: Secondary | ICD-10-CM

## 2024-07-01 MED ORDER — AMITRIPTYLINE HCL 10 MG PO TABS: 20.0000 mg | ORAL_TABLET | Freq: Every day | ORAL | 1 refills | Status: AC

## 2024-07-01 MED ORDER — RIFAXIMIN 550 MG PO TABS: 550.0000 mg | ORAL_TABLET | Freq: Three times a day (TID) | ORAL | 0 refills | Status: AC

## 2024-07-01 NOTE — Patient Instructions (Addendum)
 We have sent the following medications to your pharmacy for you to pick up at your convenience: xifaxan  550 mg three times a day x 14 days.  Increase your Elavil  to 2 tablets (20 mg) at bedtime. Sent a new prescription for this also.  You have been given a low-fodmap diet.   Your provider has ordered Cologuard testing as an option for colon cancer screening. This is performed by [employer] and may be out of network with your insurance. PRIOR to completing the test, it is YOUR responsibility to contact your insurance about covered benefits for this test. Your out of pocket expense could be anywhere from $0.00 to $649.00.   When you call to check coverage with your insurer, please provide the following information:   -The ONLY provider of Cologuard is [facility], Dispensing  - CPT code for Cologuard is [phone].  [employer] NPI # [id number]  -[employer] Tax ID # [id number]   We have already sent your demographic and insurance information to [employer] (phone number [phone]) and they should contact you within the next week regarding your test. If you have not heard from them within the next week, please call our office at [phone].  _______________________________________________________  If your blood pressure at your visit was 140/90 or greater, please contact your primary care physician to follow up on this.  _______________________________________________________  If you are age 86 or older, your body mass index should be between 23-30. Your Body mass index is 35.67 kg/m. If this is out of the aforementioned range listed, please consider follow up with your Primary Care Provider.  If you are age 92 or younger, your body mass index should be between 19-25. Your Body mass index is 35.67 kg/m. If this is out of the aformentioned range listed, please consider follow up with your Primary Care Provider.    ________________________________________________________  The [facility] providers would like to encourage you to use MYCHART to communicate with providers for non-urgent requests or questions.  Due to long hold times on the telephone, sending your provider a message by [facility] may be a faster and more efficient way to get a response.  Please allow 48 business hours for a response.  Please remember that this is for non-urgent requests.  _______________________________________________________  [facility] is using a team-based approach to care.  Your team is made up of your doctor and two to three APPS. Our APPS (Nurse Practitioners and Physician Assistants) work with your physician to ensure care continuity for you. They are fully qualified to address your health concerns and develop a treatment plan. They communicate directly with your gastroenterologist to care for you. Seeing the Advanced Practice Practitioners on your physician's team can help you by facilitating care more promptly, often allowing for earlier appointments, access to diagnostic testing, procedures, and other specialty referrals.

## 2024-07-01 NOTE — Progress Notes (Unsigned)
 HPI :       Past Medical History:  Diagnosis Date   AC (acromioclavicular) joint bone spurs    Anemia    Anxiety    Arthritis    Bronchitis    Bulging lumbar disc    Bursitis    CHF (congestive heart failure) (HCC)    Complication of anesthesia    has died 3 separate times under anesthesia   Depression    Diabetes (HCC)    Environmental allergies    Fibromyalgia    High blood pressure    High cholesterol    History of COVID-19 01/2020   was hospitalized   History of endometriosis    History of uterine fibroid    Macular edema    Migraines    Neuropathy    eyes , hands and feet    Pinched nerve    Pneumonia    Seizure (HCC)    after eye test only   Spondylosis    Tachycardia    after COVID   Tendonitis    Vision abnormalities      Past Surgical History:  Procedure Laterality Date   CORONARY STENT INTERVENTION N/A 02/19/2021   Procedure: CORONARY STENT INTERVENTION;  Surgeon: Dann Candyce RAMAN, MD;  Location: [facility] INVASIVE CV LAB;  Service: Cardiovascular;  Laterality: N/A;   NASAL SINUS SURGERY     ORIF ANKLE FRACTURE Left 01/04/2021   Procedure: OPEN REDUCTION INTERNAL FIXATION (ORIF) LEFT BIMALLEOLAR ANKLE FRACTURE;  Surgeon: Vernetta Lonni GRADE, MD;  Location: [facility];  Service: Orthopedics;  Laterality: Left;   RIGHT/LEFT HEART CATH AND CORONARY ANGIOGRAPHY N/A 02/19/2021   Procedure: RIGHT/LEFT HEART CATH AND CORONARY ANGIOGRAPHY;  Surgeon: Dann Candyce RAMAN, MD;  Location: [facility] INVASIVE CV LAB;  Service: Cardiovascular;  Laterality: N/A;   RIGHT/LEFT HEART CATH AND CORONARY ANGIOGRAPHY N/A 09/20/2021   Procedure: RIGHT/LEFT HEART CATH AND CORONARY ANGIOGRAPHY;  Surgeon: Cherrie Toribio SAUNDERS, MD;  Location: [facility] INVASIVE CV LAB;  Service: Cardiovascular;  Laterality: N/A;   TUBAL LIGATION     VAGINAL HYSTERECTOMY     Family History  Problem Relation Age of Onset   Anemia Mother    High Cholesterol Mother    Diabetes Mother    Arthritis Mother     Diabetes type I Maternal Grandmother    Colon cancer Neg Hx    Esophageal cancer Neg Hx    Stomach cancer Neg Hx    Colon polyps Neg Hx    Breast cancer Neg Hx    Social History   Tobacco Use   Smoking status: Never   Smokeless tobacco: Never  Substance Use Topics   Alcohol use: Not Currently    Comment: special occassions   Drug use: No   Current Outpatient Medications  Medication Sig Dispense Refill   Accu-Chek Softclix Lancets lancets 3 (three) times daily.     amitriptyline  (ELAVIL ) 10 MG tablet TAKE 1 TABLET BY MOUTH EVERYDAY AT BEDTIME 90 tablet 5   atorvastatin  (LIPITOR ) 80 MG tablet TAKE 1 TABLET BY MOUTH EVERY DAY 90 tablet 3   cholecalciferol  (VITAMIN D ) 1000 UNITS tablet Take 1,000 Units by mouth daily. Pt takes 10,000 daily     cholestyramine  (QUESTRAN ) 4 g packet Take 1 packet (4 g total) by mouth 2 (two) times daily. 60 each 4   clopidogrel  (PLAVIX ) 75 MG tablet TAKE 1 TABLET BY MOUTH EVERY DAY 30 tablet 11   Continuous Blood Gluc Receiver (DEXCOM G7 RECEIVER) DEVI 1 each by Does  not apply route as needed. 1 each 0   Continuous Glucose Sensor (DEXCOM G7 SENSOR) MISC Use to monitor blood sugar as directed 9 each 3   dicyclomine  (BENTYL ) 20 MG tablet Take 1 tablet (20 mg total) by mouth every 6 (six) hours as needed for spasms. Please schedule a yearly follow up for further refills. Thank you 30 tablet 0   DULoxetine  (CYMBALTA ) 30 MG capsule TAKE 1 CAPSULE BY MOUTH EVERY DAY 30 capsule 5   fluticasone  (FLONASE ) 50 MCG/ACT nasal spray SPRAY 1 SPRAY INTO EACH NOSTRIL DAILY 16 mL 8   furosemide  (LASIX ) 40 MG tablet Take 1 tablet (40 mg total) by mouth daily as needed. 60 tablet 8   glucose blood (BAYER CONTOUR NEXT TEST) test strip Test 8-10 times per day 8-10 lancets/day 300 each 11   Insulin  Infusion Pump (T:SLIM X2 CONTROL-IQ PUMP) DEVI 1 each by Does not apply route as needed. 1 each 0   Insulin  Infusion Pump Supplies (PARADIGM QUICK-SET 32 ) MISC Use with novolog  in  insulin  pump 40 each 3   Insulin  Infusion Pump Supplies (T:SLIM X2 CARTRIDGE) MISC Use with insulin  pump 30 each 4   Insulin  Infusion Pump Supplies (T:SLIM X2/CONTROL-IQ/ACC/INSTR) MISC Use with insulin  pump 1 each 1   ipratropium-albuterol  (DUONEB) 0.5-2.5 (3) MG/3ML SOLN Take 3 mLs by nebulization every 4 (four) hours as needed (wheezing). 360 mL 0   loperamide  (IMODIUM ) 2 MG capsule Take 1 capsule (2 mg total) by mouth as needed for diarrhea or loose stools. Take 2 mg after every loose bowel movement with a maximum of 16 mg a day (maximum of 8 pills a day) 90 capsule 2   Magnesium  250 MG TABS Take 250 mg by mouth daily.     metoprolol  succinate (TOPROL -XL) 50 MG 24 hr tablet TAKE 1 TABLET BY MOUTH TWICE A DAY 180 tablet 3   Multiple Vitamin (MULTIVITAMIN) capsule Take 1 capsule by mouth daily.     nitroGLYCERIN  (NITROSTAT ) 0.4 MG SL tablet Place 1 tablet (0.4 mg total) under the tongue every 5 (five) minutes as needed. 25 tablet 3   NOVOLOG  100 UNIT/ML injection TAKE UP TO 200 UNITS PER DAY VIA THE PUMP AS INSTRUCTED 60 mL 11   pantoprazole  (PROTONIX ) 40 MG tablet Take 1 tablet (40 mg total) by mouth daily. 30 tablet 3   potassium chloride  SA (KLOR-CON  M20) 20 MEQ tablet TAKE 1 TABLET BY MOUTH EVERY DAY 90 tablet 3   sacubitril -valsartan  (ENTRESTO ) 49-51 MG Take 1 tablet by mouth 2 (two) times daily. 60 tablet 3   spironolactone  (ALDACTONE ) 25 MG tablet TAKE 0.5 TABLET (12.5 MG TOTAL) BY MOUTH DAILY. NEEDS FOLLOW UP APPOINTMENT FOR ANYMORE REFILLS 15 tablet 5   vitamin B-12 (CYANOCOBALAMIN ) 500 MCG tablet Take 500 mcg by mouth daily.     No current facility-administered medications for this visit.   Facility-Administered Medications Ordered in Other Visits  Medication Dose Route Frequency Provider Last Rate Last Admin   sodium chloride  flush (NS) 0.9 % injection 3 mL  3 mL Intravenous Q12H Bensimhon, Toribio SAUNDERS, MD       Allergies  Allergen Reactions   Fentanyl  Nausea And Vomiting    Other Hives    Antibiotic specific one unsure but has tolerated antibiotic recently. Reaction was about 20 years ago.   Wound Dressing Adhesive Other (See Comments)    Blisters  Other Reaction(s): blisters     Review of Systems: All systems reviewed and negative except where noted in  HPI.    NM PET CT CARDIAC PERFUSION RAD READ ONLY Result Date: 06/15/2024 EXAM: NUCLEAR PET 06/14/2024 11:07:46 AM TECHNIQUE: CT data overread of noncardiac structures in this case was performed. Interpretation of cardiac and PET data to be provided under separate report by cardiologist. COMPARISON: Chest radiograph 08/31/2023. CLINICAL HISTORY: FINDINGS: Extracardiac vasculature: aortic atherosclerosis Mediastinum: unremarkable Upper abdomen: unremarkable Lung: bandlike opacity favoring scarring or atelectasis in the right middle lobe and right lower lobe, and to a lesser extent in the left lower lobe. Mild scarring laterally in the right upper lobe. Ground glass density pleural based nodule in the right lower lobe 6 x 5 mm on image 36 series 4. According to the Fleischner Society pulmonary nodule recommendations, the finding is consistent with a single ground glass nodule 6 mm and the recommendation is CT at 6-12 months, then if persistent, CT every 2 years until 5 years. Musculoskeletal: thoracic spondylosis. IMPRESSION: 1. Ground-glass pleural-based nodule in the right lower lobe measuring 6 x 5 mm; recommend CT at 612 months, and if persistent, CT every 2 years until 5 years, per Fleischner Society guidelines. 2. Bandlike opacities favoring scarring or atelectasis in the right middle and right lower lobes, with lesser involvement of the left lower lobe, and mild scarring in the lateral right upper lobe. 3. Thoracic spondylosis. Electronically signed by: Ryan Salvage MD 06/15/2024 01:30 PM EST RP Workstation: [id number]   NM PET CT CARDIAC PERFUSION MULTI W/ABSOLUTE BLOODFLOW Result Date: 06/14/2024    Medium, severe, fixed defect in the apical septum, apical anterior, and apex segments. Consistent with LAD infarction. No ischemia. MBFR is abnormal, 1.71. LVEF mildly reduced LVEF 47% with increase to 49% at stress. No TID. Suspect MBFR is abnormal due to microvascular disease, but given fixed defect and history this is possibly related to multi-vessel CAD.   LV perfusion is abnormal. There is no evidence of ischemia. There is evidence of infarction. Defect 1: There is a medium defect with severe reduction in uptake present in the apical anterior, anteroseptal and apex location(s) that is fixed. There is abnormal wall motion in the defect area. Consistent with infarction.   Rest left ventricular function is abnormal. Rest global function is mildly reduced. There was a single regional abnormality. Rest EF: 47%. Stress left ventricular function is abnormal. There was a single regional abnormality. Stress EF: 49%. End diastolic cavity size is normal.   Myocardial blood flow was computed to be 0.73ml/g/min at rest and 1.79ml/g/min at stress. Global myocardial blood flow reserve was 1.71 and was abnormal.   Coronary calcium  assessment not performed due to prior revascularization.   Findings are consistent with infarction. The study is intermediate risk.   Electronically signed by Darryle Decent, MD    Physical Exam: BP 122/80   Pulse 100   Ht 5' 6 (1.676 m)   Wt 221 lb (100.2 kg)   BMI 35.67 kg/m  Constitutional: Pleasant,well-developed, ***female in no acute distress. HEENT: Normocephalic and atraumatic. Conjunctivae are normal. No scleral icterus. Neck supple.  Cardiovascular: Normal rate, regular rhythm.  Pulmonary/chest: Effort normal and breath sounds normal. No wheezing, rales or rhonchi. Abdominal: Soft, nondistended, nontender. Bowel sounds active throughout. There are no masses palpable. No hepatomegaly. Extremities: no edema Lymphadenopathy: No cervical adenopathy noted. Neurological: Alert  and oriented to person place and time. Skin: Skin is warm and dry. No rashes noted. Psychiatric: Normal mood and affect. Behavior is normal.  CBC    Component Value Date/Time   WBC 7.0 05/06/2024 1116  RBC 5.14 (H) 05/06/2024 1116   HGB 14.4 05/06/2024 1116   HGB 15.0 09/13/2021 1409   HCT 43.2 05/06/2024 1116   HCT 45.2 09/13/2021 1409   PLT 277.0 05/06/2024 1116   PLT 271 09/13/2021 1409   MCV 84.2 05/06/2024 1116   MCV 85 09/13/2021 1409   MCH 27.8 11/05/2021 1446   MCHC 33.3 05/06/2024 1116   RDW 14.0 05/06/2024 1116   RDW 12.5 09/13/2021 1409   LYMPHSABS 1.7 05/06/2024 1116   MONOABS 0.5 05/06/2024 1116   EOSABS 0.1 05/06/2024 1116   BASOSABS 0.0 05/06/2024 1116    CMP     Component Value Date/Time   NA 139 05/06/2024 1116   NA 140 04/15/2024 0933   K 4.1 05/06/2024 1116   CL 102 05/06/2024 1116   CO2 26 05/06/2024 1116   GLUCOSE 234 (H) 05/06/2024 1116   BUN 19 05/06/2024 1116   BUN 19 04/15/2024 0933   CREATININE 0.91 05/06/2024 1116   CALCIUM  9.6 05/06/2024 1116   PROT 7.4 05/06/2024 1116   ALBUMIN 4.5 05/06/2024 1116   AST 13 05/06/2024 1116   ALT 14 05/06/2024 1116   ALKPHOS 107 05/06/2024 1116   BILITOT 0.4 05/06/2024 1116   GFRNONAA >60 11/16/2023 1029   GFRAA >60 03/07/2020 0130       Latest Ref Rng & Units 05/06/2024   11:16 AM 11/05/2021    2:46 PM 09/20/2021    9:32 AM  CBC EXTENDED  WBC 4.0 - 10.5 K/uL 7.0  8.5    RBC 3.87 - 5.11 Mil/uL 5.14  5.14    Hemoglobin 12.0 - 15.0 g/dL 85.5  85.6  88.3   HCT 36.0 - 46.0 % 43.2  42.9  34.0   Platelets 150.0 - 400.0 K/uL 277.0  266    NEUT# 1.4 - 7.7 K/uL 4.6     Lymph# 0.7 - 4.0 K/uL 1.7         ASSESSMENT AND PLAN:  Jolaine Pac, DO

## 2024-07-02 ENCOUNTER — Other Ambulatory Visit (HOSPITAL_COMMUNITY): Payer: Self-pay | Admitting: Internal Medicine

## 2024-07-04 ENCOUNTER — Telehealth: Payer: Self-pay

## 2024-07-04 ENCOUNTER — Other Ambulatory Visit (HOSPITAL_COMMUNITY): Payer: Self-pay

## 2024-07-04 NOTE — Telephone Encounter (Signed)
 Pharmacy Patient Advocate Encounter   Received notification from CoverMyMeds that prior authorization for Xifaxan  550MG  tablets is required/requested.   Insurance verification completed.   The patient is insured through [facility].   Prior Authorization for Xifaxan  550MG  tablets has been APPROVED from 07-04-2024 to 07-18-2024. Ran test claim, Copay is $0.00. This test claim was processed through [facility]- copay amounts may vary at other pharmacies due to pharmacy/plan contracts, or as the patient moves through the different stages of their insurance plan.   PA #/Case ID/Reference #: [id number]

## 2024-07-09 ENCOUNTER — Other Ambulatory Visit (HOSPITAL_COMMUNITY): Payer: Self-pay | Admitting: Internal Medicine

## 2024-07-11 ENCOUNTER — Telehealth (HOSPITAL_COMMUNITY): Payer: Self-pay

## 2024-07-14 DIAGNOSIS — Z1211 Encounter for screening for malignant neoplasm of colon: Secondary | ICD-10-CM | POA: Diagnosis not present

## 2024-07-21 LAB — COLOGUARD: COLOGUARD: NEGATIVE

## 2024-07-22 ENCOUNTER — Ambulatory Visit: Payer: Self-pay | Admitting: Gastroenterology

## 2024-07-22 NOTE — Progress Notes (Signed)
 Amanda Frederick,  Your Cologuard was negative.  I recommend you repeat a Cologuard again in 3 years.  Alan,  Please place reminder for Cologuard in 3 years.

## 2024-07-24 NOTE — Procedures (Addendum)
" °  Indications for Polysomnography The patient is a 53 year old Female who is 5' 6 and weighs 220.0 lbs. Her BMI equals 35.8.  A full night polysomnogram was performed to evaluate for Obstructive Sleep Apnea .  Medications self administered.DICYCLOMINEAMITRIPTYLINEENTRESTODULOXETINEFLUTICASONENOVOLOG PUMPATORVASTATINAMITRIPTYLINEMETOPROLOL Polysomnogram Data A full night polysomnogram recorded the standard physiologic parameters including EEG, EOG, EMG, EKG, nasal and oral airflow.  Respiratory parameters of chest and abdominal movements were recorded with Respiratory Inductance Plethysmography belts.   Oxygen saturation was recorded by pulse oximetry.  Sleep Architecture The total recording time of the polysomnogram was 417.1 minutes.  The total sleep time was 347.5 minutes.  The patient spent 9.9% of total sleep time in Stage N1, 63.2% in Stage N2, 17.8% in Stages N3, and 9.1% in REM.  Sleep latency was 8.2 minutes.   REM latency was 170.5 minutes.  Sleep Efficiency was 83.3%.  Wake after Sleep Onset time was 61.0 minutes.  Respiratory Events The polysomnogram revealed a presence of 0 obstructive, 2 centrals, and 0 mixed apneas resulting in an Apnea index of 0.3 events per hour.  There were 45 hypopneas (GreaterEqual to3% desaturation and/or arousal) resulting in an Apnea\Hypopnea Index (AHI  GreaterEqual to3% desaturation and/or arousal) of 8.1 events per hour.  There were 13 hypopneas (GreaterEqual to4% desaturation) resulting in an Apnea\Hypopnea Index (AHI GreaterEqual to4% desaturation) of 2.6 events per hour.  There were 15 Respiratory  Effort Related Arousals resulting in a RERA index of 2.6 events per hour. The Respiratory Disturbance Index is 10.7 events per hour.  The snore index was 0 events per hour.  Mean oxygen saturation was 92.8%.  The lowest oxygen saturation during sleep was 84.0%.  Time spent LessEqual to88% oxygen saturation was  minutes ().  Limb Activity There were 72  total limb movements recorded, of this total, 72 were classified as PLMs.  PLM index was 12.4 per hour and PLM associated with Arousals index was 0.9 per hour.  Cardiac Summary The average pulse rate was 83.7 bpm.  The minimum pulse rate was 70.0 bpm while the maximum pulse rate was 102.0 bpm.  Cardiac rhythm was NSR with rare PVCs.   Diagnosis: Normal Sleep Study   Recommendations: 1. Normal study with no significant sleep disordered breathing. 2. Healthy sleep recommendations include: adequate nightly sleep (normal 7-9 hrs/night), avoidance of caffeine  after noon and alcohol near bedtime, and maintaining a sleep environment that is cool, dark and quiet. 3. Weight loss for overweight patients is recommended. 4. Snoring recommendations include: weight loss where appropriate, side sleeping, and avoidance of alcohol before bed. 5. Operation of motor vehicle or dangerous equipment must be avoided when feeling drowsy, excessively sleepy, or mentally fatigued. 6. An ENT consultation which may be useful for specific causes of and possible treatment of bothersome snoring . 7. Weight loss may be of benefit in reducing the severity of snoring   This study was personally reviewed and electronically signed by: Dr. Wilbert Bihari Accredited Board Certified in Sleep Medicine Date/Time: 07/24/2024 8:49PM "

## 2024-07-25 ENCOUNTER — Telehealth: Payer: Self-pay | Admitting: *Deleted

## 2024-07-25 NOTE — Telephone Encounter (Signed)
 Patient notified of result via her mychart.

## 2024-07-25 NOTE — Telephone Encounter (Signed)
-----   Message from Wilbert Bihari, MD sent at 07/24/2024  8:50 PM EST ----- Please let patient know that sleep study showed no significant sleep apnea.

## 2024-08-02 ENCOUNTER — Telehealth (HOSPITAL_COMMUNITY): Payer: Self-pay

## 2024-08-02 ENCOUNTER — Other Ambulatory Visit (HOSPITAL_COMMUNITY): Payer: Self-pay | Admitting: Internal Medicine

## 2024-08-02 NOTE — Progress Notes (Incomplete)
 "   Advanced Heart Failure Clinic Note    PCP: Jolaine Pac, DO Primary Cardiologist: Soyla DELENA Merck, MD  HF Cardiologist: Dr. Cherrie  HPI: Amanda Frederick is a 54 y.o. female with history of DM1, HTN, HLD, hx fibromyalgia, neuropathy, hx COVID-19 infection 02/2020.   Reports gradually progressing dyspnea with exertion for a couple of years. Felt horrible after COVID and dyspnea never really improved. Underwent ORIF for ankle fracture in June 2022. Initially did okay post-op. Subsequently developed LE edema, dyspnea and orthopnea. She was later admitted July 2022 with acute systolic CHF.   Echo 7/22: EF 25-30%, RV ok, large left pleural effusion, mild MR. Diuresed with IV lasix . Underwent thoracentesis for L pleural effusion.  Echo 11/22: EF 30-35%, septal and peri-apical severe hypokinesis, RV okay, mild MR  Seen for initial HF clinic visit 08/07/21. Noted ongoing dyspnea with exertion and chronic CP. Volume looked okay. ReDS 28%. Started on spiro.   Echo 09/16/21: EF 35-40%, RV okay, mild MR  cMRI 09/16/21: LVEF 43%, RVEF 49%, delayed enhancement showing myocardial infarction-pattern scarring consistent with substantial LAD territory MI.  Cath 09/20/21 showed progression of LAD disease. Now with diffuse diabetic CAD in mid to distal LAD corresponding to infarct seen on cMRI. Reviewed with IC team. No target for intervention particularly given lack of viability on MRI. EF 35-45%   Ao = 133/79 (104) LV = 124/15 RA =  1 RV = 30/6 PA = 29/8 (17) PCW = 10 Fick cardiac output/index = 6.1/3.1 PVR = 0.9 WU Ao sat = 99% PA sat = 81%, 82% High SVC = 79%  Echo 4/24 showed 35-40%, RV ok.  Here for routine f/u. Says she is doing pretty well. Working around the house. Has occasional dizziness and ab swelling. SOB if she does too much. Able to feed and water pigs and chickens.     Echo today 12/03/23 EF 40-45% RV ok    Review of Systems:  Cardiac and Respiratory - Negative except as  mentioned in HPI.  Past Medical History:  Diagnosis Date   AC (acromioclavicular) joint bone spurs    Anemia    Anxiety    Arthritis    Bronchitis    Bulging lumbar disc    Bursitis    CHF (congestive heart failure) (HCC)    Complication of anesthesia    has died 3 separate times under anesthesia   Depression    Diabetes (HCC)    Environmental allergies    Fibromyalgia    High blood pressure    High cholesterol    History of COVID-19 01/2020   was hospitalized   History of endometriosis    History of uterine fibroid    Macular edema    Migraines    Neuropathy    eyes , hands and feet    Pinched nerve    Pneumonia    Seizure (HCC)    after eye test only   Spondylosis    Tachycardia    after COVID   Tendonitis    Vision abnormalities    Current Outpatient Medications  Medication Sig Dispense Refill   Accu-Chek Softclix Lancets lancets 3 (three) times daily.     amitriptyline  (ELAVIL ) 10 MG tablet Take 2 tablets (20 mg total) by mouth at bedtime. 180 tablet 1   atorvastatin  (LIPITOR ) 80 MG tablet TAKE 1 TABLET BY MOUTH EVERY DAY 30 tablet 11   cholecalciferol  (VITAMIN D ) 1000 UNITS tablet Take 1,000 Units by mouth daily. Pt takes 10,000  daily     cholestyramine  (QUESTRAN ) 4 g packet Take 1 packet (4 g total) by mouth 2 (two) times daily. 60 each 4   clopidogrel  (PLAVIX ) 75 MG tablet TAKE 1 TABLET BY MOUTH EVERY DAY 30 tablet 11   Continuous Blood Gluc Receiver (DEXCOM G7 RECEIVER) DEVI 1 each by Does not apply route as needed. 1 each 0   Continuous Glucose Sensor (DEXCOM G7 SENSOR) MISC Use to monitor blood sugar as directed 9 each 3   dicyclomine  (BENTYL ) 20 MG tablet Take 1 tablet (20 mg total) by mouth every 6 (six) hours as needed for spasms. Please schedule a yearly follow up for further refills. Thank you 30 tablet 0   DULoxetine  (CYMBALTA ) 30 MG capsule TAKE 1 CAPSULE BY MOUTH EVERY DAY 30 capsule 5   fluticasone  (FLONASE ) 50 MCG/ACT nasal spray SPRAY 1 SPRAY  INTO EACH NOSTRIL DAILY 16 mL 8   furosemide  (LASIX ) 40 MG tablet Take 1 tablet (40 mg total) by mouth daily as needed. 60 tablet 8   glucose blood (BAYER CONTOUR NEXT TEST) test strip Test 8-10 times per day 8-10 lancets/day 300 each 11   Insulin  Infusion Pump (T:SLIM X2 CONTROL-IQ PUMP) DEVI 1 each by Does not apply route as needed. 1 each 0   Insulin  Infusion Pump Supplies (PARADIGM QUICK-SET 32 ) MISC Use with novolog  in insulin  pump 40 each 3   Insulin  Infusion Pump Supplies (T:SLIM X2 CARTRIDGE) MISC Use with insulin  pump 30 each 4   Insulin  Infusion Pump Supplies (T:SLIM X2/CONTROL-IQ/ACC/INSTR) MISC Use with insulin  pump 1 each 1   ipratropium-albuterol  (DUONEB) 0.5-2.5 (3) MG/3ML SOLN Take 3 mLs by nebulization every 4 (four) hours as needed (wheezing). 360 mL 0   loperamide  (IMODIUM ) 2 MG capsule Take 1 capsule (2 mg total) by mouth as needed for diarrhea or loose stools. Take 2 mg after every loose bowel movement with a maximum of 16 mg a day (maximum of 8 pills a day) 90 capsule 2   Magnesium  250 MG TABS Take 250 mg by mouth daily.     metoprolol  succinate (TOPROL -XL) 50 MG 24 hr tablet TAKE 1 TABLET BY MOUTH TWICE A DAY 180 tablet 3   Multiple Vitamin (MULTIVITAMIN) capsule Take 1 capsule by mouth daily.     nitroGLYCERIN  (NITROSTAT ) 0.4 MG SL tablet Place 1 tablet (0.4 mg total) under the tongue every 5 (five) minutes as needed. 25 tablet 3   NOVOLOG  100 UNIT/ML injection TAKE UP TO 200 UNITS PER DAY VIA THE PUMP AS INSTRUCTED 60 mL 11   pantoprazole  (PROTONIX ) 40 MG tablet Take 1 tablet (40 mg total) by mouth daily. 30 tablet 3   potassium chloride  SA (KLOR-CON  M) 20 MEQ tablet TAKE 1 TABLET BY MOUTH EVERY DAY 90 tablet 0   sacubitril -valsartan  (ENTRESTO ) 49-51 MG Take 1 tablet by mouth 2 (two) times daily. 60 tablet 3   spironolactone  (ALDACTONE ) 25 MG tablet TAKE 0.5 TABLET (12.5 MG TOTAL) BY MOUTH DAILY. NEEDS FOLLOW UP APPOINTMENT FOR ANYMORE REFILLS 15 tablet 5   vitamin  B-12 (CYANOCOBALAMIN ) 500 MCG tablet Take 500 mcg by mouth daily.     No current facility-administered medications for this visit.   Facility-Administered Medications Ordered in Other Visits  Medication Dose Route Frequency Provider Last Rate Last Admin   sodium chloride  flush (NS) 0.9 % injection 3 mL  3 mL Intravenous Q12H Bensimhon, Toribio SAUNDERS, MD       Allergies  Allergen Reactions   Fentanyl  Nausea  And Vomiting   Other Hives    Antibiotic specific one unsure but has tolerated antibiotic recently. Reaction was about 20 years ago.   Wound Dressing Adhesive Other (See Comments)    Blisters  Other Reaction(s): blisters   Social History   Socioeconomic History   Marital status: Married    Spouse name: Corinthia   Number of children: 3   Years of education: college   Highest education level: Not on file  Occupational History    Comment: Home maker  Tobacco Use   Smoking status: Never   Smokeless tobacco: Never  Vaping Use   Vaping status: Not on file  Substance and Sexual Activity   Alcohol use: Not Currently    Comment: special occassions   Drug use: No   Sexual activity: Not on file  Other Topics Concern   Not on file  Social History Narrative   Patient lives at home with her husband Corinthia. Worked previously in optometrist office, left work due to personnel issues. Patient has college education. Patient drinks 4-6 caffeine  drinks daily.Right handed.   Social Drivers of Health   Tobacco Use: Low Risk (05/06/2024)   Patient History    Smoking Tobacco Use: Never    Smokeless Tobacco Use: Never    Passive Exposure: Not on file  Financial Resource Strain: Low Risk (02/24/2024)   Overall Financial Resource Strain (CARDIA)    Difficulty of Paying Living Expenses: Not hard at all  Food Insecurity: No Food Insecurity (02/24/2024)   Epic    Worried About Programme Researcher, Broadcasting/film/video in the Last Year: Never true    Ran Out of Food in the Last Year: Never true  Transportation Needs: No  Transportation Needs (02/24/2024)   Epic    Lack of Transportation (Medical): No    Lack of Transportation (Non-Medical): No  Physical Activity: Inactive (02/24/2024)   Exercise Vital Sign    Days of Exercise per Week: 0 days    Minutes of Exercise per Session: 0 min  Stress: Stress Concern Present (02/24/2024)   [employer] of Occupational Health - Occupational Stress Questionnaire    Feeling of Stress: To some extent  Social Connections: Moderately Isolated (02/24/2024)   Social Connection and Isolation Panel    Frequency of Communication with Friends and Family: More than three times a week    Frequency of Social Gatherings with Friends and Family: Three times a week    Attends Religious Services: Never    Active Member of Clubs or Organizations: No    Attends Banker Meetings: Never    Marital Status: Married  Catering Manager Violence: Not At Risk (02/24/2024)   Epic    Fear of Current or Ex-Partner: No    Emotionally Abused: No    Physically Abused: No    Sexually Abused: No  Depression (PHQ2-9): High Risk (02/24/2024)   Depression (PHQ2-9)    PHQ-2 Score: 14  Alcohol Screen: Low Risk (02/24/2024)   Alcohol Screen    Last Alcohol Screening Score (AUDIT): 0  Housing: Low Risk (02/24/2024)   Epic    Unable to Pay for Housing in the Last Year: No    Number of Times Moved in the Last Year: 0    Homeless in the Last Year: No  Utilities: Not At Risk (02/24/2024)   Epic    Threatened with loss of utilities: No  Health Literacy: Inadequate Health Literacy (02/24/2024)   B1300 Health Literacy    Frequency of need for help  with medical instructions: Always   Family History  Problem Relation Age of Onset   Anemia Mother    High Cholesterol Mother    Diabetes Mother    Arthritis Mother    Diabetes type I Maternal Grandmother    Colon cancer Neg Hx    Esophageal cancer Neg Hx    Stomach cancer Neg Hx    Colon polyps Neg Hx    Breast cancer Neg Hx    There  were no vitals taken for this visit.   Wt Readings from Last 3 Encounters:  07/01/24 100.2 kg (221 lb)  06/14/24 99.8 kg (220 lb)  06/13/24 101 kg (222 lb 11.2 oz)   PHYSICAL EXAM: General:  Well appearing. No resp difficulty HEENT: normal Neck: supple. no JVD. Carotids 2+ bilat; no bruits. No lymphadenopathy or thryomegaly appreciated. Cor: PMI nondisplaced. Regular rate & rhythm. No rubs, gallops or murmurs. Lungs: clear Abdomen: obese soft, nontender, nondistended. No hepatosplenomegaly. No bruits or masses. Good bowel sounds. Extremities: no cyanosis, clubbing, rash, edema Neuro: alert & orientedx3, cranial nerves grossly intact. moves all 4 extremities w/o difficulty. Affect pleasant   ReDs: 27%  ASSESSMENT & PLAN: Chronic systolic CHF/ICM: - Echo (7/22): EF 25-30%, RV okay - R/LHC (7/22): RA 6, PA mean 23, PCWP 9, Fick CI 2.54. 90% Lcx treated with PCI/DES, diffuse disease marginal branches, LAD and diagonal - Echo (11/22): EF 30-35% RV okay - Echo (2/23): EF 35-40%, RV okay, mild MR - cMRI (2/23): LVEF 43%, RVEF 49%, delayed enhancement suggestive of substantial LAD territory MI. - Cath 2/23 EF 35-40% showed progression of LAD disease. Now with diffuse diabetic CAD in mid to distal LAD corresponding to infarct seen on cMRI. Reviewed with IC team. Lcx stent patent. No target for intervention particularly given lack of viability on MRI. RHC ok  - Echo 4/24 showed 35-40%, RV ok - Echo today 12/03/23  EF 40-45% RV ok Personally reviewed - NYHA II-III Volume ok - Degree of symptoms seem out of proportion to cardiac findings. Suspect weight and depression playing a roe - Continue Lasix  40 mg daily,  - Increase Entresto  to 97/130 mg BID - Continue Toprol  XL 50 mg bid - Continue spiro 12.5 mg daily. - No SGLT2i with Type I DM - Encouraged more activity - Recent labs reviewed 11/16/23 K 4.0 Cr 0.89   2. CAD: - s/p PCI/DES mid Lcx 7/22.  - cMRI with delayed enhancement suggestive  of LAD territory myocardial infarction. Dr. Cherrie reviewed prior cath images from 07/22 personally. There is 70-80% diffuse disease in mid to distal LAD (? Event at some point with acute occlusion and subsequent recannulation) - Cath 2/23 as above; continue Medical therapy  - Continues with occasional CP - Off ASA. Now on plavix  as single antiplatelet agent. - Continue atorvastatin  80. - Followed by Gen Cards - would benefit from CR, have not been able to coordinate in past due to scheduling conflicts. We discussed this again  3. Palpitations - Zio 1/24 4 runs NSVT. 1 run SVT - Zio 11/24 7 runs NSVT longest 6 beats and 4 runs SVT longest 17 beats - Continue metoprolol  - Sleep consult as below  4. Hyperlipidemia - On high-intensity statin - Managed by primary cardiology team - Last LDL was 59  5. Type I DM: - Has insulin  pump - A1c 8.4 -> 12-->14-->7.0 -> 7.0 - Followed by Endocrine  6. Snoring - Has f/u today with [facility]  7. HTN - BP still  up at time - increase Entresto     Harlene CHRISTELLA Gainer, FNP  8:47 AM  "

## 2024-08-02 NOTE — Telephone Encounter (Signed)
 Called to confirm/remind patient of their appointment at the [facility] on 08/03/24.   Appointment:   [] Confirmed  [x] Left mess   [] No answer/No voice mail  [] VM Full/unable to leave message  [] Phone not in service  And to bring in all medications and/or complete list.

## 2024-08-03 ENCOUNTER — Encounter (HOSPITAL_COMMUNITY): Payer: Self-pay

## 2024-08-03 ENCOUNTER — Ambulatory Visit (HOSPITAL_COMMUNITY): Payer: Self-pay | Admitting: Family Medicine

## 2024-08-03 ENCOUNTER — Ambulatory Visit (HOSPITAL_COMMUNITY)
Admission: RE | Admit: 2024-08-03 | Discharge: 2024-08-03 | Disposition: A | Discharge status: Home / Self Care | Source: Ambulatory Visit | Attending: Family Medicine | Admitting: Family Medicine

## 2024-08-03 VITALS — BP 116/74 | HR 79 | Ht 66.0 in | Wt 223.8 lb

## 2024-08-03 DIAGNOSIS — M797 Fibromyalgia: Secondary | ICD-10-CM | POA: Insufficient documentation

## 2024-08-03 DIAGNOSIS — E782 Mixed hyperlipidemia: Secondary | ICD-10-CM | POA: Diagnosis not present

## 2024-08-03 DIAGNOSIS — I2510 Atherosclerotic heart disease of native coronary artery without angina pectoris: Secondary | ICD-10-CM | POA: Insufficient documentation

## 2024-08-03 DIAGNOSIS — I110 Hypertensive heart disease with heart failure: Secondary | ICD-10-CM | POA: Insufficient documentation

## 2024-08-03 DIAGNOSIS — Z7902 Long term (current) use of antithrombotics/antiplatelets: Secondary | ICD-10-CM | POA: Insufficient documentation

## 2024-08-03 DIAGNOSIS — I255 Ischemic cardiomyopathy: Secondary | ICD-10-CM | POA: Insufficient documentation

## 2024-08-03 DIAGNOSIS — R0683 Snoring: Secondary | ICD-10-CM | POA: Diagnosis not present

## 2024-08-03 DIAGNOSIS — E1040 Type 1 diabetes mellitus with diabetic neuropathy, unspecified: Secondary | ICD-10-CM | POA: Diagnosis not present

## 2024-08-03 DIAGNOSIS — I10 Essential (primary) hypertension: Secondary | ICD-10-CM | POA: Diagnosis not present

## 2024-08-03 DIAGNOSIS — Z79899 Other long term (current) drug therapy: Secondary | ICD-10-CM | POA: Diagnosis not present

## 2024-08-03 DIAGNOSIS — I5022 Chronic systolic (congestive) heart failure: Secondary | ICD-10-CM | POA: Insufficient documentation

## 2024-08-03 DIAGNOSIS — Z9641 Presence of insulin pump (external) (internal): Secondary | ICD-10-CM | POA: Insufficient documentation

## 2024-08-03 DIAGNOSIS — R002 Palpitations: Secondary | ICD-10-CM | POA: Diagnosis not present

## 2024-08-03 DIAGNOSIS — Z955 Presence of coronary angioplasty implant and graft: Secondary | ICD-10-CM | POA: Diagnosis not present

## 2024-08-03 DIAGNOSIS — E785 Hyperlipidemia, unspecified: Secondary | ICD-10-CM | POA: Diagnosis not present

## 2024-08-03 DIAGNOSIS — Z6836 Body mass index (BMI) 36.0-36.9, adult: Secondary | ICD-10-CM | POA: Diagnosis not present

## 2024-08-03 DIAGNOSIS — E669 Obesity, unspecified: Secondary | ICD-10-CM | POA: Insufficient documentation

## 2024-08-03 DIAGNOSIS — Z8616 Personal history of COVID-19: Secondary | ICD-10-CM | POA: Insufficient documentation

## 2024-08-03 LAB — BASIC METABOLIC PANEL WITH GFR
Anion gap: 10 (ref 5–15)
BUN: 14 mg/dL (ref 6–20)
CO2: 24 mmol/L (ref 22–32)
Calcium: 9.2 mg/dL (ref 8.9–10.3)
Chloride: 106 mmol/L (ref 98–111)
Creatinine, Ser: 0.82 mg/dL (ref 0.44–1.00)
GFR, Estimated: >60 mL/min
Glucose, Bld: 125 mg/dL — ABNORMAL HIGH (ref 70–99)
Potassium: 4.6 mmol/L (ref 3.5–5.1)
Sodium: 140 mmol/L (ref 135–145)

## 2024-08-03 NOTE — Progress Notes (Signed)
"   ReDS Vest / Clip - 08/03/24 0847       ReDS Vest / Clip   Station Marker D    Ruler Value 41    ReDS Value Range Low volume    ReDS Actual Value 28          "

## 2024-08-03 NOTE — Patient Instructions (Addendum)
 Good to see you today!  You have been referred to healthy weight and wellness they will call to schedule an appointment  Labs done today, your results will be available in MyChart, we will contact you for abnormal readings.  Your physician recommends that you schedule a follow-up appointment 6 months(July ) Call office in may to schedule an appointment  If you have any questions or concerns before your next appointment please send us  a message through [place] or call our office at [phone].    TO LEAVE A MESSAGE FOR THE NURSE SELECT OPTION 2, PLEASE LEAVE A MESSAGE INCLUDING: YOUR NAME DATE OF BIRTH CALL BACK NUMBER REASON FOR CALL**this is important as we prioritize the call backs  YOU WILL RECEIVE A CALL BACK THE SAME DAY AS LONG AS YOU CALL BEFORE 4:00 PM At the [facility], you and your health needs are our priority. As part of our continuing mission to provide you with exceptional heart care, we have created designated Provider Care Teams. These Care Teams include your primary Cardiologist (physician) and Advanced Practice Providers (APPs- Physician Assistants and Nurse Practitioners) who all work together to provide you with the care you need, when you need it.   You may see any of the following providers on your designated Care Team at your next follow up: Dr Toribio Fuel Dr Ezra Shuck Dr. Morene Brownie Greig Mosses, NP Caffie Shed, [place] [employer] [place], [place] Beckey Coe, NP Jordan Lee, NP Ellouise Class, NP Tinnie Redman, PharmD Jaun Bash, PharmD   Please be sure to bring in all your medications bottles to every appointment.    Thank you for choosing [facility]

## 2024-08-03 NOTE — Addendum Note (Signed)
 Encounter addended by: Glena Harlene HERO, FNP on: 08/03/2024 12:20 PM  Actions taken: Visit diagnoses modified, Clinical Note Signed

## 2024-08-09 ENCOUNTER — Other Ambulatory Visit (HOSPITAL_COMMUNITY): Payer: Self-pay | Admitting: Internal Medicine

## 2024-08-26 ENCOUNTER — Other Ambulatory Visit: Payer: Self-pay | Admitting: Student

## 2024-08-26 DIAGNOSIS — E1059 Type 1 diabetes mellitus with other circulatory complications: Secondary | ICD-10-CM

## 2024-08-28 ENCOUNTER — Other Ambulatory Visit: Payer: Self-pay | Admitting: Physician Assistant

## 2024-08-29 ENCOUNTER — Other Ambulatory Visit: Payer: Self-pay | Admitting: Student

## 2024-08-30 ENCOUNTER — Other Ambulatory Visit: Payer: Self-pay | Admitting: Student

## 2024-08-30 ENCOUNTER — Telehealth: Payer: Self-pay

## 2024-08-30 NOTE — Telephone Encounter (Signed)
 Prior Authorization for patient (Dexcom G7 Sensor) came through on cover my meds was submitted with last office notes and labs awaiting approval or denial.  XZB:[id number]

## 2024-08-30 NOTE — Telephone Encounter (Signed)
 Prior authorization has been submitted awaiting approval or denial.

## 2024-08-30 NOTE — Telephone Encounter (Signed)
 Amanda Frederick (Key: [id number]) PA Case ID #: [id number] Need Help? Call us  at [phone] Outcome Approved today by [facility] 2017 Approved. Effective Date: 08/30/2024 Authorization Expiration Date: 08/30/2027 Drug Dexcom G7 Sensor ePA cloud logo Form [employer] Electronic Request Form

## 2024-08-30 NOTE — Telephone Encounter (Signed)
 Medication sent to pharmacy

## 2024-08-31 ENCOUNTER — Telehealth: Payer: Self-pay

## 2024-08-31 NOTE — Telephone Encounter (Signed)
 Per Dr.Goodwin reach out to the patient to see if she needs a refill on her insulin  pump. I was unable to reach the patient. I lvm for her to give us  a call back.

## 2024-08-31 NOTE — Telephone Encounter (Signed)
 Attempt #2 unable to reach the patient.

## 2024-09-01 NOTE — Telephone Encounter (Signed)
 Attempt 3- lvm

## 2024-09-08 ENCOUNTER — Encounter: Admitting: Dietician

## 2024-09-20 ENCOUNTER — Ambulatory Visit: Admitting: Physician Assistant
